# Patient Record
Sex: Female | Born: 1965 | Race: White | Hispanic: No | Smoking: Former smoker
Health system: Western US, Academic
[De-identification: ages and names within clinical notes are randomized; demographics above are authoritative.]

## PROBLEM LIST (undated history)

## (undated) DIAGNOSIS — G4700 Insomnia, unspecified: Secondary | ICD-10-CM

## (undated) DIAGNOSIS — B2790 Infectious mononucleosis, unspecified without complication: Principal | ICD-10-CM

## (undated) DIAGNOSIS — E039 Hypothyroidism, unspecified: Secondary | ICD-10-CM

## (undated) DIAGNOSIS — N920 Excessive and frequent menstruation with regular cycle: Secondary | ICD-10-CM

## (undated) HISTORY — DX: Insomnia, unspecified: G47.00

## (undated) HISTORY — DX: Hypothyroidism, unspecified: E03.9

## (undated) HISTORY — DX: Excessive and frequent menstruation with regular cycle: N92.0

## (undated) HISTORY — PX: OTHER PROCEDURE: U1053

## (undated) HISTORY — DX: Infectious mononucleosis, unspecified without complication: B27.90

## (undated) HISTORY — PX: PB MYOMECTOMY 1-4 MYOMAS 250 GM/< VAGINAL APPR: 58145

## (undated) MED ORDER — CYCLOBENZAPRINE HCL 5 MG OR TABS
5.00 mg | ORAL_TABLET | Freq: Three times a day (TID) | ORAL | 0 refills | Status: AC | PRN
Start: 2022-08-02 — End: ?

## (undated) MED ORDER — LEVONORGESTREL-ETHINYL ESTRAD 0.1-20 MG-MCG OR TABS
1.00 | ORAL_TABLET | Freq: Every day | ORAL | 3 refills | Status: AC
Start: 2017-07-30 — End: ?

## (undated) MED ORDER — ROSUVASTATIN CALCIUM 10 MG OR TABS
10.0000 mg | ORAL_TABLET | Freq: Every day | ORAL | 3 refills | Status: AC
Start: 2021-07-19 — End: ?

## (undated) MED ORDER — SYNTHROID 100 MCG OR TABS
100.00 ug | ORAL_TABLET | ORAL | 2 refills | Status: AC
Start: 2021-09-16 — End: ?

## (undated) MED ORDER — LEVONORGESTREL-ETHINYL ESTRAD 0.1-20 MG-MCG OR TABS
1.00 | ORAL_TABLET | Freq: Every day | ORAL | Status: AC
Start: 2017-08-08 — End: ?

## (undated) MED ORDER — SYNTHROID 125 MCG OR TABS
ORAL_TABLET | ORAL | 0 refills | Status: AC
Start: 2017-01-22 — End: ?

## (undated) MED ORDER — CYCLOBENZAPRINE HCL 5 MG OR TABS
5.0000 mg | ORAL_TABLET | Freq: Three times a day (TID) | ORAL | 0 refills | Status: AC | PRN
Start: 2019-08-03 — End: ?

## (undated) MED ORDER — SYNTHROID 112 MCG OR TABS
ORAL_TABLET | ORAL | 0 refills | Status: AC
Start: 2017-01-22 — End: ?

## (undated) MED ORDER — LEVOTHYROXINE SODIUM 125 MCG OR TABS
ORAL_TABLET | ORAL | 2 refills | Status: AC
Start: 2019-02-03 — End: ?

## (undated) MED ORDER — DESVENLAFAXINE SUCCINATE 50 MG OR TB24
50.00 mg | ORAL_TABLET | Freq: Every day | ORAL | 3 refills | Status: AC
Start: 2020-01-01 — End: ?

## (undated) MED ORDER — DESVENLAFAXINE SUCCINATE ER 25 MG PO TB24
25.00 mg | ORAL_TABLET | Freq: Every day | ORAL | 0 refills | Status: AC
Start: 2021-11-16 — End: ?

## (undated) MED ORDER — MEBENDAZOLE 100 MG OR CHEW
CHEWABLE_TABLET | ORAL | 0 refills | Status: AC
Start: 2017-09-18 — End: ?

## (undated) MED ORDER — SYNTHROID 112 MCG OR TABS
112.00 ug | ORAL_TABLET | ORAL | 2 refills | Status: AC
Start: 2021-09-17 — End: ?

## (undated) MED ORDER — AZELASTINE HCL 137 MCG/SPRAY NA SOLN
NASAL | 0 refills | Status: AC
Start: 2022-03-05 — End: ?

## (undated) MED ORDER — SYNTHROID 112 MCG OR TABS
112.00 ug | ORAL_TABLET | ORAL | 0 refills | Status: AC
Start: 2021-12-24 — End: ?

## (undated) MED ORDER — SYNTHROID 100 MCG OR TABS
100.00 ug | ORAL_TABLET | Freq: Every day | ORAL | Status: AC
Start: 2020-04-20 — End: ?

## (undated) MED ORDER — IBUPROFEN 800 MG OR TABS
800.00 mg | ORAL_TABLET | Freq: Three times a day (TID) | ORAL | 0 refills | Status: AC | PRN
Start: 2017-10-14 — End: ?

## (undated) MED ORDER — LEVOTHYROXINE SODIUM 112 MCG OR TABS
ORAL_TABLET | ORAL | 2 refills | Status: AC
Start: 2019-02-03 — End: ?

## (undated) MED ORDER — DESVENLAFAXINE SUCCINATE ER 25 MG PO TB24
25.0000 mg | ORAL_TABLET | Freq: Every day | ORAL | 3 refills | Status: AC
Start: 2021-05-16 — End: ?

## (undated) MED ORDER — DESVENLAFAXINE SUCCINATE 50 MG OR TB24
50.0000 mg | ORAL_TABLET | Freq: Every day | ORAL | 3 refills | Status: AC
Start: 2021-05-16 — End: ?

## (undated) MED ORDER — SYNTHROID 100 MCG OR TABS
100.00 ug | ORAL_TABLET | ORAL | 0 refills | Status: AC
Start: 2021-12-24 — End: ?

## (undated) MED ORDER — LEVONORGESTREL-ETHINYL ESTRAD 0.1-20 MG-MCG OR TABS
1.00 | ORAL_TABLET | Freq: Every day | ORAL | 3 refills | Status: AC
Start: 2017-12-12 — End: ?

## (undated) MED ORDER — SYNTHROID 88 MCG OR TABS
88.00 ug | ORAL_TABLET | Freq: Every day | ORAL | 0 refills | Status: AC
Start: 2020-07-13 — End: ?

## (undated) MED ORDER — LEVOTHYROXINE SODIUM 88 MCG OR TABS
88.00 ug | ORAL_TABLET | ORAL | 3 refills | Status: AC
Start: 2023-01-02 — End: ?

## (undated) MED ORDER — DICLOFENAC SODIUM 1 % EX GEL
4.00 g | Freq: Four times a day (QID) | CUTANEOUS | 3 refills | Status: AC
Start: 2020-02-24 — End: ?

## (undated) MED ORDER — DESVENLAFAXINE SUCCINATE ER 25 MG PO TB24
ORAL_TABLET | ORAL | 0 refills | Status: AC
Start: 2021-08-21 — End: ?

---
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
## 1993-05-20 HISTORY — PX: CHOLECYSTECTOMY, LAP: 56340

## 1996-05-20 HISTORY — PX: OTHER PROCEDURE: U1053

## 2003-03-29 ENCOUNTER — Other Ambulatory Visit (INDEPENDENT_AMBULATORY_CARE_PROVIDER_SITE_OTHER): Payer: Self-pay | Admitting: Obstetrics & Gynecology

## 2003-06-10 ENCOUNTER — Other Ambulatory Visit (INDEPENDENT_AMBULATORY_CARE_PROVIDER_SITE_OTHER): Payer: Self-pay

## 2003-08-10 ENCOUNTER — Emergency Department (HOSPITAL_BASED_OUTPATIENT_CLINIC_OR_DEPARTMENT_OTHER): Admitting: Emergency Medicine

## 2003-09-19 ENCOUNTER — Other Ambulatory Visit (INDEPENDENT_AMBULATORY_CARE_PROVIDER_SITE_OTHER): Payer: Self-pay

## 2003-09-19 LAB — COMPREHENSIVE METABOLIC PANEL, BLOOD
ALT (SGPT): 26 [IU]/L (ref 10–45)
AST (SGOT): 20 [IU]/L (ref 10–45)
Albumin: 4 g/dL (ref 3.3–5.0)
Alkaline Phos: 36 [IU]/L (ref 30–130)
BUN: 13 mg/dL (ref 8–18)
Bicarbonate: 28 meq/L (ref 24–31)
Bilirubin, Tot: 1.7 mg/dL — ABNORMAL HIGH (ref <1.2)
Calcium: 9.2 mg/dL (ref 8.8–10.3)
Chloride: 104 meq/L (ref 97–107)
Creatinine: 0.9 mg/dL (ref 0.5–1.5)
Glucose: 102 mg/dL (ref 65–110)
Potassium: 3.7 meq/L (ref 3.5–5.0)
Sodium: 142 meq/L (ref 135–145)
Total Protein: 6.8 g/dL (ref 6.0–8.0)

## 2003-09-19 LAB — TSH SENSITIVE, BLOOD: TSH Sensitive: 0.63 u[IU]/mL (ref 0.35–5.50)

## 2003-09-19 LAB — CBC WITH DIFF, BLOOD
Basophils: 1 % (ref 0–2)
Eosinophils: 2 % (ref 1–3)
Hct: 37.1 % (ref 36.0–46.0)
Hgb: 13.1 g/dL (ref 12.0–16.0)
Lymphocytes: 46 % — ABNORMAL HIGH (ref 20–40)
MCH: 32.8 pg — ABNORMAL HIGH (ref 27–31)
MCHC: 35.2 % (ref 32–37)
MCV: 93.1 um3 (ref 82.0–98.0)
MPV: 10.9 fL — ABNORMAL HIGH (ref 7.4–10.4)
Monocytes: 9 % (ref 1–10)
Plt Count: 190 10*3/uL (ref 130–400)
RBC: 3.99 10*6/uL — ABNORMAL LOW (ref 4.00–5.00)
RDW: 12.3 % (ref 10–15)
Segs: 42 % — ABNORMAL LOW (ref 45–70)
WBC: 4.7 10*3/uL (ref 4.0–11.0)

## 2003-09-19 LAB — LIPID(CHOL FRACT) PANEL, BLOOD
Cholesterol: 230 mg/dL — ABNORMAL HIGH (ref 120–200)
HDL-Cholesterol: 69 mg/dL (ref ?–85)
LDL-Chol (Direct): 155 mg/dL (ref <160)
Triglycerides: 63 mg/dL (ref ?–150)

## 2004-03-15 ENCOUNTER — Ambulatory Visit (INDEPENDENT_AMBULATORY_CARE_PROVIDER_SITE_OTHER): Admitting: Obstetrics & Gynecology

## 2004-03-28 NOTE — Lab (Signed)
 SPECIMENS SUBMITTED:  Cervical (Pap) Smear     CLINICAL INFORMATION:  LMP: 03/15/2004  FINAL CYTOLOGIC INTERPRETATION:  Atypical endocervical glandular cells     SPECIMEN ADEQUACY:  Satisfactory for evaluation  The Pap smear is a screening test with an inherent low error rate. Although  a normal [negative] result is highly predictive of the absence of cervical  cancer and its precursor lesions, it does not exclude the presence of  significant disease. Results must be evaluated within the context of the  individual patient.  CONFIDENTIAL HEALTH INFORMATION: Health Care information is personal and  sensitive information. If it is being faxed to you it is done so under  appropriate authorization from the patient or under circumstances that do  not require patient authorization. You, the recipient, are obligated to  maintain it in a safe, secure and confidential manner. Re-disclosure  without additional patient consent or as permitted by law is prohibited.  Unauthorized re-disclosure or failure to maintain confidentiality could  subject you to penalties described in federal and state law. If you have  received this report or facsimile in error, please notify the [facility] immediately and destroy the received document(s).   Material reviewed and Interpreted and  Report Electronically Signed by:  Coralyn Derry. Barnett Libel M.D. [phone])  Attending Surgical Pathologist  03/28/04 08:48  Electronic Signature derived from a single  controlled access password

## 2004-05-10 ENCOUNTER — Ambulatory Visit (INDEPENDENT_AMBULATORY_CARE_PROVIDER_SITE_OTHER): Admitting: Obstetrics & Gynecology

## 2004-05-17 NOTE — Lab (Signed)
 FINAL PATHOLOGIC DIAGNOSIS:  A: Uterus, endocervix, curettage   -Mucus and benign ectocervical tissue with squamous metaplasia, see   comment.  B: Uterus, endometrium, biopsy   -Benign secretory endometrium, see comment.  COMMENT: This case was reviewed and correlated with concurrent Pap smear  ([id number], negative reactive) and prior Pap smear ([id number],  atypical endocervical glandular cells).     SPECIMEN(S) SUBMITTED:  A: Endocervical curettage  B: endometrial biopsy  CLINICAL HISTORY: 38 year old female with atypical endocervical cells on  Pap. Negative colposcopy. Last menstrual period 04/16/04. Patient is not  pregnant. Patient has had no hormone therapy. Concurrent Pap is taken.     GROSS DESCRIPTION:  A: The specimen (received in formalin, labeled with the patient's name,  medical record number and "ECC") consists of a Telfa pad and cytobrush with  attached red-tan fragments of soft tissue. The fragments are removed,  filtered into a mesh bag and measure in aggregate 1.2 x 1.0 x 0.2 cm. The  specimen is entirely submitted in cassette A1.  B: The specimen (received in formalin, labeled with the patient's name,  medical record number and "EMB") consists of multiple red-tan fragments of  soft tissue filtered into a mesh bag and measure in aggregate 2.5 x 2.0 x  0.2 cm. The specimen is entirely submitted in cassette B1.  SD/JK/jb  CONFIDENTIAL HEALTH INFORMATION: Health Care information is personal and  sensitive information. If it is being faxed to you it is done so under  appropriate authorization from the patient or under circumstances that do  not require patient authorization. You, the recipient, are obligated to  maintain it in a safe, secure and confidential manner. Re-disclosure  without additional patient consent or as permitted by law is prohibited.  Unauthorized re-disclosure or failure to maintain confidentiality could  subject you to penalties described in federal and state law. If you  have  received this report or facsimile in error, please notify the [facility] immediately and destroy the received document(s).  Vic Ripper GYN M/C [phone]   Material reviewed and Interpreted and  Report Electronically Signed by:  Ruffin Frederick. Gwyneth Sprout M.D. [phone])  Attending Surgical Pathologist  05/17/04 11:02  Electronic Signature derived from a single  controlled access password

## 2004-05-17 NOTE — Lab (Signed)
 SPECIMENS SUBMITTED:  Cervical (Pap) Smear     CLINICAL INFORMATION:  LMP: 04/16/2004 Other Clinical Findings: Repeat pap  FINAL CYTOLOGIC INTERPRETATION:  No Atypical or Malignant Cells  Reactive cellular changes     COMMENT:  Previous cytology reviewed  Previous and/or concurrent surgical material reviewed     SPECIMEN ADEQUACY:  Satisfactory for evaluation; drying artifact present.  The Pap smear is a screening test with an inherent low error rate. Although  a normal [negative] result is highly predictive of the absence of cervical  cancer and its precursor lesions, it does not exclude the presence of  significant disease. Results must be evaluated within the context of the  individual patient.  CONFIDENTIAL HEALTH INFORMATION: Health Care information is personal and  sensitive information. If it is being faxed to you it is done so under  appropriate authorization from the patient or under circumstances that do  not require patient authorization. You, the recipient, are obligated to  maintain it in a safe, secure and confidential manner. Re-disclosure  without additional patient consent or as permitted by law is prohibited.  Unauthorized re-disclosure or failure to maintain confidentiality could  subject you to penalties described in federal and state law. If you have  received this report or facsimile in error, please notify the [facility]  [facility] Department immediately and destroy the received document(s).   Material reviewed and Interpreted and  Report Electronically Signed by:  Ruffin Frederick. Gwyneth Sprout M.D. [phone])  Attending Surgical Pathologist  05/17/04 11:03  Electronic Signature derived from a single  controlled access password

## 2005-06-27 ENCOUNTER — Encounter (INDEPENDENT_AMBULATORY_CARE_PROVIDER_SITE_OTHER): Payer: Self-pay | Admitting: Obstetrics & Gynecology

## 2005-06-27 ENCOUNTER — Ambulatory Visit (INDEPENDENT_AMBULATORY_CARE_PROVIDER_SITE_OTHER): Admitting: Obstetrics & Gynecology

## 2005-08-15 ENCOUNTER — Ambulatory Visit (INDEPENDENT_AMBULATORY_CARE_PROVIDER_SITE_OTHER): Admitting: Otolaryngology

## 2005-09-13 ENCOUNTER — Ambulatory Visit (INDEPENDENT_AMBULATORY_CARE_PROVIDER_SITE_OTHER): Admitting: Audiology

## 2005-09-19 ENCOUNTER — Ambulatory Visit (INDEPENDENT_AMBULATORY_CARE_PROVIDER_SITE_OTHER): Admitting: Otolaryngology

## 2005-09-25 ENCOUNTER — Ambulatory Visit (HOSPITAL_BASED_OUTPATIENT_CLINIC_OR_DEPARTMENT_OTHER)

## 2005-10-04 ENCOUNTER — Ambulatory Visit (INDEPENDENT_AMBULATORY_CARE_PROVIDER_SITE_OTHER): Admitting: Dermatology

## 2005-10-17 ENCOUNTER — Other Ambulatory Visit (HOSPITAL_BASED_OUTPATIENT_CLINIC_OR_DEPARTMENT_OTHER): Payer: Self-pay

## 2005-11-21 ENCOUNTER — Encounter (INDEPENDENT_AMBULATORY_CARE_PROVIDER_SITE_OTHER): Admitting: Otolaryngology

## 2005-12-04 NOTE — Progress Notes (Signed)
CLINIC: [facility]    REPORT TYPE: LETTER    Dictating Practitioner: Francee Piccolo, MD    DATE OF SERVICE: 10/04/2005    REASON FOR VISIT: NEVI, PAPULE      Oct 04, 2005    Darrick Penna, M.D.  [facility]  [id number]  [place], [place] [address]    Re: Brittany [name]    Dear Dr. Cathie Hoops:    I saw your patient, Brittany [name], in Dermatology. She has a history of  numerous nevi. Many of these on the back appear to be slightly atypical.  Only three nevi met criteria for biopsy today. These were on the left  buttock, the right mid back, and mid pubic area.    She also had a pearly papule on the right nasal ala, concerning for adnexal  tumor versus basal cell carcinoma versus intradermal nevus. This was also  biopsied today.    Because of the number of nevi she has, I recommended self skin examinations  and monitoring for changes. She should return to see me in six months for  full body skin exam.    Thank you for this consultation. The full note is in the paper chart.  Feel free to contact me with any further questions.    Sincerely,        Sharolyn Douglas, M.D.                        Signature Derived From Controlled Access Password  Francee Piccolo, MD 12/04/2005 18:01        DD: 10/04/2005 DT: 10/06/2005 9:33 P DocNo.: [id number]  [id number]/[id number]    Referring Physician:  Reuel Derby MD  [facility] [facility]      Primary Care Physician:  Reuel Derby MD  [facility] [facility]      cc: Ezekiel Slocumb, M.D.   Fax Recipient   [address] [place] [address]

## 2006-03-25 ENCOUNTER — Encounter (INDEPENDENT_AMBULATORY_CARE_PROVIDER_SITE_OTHER): Admitting: Dermatology

## 2006-04-16 ENCOUNTER — Telehealth (INDEPENDENT_AMBULATORY_CARE_PROVIDER_SITE_OTHER): Payer: Self-pay

## 2006-04-16 NOTE — Telephone Encounter (Addendum)
Former Dr. Juel Burrow patient stating Dr. Cathie Hoops is her primary now.    Verbally confirmed name of Primary Care Provider: yes    What is reason for call: Upset stomach, funny feeling starting today and has not vomited.  Has been having a cold for 2 months, chills, occasional headaches.    Advise patient due to late hour patient might not receive a call back until tomorrow morning.    Confirmed Contact Number:yes    This message will be transmitted to our triage nurse, you can expect a call by the end of the working day.

## 2006-04-16 NOTE — Telephone Encounter (Signed)
RN spoke to pt. Complains of cold sx and congestion x 2 months. Today, she started with chills, mid epigastric pain, ST that has resolved. Denies fever. Complains of slight diarrhea. Denies right shoulder pain, but endorses fatigue and malaise.     States she was assigned Dr. Cathie Hoops when Dr Juel Burrow left, she just never est care.    Scheduled to see Dr. Suzan Garibaldi tomorrow at 1520 and instructed her to schedule an est care appt with Dr. Pattricia Boss on her way out. Pt in agreement and verbalized understanding.

## 2006-04-17 ENCOUNTER — Other Ambulatory Visit (INDEPENDENT_AMBULATORY_CARE_PROVIDER_SITE_OTHER): Payer: Self-pay

## 2006-04-17 ENCOUNTER — Ambulatory Visit (INDEPENDENT_AMBULATORY_CARE_PROVIDER_SITE_OTHER)

## 2006-04-17 MED ORDER — SEPTRA DS 800-160 MG OR TABS
ORAL_TABLET | ORAL | Status: AC
Start: 2006-04-17 — End: 2006-04-24

## 2006-04-17 MED ORDER — ROBITUSSIN A-C 10-100 MG/5ML OR SYRP
ORAL_SOLUTION | ORAL | Status: DC
Start: 2006-04-17 — End: 2006-05-08

## 2006-04-17 MED ORDER — AFRIN SINUS 0.05 % NA SOLN
NASAL | Status: DC
Start: 2006-04-17 — End: 2006-05-08

## 2006-04-17 NOTE — Patient Instructions (Addendum)
Pls give blood today, you will be notified if any abnormality     Please return or call immediately for worsening or persistent fever, cough, shortness of breath, facial pain, ear pain, sore throat, or chest pain.  Also return or call for any new symptoms.

## 2006-04-17 NOTE — Progress Notes (Signed)
Brittany [name] is a 40 year old female generally healthy denies chronic med problems here w/ 2 mo of intermittent uri sx and congestion, yesterday had chils, diarrhea, and epigastiric pain.  These abdominal sx have resolved as of today, she had a normal bm today and ate her usual diet.  Her uri sx have mainly been nasal congestion, postnasal drip, cough, some ST, ear pain.  She has had occasional chills over the 2 mo period but nothing consistent  No SOB or pleuritic CP.  No h/o RAD or COPD, no tob use.  She works in close proximity to several people who have had uri and gi sx.  She has not had chronic f/c/ns/wt loss.  She has had about 10# wt gain over the last yr.  No other acute c/o.  ROS - as per HPI     There is no problem list on file for this patient.   No past medical history on file.   No current outpatient prescriptions on file.     BP 118/76  Pulse 72  Temp (Src) 97.8 F (36.6 C) (Oral)  Wt 172 lb (78.02 kg)   pleasant NAD  No frontal or maxillary area TTP  TMs and EAC wnl bilaterally  Nares clear  OP w/ erythema posteriorly; no exudates or swelling; uvula midline  No cervical LAD  Lungs CTAB w/o WRR  CV RRR  Extr w/o CCE  Abd:   NABS, no HSM by percussion, no suprapubic dullness to percussion;  soft, NTND, no rebound or guarding;  Murphy's negative, no tenderness over McBurney's point, no masses appreciated; no CVAT     Encounter Diagnoses   Code Name Primary? Qualifier   . 473.9 Sinusitis      Plan: SEPTRA DS 800-160 MG OR TABS, ROBITUSSIN A-C 10-100 MG/5ML OR SYRP, AFRIN SINUS 0.05 % NA SOLN   . 558.9 Gastroenteritis      Comment:  resolved     given the chronicity of sx will rx course of abx; also will check screening labs, and f/u to advise on further care as indicated.  Return precautions given.

## 2006-04-24 ENCOUNTER — Telehealth (INDEPENDENT_AMBULATORY_CARE_PROVIDER_SITE_OTHER): Payer: Self-pay | Admitting: Internal Medicine

## 2006-05-05 ENCOUNTER — Telehealth (INDEPENDENT_AMBULATORY_CARE_PROVIDER_SITE_OTHER): Payer: Self-pay

## 2006-05-08 ENCOUNTER — Ambulatory Visit (INDEPENDENT_AMBULATORY_CARE_PROVIDER_SITE_OTHER)

## 2006-05-08 ENCOUNTER — Other Ambulatory Visit (INDEPENDENT_AMBULATORY_CARE_PROVIDER_SITE_OTHER): Payer: Self-pay

## 2006-05-08 MED ORDER — CEFUROXIME AXETIL 250 MG OR TABS
ORAL_TABLET | ORAL | Status: DC
Start: 2006-05-08 — End: 2006-08-22

## 2006-05-08 MED ORDER — CLARITIN 10 MG OR TABS
ORAL_TABLET | ORAL | Status: DC
Start: 2006-05-08 — End: 2006-08-22

## 2006-05-14 ENCOUNTER — Telehealth (INDEPENDENT_AMBULATORY_CARE_PROVIDER_SITE_OTHER): Payer: Self-pay

## 2006-05-26 ENCOUNTER — Encounter (INDEPENDENT_AMBULATORY_CARE_PROVIDER_SITE_OTHER): Admitting: Dermatology

## 2006-06-02 ENCOUNTER — Telehealth (INDEPENDENT_AMBULATORY_CARE_PROVIDER_SITE_OTHER): Payer: Self-pay | Admitting: Internal Medicine

## 2006-06-02 NOTE — Telephone Encounter (Signed)
Pt states she would like recent labs (12.20.07) mailed to her home.  Pt saw Dr Suzan Garibaldi at that time.

## 2006-06-03 NOTE — Telephone Encounter (Signed)
Labs printed and mailed to patient per request.  Matter resolved.

## 2006-06-27 ENCOUNTER — Ambulatory Visit (INDEPENDENT_AMBULATORY_CARE_PROVIDER_SITE_OTHER): Admitting: Dermatology

## 2006-07-08 ENCOUNTER — Other Ambulatory Visit (INDEPENDENT_AMBULATORY_CARE_PROVIDER_SITE_OTHER): Payer: Self-pay | Admitting: Obstetrics & Gynecology

## 2006-08-20 ENCOUNTER — Telehealth (INDEPENDENT_AMBULATORY_CARE_PROVIDER_SITE_OTHER): Payer: Self-pay

## 2006-08-20 NOTE — Telephone Encounter (Signed)
Verbally confirmed name of Primary Care Provider: yes    What is reason for call: pt seen in Dec 07 for sinusitis, pt having similar symptoms-pt may have been exposed to mono    Confirmed Contact Number:yes    This message will be transmitted to our triage nurse, you can expect a call by the end of the working day.

## 2006-08-20 NOTE — Telephone Encounter (Signed)
RN spoke to pt. States her boss was dx with Mono recently. States since December, she has been having recurrent bouts of acute sinusitis and fatigue. States she is tired all of the time. She states the fatigue has been going on since October. States she has the start of a sore throat now, but denies any other sx. Denies epigastric/abd pain, but she does endorse diarrhea. Denies fever, but has not checked. Scheduled to see Carollee Herter CNP on Friday 08/22/06 at 1300. Pt in agreement and verbalized understanding.

## 2006-08-22 ENCOUNTER — Ambulatory Visit (INDEPENDENT_AMBULATORY_CARE_PROVIDER_SITE_OTHER): Admitting: Nurse Practitioner

## 2006-08-22 ENCOUNTER — Other Ambulatory Visit (INDEPENDENT_AMBULATORY_CARE_PROVIDER_SITE_OTHER): Payer: Self-pay

## 2006-08-22 ENCOUNTER — Telehealth (INDEPENDENT_AMBULATORY_CARE_PROVIDER_SITE_OTHER): Payer: Self-pay | Admitting: Nurse Practitioner

## 2006-08-22 ENCOUNTER — Other Ambulatory Visit (INDEPENDENT_AMBULATORY_CARE_PROVIDER_SITE_OTHER): Payer: Self-pay | Admitting: Internal Medicine

## 2006-08-22 VITALS — BP 110/60 | HR 92 | Temp 97.5°F | Resp 20 | Wt 173.0 lb

## 2006-08-22 NOTE — Patient Instructions (Signed)
Stop at the lab for blood work.   Follow up if symptoms worsen or persist.

## 2006-08-22 NOTE — Telephone Encounter (Signed)
Left MSG on VM, results for mono test are positive. Fluids and rest, Tylenol for sore throat and general body aches. Symptoms to resolve in the next 4-8 weeks. Call with any questions or concerns to [phone].

## 2006-08-22 NOTE — Progress Notes (Signed)
Brittany [name] is a 40y/o seen today for c/o fatigue and malaise. States she has been sick on and off since November with recurrent bouts of acute sinusitis and general fatigue. States her boss was dx with Mono recently. Current symptoms include fatigue, malaise, sore throat, chills, pt states she has not taken her temp. States she has been sleeping through her alarm recently. Denies epigastric/abd pain, does have occasional diarrhea. States she has been experiencing a lot of stress at work lately.    No past medical history on file.  No current outpatient prescriptions on file prior to encounter.       Physical Exam:   General Appearance: healthy, alert, no distress, pleasant affect, cooperative.  Eyes:  conjunctivae and corneas clear. PERRL, EOM's intact. Fundi benign.  Ears:  normal TMs and canal.  Nose:  normal.  Mouth: normal.  Neck:  Neck supple. No adenopathy, thyroid symmetric, normal size.  Heart:  normal rate and regular rhythm, no murmurs, clicks, or gallops.  Lungs: clear to auscultation and percussion, no chest deformities noted.  Abdomen: BS normal.  Abdomen soft, non-tender.  No masses or organomegaly.    A/P  Fatigue and malaise.   -mono spot and CBC  -If mono is negative we discuss further examination of current stress and anxiety. Pt states she has an appointment to establish with Dr. Pattricia Boss in a few weeks.

## 2006-08-27 ENCOUNTER — Telehealth (INDEPENDENT_AMBULATORY_CARE_PROVIDER_SITE_OTHER): Payer: Self-pay

## 2006-08-27 NOTE — Telephone Encounter (Signed)
Pt requesting copy of lab with mono diagnosis and note from NP [place] for her workplace which states her diagnosis. Please mail to home address.

## 2006-09-11 ENCOUNTER — Encounter (INDEPENDENT_AMBULATORY_CARE_PROVIDER_SITE_OTHER): Payer: Self-pay | Admitting: Internal Medicine

## 2006-09-11 ENCOUNTER — Ambulatory Visit (INDEPENDENT_AMBULATORY_CARE_PROVIDER_SITE_OTHER): Admitting: Internal Medicine

## 2006-09-19 ENCOUNTER — Other Ambulatory Visit (INDEPENDENT_AMBULATORY_CARE_PROVIDER_SITE_OTHER): Payer: Self-pay | Admitting: Internal Medicine

## 2006-09-24 ENCOUNTER — Telehealth (INDEPENDENT_AMBULATORY_CARE_PROVIDER_SITE_OTHER): Payer: Self-pay | Admitting: Internal Medicine

## 2006-09-24 NOTE — Telephone Encounter (Signed)
Phoned patient, informed that lab results was sent in the mail. Address verified correct. Patient was informed that per Dr Danae Orleans letter. All blood test results are normal.  Patient states that she is probably undergoing relapsed of her mono. Patient with good understanding to call back is there is any other Sx. Matter resolved.  FYI to Dr Pattricia Boss.

## 2006-09-24 NOTE — Telephone Encounter (Signed)
Patient had lab work done on Friday and would like Dr. Pattricia Boss to call her back to discuss results. She says she is not feeling very well.

## 2006-12-15 ENCOUNTER — Encounter (INDEPENDENT_AMBULATORY_CARE_PROVIDER_SITE_OTHER): Payer: Self-pay | Admitting: Internal Medicine

## 2006-12-15 ENCOUNTER — Other Ambulatory Visit (INDEPENDENT_AMBULATORY_CARE_PROVIDER_SITE_OTHER): Payer: Self-pay | Admitting: Internal Medicine

## 2007-01-26 NOTE — Telephone Encounter (Signed)
Elease Hashimoto and Rosey Bath, please route this to the appropriate person.  No routing on this message.

## 2007-01-26 NOTE — Telephone Encounter (Signed)
Addressed at visit with Dr. Pattricia Boss 09/11/06.  Matter resolved.

## 2007-02-04 ENCOUNTER — Encounter (INDEPENDENT_AMBULATORY_CARE_PROVIDER_SITE_OTHER): Payer: Self-pay | Admitting: Family Medicine

## 2007-02-04 ENCOUNTER — Ambulatory Visit (INDEPENDENT_AMBULATORY_CARE_PROVIDER_SITE_OTHER): Admitting: Family Medicine

## 2007-02-04 VITALS — BP 114/62 | Ht 70.0 in | Wt 171.0 lb

## 2007-02-04 DIAGNOSIS — Z0000 Encounter for general adult medical examination without abnormal findings: Secondary | ICD-10-CM

## 2007-02-04 NOTE — Progress Notes (Signed)
 Patient Name: Brittany [name]  MRN: [id number]  DOB: Jul 18, 1965  AGE: 41 year old    Patient's last menstrual period was 01/17/2007.    Patient Active Problem List   Diagnoses Date Noted   . Health Maintenance Examination [V70.9L] 02/04/2007     Last pap 2006, has had squamous cells, repeats normal  Mammogram: 2/08, all normal  CHOL 225  HDL65  LDLCALC      143    TRIG 87  (12/15/06)  Cycles: regular q28 days, last two cycles "heavier flow" "more cramping" and 1day longer (5 instead of 4)      . Infectious Mononucleosis [075] 09/11/2006       SURGERIES:   Past Surgical History   Procedure Date   . No past surgical history        MEDICATIONS: No current outpatient prescriptions  on file.       ALLERGIES: Review of patient's allergies indicates no known allergies.    OB/GYN HISTORY: Obstetric History     G0   P0   T0   P0   A0     E0   M0   L0          FAMILY HISTORY:   Family History   Problem Relation   . Ovarian Cancer Neg Hx     but sister w/ovarian cysts age 11   . Breast Cancer Mother       SOCIAL HISTORY: History   Social History   . Marital Status: Married     Spouse Name: N/A     Number of Children: N/A   . Years of Education: N/A   Social History Main Topics   . Tobacco Use: Never   . Alcohol Use: Yes      1 bottle wine /week   . Drug Use: No   . Sexually Active: Not Currently   Other Topics Concern   . Not on file   Social History Narrative   . No narrative on file     ROS: NO fevers/ chills/ nausea/ vomiting/ abdominal pain/ diarrhea/constipation/ URI sxs/ chest pain/ SOB/ DOE/ swelling/ rashes/ HA/ irregular bleeding/ vaginal discharge/ urinary sxs/ melena/hematochezia/change in bowel mvmts    EXAMINATION:   BP 114/62  Ht 5\' 10"  (1.778 m)  Wt 171 lb (77.565 kg)  LMP 01/17/2007  HEENT/THYROID : thyroid  normal, no adenopathy, supple  BREASTS: no axiallary LAD, no lymphadenopathy, no skin changes, diffuse fibrocystic changes, no dominant masses, symmetrical; no nipple discharge, normal  CHEST/LUNGS:  normal, clear to auscultation  HEART: normal  ABDOMEN:  BS normal, non-tender, no masses, no HSM   BACK: normal, no pain  PELVIC:  VULVA/VAGINA:  normal, discharge normal and physiologic  CERVIX: normal in appearance, no lesions or masses, S-C junction readily seen, friable  UTERUS: normal sized and mid  ADNEXA: no adnexal masses or tenderness  RECTAL: not performed  EXTREMITIES: Normal and No cyanosis, clubbing    ASSESSMENT:   well woman  no contraindication to continue hormonal therapy    Plan:   Encounter Diagnoses   Code Name Primary? Qualifier   . V72.31 Routine Gynecological Examination Yes     Plan: CYTOPATH GYN     Overview of Plan: Pap:  Done    PATIENT INSTRUCTED: mammography, self breast exam, follow-up if menses remain heavy/more painful.   Heavier menses: - advised EMB/Pelvic US , pt has had in past (12/05), declines today, advised to watch cycles, if persistently heavier, or more painful, to call  for the above.  She agrees -states "it's because I'm not exercising enough, I've been through this before"  PATIENT BARRIERS TO LEARNING: none  PATIENT/FAMILY UNDERSTANDING: verbalizes    FOLLOW-UP: Return in 1 year or sooner if problems occur.  Authored by: Jacquetta Mattocks, MD

## 2007-02-10 ENCOUNTER — Encounter (INDEPENDENT_AMBULATORY_CARE_PROVIDER_SITE_OTHER): Payer: Self-pay | Admitting: Gastroenterology

## 2007-02-10 ENCOUNTER — Ambulatory Visit (INDEPENDENT_AMBULATORY_CARE_PROVIDER_SITE_OTHER): Admitting: Gastroenterology

## 2007-02-10 VITALS — BP 128/78 | HR 72 | Temp 98.9°F | Resp 12 | Wt 170.0 lb

## 2007-02-10 NOTE — Progress Notes (Signed)
This is a 41 year old female, who presents for evaluation of fatigue, mild sore throat, body aches x 2 days. Was Dx'ed via monospot w/Mono in April. Her Sx at that time improved but now she feels like she is starting with the same sort of prodrome as before. Says that she feels tired all the time and her throat has been a little sore for the past two days. Feels like she is "out of it". Appetite has been fine, no weight loss. Denies cough, CP, SOB, abd pain, N/V/D. No swollen glands that she has noticed. Denies fever but has felt some chills.      Past Medical History   Diagnosis Date    Infectious Mononucleosis          No Known Allergies.      No current outpatient prescriptions on file prior to encounter.             ROS--    Denies a history of HA, ear pain, tinnitus or aural discharge.    No nasal discharge, coryza or bleeding. No sinus pain.    Patient refers mild sore throat, no dental pain, hoarseness, dysphagia, oral or tongue lesions.    The patient denies cough, chest pain, SOB, wheezing or hemoptysis.    Patient denies any exertional chest pain, DOE, palpitations, syncope, orthopnea, edema or paroxysmal nocturnal dyspnea.    The patient denies abdominal or flank pain, anorexia, nausea or vomiting, dysphagia, or weight loss.    The patient denies swelling, numbness, tingling or weakness in the extremities.    Denies symptoms of joint swelling or back pain.    Denies swollen glands in neck, axillary or inguinal areas, no night sweats or pruritus.    The patient denies dysuria, frequency, hesitancy or hematuria.    The patient denies any symptoms of neurological impairment or TIAs; no amaurosis, diplopia, dysphasia, or unilateral disturbance of motor or sensory function. No loss of balance or vertigo.      PE--    Vital signs per nursing.    GEN: appears well, in no apparent distress.  Alert and oriented times three, pleasant and cooperative.    HEENT: PERLA, EOMI, no JVD, no LAN, neck supple, thyroid  nonpalpable    LUNGS: Chest is clear, no wheezing or rales. Normal symmetric air entry throughout both lung fields. No chest wall deformities or tenderness.    CV:  S1 and S2 normal, no murmurs, clicks, gallops or rubs. Regular rate and rhythm.     ABD: The abdomen is soft without tenderness, guarding, mass, rebound or organomegaly. Bowel sounds are normal. No CVA tenderness or inguinal adenopathy noted.    Extremities: no pedal edema, no cyanosis or clubbing.    IMP / PLAN:  Encounter Diagnoses   Code Name Primary? Qualifier    780.79 Fatigue - ? Etiology. May have a new viral URI developing vs. protracted mono. Will check cbcd, metabolic today. Will also check EBV titers. Advised symptomatic Tx with tylenol, rest, good hydration. If no improvement in the next few weeks she will f/u with PCP. Of note, TSH was wnl in May 2008. Will not repeat at this time. Yes     Plan: CBC WITH ADIFF, BLOOD, COMPREHENSIVE METABOLIC PANEL, BLOOD, EPSTEIN-BARR IGG ANTIBODY, BLOOD, EPSTEIN-BARR IGM ANTIBODY,

## 2007-02-10 NOTE — Progress Notes (Addendum)
AVS instruction given to PT with good understanding.

## 2007-02-13 ENCOUNTER — Telehealth (INDEPENDENT_AMBULATORY_CARE_PROVIDER_SITE_OTHER): Payer: Self-pay | Admitting: Internal Medicine

## 2007-02-13 NOTE — Telephone Encounter (Signed)
 Dr Myles Arvin, please advise of test results.  Thanks.

## 2007-02-13 NOTE — Telephone Encounter (Signed)
 Patient requesting to discuss her lab results when they become available, please advise.

## 2007-02-13 NOTE — Telephone Encounter (Signed)
 Blood count and chemistries are normal.  Epstein-Barr antibody titers are not back yet.

## 2007-02-16 NOTE — Telephone Encounter (Signed)
 Dr Myles Arvin, please advise of other final results for Epstein-Barr antibody titers. Thanks.

## 2007-02-16 NOTE — Telephone Encounter (Signed)
 Attempted to call the pt. by phone with the following result;  left message on machine to call back.

## 2007-02-16 NOTE — Telephone Encounter (Signed)
 Epstein-Barr IGG antibody is high with the IGM antibody being negative.  This means that there was infection with EBV in the past, though not necessarily recently.

## 2007-02-18 NOTE — Telephone Encounter (Signed)
Letter written and forwarded.

## 2007-02-18 NOTE — Telephone Encounter (Signed)
 Letter mailed. Will closed this encounter.

## 2007-02-18 NOTE — Telephone Encounter (Signed)
 Attempted to call the pt. by phone with the following result;  left message on machine to call back.  Dr Myles Arvin, can we mail patient a letter? Thanks.

## 2007-03-20 ENCOUNTER — Encounter (INDEPENDENT_AMBULATORY_CARE_PROVIDER_SITE_OTHER): Admitting: Internal Medicine

## 2007-03-23 ENCOUNTER — Ambulatory Visit (INDEPENDENT_AMBULATORY_CARE_PROVIDER_SITE_OTHER): Admitting: Internal Medicine

## 2007-03-23 MED ORDER — SINGULAIR 10 MG OR TABS
ORAL_TABLET | ORAL | Status: DC
Start: 2007-03-23 — End: 2007-04-28

## 2007-03-23 NOTE — Progress Notes (Signed)
 Patient states that one week ago she developed a URI with rhinorrhea and nasal congestion.  However several days after that she developed some nausea with no vomiting along with fatigue and occasional myalgias.  She denies any cough or fever.  She notes no other abdominal complaints and states that these symptoms occur every fall.  She notes having been placed on nasal steroids previously with no benefit.    Past Medical History   Diagnosis Date   . Infectious Mononucleosis          No meds.    No Known Allergies.    ROS--    She has noted no swollen glands in neck, no fevers, weight loss, night sweats or pruritus.    She has no nasal coryza or bleeding. No sinus pain.    She denies a history of hearing loss, ear pain, tinnitus or aural discharge.    Patient denies sore throat, dental pain, hoarseness, dysphagia, oral or tongue lesions.    The patient denies cough, chest pain, dyspnea, wheezing or hemoptysis.    The patient denies abdominal or flank pain, anorexia or vomiting, dysphagia, change in bowel habits or black or bloody stools or weight loss.    PE--    She appears well, in no apparent distress.  Alert and oriented times three, pleasant and cooperative. Vital signs are as noted by the nurse.    Nasal exam; septum midline, no deformities, nares patent, mucosa with swelling, no polyps, no bleeding.    Ear exam - both sides normal, TM intact without perforation or effusion, external canal normal. No significant ceruminosis noted.    Throat exam normal. Oral cavity, tongue, pharynx and palate have no inflammation or suspicious lesions. Tonsils - tonsils are present and normal. Teeth normal without tenderness. +cobblestoningin the a    No lymphadenopathy in the anterior or posterior neck, supraclavicular, axillary or inguinal areas. No hepato-splenomegaly noted.    Chest is clear, no wheezing or rales. Normal symmetric air entry throughout both lung fields. No chest wall deformities or tenderness.    S1 and S2  normal, no murmurs, clicks, gallops or rubs. Regular rate and rhythm. Chest is clear; no wheezes or rales. No edema or JVD.    The abdomen is soft without tenderness, guarding, mass, rebound or organomegaly. Bowel sounds are normal. No CVA tenderness or inguinal adenopathy noted.    A/P--    Encounter Diagnoses   Code Name Primary? Qualifier   . 465.9 URI (Upper Respiratory Infection)--advised symptomatic treatment.     . 477.9 Allergic Rhinitis--given the fact that her symptoms occur at this time of year each year, I believe that she may have at least a component of allergic rhinitis.  Will start Singulair  since her symptoms were not improved with Flonase  and referred to allergy clinic.      Plan: CONSULT/REFERRAL TO ALLERGY CLINIC, SINGULAIR  10 MG OR TABS

## 2007-03-26 ENCOUNTER — Telehealth (INDEPENDENT_AMBULATORY_CARE_PROVIDER_SITE_OTHER): Payer: Self-pay | Admitting: Internal Medicine

## 2007-03-26 NOTE — Telephone Encounter (Signed)
 Received fax from [employer] with note that pt's member's current coverage has expired.  Need P.A. For Singulair  and call [employer] [facility] P.A. Dept. ID # [id number]. Spoke with pharmacist P.A. Dept, states that patient does not have any active medical coverage since December.

## 2007-03-26 NOTE — Telephone Encounter (Signed)
 Phoned patient,   Verified that  Her insurance is [employer].  [facility] must have sent it to [employer].  Phoned [facility], spoke with [facility], states that patient called her this morning and informed them that they have not use [employer] Ins yet. Rx was sent to [facility] and told me to disregard this P.A. Request. Matter resolved.   FYI to Dr Myles Arvin.

## 2007-03-30 ENCOUNTER — Telehealth (INDEPENDENT_AMBULATORY_CARE_PROVIDER_SITE_OTHER): Payer: Self-pay | Admitting: Internal Medicine

## 2007-03-30 NOTE — Telephone Encounter (Signed)
 Verbally confirmed name of Primary Care Provider: yes    What is reason for call: Pt states she's still having cold symptoms and would like to know if Dr Myles Arvin can prescribe something for her.Pt states Insurance on getting allergy medication approved.    Confirmed Contact Number:yes    This message will be transmitted to our triage nurse, you can expect a call by the end of the working day.

## 2007-03-30 NOTE — Telephone Encounter (Signed)
 RN spoke to pt.  Complains of headache, nausea, runny nose, sneezing, fatigue. She has not yet started on the Singulair  due to insurance issues. States that on Friday, she developed a cough, which has since resolved. She wonders what is available OTC she can take while she waits for the Singulair  to be approved. She has tried Claritin  in the past, but nothing else. Advised to try other OTC antihistamines including Allegra and Zyrtec  to see which one works better. Advised to call back for any worsening sx including purulent nasal discharge, cough, fever, chills. Pt in agreement and verbalized understanding.

## 2007-04-27 ENCOUNTER — Telehealth (INDEPENDENT_AMBULATORY_CARE_PROVIDER_SITE_OTHER): Payer: Self-pay | Admitting: Internal Medicine

## 2007-04-27 NOTE — Telephone Encounter (Signed)
 I have attempted to contact this patient by phone with the following results: answering machine, left message to return my call.

## 2007-04-27 NOTE — Telephone Encounter (Signed)
 Verbally confirmed name of Primary Care Provider: yes    What is reason for call: Pt with URI, feeling lightheaded, sob.  Please advise.    Confirmed Contact Number:yes    This message will be transmitted to our triage nurse, you can expect a call by the end of the working day.

## 2007-04-27 NOTE — Telephone Encounter (Signed)
 RN spoke to pt.  Since 04/21/07, she has had URI sx including congestion, chest congestion and tightness in her chest. She has been sick off an on since April and doesn't feel like she completely recovers. She is taking Singulair  and OTC remedies with little results. She wonders if she should go to ENT? She gets episodes of feeling like she needs to take a deep breath, then when she does, she gets dizziness. She is not able to exercise. She has had episodes of palpitations. Denies pedal edema, doe, chest pain. Offered appt with Dr. Myles Arvin, next avail, but declined, requesting appt tomorrow, possibly with Dr Sterling Eisenmenger. Scheduled to see Dr. Sterling Eisenmenger tomorrow at 1140. Pt in agreement and verbalized understanding to arrive at least 20 minutes early for check in.

## 2007-04-28 ENCOUNTER — Encounter (INDEPENDENT_AMBULATORY_CARE_PROVIDER_SITE_OTHER): Payer: Self-pay | Admitting: Gastroenterology

## 2007-04-28 ENCOUNTER — Ambulatory Visit (INDEPENDENT_AMBULATORY_CARE_PROVIDER_SITE_OTHER): Admitting: Gastroenterology

## 2007-04-28 MED ORDER — FLONASE 50 MCG/ACT NA SUSP
NASAL | Status: DC
Start: 2007-04-28 — End: 2008-03-10

## 2007-04-28 MED ORDER — ZYRTEC 10 MG OR TABS
ORAL_TABLET | ORAL | Status: DC
Start: 2007-04-28 — End: 2008-01-29

## 2007-04-28 MED ORDER — SINGULAIR 10 MG OR TABS
ORAL_TABLET | ORAL | Status: DC
Start: 2007-04-28 — End: 2007-09-29

## 2007-04-28 MED ORDER — DIPHTHERIA-TETANUS TOXOIDS 2-5 LFU IM INJ
INJECTION | INTRAMUSCULAR | Status: AC
Start: 2007-04-28 — End: 2007-04-29

## 2007-04-28 MED ORDER — FLONASE 50 MCG/ACT NA SUSP
NASAL | Status: DC
Start: 2007-04-28 — End: 2007-04-28

## 2007-04-28 MED ORDER — ZYRTEC 10 MG OR TABS
ORAL_TABLET | ORAL | Status: DC
Start: 2007-04-28 — End: 2007-04-28

## 2007-04-28 NOTE — Progress Notes (Signed)
AVS instruction given and explained to PT with great understanding.   TD administered IM left deltoid. Tolerated well with no adverse reaction.

## 2007-04-28 NOTE — Progress Notes (Signed)
 This is a 41 -year-old female , who presents in follow up for recurrent URI Sx and fatigue. Pt reports that she has been ill on a monthly basis with nasal congestion, runny nose, and fatigue. Dx'ed with mono last spring and has not felt well since. She denies any specific c/o including fever, chills, CP, cough, SOB, DOE, wheezing, abd pain, N/V/D. She refers that she starts to feel better then she does her routine exercise or exerts herself and she then becomes ill again. The only constant c/o that she has is a persistent runny nose. She was started on singulair  last month and has not noticed any improvement with this. She refers that she simply does not feel herself and is fatigued all the time. States that she feels "spacey"    Past Medical History   Diagnosis Date   . Infectious Mononucleosis          No Known Allergies.      No current outpatient prescriptions on file prior to encounter.         ROS--    Denies fever, chills, + fatigue, weight loss    Denies HA, visual changes, tinnitus,+ rhinorrhea, no  epistaxis    No CP, palpitation, orthopnea    Denies cough, SOB, DOE, wheezing, hemoptysis    Denies abd pain, N/V/D/C, melena, hematochezia    Denies dysuria, hematuria, frequency, hesitancy    No myalgias, arthralgias    Denies any rash    No motor or sensory weakness, numbness or tingling or the extremities, tremor or paralysis    No swollen glands, night sweats, pruritus    PE--    Vital signs per nursing.    GEN: appears well, in no apparent distress.  Alert and oriented times three, pleasant and cooperative.    HEENT: PERLA, EOMI, no JVD, no LAN, oropharynx clear, nasal mucosa erythematous, boggy    LUNGS: Chest is clear, no wheezing or rales. Normal symmetric air entry throughout both lung fields.     CV:  S1 and S2 normal, no murmurs, clicks, gallops or rubs. Regular rate and rhythm.     ABD: The abdomen is soft without tenderness, guarding, mass, rebound or organomegaly. Bowel sounds are normal. No CVA  tenderness noted.    Extremities: no pedal edema, no cyanosis or clubbing.    IMP / PLAN:  Encounter Diagnoses   Code Name Primary? Qualifier   . V06.5 Need for Prophylactic Vaccination with Tetanus-Diphtheria (TD)      Plan: DIPHTHERIA-TETANUS TOXOIDS 2-5 LFU IM INJ, TD >7 YRS; IM/JET   . V07.2 Need for Prophylactic Immunotherapy      Plan: DIPHTHERIA-TETANUS TOXOIDS 2-5 LFU IM INJ, TD >7 YRS; IM/JET   .       . 780.79 Fatigue  - Etiology is unclear. Mono should have resolved by now. Further her EBV IgM was negative. In light of recurrent URIs she has arranged to see allergy and immunology. Its possible that this is allergic in nature. She is only on singulair  at this point. Will add zyrtec  and flonase  to her regimen and see if her Sx improve. Will also check routine cbcd, metabolic and HIV test. She made need immunological w/u with allergy.       Plan: CBC WITH ADIFF, BLOOD, COMPREHENSIVE METABOLIC PANEL, BLOOD, HIV 1/2 ANTIBODY, BLOOD, CBC WITH ADIFF, BLOOD, COMPREHENSIVE METABOLIC PANEL, BLOOD, HIV 1/2 ANTIBODY, BLOOD   . 477.9 Allergic Rhinitis      Plan: ZYRTEC  10 MG OR TABS, FLONASE   50 MCG/ACT NA SUSP, SINGULAIR  10 MG OR TABS, ZYRTEC  10 MG OR TABS, FLONASE  50 MCG/ACT NA SUSP

## 2007-04-30 ENCOUNTER — Other Ambulatory Visit (INDEPENDENT_AMBULATORY_CARE_PROVIDER_SITE_OTHER): Payer: Self-pay | Admitting: Gastroenterology

## 2007-04-30 ENCOUNTER — Other Ambulatory Visit (INDEPENDENT_AMBULATORY_CARE_PROVIDER_SITE_OTHER)

## 2007-04-30 ENCOUNTER — Telehealth (INDEPENDENT_AMBULATORY_CARE_PROVIDER_SITE_OTHER): Payer: Self-pay | Admitting: Gastroenterology

## 2007-04-30 NOTE — Telephone Encounter (Signed)
 Called pt to notify of negative HIV results. Has elevated bilirubin of 2.9 Have asked her to come in for further lab work. LFTs are wnl. May consider heme consult.

## 2007-05-05 ENCOUNTER — Telehealth (INDEPENDENT_AMBULATORY_CARE_PROVIDER_SITE_OTHER): Payer: Self-pay | Admitting: Internal Medicine

## 2007-05-05 NOTE — Telephone Encounter (Signed)
 Please call patient with results of recent bloodwork.

## 2007-05-05 NOTE — Telephone Encounter (Signed)
 Dr Pattricia Boss, please advise of results. Thanks.

## 2007-05-05 NOTE — Telephone Encounter (Signed)
 All blood work is normal except for an elevated total bilirubin.  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  I can review this with her at our next appointment.

## 2007-05-06 NOTE — Telephone Encounter (Signed)
 Attempted to call the pt. by phone with the following result;  left message on machine to call back.  (Need to schedule an appt with Dr Myles Arvin to discuss result).

## 2007-05-07 NOTE — Telephone Encounter (Signed)
 Patient called back,  Message from Dr Myles Arvin relayed to patient with good understanding.  Per patient,  She has an appt with  Allergy clinic on 05/22/07 and  Will be okay with appt with them.  FYI.

## 2007-05-22 ENCOUNTER — Ambulatory Visit (INDEPENDENT_AMBULATORY_CARE_PROVIDER_SITE_OTHER): Admitting: Allergy

## 2007-06-26 ENCOUNTER — Ambulatory Visit (INDEPENDENT_AMBULATORY_CARE_PROVIDER_SITE_OTHER): Admitting: Allergy

## 2007-07-16 ENCOUNTER — Other Ambulatory Visit (INDEPENDENT_AMBULATORY_CARE_PROVIDER_SITE_OTHER): Payer: Self-pay | Admitting: Internal Medicine

## 2007-08-27 ENCOUNTER — Ambulatory Visit (INDEPENDENT_AMBULATORY_CARE_PROVIDER_SITE_OTHER): Admitting: Internal Medicine

## 2007-08-27 NOTE — Progress Notes (Signed)
 The patient returns for a skin check.  She states that she usually has this done by dermatology who no longer doing it.  She has a history of multiple nevi.  She has noticed nosignificant changes in any of her nevi  .She notes that she has a strong family history of skin cancer and wishes to be checked.  She also complains of still having some feelings of being cold without any other symptoms of hypothyroidism.  She states her allergies are under good control with Flonase .  She has no other complaints.    Past Medical History   Diagnosis Date   . Infectious Mononucleosis             Current outpatient prescriptions   Medication Sig   . FLONASE  50 MCG/ACT NA SUSP 2 sprays q nostril daily   . SINGULAIR  10 MG OR TABS 1 TABLET EVERY EVENING   . ZYRTEC  10 MG OR TABS 1 TABLET DAILY         No Known Allergies  '  ROS--    She has no nasal stuffiness, discharge, coryza or bleeding. No sinus pain.    No symptoms of hypo or hyperthyroidism: no decreased or increased weight, no feeling excessively warm, no diarrhea or constipation, no undue sweatiness, anxiety or palpitations.    The patient denies cough, chest pain, dyspnea, wheezing or hemoptysis.    Patient denies any exertional chest pain, dyspnea, palpitations, syncope, orthopnea, edema or paroxysmal nocturnal dyspnea.    PE--    She appears well, in no apparent distress.  Alert and oriented times three, pleasant and cooperative. Vital signs are as noted by the nurse.    Thyroid  not palpable, not enlarged, no nodules detected.    Chest is clear, no wheezing or rales. Normal symmetric air entry throughout both lung fields. No chest wall deformities or tenderness.    S1 and S2 normal, no murmurs, clicks, gallops or rubs. Regular rate and rhythm. Chest is clear; no wheezes or rales. No edema or JVD.    I note only benign skin findings. No unusual rashes or suspicious skin lesions noted. Nails appear normal.    A/P--    Encounter Diagnoses   Code Name Primary? Qualifier   .  216.9 Benign Neoplasm of Skin, Site Unspecified--will follow.     . 780.99 Cold Sensitivity--realtor thyroid  function tests along with vitamin D  level.      Plan: TSH SENSITIVE, BLOOD, FREE THYROXINE, BLOOD, VITAMIN D , 25-HYDROXY, BLOOD   . 477.9 Allergic Rhinitis--will continue Flonase .      Plan: [facility] ACCESS CODE PROCEDURE

## 2007-08-28 ENCOUNTER — Other Ambulatory Visit (INDEPENDENT_AMBULATORY_CARE_PROVIDER_SITE_OTHER)

## 2007-08-28 ENCOUNTER — Other Ambulatory Visit (INDEPENDENT_AMBULATORY_CARE_PROVIDER_SITE_OTHER): Payer: Self-pay | Admitting: Internal Medicine

## 2007-09-15 ENCOUNTER — Ambulatory Visit (INDEPENDENT_AMBULATORY_CARE_PROVIDER_SITE_OTHER): Admitting: Gastroenterology

## 2007-09-15 ENCOUNTER — Encounter (INDEPENDENT_AMBULATORY_CARE_PROVIDER_SITE_OTHER): Payer: Self-pay | Admitting: Gastroenterology

## 2007-09-15 ENCOUNTER — Telehealth (INDEPENDENT_AMBULATORY_CARE_PROVIDER_SITE_OTHER): Payer: Self-pay | Admitting: Internal Medicine

## 2007-09-15 VITALS — BP 94/60 | HR 80 | Temp 98.4°F | Resp 16 | Wt 174.0 lb

## 2007-09-15 NOTE — Telephone Encounter (Signed)
 RN spoke to pt.  Complains of chills, fatigue, sore throat and dizziness since Sunday. She doesn't think she has fever, but does have chills. She does not have thermometer. She endorses sick contacts. She does have sinus congestion, sneezing and runny nose, but no cough. She denies recent travel or exposure to persons recently traveling to [place]. She would like to be seen just in case she was exposed to swine flu. She requested early PM appt, so scheduled at 1320 with Dr. Sterling Eisenmenger. Pt in agreement and verbalized understanding to arrive at least 20 minutes early for check in. Universal precautions reviewed.

## 2007-09-15 NOTE — Telephone Encounter (Signed)
 Verbally confirmed name of Primary Care Provider: yes    What is reason for call: Pt states she has chills, fatigue, and slight sore throat X 3d. Pt states she wants to know if she should come in for anti flu precaution.    Confirmed Contact Number:yes    This message will be transmitted to our triage nurse, you can expect a call by the end of the working day.

## 2007-09-15 NOTE — Progress Notes (Signed)
 This is a 42 year old female, who presents for a cc of fatigue and runny nose. Sx began on Sunday when she felt a chill. She then felt run down, tired with body aches and a mild sore throat. No fever or overt chills now. Had a stomach cramp develop yesterday but admits that she just started her menstrual cycle. No N/V/D. Denies any cough, CP, SOB. States that she was watching the news and became worried that this was swine flu. She has not been in [place] but states that she has been visiting her mother in the hospital and everyone was ill. She is using flonase  which helps. She has had similar Sx in the past and took the flonase  with zyrtec  and she did improve. She is not using the zyrtec  as yet.     Past Medical History   Diagnosis Date   . Infectious Mononucleosis          No Known Allergies           Current outpatient prescriptions prior to encounter   Medication Sig Dispense Refill   . FLONASE  50 MCG/ACT NA SUSP 2 sprays q nostril daily  1  3   . SINGULAIR  10 MG OR TABS 1 TABLET EVERY EVENING  30  5   . ZYRTEC  10 MG OR TABS 1 TABLET DAILY  30  3             ROS--    Denies fever, chills, weight loss    Mild frontal HA, no visual changes, tinnitus,epistaxis    No CP, palpitation, orthopnea    Denies cough, SOB, DOE, wheezing, hemoptysis    + abdominal cramps but no N/V/D/C, melena, hematochezia    Denies dysuria, hematuria, frequency, hesitancy    No myalgias, arthralgias    Denies any rash    No motor or sensory weakness, numbness or tingling or the extremities, tremor or paralysis    No swollen glands, night sweats, pruritus    PE--    Vital signs per nursing.    GEN: appears well, in no apparent distress.  Alert and oriented times three, pleasant and cooperative.    HEENT: PERLA, EOMI, no JVD, no LAN, oropharynx clear    LUNGS: Chest is clear, no wheezing or rales. Normal symmetric air entry throughout both lung fields.     CV:  S1 and S2 normal, no murmurs, clicks, gallops or rubs. Regular rate and rhythm.      ABD: The abdomen is soft without tenderness, guarding, mass, rebound or organomegaly. Bowel sounds are normal. No CVA tenderness noted.    Extremities: no pedal edema, no cyanosis or clubbing.    IMP / PLAN:  Encounter Diagnoses   Code Name Primary? Qualifier   . 465.9 URI (Upper Respiratory Infection) - Likely a simple URI. Reassurance provided that this is likely not swine flu. However we have sent rhino cultures per protocol. Advised resp and droplet precautions. She was instructed to c/w the flonase  and resume the zyrtec . Advised good hydration, tylenol  as needed.  Yes     Plan: GENERAL VIRAL CULTURE, INFLUENZA A/B RAPID DETECTION PANEL, CBC WITH ADIFF, BLOOD, CBC WITH ADIFF, BLOOD

## 2007-09-16 ENCOUNTER — Telehealth (INDEPENDENT_AMBULATORY_CARE_PROVIDER_SITE_OTHER): Payer: Self-pay | Admitting: Gastroenterology

## 2007-09-16 NOTE — Telephone Encounter (Signed)
 Spoke to pt regarding labs as below.     Results for orders placed in visit on 09/15/2007   INFLUENZA A/B RAPID DETECTION PANEL   Component Value Range   . INFLUENZA A Negative  -    . INFLUENZA B Negative  -    . SAMPLE TYPE MICRO RESPIRATORY  -    CBC WITH ADIFF, BLOOD   Component Value Range   . WBC 5.9  4.0-11.0 (1000/mm3)   . RBC 4.04  4.00-5.00 (mill/mm3)   . HGB 13.1  12.0-16.0 (gm/dL)   . HCT 37.6  36.0-46.0 (%)   . MCV 93.0  82.0-98.0 (um3)   . MCH 32.5 (*) 27-31 (pgm)   . MCHC 34.9  32-37 (%)   . RDW 12.3  10-15 (%)   . PLT COUNT 194  130-400 (1000/mm3)   . MPV 12.7 (*) 7.4-10.4 (fl)   . NEUTROPHILS 54  45-70 (%)   . LYMPHOCYTES 38  20-40 (%)   . MONOCYTES 6  1-10 (%)   . EOSINOPHILS 1  1-3 (%)   . BASOPHILS 1  0-2 (%)   . DIFF METHOD AUTO  -    INFLUENZA CULTURE/LIMITED   Component Value Range   . INFLUENZA CULTURE/LIMITED PENDING  -

## 2007-09-23 NOTE — Progress Notes (Signed)
 Addended by: Primus Brookes on: 09/23/2007 10:39 AM     Modules accepted: Letters

## 2007-09-29 ENCOUNTER — Encounter (INDEPENDENT_AMBULATORY_CARE_PROVIDER_SITE_OTHER): Payer: Self-pay | Admitting: "Endocrinology

## 2007-09-29 ENCOUNTER — Ambulatory Visit (INDEPENDENT_AMBULATORY_CARE_PROVIDER_SITE_OTHER): Admitting: "Endocrinology

## 2007-09-29 VITALS — BP 118/72 | HR 76 | Temp 98.2°F | Resp 15 | Ht 70.0 in | Wt 167.0 lb

## 2007-09-29 NOTE — Patient Instructions (Addendum)
 Vitamin D3 1000 IU once daily    OK to also have 400 IU in mineral supplement    Check vitamin D  level in 3 months

## 2007-09-29 NOTE — Progress Notes (Signed)
 Patient is a 41 yo female referred by Dr. Myles Arvin for vitamin D  deficiency and fatigue.      Patient reports that on a monthly basis she has fatigues, body aches, congestion, occ sore throat.  This occurs monthly.  Patient notices lately that she has been feeling better, but over the last two years has been feeling these symptoms.   Of note, she was diagnosed with mononucleosis 1 year ago and she feels she is just beginning to recover.      Patient was told she had a vitamin D  level of 24 by her PCP.  Patient has been taking 400 IU daily for several years. She is not in the sun much in the last 1 year. Does not regularly eat salmon  No family hx of osteoporosis. No hx of malabsorption.  No history of fractures.   Menstrual cycles are regular every 30 days, but heavy.  Last 4-5 days.    Occ stress/depression ,but never on anti-depressant.       Chief Complaint   Patient presents with   . Establish Care     LOW VITAMIN D    . Fatigue         Past Medical History   Diagnosis Date   . Infectious Mononucleosis 08/2006           Past Surgical History   Procedure Date   . No past surgical history            Family History   Problem Relation   . Ovarian Cancer Neg Hx     but sister w/ovarian cysts age 28   . Breast Cancer Mother         History   Social History   . Marital Status: Married     Spouse Name: N/A     Number of Children: N/A   . Years of Education: N/A   Occupational History   . Not on file.   Social History Main Topics   . Tobacco Use: Never   . Alcohol Use: Yes      1 bottle wine /week   . Drug Use: No   . Sexually Active: Not Currently   Other Topics Concern   . Not on file   Social History Narrative   . No narrative on file            Current outpatient prescriptions   Medication Sig Dispense Refill   . ZYRTEC  10 MG OR TABS 1 TABLET DAILY  30  3   . FLONASE  50 MCG/ACT NA SUSP 2 sprays q nostril daily  1  3   . DISCONTD: SINGULAIR  10 MG OR TABS 1 TABLET EVERY EVENING  30  5         Review of patient's  allergies indicates no known allergies.      Review of Systems -   Constitutional: + fatigue, weight loss, fever,occ night sweats.  HEENT:occ congestion   CV: no chest pain, heart palpitations  Resp: negative for cough, shortness of breath, wheezing  GI: negative for vomiting, abdominal pain, or diarrhea  GU: No dysuria, urgency, frequency, or nocturia  Muculoskeletal:none  Neuro: No dizziness, syncope,  derm: no unusual rashes, moles, pigmentation changes  Psych: stress, occ depression    Blood pressure 118/72, pulse 76, temperature 98.2 F (36.8 C), temperature source Oral, resp. rate 15, height 5\' 10"  (1.778 m), weight 167 lb (75.751 kg), last menstrual period 09/14/2007.  Gen: AAOx3 NAD  HEENT: EOMI PERRLA  Neck: negative adenopathy. thyroid  nonpalpable,   Heart: RRR no murmurs, rubs, or gallops  Lungs: CTA B/L no wheezes, rales, or ronchi  Ext: no edema. peripheral pulses intact        Results for orders placed in visit on 09/15/2007   INFLUENZA A/B RAPID DETECTION PANEL   Component Value Range   . INFLUENZA A Negative  -    . INFLUENZA B Negative  -    . SAMPLE TYPE MICRO RESPIRATORY  -    CBC WITH ADIFF, BLOOD   Component Value Range   . WBC 5.9  4.0-11.0 (1000/mm3)   . RBC 4.04  4.00-5.00 (mill/mm3)   . HGB 13.1  12.0-16.0 (gm/dL)   . HCT 37.6  36.0-46.0 (%)   . MCV 93.0  82.0-98.0 (um3)   . MCH 32.5 (*) 27-31 (pgm)   . MCHC 34.9  32-37 (%)   . RDW 12.3  10-15 (%)   . PLT COUNT 194  130-400 (1000/mm3)   . MPV 12.7 (*) 7.4-10.4 (fl)   . NEUTROPHILS 54  45-70 (%)   . LYMPHOCYTES 38  20-40 (%)   . MONOCYTES 6  1-10 (%)   . EOSINOPHILS 1  1-3 (%)   . BASOPHILS 1  0-2 (%)   . DIFF METHOD AUTO  -    INFLUENZA CULTURE/LIMITED   Component Value Range   . INFLUENZA CULTURE/LIMITED PENDING  -            A/P 42 year old female with vitamin D  deficiency (24) and fatigue    1. Vitamin D  deficiency--goal level >32  -patient will take an extra 1000 IU of vitamin D3 daily in conjunction with her 400 IU daily  supplement  -recheck level in 3 months to see if adequate  -Indepth discussion and handout given on causes, repletion methods, and possible associations of vitamin D  deficiency.    2. Fatigue  -could be a symptom of resolving mononucleosis.    -will also check ESR, iron studies as patient notes have heavy menses to check for other causes of fatigue  -Vitamin D  repletion could help with fatigue.    Appreciate the consult. Please reconsult if any additional questions or vitamin D  levels not repleted to levels>32 in 3 months    All of the patients questions were answered and patient verbalized understanding and agreement of the above plan  The past medical history, surgical history, family history, and social history were reviewed and updated  I have reviewed recent medical encounters, lab data, imaging data, and procedures

## 2007-09-30 NOTE — Progress Notes (Signed)
Addended by: Joycelyn Schmid ROSE on: 09/30/2007  9:55 AM     Modules accepted: Letters

## 2007-11-05 ENCOUNTER — Ambulatory Visit (INDEPENDENT_AMBULATORY_CARE_PROVIDER_SITE_OTHER): Admitting: Otolaryngology

## 2007-11-05 VITALS — BP 122/76 | Resp 16 | Ht 70.0 in | Wt 170.0 lb

## 2007-11-05 NOTE — Progress Notes (Signed)
 Seen for sudden hearing loss 2 years   C/o difficulty with conversation in groups  Persistent tinnitus    O/E TMs, ECs Normal, AD less than AS

## 2007-12-09 ENCOUNTER — Ambulatory Visit (INDEPENDENT_AMBULATORY_CARE_PROVIDER_SITE_OTHER): Admitting: Audiology

## 2007-12-10 ENCOUNTER — Telehealth (INDEPENDENT_AMBULATORY_CARE_PROVIDER_SITE_OTHER): Payer: Self-pay | Admitting: Otolaryngology

## 2007-12-10 NOTE — Telephone Encounter (Signed)
 Had audiogram, showed no change in hearing thresholds, but decreased speech discrimination AD. Recommend MRI of auditory nerves.    Called patient and discussed.    MRI ordered.

## 2008-01-06 ENCOUNTER — Other Ambulatory Visit (INDEPENDENT_AMBULATORY_CARE_PROVIDER_SITE_OTHER): Payer: Self-pay | Admitting: Internal Medicine

## 2008-01-06 ENCOUNTER — Telehealth (INDEPENDENT_AMBULATORY_CARE_PROVIDER_SITE_OTHER): Payer: Self-pay | Admitting: Internal Medicine

## 2008-01-06 ENCOUNTER — Ambulatory Visit (INDEPENDENT_AMBULATORY_CARE_PROVIDER_SITE_OTHER)

## 2008-01-06 VITALS — BP 106/60 | HR 92 | Temp 98.2°F | Resp 18 | Wt 163.0 lb

## 2008-01-06 MED ORDER — ASPIRIN 81 MG OR CHEW
81.00 mg | CHEWABLE_TABLET | ORAL | Status: DC | PRN
Start: 2008-01-06 — End: 2008-03-10

## 2008-01-06 MED ORDER — OMEPRAZOLE 20 MG OR CPDR
20.0000 mg | DELAYED_RELEASE_CAPSULE | Freq: Every day | ORAL | Status: DC
Start: 2008-01-06 — End: 2008-03-10

## 2008-01-06 MED ORDER — BISMUTH SUBSALICYLATE 262 MG OR CHEW
524.00 mg | CHEWABLE_TABLET | ORAL | Status: DC | PRN
Start: 2008-01-06 — End: 2008-03-10

## 2008-01-06 NOTE — Telephone Encounter (Signed)
 Spoke to patient, patient c/o abdominal pain x 1 week associated with fatigue. Rates pain about 4-5 out of ten on the pain scale, pain is located high on her abdomen and is described as a dull pain. Denies fever, nausea or vomiting. States that has intermittent chills, diarrhea and constipation x 1 week. Patient verbalized complete understanding that an appt has been scheduled for her to see Dr.Lai today at 3:00pm.

## 2008-01-06 NOTE — Telephone Encounter (Signed)
 Verbally confirmed name of Primary Care Provider: yes    What is reason for call: Pt states she has Abdomen pain and heated skin X 1w.    Confirmed Contact Number:yes    This message will be transmitted to our triage nurse, you can expect a call by the end of the working day.

## 2008-01-06 NOTE — Progress Notes (Addendum)
 Chief Complaint   Patient presents with   . Abdominal Pain     c/o 1 week intermittent epigastric pain   . Fatigue     c/o 1 week feeling fatigue, has headache, and off & on feeling hot and cold       Subjective:   Brittany [name] is a(n) 42 year old female with past medical history of allergic rhinitis, cholecystectomy, and vit D deficiency who presents with 1 week of abdominal pain, fatigue, and hot flashes. The abd pain is epigastric, nonradiating, dull, 4/10 severity, intermittent, seems to come on about 30 min after eating and lasts for a while. Never had this pain before. No known history of GERD or PUD. Also notes an acid taste in the back of her throat. Wasn't taking any NSAIDs or asprin before this started but has been taking an 81mg  asprin for pain over the past few days. Notes that stools have been looser and darker but not black. No nausea/vomiting. Drinks alcohol only occasionally and hasn't for a few weeks. She also notes around the same time she developed fatigue and has had intermittent episodes of headache with an off and on feeling of hot and cold all over. She wonders if these feelings are hot flashes. Still getting menstrual period regularly. No known history of thyroid problems. No h/o hypertension. No fevers/chills, chest pain, SOB, or leg swelling.     History:  Patient Active Problem List   Diagnoses Code   . Infectious Mononucleosis 075   . Health Maintenance Examination V70.9L   . Allergic Rhinitis 477.9AD   . Benign Neoplasm of Skin, Site Unspecified 216.9   . Cold Sensitivity 780.99AQ       Past Medical History   Diagnosis Date   . Infectious Mononucleosis 08/2006       Past Surgical History   Procedure Date   . No past surgical history        History   Social History   . Marital Status: Married     Spouse Name: N/A     Number of Children: N/A   . Years of Education: N/A   Occupational History   . Not on file.   Social History Main Topics   . Tobacco Use: Never   . Alcohol Use: Yes       1 bottle wine /week   . Drug Use: No   . Sexually Active: Not Currently   Other Topics Concern   . Not on file   Social History Narrative   . No narrative on file          Current outpatient prescriptions   Medication Sig Dispense Refill   . aspirin 81 MG chewable tablet Take 1 Tab by mouth every 4 hours as needed for Mild Pain.  100  0   . bismuth (PEPTO-BISMOL) 262 MG chewable tablet Take 2 Tabs by mouth as needed for Heartburn.  45  0   . ZYRTEC 10 MG OR TABS 1 TABLET DAILY  30  3   . FLONASE 50 MCG/ACT NA SUSP 2 sprays q nostril daily  1  3       No Known Allergies  Family History   Problem Relation   . Ovarian Cancer Neg Hx     but sister w/ovarian cysts age 59   . Breast Cancer Mother       Objective:    BP 106/60  Pulse 92  Temp(Src) 98.2 F (36.8 C) (Oral)  Resp 18  Wt 163 lb (73.936 kg)  LMP 12/27/2007  Gen: pleasant, no acute distress, alert and oriented, well appearing  Neck: no thyromegaly or thyroid nodules palpated  Heart: regular rate and rhythm, normal s1s2, no murmurs appreciated  Lungs: clear to auscultation bilaterally, no wheezes crackles or rhonchi  Abdomen: normal bowel sounds, soft, nondistended, +mild tenderness to palpation in epigastric region without rebound or guarding, no hepatomegaly, no masses, well healed laparoscopic cholecystectomy scars.   Rectal: normal sphincter tone, no masses, brown stool that's guiac negative  Ext: no edema  Neuro: grossly nonfocal    Assessment/Plan:  42 yo woman with past medical history of allergic rhinitis, cholecystectomy, and vit D deficiency who presents with 1 week of abdominal pain, fatigue, and hot flashes.     1) Epigastric pain: history and exam seem most consistent with GERD. Other possibilities include peptic ulcer disease, gastritis, gastroenteritis, or less likely pancreatitis. Already had gallbladder removed so not biliary colic.   - start on a PPI- omeprazole 20mg  po qday. Try this for a month and see how symptoms improve. If no  improvement then she may need an upper endoscopy to further evaluate  - check HPylori antibody, lipase  - patient advised to stop taking asprin for pain as this can worsen gastric irritation     2) Fatigue: if her abdominal pain was from gastroenteritis she could have fatigue from that. Could also be from her Vit D deficiency, thyroid dysfunction, or anemia. She had complained of fatigue to Dr. Sigurd Sos in 09/2007 and had a normal ESR and iron studies at the time.   - check CBC, TSH  - due for vitamin D level repeat since starting increased supplementation 3 months ago, ordered today    3) Hot flashes: doesn't seem to be perimenopausal in etiology since she is still getting regular menstrual periods. Concurrent headaches raise the possibility of something like pheochromocytoma but her BP has always been on the low side in clinic so that's probably not the cause  - check TSH  - monitor BP    RTC in 1 month with PCP to follow up GERD treatment    Patient seen and discussed with Dr. Audria Nine who agrees with plan    Cc: Patric Dykes, MD

## 2008-01-08 ENCOUNTER — Telehealth (INDEPENDENT_AMBULATORY_CARE_PROVIDER_SITE_OTHER): Payer: Self-pay

## 2008-01-08 NOTE — Telephone Encounter (Addendum)
 Results of TFTs noted. Called patient and informed her of results. Ordered a thyroid scan and gave patient information for scheduling test. Instructed her to follow up with me or PCP afterwards. Patient agrees with plan and all questions answered,

## 2008-01-08 NOTE — Telephone Encounter (Signed)
 Lab Results   Component Value Date/Time   . TSH 0.05 01/06/08  4:15 PM   . TSH <0.03 01/06/08  4:15 PM   . FREET4 3.78 01/06/08  4:15 PM   . T3 2.6 01/06/08  4:15 PM     Message re-routed to Dr. Fran Lowes, order for Thyroid uptake and scan is pending.

## 2008-01-10 NOTE — Progress Notes (Signed)
 Attending Note:    Subjective:  I reviewed the history.  Patient interviewed and examined.  History of present illness (HPI):  Abdominal Pain, Fatigue and Collect Specimen   Pt c/o 1 week of abdominal pain, fatigue, and hot flashes. She has intermittent dull epigastric pain after eating. She also notes acid taste. She has been on ASA for a few days.  She also c/o hot flashes along w/ HA and fatigue. Period is regular.  Review of Systems (ROS): As per  the resident's note.  Past Medical, Family, Social History:  As per  the resident's  note.    Objective:   I have examined the patient and I concur with the resident's exam and of note is thyroid not significantly enlarged. Lung is clear and abdomen is soft and mild epigastric ttp. No masses. .    Assessment and plan reviewed with the resident physician.  I agree with the resident's plan as documented.  For epigastric pain, likely she has GERD. Will check HP and lipase and start pt on Prilosec daily.  For hot flashes, will check TSH and Chem, CBC w. Diff. No evidence of infection at this time. unlikelt pheo w/ low BP.    See the resident's note for further details.

## 2008-01-15 ENCOUNTER — Ambulatory Visit (INDEPENDENT_AMBULATORY_CARE_PROVIDER_SITE_OTHER): Admitting: Internal Medicine

## 2008-01-15 ENCOUNTER — Telehealth (INDEPENDENT_AMBULATORY_CARE_PROVIDER_SITE_OTHER): Payer: Self-pay | Admitting: "Endocrinology

## 2008-01-15 NOTE — Progress Notes (Signed)
 The patient returns for follow-up of Graves' disease and GERD.  She recently was diagnosed with Graves' disease when her TFTs were elevated and the thyroid scan showed diffuse increased uptake compatible with Graves' disease.  She has been offered various treatments and is considering radioactive ablation.  She says that she can again used to be fatigued but without palpitations, chest pain, or other complaints.  She has lost some weight.  She states that her reflux is improved and in fact she is no longer using the omeprazole as she does not need it.  She is currently on replacement therapy for her vitamin D deficiency.    Past Medical History   Diagnosis Date   . Infectious Mononucleosis 08/2006            Current outpatient prescriptions   Medication Sig   . ZYRTEC 10 MG OR TABS 1 TABLET DAILY   . aspirin 81 MG chewable tablet Take 1 Tab by mouth every 4 hours as needed for Mild Pain.   . bismuth (PEPTO-BISMOL) 262 MG chewable tablet Take 2 Tabs by mouth as needed for Heartburn.   Marland Kitchen omeprazole (PRILOSEC) 20 MG capsule Take 1 Cap by mouth daily.   Marland Kitchen FLONASE 50 MCG/ACT NA SUSP 2 sprays q nostril daily         No Known Allergies    ROS--    No other symptoms hyperthyroidism:  no feeling cold/chilly or excessively warm, no diarrhea or constipation, no undue sweatiness, anxiety or palpitations.    The patient denies cough, chest pain, dyspnea, wheezing or hemoptysis.    Patient denies any exertional chest pain, dyspnea, palpitations, syncope, orthopnea, edema or paroxysmal nocturnal dyspnea.    The patient denies abdominal or flank pain, anorexia, nausea or vomiting, dysphagia, change in bowel habits or black or bloody stools or weight loss.    The patient denies swelling, numbness, tingling or weakness in the extremities.    PE--    She appears well, in no apparent distress.  Alert and oriented times three, pleasant and cooperative. Vital signs are as noted by the nurse.    Thyroid enlarged, no nodules  detected.    Chest is clear, no wheezing or rales. Normal symmetric air entry throughout both lung fields. No chest wall deformities or tenderness.    S1 and S2 normal, no murmurs, clicks, gallops or rubs. Regular rate and rhythm. Chest is clear; no wheezes or rales. No edema or JVD.    The abdomen is soft without tenderness, guarding, mass, rebound or organomegaly. Bowel sounds are normal. No CVA tenderness or inguinal adenopathy noted.    Extremities: extremities, peripheral pulses and reflexes normal, no edema, redness or tenderness in the calves or thighs, feet normal, good pulses, normal color, temperature and sensation.  Results for orders placed in visit on 01/06/2008   TSH SENSITIVE, BLOOD   Component Value Range   . TSH SENSITIVE <0.03 (*) 0.35-5.50 (uIU/mL)   VITAMIN D, 25-HYDROXY, BLOOD   Component Value Range   . VITAMIN D, 25-HYDROXY 39  30-80 (ng/mL)   TOTAL T3, BLOOD   Component Value Range   . T3 TOTAL 2.6 (*) 0.8-2.0 (ng/mL)   FREE THYROXINE, BLOOD   Component Value Range   . FREE T4 3.78 (*) 0.93-1.70 (ng/dL)         A/P--    Encounter Diagnoses   Code Name Primary? Qualifier   . 242.00 Grave's Disease--the patient will make another appointment with Dr. Sigurd Sos to further discuss  her treatment options and to arrange for radioactive ablation if she should choose this method of treating her Graves' disease.     . 530.81 GERD (Gastroesophageal Reflux Disease)--patient is without symptoms.  Will follow.     . 268.9 Vitamin D Deficiency--continue current dose of vitamin D.

## 2008-01-15 NOTE — Telephone Encounter (Signed)
 Patient has been referred to see Dr. Pernell Brain for Graves Disease and was advised to see MD ASAP.  Patient is requesting an appointment next week to be started on therapy.  Please advise.  Thanks.

## 2008-01-16 NOTE — Telephone Encounter (Signed)
Yes, pls use two 20's to schedule this week ASAP

## 2008-01-18 NOTE — Telephone Encounter (Signed)
Matter resolved, please close.

## 2008-01-18 NOTE — Telephone Encounter (Signed)
 Called patient, left message to return my call to confirm 01/21/08 11:40am appointment (ok per Dr. Pernell Brain).

## 2008-01-18 NOTE — Telephone Encounter (Signed)
 Pt returning Cari's call - I confirmed appt.  She is aware.  Please call if additional information is needed to be given.

## 2008-01-21 ENCOUNTER — Ambulatory Visit (INDEPENDENT_AMBULATORY_CARE_PROVIDER_SITE_OTHER): Admitting: "Endocrinology

## 2008-01-21 MED ORDER — NORTHYX 20 MG OR TABS
20.0000 mg | ORAL_TABLET | Freq: Every day | ORAL | Status: DC
Start: 2008-01-21 — End: 2008-03-10

## 2008-01-21 MED ORDER — ACETAMINOPHEN 325 MG PO TABS
500.00 mg | ORAL_TABLET | ORAL | Status: DC | PRN
Start: ? — End: 2008-03-10

## 2008-01-21 NOTE — Progress Notes (Signed)
 Patient is a 42 yo female referred by Patric Dykes, MD for diagnosis and treatment of Grave's hyperthyroidism    Patient is a 42 yo female who noticed over the last few months that her heart was racing, she had difficulty sleeping, occ headaches.  Slight weight loss of 8 lbs.  No hair loss. +heat intolerance. +increased tremors in her hands.  Was found to have TFT's in hyperthyroid picture.  TSH<0.03 and Free T4 was elevated.  T3 was elevated as well. Pateint was sent for thyroid uptake and scan which was c/w Grave's hyperthyroidism.  No family history of thyriod. Patient currently not on treatment.    No vision changes, no diplopia.  Not taking any more supplements with iodine.  Taking 1000 mg calcium, 1000 IU vitamin D.   Patient has not been placed on any medication, nor has she been on a B-blocker.  Patient is here today to discuss treatment options.  Of note, patient's TSH was normal in February.      Chief Complaint   Patient presents with   . Follow-up     low vitamin D level- GRAVES disease.           Past Medical History   Diagnosis Date   . Infectious Mononucleosis 08/2006         Past Surgical History   Procedure Date   . No past surgical history          Family History   Problem Relation   . Ovarian Cancer Neg Hx     but sister w/ovarian cysts age 61   . Breast Cancer Mother       History   Social History   . Marital Status: Married     Spouse Name: N/A     Number of Children: N/A   . Years of Education: N/A   Occupational History   . Not on file.   Social History Main Topics   . Tobacco Use: Never   . Alcohol Use: Yes      1 bottle wine /week   . Drug Use: No   . Sexually Active: Not Currently   Other Topics Concern   . Not on file   Social History Narrative   . No narrative on file          Current outpatient prescriptions   Medication Sig Dispense Refill   . acetaminophen (TYLENOL) 325 MG tablet Take 500 mg by mouth every 4 hours as needed for Mild Pain.       . methimazole (TAPAZOLE) 20 MG tablet  Take 1 Tab by mouth daily.  30  3   . bismuth (PEPTO-BISMOL) 262 MG chewable tablet Take 2 Tabs by mouth as needed for Heartburn.  45  0   . ZYRTEC 10 MG OR TABS 1 TABLET DAILY  30  3   . FLONASE 50 MCG/ACT NA SUSP 2 sprays q nostril daily  1  3   . aspirin 81 MG chewable tablet Take 1 Tab by mouth every 4 hours as needed for Mild Pain.  100  0   . omeprazole (PRILOSEC) 20 MG capsule Take 1 Cap by mouth daily.  30  0       Review of patient's allergies indicates no known allergies.      Review of Systems -   Constitutional: + fatigue,no  weight loss, fever, night sweats.  HEENT: none   CV: no chest pain,occ heart palpitations  Resp: negative for cough,  shortness of breath, wheezing  GI: negative for vomiting, abdominal pain, or diarrhea  GU: No dysuria, urgency, frequency, or nocturia  Muculoskeletal:none  Neuro: No dizziness, syncope,      Blood pressure 110/72, pulse 72, resp. rate 15, height 5\' 10"  (1.778 m), weight 166 lb (75.297 kg).  Gen: AAOx3 NAD  HEENT: EOMI PERRLA. No lid lag.  +slight protrusion of L eye>R  Neck: negative adenopathy. thyroid approx 25 grams in size, nontender to palpation, no bruit. No nodules palpated in thyroid  Heart: RRR no murmurs, rubs, or gallops  Lungs: CTA B/L no wheezes, rales, or ronchi  Abd: +BS soft. NT/ND. no hepatosplenomegaly  Ext: no edema. peripheral pulses intact. slight tremor B/L hands  Neuro: CNII-XII intact.   MS: 5/5 B/L UE and LE  sensation: 5/5 in upper and lower extremities  DTR's: 2/4 B/L KJ, triceps, Achilles      Results for orders placed in visit on 01/06/08   TSH SENSITIVE, BLOOD   Component Value Range   . TSH SENSITIVE <0.03 (*) 0.35-5.50 (uIU/mL)   VITAMIN D, 25-HYDROXY, BLOOD   Component Value Range   . VITAMIN D, 25-HYDROXY 39  30-80 (ng/mL)   TOTAL T3, BLOOD   Component Value Range   . T3 TOTAL 2.6 (*) 0.8-2.0 (ng/mL)   FREE THYROXINE, BLOOD   Component Value Range   . FREE T4 3.78 (*) 0.93-1.70 (ng/dL)         A/P 42 year old female with newly  diagnosed hyperthyroidism secondary to Grave's disease.    Plan:    1.Discussed treatment options of hyperthyroidism with patient: including oral anti-thyroid agents, I 131 therapy, and surgery (though not typically an option)  Patient and I discussed that given the normal size of her thyroid, and age she has a probable better chance of remission with anti-thyroid agents as the literature has shown. However, she is aware that I 131 treatment is definitive therapy and anti-thyroid agents on average offer a 50% chance of remission with a 25% chance of recurrence after remission.  Patient elects to start methimazole 20 mg once daily. We will recheck TFT's in 6 weeks - 2 months to ensure that this is the correct dose. Patient will continue on methimazole for 18 months and we will monitor her TFT's regularly.   Discussed side effects of methimazole in great detail: patient understands that if she experiences any yellow discoloration or white stools, or abdominal pain to stop medication immediately and call us . Also if she develops sore throat, fever to go to ER or call office immediately because of side effect of neutropenia.  Other side effects could include arthritis or skin reactions. Patient made aware of all side effects of methimazole    Patient will return in 6 weeks for followup    All of the patients questions were answered and patient verbalized understanding and agreement of the above plan  The past medical history, surgical history, family history, and social history were reviewed and updated  I have reviewed recent medical encounters, lab data, imaging data, and procedures

## 2008-01-24 ENCOUNTER — Encounter (INDEPENDENT_AMBULATORY_CARE_PROVIDER_SITE_OTHER): Payer: Self-pay | Admitting: "Endocrinology

## 2008-01-26 ENCOUNTER — Telehealth (INDEPENDENT_AMBULATORY_CARE_PROVIDER_SITE_OTHER): Payer: Self-pay | Admitting: "Endocrinology

## 2008-01-26 ENCOUNTER — Other Ambulatory Visit (INDEPENDENT_AMBULATORY_CARE_PROVIDER_SITE_OTHER)

## 2008-01-26 ENCOUNTER — Other Ambulatory Visit (INDEPENDENT_AMBULATORY_CARE_PROVIDER_SITE_OTHER): Payer: Self-pay | Admitting: "Endocrinology

## 2008-01-26 NOTE — Telephone Encounter (Signed)
 Order place per physician last visit note.  Please sign standing order for patient.

## 2008-01-26 NOTE — Telephone Encounter (Signed)
 Patient stating on AVS advised if she has continued sx of fever, sore throat, to have a CBC done.  Patient requesting standing order.  Please advise.  Thanks.

## 2008-01-27 NOTE — Telephone Encounter (Signed)
 Spoke to pt and she states that she has been having off and on sx of sweating after taking her Methimazole 20 MG on Thursday Sep 3. Pt states she's had theses sx even before she has taken medication. I advised the pt to continue to monitor her sx and if it continues through out the weekend. To call us  to make an appt to see Dr. Pernell Brain to discuss. Pt verbalizes and understands.     Pt is now requesting lab results done on 01-26-2008.

## 2008-01-27 NOTE — Telephone Encounter (Signed)
 Tried to call pt back to get more info, but there was no answer.Left message stating to call me back in the clinic.

## 2008-01-27 NOTE — Telephone Encounter (Signed)
 From: [name]   To: [name]   Sent: Sun Jan 24, 2008 7:30 PM   Subject: 1-Non Urgent Medical Advice    Hello Dr. Pernell Brain,    I've been taking Methimazole (Tapazole) for Graves' disease each night since I saw you on Thursday. I take it at ~7:00pm, and after about 2 hours I start to sweat a lot. Is that one of the common side effects? It's not listed on the info sheet I got with the medication. Thank you for the [place] [facility] [facility] article - that was very helpful.    Best regards,  Ennifer Fotopoulos

## 2008-01-29 ENCOUNTER — Other Ambulatory Visit (INDEPENDENT_AMBULATORY_CARE_PROVIDER_SITE_OTHER): Payer: Self-pay | Admitting: Gastroenterology

## 2008-01-29 MED ORDER — CETIRIZINE HCL 10 MG OR TABS
10.0000 mg | ORAL_TABLET | Freq: Every day | ORAL | Status: DC
Start: 2008-01-29 — End: 2008-03-10

## 2008-01-29 NOTE — Telephone Encounter (Signed)
 Refill request routed to PCP.  Last office visit: 01/16/2008

## 2008-01-29 NOTE — Telephone Encounter (Signed)
 From: [name]   To: Sherley Distad   Sent: Fri Jan 29, 2008 12:30 PM   Subject: Medication Renewal Request    Original authorizing provider: Tenna Fees Arville Bis would like a refill of the following medications:  ZYRTEC 10 MG OR TABS Arnetta Lank Dunning [place]]    Preferred pharmacy: [facility]    Comment:      Medication renewals requested in this message routed to other providers:

## 2008-02-02 ENCOUNTER — Encounter (INDEPENDENT_AMBULATORY_CARE_PROVIDER_SITE_OTHER): Payer: Self-pay | Admitting: "Endocrinology

## 2008-02-02 NOTE — Telephone Encounter (Signed)
 From: [name]   To: [name]   Sent: Tue Feb 02, 2008 12:54 PM   Subject: 1-Non Urgent Medical Advice    Hello Dr. Pernell Brain,    I have a few more questions about my current diagnosis of Graves' disease. I started on 20mg  of Methimazole on September 3.     1) I've had a persistent sore throat, fatigue, weakness, sneezing/runny nose since last week. I took my prescription allergy medicine (Zyrtec) on Friday, but it made me dizzy and gave me a severe headache - before I started taking Methimazole that didn't happen. Are there any over-the-counter medicines you would suggest I take to relieve these symptoms?    2) I came in last Tuesday (9/8) for a blood count since I've had a minor sore throat since starting Methimazole. Your nurse replied that the results were normal (see below), but I compared it to the last blood count I had on April 28, and noticed the WBC was lower (5.0 compared to 5.9) - is that a significant drop?    3) The lab technician requested a standing order for the blood count test.    4) I gave your receptionist FMLA forms yesterday that my employer has asked to have filled out. Please fax the completed form to the person and number indicated on the front of the form by the end of this week if possible.    Are all of the above concerns a normal adjustment to the medication? I have an appointment with you on September 29 if needed to discuss this.    Thank you.  Terance Felt      -------------------------  To:  [name]  From:  Tobi Fortes Hofflich  Received:  01/27/2008 12:28 PM  Hi you blood count results are normal.     It may be related to the change in metabolism due to starting the methimazole. Is it bothersome? How long does it last? How long total have you been on the metimzole. This will hopefully be something that will not last and it is just due the changing metabolic state as your thyroid begins to normalize. Please make an appt to see me if this is persisting  and continuing.   tks  Heather    ----- Message -----   From: [name]   Sent: 01/24/08 07:30 PM   To: Tobi Fortes Hofflich  Subject: 1-Non Urgent Medical Advice    Hello Dr. Pernell Brain,    I've been taking Methimazole (Tapazole) for Graves' disease each night since I saw you on Thursday. I take it at ~7:00pm, and after about 2 hours I start to sweat a lot. Is that one of the common side effects? It's not listed on the info sheet I got with the medication. Thank you for the [place] [facility] article - that was very helpful.    Best regards,  Nelida Helmkamp

## 2008-02-02 NOTE — Telephone Encounter (Signed)
 Please see if patient can see me at 3pm on September 21st to discuss below issues. tks    Or 9:40AM

## 2008-02-03 ENCOUNTER — Telehealth (INDEPENDENT_AMBULATORY_CARE_PROVIDER_SITE_OTHER): Payer: Self-pay | Admitting: Internal Medicine

## 2008-02-03 ENCOUNTER — Encounter (INDEPENDENT_AMBULATORY_CARE_PROVIDER_SITE_OTHER): Payer: Self-pay | Admitting: Gastroenterology

## 2008-02-03 ENCOUNTER — Ambulatory Visit (INDEPENDENT_AMBULATORY_CARE_PROVIDER_SITE_OTHER): Admitting: Gastroenterology

## 2008-02-03 LAB — HEMOGRAM, BLOOD
Hct: 37.4 % (ref 36.0–46.0)
Hgb: 12.9 gm/dL (ref 12.0–16.0)
MCH: 30.8 pg (ref 27–31)
MCHC: 34.5 % (ref 32–37)
MCV: 89.3 um3 (ref 82.0–98.0)
MPV: 11.3 fl — ABNORMAL HIGH (ref 7.4–10.4)
Plt Count: 225 10*3/uL (ref 130–400)
RBC: 4.19 10*6/uL (ref 4.00–5.00)
RDW: 12.5 % (ref 10–15)
WBC: 5.9 10*3/uL (ref 4.0–11.0)

## 2008-02-03 MED ORDER — OSELTAMIVIR PHOSPHATE 75 MG OR CAPS
75.0000 mg | ORAL_CAPSULE | Freq: Two times a day (BID) | ORAL | Status: DC
Start: 2008-02-03 — End: 2008-03-10

## 2008-02-03 NOTE — Telephone Encounter (Signed)
 Phoned patient,  States that she has sore throat that started yesterday.   Per patient,  She states  that  she started feeling bad last Monday. Denies fevers, n/v.  Has chills,  little cough with irritated throat,  She rates the pain as 5/10.  Has  LBM  4 times yesterday,  One time since this morning which is light brown loose stool. States that " yesterday seems worst".  States that she have not eaten anything yet this morning.  Per patient, she states that she is taking new medication Methimazole 20 mg daily which was prescribed by Dr Pernell Brain for her Graves Disease.   Was informed that this medication might cause some sorethroat.  Would like to be tested for  H1N1? Appt made to see Dr Sterling Eisenmenger this morning at 11:20.  Patient  confirmed  appt with good understanding to call back if Sx worsens.

## 2008-02-03 NOTE — Progress Notes (Signed)
 This is a 42 yo female seen for a cc of fatigue and sore throat x 3 days. States that she began on Monday with fatigue, sore throat and runny nose. This has persisted and yesterday she developed some HA and some watery diarrhea. No blood or mucous. No abdominal pain. Mildly nauseated but has not vomited. Denies melena or hematochezia.  Has chills and feels flushed with what she reports as low grade temps of 99. Has a mild dry cough. No night sweats or hemoptysis. Multiple sick contacts at work. No recent travel. Has diffuse body aches and feels weak.     Past Medical History   Diagnosis Date   . Infectious Mononucleosis 08/2006         No Known Allergies       Current outpatient prescriptions prior to encounter   Medication Sig Dispense Refill   . cetirizine (ZYRTEC) 10 MG tablet Take 1 Tab by mouth daily.  30  3   . acetaminophen (TYLENOL) 325 MG tablet Take 500 mg by mouth every 4 hours as needed for Mild Pain.       . methimazole (TAPAZOLE) 20 MG tablet Take 1 Tab by mouth daily.  30  3   . aspirin 81 MG chewable tablet Take 1 Tab by mouth every 4 hours as needed for Mild Pain.  100  0   . bismuth (PEPTO-BISMOL) 262 MG chewable tablet Take 2 Tabs by mouth as needed for Heartburn.  45  0   . omeprazole (PRILOSEC) 20 MG capsule Take 1 Cap by mouth daily.  30  0   . FLONASE 50 MCG/ACT NA SUSP 2 sprays q nostril daily  1  3       ROS--    Denies weight loss    + frontal HA, no visual changes, tinnitus,  epistaxis    No CP, palpitation, orthopnea    Denies cough, SOB, DOE, wheezing, hemoptysis    Denies dysuria, hematuria, frequency, hesitancy    Denies any rash    No motor weakness or  sensory deficit, no numbness or tingling of the extremities, no tremor or paralysis    No swollen glands, night sweats, pruritus    PE--    Vital signs per nursing.    GEN: appears sick, in no apparent distress.  Alert and oriented times three, pleasant and cooperative.    HEENT: PERLA, EOMI, no JVD, no LAN, oropharynx clear, + clear  nasal secretions    LUNGS: Chest is clear, no wheezing or rales. Normal symmetric air entry throughout both lung fields.     CV:  S1 and S2 normal, no murmurs, clicks, gallops or rubs. Regular rate and rhythm.     ABD: The abdomen is soft without tenderness, guarding, mass, rebound or organomegaly. Bowel sounds are normal. No CVA tenderness noted.    Extremities: no pedal edema, no cyanosis or clubbing.    Skin: flush, feels warm    IMP / PLAN:    Encounter Diagnoses   Code Name Primary? Qualifier   . 465.9 URI (Upper Respiratory Infection) - Rapid flu sent with viral Cx. She meets criteria for Tx with tamiflu which we will start today. No work x 7 days. Advised fluids, rest, good hand washing and cough protection. ER should she worsen which she understands.       Plan: INFLUENZA A/B ANTIGEN, GENERAL VIRAL CULTURE, OSELTAMIVIR PHOSPHATE 75 MG OR CAPS   . 787.91 Diarrhea - as above.  Plan: INFLUENZA A/B ANTIGEN

## 2008-02-03 NOTE — Telephone Encounter (Signed)
 Noted, thanks!

## 2008-02-03 NOTE — Telephone Encounter (Signed)
 Verbally confirmed name of Primary Care Provider: yes    What is reason for call: Pt states she has chills,HA, diarrhea, cough, etc. Pt states she would like to have H1N1 test.    Confirmed Contact Number:yes    This message will be transmitted to our triage nurse, you can expect a call by the end of the working day.

## 2008-02-03 NOTE — Telephone Encounter (Signed)
 There was an opening for tomorrow Sep 17 at 11:20 AM. So I put pt in that slot is that ok?

## 2008-02-03 NOTE — Telephone Encounter (Signed)
 Patient requesting 6 months medical leave for hyperthyoridism.   I explained to patient that hyperthyoridism is very common and her sx will probably resolve in the next few weeks or subside.  I will give her off for the first 6 weeks of diagnosis, and explained that her thyrodi should be regulated over the next few weeks    Will give FMLA from  01/06/08-03/03/08    ROSE---PLEASE CANCEL PT TOMORROW, she has flu.  Will see me 10/22 for followup

## 2008-02-03 NOTE — Progress Notes (Signed)
 Addended by: Elberta Grebe on: 02/03/2008 12:03 PM     Modules accepted: Letters

## 2008-02-04 ENCOUNTER — Telehealth (INDEPENDENT_AMBULATORY_CARE_PROVIDER_SITE_OTHER): Payer: Self-pay | Admitting: Internal Medicine

## 2008-02-04 ENCOUNTER — Encounter (INDEPENDENT_AMBULATORY_CARE_PROVIDER_SITE_OTHER): Admitting: "Endocrinology

## 2008-02-04 NOTE — Telephone Encounter (Signed)
 Yes, I will give them to you tks

## 2008-02-04 NOTE — Telephone Encounter (Signed)
 Appt cx for today. Did you need me to fax pt FMLA papers?

## 2008-02-04 NOTE — Telephone Encounter (Signed)
 Left message informing pt that FMLA papers signed and faxed to Ltanya Rummer at [phone] per pt request.

## 2008-02-05 ENCOUNTER — Ambulatory Visit (INDEPENDENT_AMBULATORY_CARE_PROVIDER_SITE_OTHER): Admitting: Allergy

## 2008-02-12 ENCOUNTER — Other Ambulatory Visit (INDEPENDENT_AMBULATORY_CARE_PROVIDER_SITE_OTHER): Payer: Self-pay | Admitting: Emergency Medicine

## 2008-02-12 ENCOUNTER — Emergency Department: Payer: Self-pay

## 2008-02-12 ENCOUNTER — Other Ambulatory Visit: Payer: Self-pay

## 2008-02-12 ENCOUNTER — Telehealth (INDEPENDENT_AMBULATORY_CARE_PROVIDER_SITE_OTHER): Payer: Self-pay | Admitting: "Endocrinology

## 2008-02-12 ENCOUNTER — Encounter (INDEPENDENT_AMBULATORY_CARE_PROVIDER_SITE_OTHER): Payer: Self-pay | Admitting: "Endocrinology

## 2008-02-12 NOTE — Telephone Encounter (Signed)
 RN spoke to Pt. Patient states that she is having left arm tingling this morning which extends to 3 fingers on left hand, also feels tingly on left side of the face and tongue. She also states that she feels weak and "out of it" today. Pt started methimazole (TAPAZOLE) 20 MG daily on 01/21/08. She is wondering if this is side effect of Tapazole. Denies f/c/n/v, shortness of breath, chest pain, abdominal pain, diarrhea, rash. She had a cold and runny nose 1 week ago and was given Tamiflu. She endorses that she still has intermittent dry cough and headache (4/10 pain scale). Denies hx of CAD. Patient states that she is having tachycardia every night since she was diagnosed with Hyperthyroidism last Aug 19. PT ADVISED TO GO TO THE ER FOR EVAL AND TX.  PT VERBALIZED FULL UNDERSTANDING WHY SHE NEEDS IMMEDIATE TX.  WILL GO NOW TO THE [place] ER.

## 2008-02-12 NOTE — Telephone Encounter (Addendum)
 Noted, patient was sent to ER, pls see note. Thanks.

## 2008-02-12 NOTE — Telephone Encounter (Signed)
 Patient needs to get evaluated today.  Tried alling ptaient to see how she is doing. Left message advising ER or to be seen today. tks

## 2008-02-12 NOTE — Telephone Encounter (Signed)
 Verbally confirmed name of Primary Care Provider: yes    What is reason for call: Pt is having lt arm tingling, 3 fingers on lt hand tingly since this AM, just feels weak and "out of it" today. Pt started methimazole (TAPAZOLE) 20 MG 1qd on 01/21/08. Wondering if this is side effect?      Confirmed Contact Number:yes    This message will be transmitted to our triage nurse, you can expect a call by the end of the working day.

## 2008-02-13 LAB — ECG, COMPLETE (HC/~~LOC~~/ENCINITAS)
ECG INTERPRETATION: NORMAL
PR INTERVAL: 136 ms
QRS INTERVAL/DURATION: 78 ms
QT: 390 ms
QTc (Bazett): 427 ms
R AXIS: 25 degrees
VENTRICULAR RATE: 72 {beats}/min

## 2008-02-15 ENCOUNTER — Encounter (INDEPENDENT_AMBULATORY_CARE_PROVIDER_SITE_OTHER): Admitting: Obstetrics & Gynecology

## 2008-02-15 ENCOUNTER — Encounter (INDEPENDENT_AMBULATORY_CARE_PROVIDER_SITE_OTHER): Payer: Self-pay | Admitting: "Endocrinology

## 2008-02-15 ENCOUNTER — Encounter (HOSPITAL_BASED_OUTPATIENT_CLINIC_OR_DEPARTMENT_OTHER): Payer: Self-pay | Admitting: Gastroenterology

## 2008-02-15 NOTE — Telephone Encounter (Signed)
 FYI

## 2008-02-15 NOTE — Telephone Encounter (Signed)
 From: [name]   To: [name]   Sent: Fri Feb 12, 2008 6:37 PM   Subject: 2-Procedural Question    Hello Dr. Pernell Brain,    I just heard your message and it is too late to call back. The nurse I spoke to this morning, I think it was Dr. Claudeen Crutch nurse although I had asked for your office when I called, said I should go to the emergency room for tests after I described my symptoms and she consulted with a doctor: tingling in 3 left fingers and left arm, tingling in left side of face, headache, very tired, had the tingling since I woke up this morning. Also, I was at work and my boss said all the color had gone out of my face. So I went to [facility] and was there for a long time (they were very busy) - had an EKG and a CAT scan - normal. Bloodwork normal except for TSH and T4. The supervising doctor said it is probably an effect of my body adjusting to the Tapazole, and that I should continue to take it. Should I make an appt to see you next week just to follow up? My BP was 128/~80 when I checked in, but 108/63 when I checked out. I feel a bit lightheaded right now, but the tingling went away, and probably after I have a good meal and a good night's rest I'll feel better.    If this happens again, should I insist on speaking to you/your nurse?    Thank you for following up.    Best regards,  Brittany [name]

## 2008-02-16 ENCOUNTER — Encounter (INDEPENDENT_AMBULATORY_CARE_PROVIDER_SITE_OTHER): Payer: Self-pay | Admitting: "Endocrinology

## 2008-02-16 NOTE — ED Notes (Addendum)
 ============================== ADMIT SUMMARY ==============================    RECEIVING NURSE -   ED NURSE -     +------------------------------- ALLERGIES -------------------------------+   No known drug allergies;     +-------------------------- ADMITTING DIAGNOSIS --------------------------+       +--------------------------- ADMITTING SERVICE ---------------------------+  ADMISSION SERVICE -   LEVEL OF CARE -   ATTENDING -   RESIDENT -     +------------------------ MOST RECENT VITAL SIGNS ------------------------+  BP - 108/63 PULSE - 71   RESPIRATIONS - 18 O2 SAT - 100   TEMPERATURE - MODE -   GCS TOTAL -   PAIN - 0 PAIN QUALITY - N/A   PAIN LOCATION - n   DATE/TIME - 02/12/2008 1627    +-------------------------------- FLUIDS ---------------------------------+  DATE TIME IV FLUID L/R LOCATION SIZE HUNG ABSORBED  ---------------------------------------------------------------------------     TOTAL IV: 0 ml    TOTAL OUTPUT: 0 ml TOTAL PO: 0 ml    +------------------------------ MEDICATIONS ------------------------------+  DATE TIME MEDICATION VERIFYING RN RN INIT  ---------------------------------------------------------------------------      +------------------------------- LABS DONE -------------------------------+  ACT- MD MD RN AP +INITIALS+  IVE DATE TIME TIME TIME TREATMENT ORDERS MD RN AP   ---------------------------------------------------------------------------     09/25 1149 1158 1150 CBC WITH DIFF**, COMP METABOLIC AJM [facility] KSW   PANEL**, PT**, PTT**   Specimen Type: Blood   09/25 1149 1204 1150 TSH,SENSITIVE AJM [facility] KSW   Specimen Type: Blood  NO 09/25 1155 VBG with LYTES MRV    Specimen Type: Blood   09/25 1156 1159 1200 MAGNESIUM, T4 FREE MRV [facility] KSW   Specimen Type: Blood   09/25 1203 1204 1214 IONIZED CALCIUM MRV [facility]    Collect New Specimen   Specimen Type: Blood    EKG DONE - YES    +---------------------------- PROCEDURE NOTES ----------------------------+      +------------------------  CURRENT MEDICATION --------------------------+     TAPAZOLE 20 Milligrams QD for Continuous  +------------------------ OTHER NURSING PROCEDURES -----------------------+     +--------------------------- PSYCHOSOCIAL NEEDS --------------------------+  +------------------------- BARRIERS TO LEARNING --------------------------+    ASSESSMENT- Assessment Done with Findings of:      BARRIERS-    No Barriers  SUPPORT PERSON-      SPECIAL CONSIDERATIONS-      ============================== TRIAGE RECORD ==============================    CHIEF COMPLAINT- Stroke    TIME OF ONSET- 07:00 : +-STANDING-+ +--SEATED--+  TRIAGE CATEGORY- 2 : BP PULSE BP PULSE   ROOM- T4 : N/A/N/A N/A 128/77 86   MODE OF ARRIVAL- Car :   IN CUSTODY- No : TEMP MODE O2SAT RESP LMP   PRIVATE MD- N/A : 98.4 Oral 100 20 N/A       :   WORK RELATED INJURY- No : +--GCS--+ +--PUPILS--+  RETURN IN 72 HOURS- None : E V M TOT L R RESPONSE  TRIAGE NURSE- Rosemary Sutter : X X X N/A 4 4 PERRL     IS THIS VISIT RELATED TO ASSAULT OR DOMESTIC VIOLENCE- no   PAIN TYPE- V NOW- 0 TOLERABLE AT- 0 QUALITY- N/A      PAIN LOCATION- denies RADIATES TO- none   LATEX ALLERGY FORM- No LATEX ALLERGY- No TETANUS- N/A   IMMUNIZATION- UTD PED HEIGHT- N/A WEIGHT- N/A KG  ADDITIONAL FORMS- No   +------------------------- CARE PRIOR TO ARRIVAL -------------------------+   none  +------------------------------- ALLERGIES -------------------------------+    MEDICATION ALLERGY REACTION  ---------------------------------------------------------------------------    N/A No known drug allergies Unknown Rea    +------------------------------ MEDICATIONS ------------------------------+  MEDICATION NAME DOSAGE FREQUENCY  ---------------------------------------------------------------------------    TAPAZOLE 20 Milligrams Once a Day     +-------------------------- PAST MEDICAL HISTORY -------------------------+   hyperthyroid; recovering from course of tamiflu; choly; tonsillectomy;    irregular heart beat intermittently x 6 mos w/o w/u.  +---------------------------- CURRENT HISTORY ----------------------------+   tingling of left arm/side of face extending into and including 3rd,4th,5th   fingers of left hand.. No trauma/no muscle strains, etc. No dysarthria;   no pronator drift; clear speech;     =========================== REASSESSMENT VITALS ===========================   R T M ET    E E O CO2 PU-    S M D O2 ET TY- PIL +---GCS----+   DATE TIME BP HR P P E SAT CO2 PE L R E V M TOT POSITION      ---------------------------------------------------------------------------    09/25 1140 128/77 86 20 98.4 O 100 4 4 Seated   09/25 1140 / Standing  09/25 1210 127/88 76 16 98 Seated   09/25 1327 114/61 79 16 100 Seated   09/25 1627 108/63 71 18 100 Lying      +-----------------PAIN-----------------+   T I    Y N T N    P O O I   DATE TIME E W L LOCATION QUALITY RADIATES COMMENT T   ---------------------------------------------------------------------------    09/25 1140 V 0 0 denies N/A none Triage RBS  09/25 1140 Triage RBS  09/25 1210 V 0 4 na N/A na [facility]  09/25 1327 V 0 4 na N/A na [facility]  09/25 1627 V 0 0 n N/A no RCS    ============================= NURSE DISCHARGE =============================    DISCHARGE NURSE- Randy Shannahan   DISPOSITION- Discharged from ED WITH- By Self   ACCOMPANIED BY- N/A   Meredeth Stallion Leron Rankins-    N/A   TIME OF DISPOSITION- 02/14/2008 1627 LEFT ED VIA- Ambulate   TRANSFERRED TO- N/A REASON- N/A   ADMITTED TO- N/A ROOM- N/A   NURSE REPORT TO- N/A REPORT TIME- X N/A   BELONGINGS- N/A ENVELOPE NUMBER- N/A   CONDITION ON DISCHARGE- Stable   AFTERCARE PROVIDED WITH- Written and Verbal   WHAT AFTERCARE INSTRUCTIONS WERE GIVEN AND REVIEWED WITH PATIENT  AND/OR FAMILY?- (see EPIC instructions)   weakness   IN WHAT LANGUAGE WERE THESE GIVEN?- English OTHER: N/A   TRANSLATED BY- N/A OTHER: N/A   GCS- E: N/A V: N/A M: N/A TOTAL: N/A  PAIN LEVEL UPON DISPOSITION- 0 OUT OF 10  WHAT MEDS  WERE PROVIDED FROM DISCHARGE PYXIS?-    N/A   RX TO BE FILLED FOR- N/A   DID THE PATIENT OR RESPONSIBLE CARE PROVIDER UNDERSTAND THE FOLLOW UP  RECOMMENDATION?- Yes DISPOSITION BY- RN   NURSING LEVEL- 3. ED Stay with Multiple Interactions     +------------------------------- RESTRAINTS ------------------------------+  ALTERNATIVE ATTEMPTS:    -------------------------- RESTRAINT ASSESSMENTS --------------------------  ============================== POINT OF CARE ==============================    OCCULT BLOOD STOOL RESULTS   Norm results neg.  DATE TIME RESULTS DONE BY CONTROL POSTIVE CONTROL NEGATIVE     URINE PREGNANCY TEST   Norm results for non pregnant females neg.  DATE TIME RESULTS DONE BY CONTROL POSTIVE     URINE DIP   Norm results - All neg. with pH 5.0 to 8.0 and urobili 0.2 to 1.0   LEUKO NI- PRO- GLU- URO-   DATE TIME CYTE TRITE PH TEIN COSE KETONES BILI BILI BLOOD BY     FINGER STICK GLUCOSE   Norm results 65  to 110 mg/dl  DATE TIME RESULTS DONE BY   09/25 1208 98 MKH     FINGER STICK HEMOGLOBIN   Norm results adult female 38 to 17 gm/dl  Norm results adult female 12 to 16 gm/dl  DATE TIME RESULTS DONE BY     ============================== MD NOTES H&P ===============================    TIME OF NOTE WRITTEN- N/A   CHIEF COMPLAINT- N/A     HISTORY OF PRESENT ILLNESS  09/28 0001 Alvira Philips, MD     pt seen immediately   42 y/o female with recent dx of hyperthyroid just started   methamizole 3 wk prior p/w tingling ulnar distribution left   hand and "funny feeling" to left cheek since awakening 6   hours prior. Has generalized low energy/weakness today and   yest. No focal weakness, no ataxia/facial   droop/dysarthria/dysphagia/HA. No neck pain, no   trauma/chiropractor. MRI 3 months ago for right hearing loss   neg for mass/lesions/stroke. Pt currently asymptomatic. ROS   o/w neg re: CC      PAST MEDICAL/SURGICAL HISTORY  09/28 0001 Alvira Philips, MD     per triage + hpi      FAMILY HISTORY- no MS    SOCIAL HISTORY-   SMOKING ALCOHOL ILLICIT DRUGS HOMELESS MARRIED EMPLOYED   NO NO NO NO N/A N/A  OTHER- no MS

## 2008-02-16 NOTE — Telephone Encounter (Signed)
 The final culture result remains pending. This takes a long time to come back. I'm glad you feel better! I'll let you know when the result is back.

## 2008-02-16 NOTE — ED Notes (Addendum)
=================================   ORDERS ==================================    ACT- MD MD RN AP   IVE DATE TIME TIME TIME TREATMENT ORDERS MD RN   ---------------------------------------------------------------------------     09/25 1149 1158 1150 CBC WITH DIFF**, COMP METABOLIC AJM [facility]    PANEL**, PT**, PTT**   Specimen Type: Blood   09/25 1149 1159 1150 CHEST PA, X-RAY * AJM [facility]    Rad Indication: upr extr numbness   09/25 1149 1159 1150 EKG AJ[facility]    09/25 1149 1203 1150 POC FSG AJM [facility]    09/25 1149 1203 1150 POC Urine Dip, POC UPT AJM [facility]    09/25 1149 1159 1150 IV Saline Lock, Large Caliber AJM [facility]    09/25 1149 1159 1150 Monitor AJM [facility]    09/25 1149 1159 1150 Oxygen 2 Liters/minute via Nasal AJM [facility]    [place]   09/25 1149 1204 1150 TSH,SENSITIVE AJ[facility]    Specimen Type: Blood  NO 09/25 1155 VBG with LYTES MRV    Specimen Type: Blood   09/25 1156 1159 1200 MAGNESIUM, T4 FREE MRV [facility]    Specimen Type: Blood  NO 09/25 1200 1204 1201 CANCELLED -- VBG with LYTES MRV [facility]    Specimen Type: Blood   09/25 1203 1204 1214 IONIZED CALCIUM MRV [facility]    Collect New Specimen   Specimen Type: Blood   09/25 1247 1248 1248 HEAD CT W/O CONTRAST, CAT MRV [facility]    Rad Indication: numbness left   face, left hand 7am   09/25 1611 1613 1615 Discontinue IV Line As Indicated MRV RCS    09/25 1611 1613 1615 Discharge MRV RCS

## 2008-02-16 NOTE — ED Notes (Addendum)
 REVIEW OF SYSTEMS- All other systems reviewed and are negative.     PHYSICAL EXAM  09/28 0001 Alvira Philips, MD     vs noted wnl   gen aox3,    heent no exopthalmos, eomi, no ptosis/nystagmus, perrl, neck   supple, non ttp   cv rrr   ctab   abd soft nt/nd   ext no edema   neuro ao3, cn 2-12 intact, 5/5 b/l u and l ext prox and   distal ext strength, sens to LT intact, pronator drift neg,   ftn wnl, heel-shin wnl, no ataxia. Spurling test neg. Tinel   test neg. DTR 2+ patella and BR and equal b/l.      IMPRESSION  09/28 0001 Alvira Philips, MD     42 y/o f with hyperthyroid with normal vitals, positive   symptoms of tingling in left hand, and no findings on CN or   neuro exam, normal CT head now and prior normal MRI brain.    Etiology unclear, symptoms resolved, pt has neurologist   already and good follow up. She felt ready to go.      MEDICAL DECISION MAKING  09/25 1612 Alvira Philips, MD     Please continue your medications, your TSH was very low and   your T4   is still pending. Please have your primary doc follow up on   this.   Return immediately as we discussed for worsening or   concerning    symptoms.    CASE PRESENTED TO- Budd Palmer     ============================= PHYSICIAN NOTES =============================    09/25 1206 Alvira Philips, MD     ekg: no prior for comp   sinus 72, no st elev/depression, no twi, normal RWP, normal   EKG.      ============================= PROCEDURE NOTES =============================      ================================ LAB NOTES ================================    09/25 1247 Alvira Philips, MD     chem noted,   ALT slight elev, tbili slight elev   cal and mag wnl   cbc wnl   TSH/T4 PENDING    09/25 1248 Budd Palmer, MD Attending     inr nl    09/25 1248 Alvira Philips, MD     fsg 98      ================================= IMAGES ==================================      ============================== MD DISCHARGE ===============================    DISCHARGE PHYSICIAN-  Alvira Philips   CHIEF COMPLAINT- N/A CASE PRESENTED TO- Budd Palmer   CONDITION OF DISCHARGE- Stable   WAS THIS VISIT FOR A WORK RELATED ILLNESS OR INJURY- No   PRIMARY CARE PHYSICIAN- FARBER NEIL MD   HAS PCP BEEN CONTACTED- No H&P NOTE WAS DICTATED- No     +-------------------------- DISCHARGE DIAGNOSIS --------------------------+   244.9 HYPOTHYROIDISM UNSPEC    794.5 ABNORMAL THYROID FUNCT STUDY    794.8 ABNORMAL LIVER FUNCTION STUDY    436. Stroke     +------------------------- DISCHARGE INSTRUCTIONS ------------------------+    09/25 1611 Alvira Philips, MD     PHYSICIAN- Budd Palmer, MD Attending FOLLOW-UP(DAYS)- 1 wk    APPOINTMENT- No RETURN TO- N/A LANGUAGE- English    INSTRUCTIONS-   MEDICATIONS-   REFERRAL CLINICS-   REFERRAL PHYSICIANS-   ADDITIONAL INSTRUCTIONS-   Please continue your medications, your TSH was very low and   your T4   is still pending. Please have your primary doc follow up on   this.   Return immediately as we discussed for worsening or   concerning  symptoms.      +----------------------- MEDICATION RECONCILIATION -----------------------+    ---------------------------- CURRENT MEDICATION ---------------------------  MEDICATION NAME DOSAGE FREQUENCY  ---------------------------------------------------------------------------    TAPAZOLE 20 Milligrams Once a Day     ---------------------------- STOPPED MEDICATION ---------------------------  MEDICATION NAME DOSAGE FREQUENCY  ---------------------------------------------------------------------------      ---------------------------- UPDATED MEDICATION ---------------------------  MEDICATION NAME DOSAGE FREQUENCY  ---------------------------------------------------------------------------      ----------------------------- ADDED MEDICATION ----------------------------  MEDICATION NAME DOSAGE FREQUENCY  ---------------------------------------------------------------------------        ============================= FOLLOW UP NOTES  =============================

## 2008-02-16 NOTE — Telephone Encounter (Signed)
 From: [name]   To: Sherley Distad   Sent: Mon Feb 15, 2008 5:39 PM   Subject: 20-Other    Hello Dr. Sterling Eisenmenger,    I saw you on Sept. 16 and am following up on the second swab you took for influenza. The rapid test came back negative, but I haven't seen a post of the result of the other test. I took the Tamiflu for 5 days and started to feel better on the 6th day.    Thank you.  Kourtlynn Twardowski

## 2008-02-16 NOTE — ED Notes (Addendum)
==============================   ATTENDING NOTE =============================    09/29 1535 Brittany Rhymes, MD Attending     Delayed entry of note, pt seen on time.       Patient seen and examined, H&P reviewed. Case discussed   with Dr. Tedra Fears      Key elements of history and physical exam include: 12 F h/o   hyperthyroidism c/o numbness in R hand/fingers as well as L   face x several hrs PTA. No wknss no vision chngs no HA no   other sx. No wknss. No h/o similar. No facial or uppr extr   trauma. No cp or sob. No f/c/s no neck pain. Sx seem to have   resolved at this time.       Afebrile, vital signs noted and are normal, O2 sat normal on   RA      PE: WDWN, NAD, comfortable   awake, alert   NCAT   OP moist mucous membranes, no lesions   neck supple, FROM   chest CTAB, nl resp rate   cardiac RRR, no murms   abd soft NTND, no r/g   extremities warm well perfused, no edema   skin warm dry no rashes/lesions   neuro alert follows commds, nl MS, PERRLA, EOMS full, CN   2-12 intact, str 5/5 throughout, no pronator drift, sens to   light touch intact throughout, nl FNF, nl gait      Impression: transient paresthesias in L face and LUE in a   young healthy F w/ nl neuro exam. ddx   metabolic/e-lyte/thyroid dz, vs stroke/cns, vs periph n dz.         Agree w/ resident MD A/P for labs, TFTs, CT head, serial   exams. If all neg w/u today, likely dc w/ ret precs and   close PMD f/u.

## 2008-02-19 ENCOUNTER — Encounter (INDEPENDENT_AMBULATORY_CARE_PROVIDER_SITE_OTHER): Payer: Self-pay | Admitting: "Endocrinology

## 2008-02-19 ENCOUNTER — Telehealth (INDEPENDENT_AMBULATORY_CARE_PROVIDER_SITE_OTHER): Payer: Self-pay | Admitting: "Endocrinology

## 2008-02-19 NOTE — Telephone Encounter (Signed)
**Note De-identified  [place] Obfuscation** Please advise 

## 2008-02-19 NOTE — Telephone Encounter (Signed)
 Dr. Pernell Brain did you want to see pt next week? There are no opening. The next available is 02-29-2008 Monday.

## 2008-02-19 NOTE — Telephone Encounter (Signed)
 Pt calling to schedule f/u for next week on medication issue per Dr Pernell Brain note in MyChart-no appt available, please call.

## 2008-02-19 NOTE — Telephone Encounter (Signed)
 Please add her Monday at 4:40PM

## 2008-02-19 NOTE — Telephone Encounter (Signed)
 From: [name]   To: [name]   Sent: Fri Feb 19, 2008 12:46 PM   Subject: 20-Other    Dear Dr. Pernell Brain,    I'm hesitant to increase the dosage, as I'm still having a hard time adjusting to the medication. I had nausea and stomach pains/cramps Wednesday night, [name] too ill to work yesterday, and am working from home today and feel: often dizzy when I'm not lying down, slightly nauseous, very tired. I thought my period had ended Wed, but Thursday late morning I started bleeding again, but it has stopped today, so the medication seems to also affect that.    Sincerely,  Brittany [name]    ------------  To: [name]  From: Tobi Fortes Hofflich  Received: 02/17/2008 10:13 PM  Ok sounds good, we could go up on the methimazole to 30 mg daily and see if you feel better.  Your free t4 hormone has greatly improved so you should be feeling better soon. We can add the extra 10 mg pill to your 20 mg, perhaps that will help more.  Let me know your thoughts. As I expalined before, anywhere between 20 mg-30 mg of methimazole is the standard daily dose.     ----- Message -----   From: [name]   Sent: 02/15/08 05:35 PM   To: Heather Leigh Hofflich  Subject: 2-Procedural Question    Overall I feel very tired, but better than I did on Friday. My menses started ~4 days early and I had trouble sleeping last night b/c of cramps, so I think the fatigue is partially due to that. So far I haven't noticed too much benefit from taking the medication. Hope it kicks in soon and I will see you on October 22.    Best,  Brittany [name]

## 2008-02-21 NOTE — Telephone Encounter (Deleted)
 Actually I am open tomorrow at 10 am. I dont think Ms. Collver is coming. I just saw her this week.  So, you can just add Ms. Kemler in at 10 am .  See if she can do that? tks

## 2008-02-22 ENCOUNTER — Ambulatory Visit (INDEPENDENT_AMBULATORY_CARE_PROVIDER_SITE_OTHER): Admitting: "Endocrinology

## 2008-02-22 ENCOUNTER — Encounter (INDEPENDENT_AMBULATORY_CARE_PROVIDER_SITE_OTHER): Payer: Self-pay | Admitting: "Endocrinology

## 2008-02-22 ENCOUNTER — Telehealth (INDEPENDENT_AMBULATORY_CARE_PROVIDER_SITE_OTHER): Payer: Self-pay | Admitting: "Endocrinology

## 2008-02-22 NOTE — Telephone Encounter (Signed)
 From: [name]   To: [name]   Sent: Mon Feb 22, 2008 8:58 AM   Subject: 20-Other    Hello Dr. Pernell Brain,    I spoke with your nurse on Friday, and she said the earliest available appointment is Monday October 12. She was going to request that you open your schedule this week to see if I could get an appointment sooner. I would really appreciate that, as I still feel nauseous, tired, unable to do much without resting.    Sincerely,  Terance Felt    ----------------------  To: [name]  From: Tobi Fortes Hofflich  Received: 02/19/2008 3:03 PM    Please call and schedule an appointment to see me this week. I dont like that you are having dizziness continual and you need to be evaluated.
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
## 2008-02-22 NOTE — Telephone Encounter (Signed)
 Opened in error

## 2008-02-22 NOTE — Telephone Encounter (Signed)
 done

## 2008-02-22 NOTE — Telephone Encounter (Signed)
 Pt will come in today at 4:40 PM. I sent Lewie Rector a message to open you a slot.

## 2008-02-22 NOTE — Telephone Encounter (Signed)
 Dr. Pernell Brain pt requesting an appt to be seen this week, but there is no availability.Please advise.

## 2008-02-22 NOTE — Telephone Encounter (Signed)
 Brittany [name], this was the one I was talking about that you said was scheduled at 11:20.  Please add her on today at 4:40PM  tks

## 2008-02-22 NOTE — Patient Instructions (Signed)
 Increase methimazole to 30 mg once daily    Recheck On October 15th.

## 2008-02-24 ENCOUNTER — Encounter (INDEPENDENT_AMBULATORY_CARE_PROVIDER_SITE_OTHER): Payer: Self-pay | Admitting: "Endocrinology

## 2008-02-24 NOTE — Telephone Encounter (Signed)
Dr.Hofflich please advise.

## 2008-02-24 NOTE — Telephone Encounter (Signed)
 From: [name]   To: [name]   Sent: Wed Feb 24, 2008 9:35 AM   Subject: 20-Other    Hi Dr. Pernell Brain,    I've taken 30mg  of Methimazole the last 2 days (since I saw you on Monday), and it has made me very nauseous, even though I take it with food in the evening - I feel the effects more the next morning. Maybe I should go back to 20mg  until the 10/22 re-evaluation, or should I just tough it out?    Thanks,  Brittany [name]

## 2008-02-29 ENCOUNTER — Encounter (INDEPENDENT_AMBULATORY_CARE_PROVIDER_SITE_OTHER): Payer: Self-pay | Admitting: "Endocrinology

## 2008-02-29 NOTE — Telephone Encounter (Signed)
 Dr. Pernell Brain please advise. Pt would like an extension on her FMLA. If you agree please make letter with the dates listed above.

## 2008-02-29 NOTE — Telephone Encounter (Signed)
 From: [name]   To: [name]   Sent: Mon Feb 29, 2008 10:53 AM   Subject: 20-Other    RE: FMLA extension    Dear Dr. Pernell Brain,    I'm writing to request an extension on my FMLA paperwork. Part A:Medical Facts currently says 03/03/08 for Probably duration of condition. Please extend to at least 03/10/08 (the date of my re-evaluation with you) or 03/18/08, and fax a copy of revised page 1 to Ltanya Rummer at [phone].    Please let me know the extended date and when the paperwork has been sent.     Thank you!  Fancy Faries

## 2008-02-29 NOTE — Telephone Encounter (Signed)
 From: [name]   To: [name]   Sent: Mon Feb 29, 2008 3:40 PM   Subject: Willey Harrier!    A letter is fine. It just needs to state that my FMLA has been extended from March 03, 2008 to _date chosen by Dr. Elliot Gutting (10/22 or later). Please address to Ltanya Rummer, [place], [place] and fax to [phone].    Thank you!  Gareld June    From: Loa Riling  Received: 02/29/2008 11:13 AM  Hello there!  If Dr. Pernell Brain approves to extend your FMLA do you want her to just write a letter? We don't keep FMLA forms here in our clinic. Usually our patients provide the form. Please let me know how you would like to do this.  ROSE LVN

## 2008-03-01 ENCOUNTER — Encounter (INDEPENDENT_AMBULATORY_CARE_PROVIDER_SITE_OTHER): Admitting: Advanced Practice Midwife

## 2008-03-03 ENCOUNTER — Other Ambulatory Visit (INDEPENDENT_AMBULATORY_CARE_PROVIDER_SITE_OTHER): Payer: Self-pay | Admitting: "Endocrinology

## 2008-03-03 ENCOUNTER — Other Ambulatory Visit (INDEPENDENT_AMBULATORY_CARE_PROVIDER_SITE_OTHER)

## 2008-03-04 NOTE — Telephone Encounter (Signed)
Letter written. Please FAX to correct person listen in [place] message

## 2008-03-04 NOTE — Telephone Encounter (Signed)
 Called patient     Patient is not feeling any better, free T4 is now in normal limits. Will continue to 20 mg of methimazole

## 2008-03-07 NOTE — Telephone Encounter (Addendum)
 Letter faxed.

## 2008-03-10 ENCOUNTER — Ambulatory Visit (INDEPENDENT_AMBULATORY_CARE_PROVIDER_SITE_OTHER): Admitting: "Endocrinology

## 2008-03-10 MED ORDER — NORTHYX 20 MG OR TABS
20.0000 mg | ORAL_TABLET | Freq: Every day | ORAL | Status: DC
Start: 2008-03-10 — End: 2008-03-31

## 2008-03-10 NOTE — Progress Notes (Signed)
 Chief Complaint   Patient presents with   . Follow-up     GRAVES         Pt is a 42 year old female here for followup for    Patient is a 42 yo female with Grave's hyperthyroidism.  Has been on methimazole since August 24th, still with continued symptoms of hyperthyoridism.  Free t4 back in normal range  Patient states she felt dizzy yesterday and blacked out and became dizzy.  Patient felt that her heart was racing, feels lightheaded  Ptaeint on methimazole 20 mg once daily.  TFT's have greatly improved.   Patient feels dizzy, feels like blood racing out of head.      Past Medical History   Diagnosis Date   . Infectious Mononucleosis 08/2006         Past Surgical History   Procedure Date   . No past surgical history          Family History   Problem Relation   . Ovarian Cancer Neg Hx     but sister w/ovarian cysts age 64   . Breast Cancer Mother       History   Social History   . Marital Status: Divorced     Spouse Name: N/A     Number of Children: N/A   . Years of Education: N/A   Occupational History   . Not on file.   Social History Main Topics   . Tobacco Use: Never   . Alcohol Use: Yes      1 bottle wine /week   . Drug Use: No   . Sexually Active: Not Currently   Other Topics Concern   . Not on file   Social History Narrative   . No narrative on file          Current outpatient prescriptions   Medication Sig Dispense Refill   . methimazole (TAPAZOLE) 20 MG tablet Take 1 Tab by mouth daily.  30  3   . oseltamivir (TAMIFLU) 75 MG capsule Take 1 Cap by mouth 2 times daily.  10  0   . cetirizine (ZYRTEC) 10 MG tablet Take 1 Tab by mouth daily.  30  3   . acetaminophen (TYLENOL) 325 MG tablet Take 500 mg by mouth every 4 hours as needed for Mild Pain.       Aaron Aas aspirin 81 MG chewable tablet Take 1 Tab by mouth every 4 hours as needed for Mild Pain.  100  0   . bismuth (PEPTO-BISMOL) 262 MG chewable tablet Take 2 Tabs by mouth as needed for Heartburn.  45  0   . omeprazole (PRILOSEC) 20 MG capsule Take 1 Cap by mouth  daily.  30  0   . FLONASE 50 MCG/ACT NA SUSP 2 sprays q nostril daily  1  3       Review of patient's allergies indicates no known allergies.      Review of Systems -   Constitutional: + fatigue, no weight loss, fever, night sweats.  HEENT: none   CV: no chest pain, occ heart palpitations  Resp: negative for cough, shortness of breath, wheezing  GI: negative for vomiting, abdominal pain, or diarrhea  GU: No dysuria, urgency, frequency, or nocturia  Muculoskeletal:occ aches  Neuro: occ dizziness, no syncope,    Blood pressure 108/64, pulse 72, temperature 98.4 F (36.9 C), temperature source Oral, resp. rate 15, height 5\' 10"  (1.778 m), weight 164 lb (74.39 kg), last menstrual  period 02/14/2008.  Gen: AAOx3 NAD  HEENT: EOMI PERRLA. No proptosis  Neck: negative adenopathy. thyroid nonpalpable. nontender  Heart: RRR no murmurs, rubs, or gallops  Lungs: CTA B/L no wheezes, rales, or ronchi  Ext: no edema. peripheral pulses intact slight tremor B/L fingers  DTR's: 2/4 B/L KJ, triceps, Achilles      Results for orders placed in visit on 03/03/08   COMPREHENSIVE METABOLIC PANEL, BLOOD   Component Value Range   . GLUCOSE 87  70 - 115- (mg/dL)   . BUN 13  6-20 (mg/dL)   . CREATININE 0.76  0.51-0.95 (mg/dL)   . GFR (NON-AFRICAN AMER.) >60  - (mL/min)   . GFR (AFRICAN AMER.) >60  - (mL/min)   . SODIUM 140  136-145 (mmol/L)   . POTASSIUM 4.0  3.5-5.1 (mmol/L)   . CHLORIDE 104  98-107 (mmol/L)   . BICARBONATE 24  22-29 (mmol/L)   . CALCIUM 9.4  8.6-10.5 (mg/dL)   . BILIRUBIN, TOT 1.3 (*) <1.2- (mg/dL)   . TOTAL PROTEIN 6.5  6.0 - 8.0- (gm/dL)   . ALBUMIN 4.7  3.5-5.2 (gm/dL)   . AST (SGOT) 23  F: 0 - 32- (U/L)   . ALT (SGPT) 30  F: 0 - 33- (U/L)   . ALKALINE PHOS 52  F: 35-140- (U/L)   TSH, BLOOD   Component Value Range   . TSH 0.04 (*) 0.27-4.20 (ulU/mL)   FREE THYROXINE, BLOOD   Component Value Range   . FREE T4 1.05  0.93-1.70 (ng/dL)   TSH SENSITIVE, BLOOD   Component Value Range   . TSH SENSITIVE <0.03 (*) 0.35-5.50  (uIU/mL)         A/P 42 year old female with  Encounter Diagnoses   Code Name Primary? Qualifier   . 242.00 Grave's Disease---patient still with some clinical hyperthyroid signs and symptoms, however blood tests now much improved.  Continue methimazole 20 mg once daily  -will see patient iback in 2 weeks with TFT;s  -should expexct to see patient back in normal range at that time  -will monitor  -leave until Nov 11th as per pts request Yes     Plan: TSH, BLOOD, FREE THYROXINE, BLOOD, NORTHYX 20 MG OR TABS          All of the patients questions were answered and patient verbalized understanding and agreement of the above plan  The past medical history, surgical history, family history, and social history were reviewed and updated  I have reviewed recent medical encounters, lab data, imaging data, and procedures

## 2008-03-18 ENCOUNTER — Ambulatory Visit (INDEPENDENT_AMBULATORY_CARE_PROVIDER_SITE_OTHER): Payer: Self-pay | Admitting: Allergy

## 2008-03-23 ENCOUNTER — Ambulatory Visit (INDEPENDENT_AMBULATORY_CARE_PROVIDER_SITE_OTHER): Admitting: Advanced Practice Midwife

## 2008-03-23 ENCOUNTER — Encounter (INDEPENDENT_AMBULATORY_CARE_PROVIDER_SITE_OTHER): Payer: Self-pay | Admitting: Advanced Practice Midwife

## 2008-03-23 MED ORDER — CETIRIZINE HCL 10 MG OR TABS
10.00 mg | ORAL_TABLET | Freq: Every day | ORAL | Status: DC
Start: ? — End: 2022-02-02

## 2008-03-23 NOTE — Patient Instructions (Signed)
 Please make an appointment on your way out today with Dr. Alvester Johnson for a consult for heavy menstrual periods

## 2008-03-23 NOTE — Progress Notes (Signed)
 42 yo established patient of Dr. Josem Kaufmann for her annual GYN visit. Recent normal mammogram. Pt is currently being treated for a new diagnosis of Grave's disease. She has two complaints today: firstly, her periods are getting very heavy. She uses pads and typcially will soak through in an hour and a half. She is not anemic, having recently had a CBC. Of note, her sister just had a hysterectomy for uterine fibroids. In the past, she has tried OCPs but had so many side effects, she stopped taking them. The second complaint is that she feels like she gets a cold with every period. She does have allergies. I told her that since a cold is infectious, it seems highly unlikely she would be catching a cold regularly once a month. More likely it is an allergy flare. I told the patient maybe she should visit an allergist, and she said she actually has a referral for one, but with the recent problem of the Grave's disease, she has not gotten around to making the appointment.   EXAM: WNWD female in NAD  Thyroid: WNL  Lungs: clear  Heart: RRR without murmur  Breasts: no masses  Abdomen: no organomegaly  Pelvic: vulva without lesions; vagina well supported; cervix without lesions; uterus/ovaries WNL. Patient had some bleeding after the Pap  IMPRESSION: Normal GYN exam; heavy menses  PLAN: Pap done. Pelvic ultrasound ordered. Pt was advised to make an appointment with Dr. Josem Kaufmann. We also discussed options of Mirena IUD and endometrial ablation

## 2008-03-28 ENCOUNTER — Other Ambulatory Visit (INDEPENDENT_AMBULATORY_CARE_PROVIDER_SITE_OTHER)

## 2008-03-28 ENCOUNTER — Other Ambulatory Visit (INDEPENDENT_AMBULATORY_CARE_PROVIDER_SITE_OTHER): Payer: Self-pay | Admitting: "Endocrinology

## 2008-03-29 ENCOUNTER — Encounter (INDEPENDENT_AMBULATORY_CARE_PROVIDER_SITE_OTHER): Payer: Self-pay | Admitting: "Endocrinology

## 2008-03-29 NOTE — Telephone Encounter (Signed)
 From: [name]   To: [name]   Sent: Tue Mar 29, 2008 9:29 AM   Subject: 1-Non Urgent Medical Advice    Hello Dr. Pernell Brain,    My sister has Celiac disease, and suggested that the fatigue I continue to feel despite my thyroid now functioning in the normal range might be related to gluten intolerance. Apparently, a side effect of untreated gluten intolerance is a thyroid problem, either hyper or hypo.    Do you know of links between gluten intolerance and Graves disease? I think I've had the IgA blood test (sometime in the past 2 years) and it didn't indicate gluten intolerance, but would like to have the tTG tissue test and HLA gene test to see if I have the gene for celiac disease. What do you think?    I will see you this Thursday at our 9:20am appointment.    Best,  Brittany [name]

## 2008-03-31 ENCOUNTER — Ambulatory Visit (INDEPENDENT_AMBULATORY_CARE_PROVIDER_SITE_OTHER): Admitting: "Endocrinology

## 2008-03-31 MED ORDER — METHIMAZOLE 10 MG OR TABS
10.0000 mg | ORAL_TABLET | Freq: Every day | ORAL | Status: DC
Start: 2008-03-31 — End: 2008-04-07

## 2008-03-31 MED ORDER — METHIMAZOLE 10 MG OR TABS
10.0000 mg | ORAL_TABLET | Freq: Every day | ORAL | Status: DC
Start: 2008-03-31 — End: 2008-03-31

## 2008-03-31 NOTE — Patient Instructions (Signed)
 [email or web address]---email when you have thyroid testing--after January 1st.

## 2008-03-31 NOTE — Progress Notes (Signed)
 Chief Complaint   Patient presents with   . Follow-up     TSH         Pt is a 42 year old female here for followup for      1.  Grave's---patient feels good. Patient denies weight gain, fatigue, dry skin, cold intolerance,  constipation, hoarseness of the voice, depression, hair loss, any decreased concentration, or swelling around the eyes.  Pt recently reduced her methimazole dose because of concern for side effect of SOB.  Patient states a few weeks ago she felt like she was becomign SOB a lot and she reduced her dose to 10 mg daily. No episodes of the SOB in the last 2 weeks.     Past Medical History   Diagnosis Date   . Infectious Mononucleosis 08/2006         Past Surgical History   Procedure Date   . No past surgical history          Family History   Problem Relation   . Ovarian Cancer Neg Hx     but sister w/ovarian cysts age 77   . Breast Cancer Mother   . Other Sister     Uterine fibroids/hysterectomy       History   Social History   . Marital Status: Divorced     Spouse Name: N/A     Number of Children: N/A   . Years of Education: N/A   Occupational History   . Not on file.   Social History Main Topics   . Tobacco Use: Never   . Alcohol Use: Yes      1 bottle wine /week   . Drug Use: No   . Sexually Active: Not Currently   Other Topics Concern   . Not on file   Social History Narrative   . No narrative on file          Current outpatient prescriptions   Medication Sig Dispense Refill   . methimazole (TAPAZOLE) 10 MG tablet Take 1 Tab by mouth daily.  0  0   . cetirizine (ZYRTEC) 10 MG tablet Take 10 mg by mouth daily.       Aaron Aas DISCONTD: methimazole (TAPAZOLE) 20 MG tablet Take 1 Tab by mouth daily.  30  3       Review of patient's allergies indicates no known allergies.      Review of Systems -   Constitutional: negative for fatigue, weight loss, fever, night sweats.  HEENT: none   CV: no chest pain, heart palpitations  Resp: negative for cough, shortness of breath, wheezing  GI: negative for vomiting,  abdominal pain, or diarrhea  GU: No dysuria, urgency, frequency, or nocturia      Blood pressure 100/62, pulse 78, temperature 98 F (36.7 C), temperature source Oral, resp. rate 15, height 5\' 10"  (1.778 m), weight 164 lb (74.39 kg), last menstrual period 03/11/2008.  Gen: AAOx3 NAD  HEENT: EOMI PERRLA. No proptosis  Neck: negative adenopathy. thyroid nonpalpable.  Heart: RRR no murmurs, rubs, or gallops  Lungs: CTA B/L no wheezes, rales, or ronchi  Ext: no edema. peripheral pulses intact  DTR's: 2/4 B/L KJ, triceps, Achilles      Results for orders placed in visit on 03/28/08   TSH, BLOOD   Component Value Range   . TSH 1.15  0.27-4.20 (ulU/mL)   FREE THYROXINE, BLOOD   Component Value Range   . FREE T4 0.82 (*) 0.93-1.70 (ng/dL)  A/P 42 year old female with Grave's hyperthyroidism  -TSH now with in normal limits, and is most reliable test  -patient has reduced her dose to 10 mg of methimazole daily as she was concenred she was having SOB due to tapazole  -will follow pts symptoms closely and recheck TSH in 6 weeks (pt will check sooner if feels hyperthyroid sx again)  -adjust dose accordingly to TSH levels.  -will see pt back in Feb for followup and she will see another provider if needed sooner  All of the patients questions were answered and patient verbalized understanding and agreement of the above plan  The past medical history, surgical history, family history, and social history were reviewed and updated  I have reviewed recent medical encounters, lab data, imaging data, and procedures

## 2008-03-31 NOTE — Progress Notes (Signed)
 Addended by: FRAM, CHRISTINE on: 03/31/2008  2:32 PM     Modules accepted: Letters

## 2008-04-03 ENCOUNTER — Telehealth (HOSPITAL_BASED_OUTPATIENT_CLINIC_OR_DEPARTMENT_OTHER): Payer: Self-pay

## 2008-04-03 NOTE — Telephone Encounter (Signed)
 This is a referral request from Dr Pernell Brain for a celiac consultation. History of familial celiac disease and presently with " malabsorptive issues"  I forward to provider for his review.

## 2008-04-06 ENCOUNTER — Telehealth (INDEPENDENT_AMBULATORY_CARE_PROVIDER_SITE_OTHER): Payer: Self-pay | Admitting: Gastroenterology

## 2008-04-06 NOTE — Telephone Encounter (Signed)
 Patient called and state she will hold off on scheduling a clinic apt with Dr. Orson Slick for now,  pt will call back and schedule on a future date.

## 2008-04-06 NOTE — Telephone Encounter (Signed)
 Patient was unable to accept an appointment and said she not even sure she wants to see Dr Orson Slick .  She will decide and call back for an appointment if desired.

## 2008-04-06 NOTE — Telephone Encounter (Signed)
 Marquita Palms in scheduling called to offer patient an appointment with Dr Orson Slick at 3pm. I also called and left a message at her work number. Will hold the appointment one half hour to see if patient phones back, otherwise she can be booked into a frozen slot. Will need Celiac packet.

## 2008-04-06 NOTE — Telephone Encounter (Signed)
 Forwarded to Lupita Leash to assist with scheduling.  Okay to use any frozen slot.

## 2008-04-06 NOTE — Telephone Encounter (Signed)
OK

## 2008-04-06 NOTE — Telephone Encounter (Signed)
Will ask Brittany [name] to get patient in.

## 2008-04-07 ENCOUNTER — Telehealth (INDEPENDENT_AMBULATORY_CARE_PROVIDER_SITE_OTHER): Payer: Self-pay | Admitting: "Endocrinology

## 2008-04-07 MED ORDER — METHIMAZOLE 10 MG OR TABS
10.0000 mg | ORAL_TABLET | Freq: Two times a day (BID) | ORAL | Status: DC
Start: 2008-04-07 — End: 2008-05-25

## 2008-04-07 NOTE — Telephone Encounter (Signed)
 Dr. Sigurd Sos did you want me to call pt r/g new rx?

## 2008-04-07 NOTE — Telephone Encounter (Signed)
 No sorry. encounterw ill be closed

## 2008-04-18 ENCOUNTER — Ambulatory Visit (INDEPENDENT_AMBULATORY_CARE_PROVIDER_SITE_OTHER): Admitting: Obstetrics & Gynecology

## 2008-04-18 VITALS — BP 104/68 | Ht 70.0 in | Wt 171.0 lb

## 2008-04-21 ENCOUNTER — Telehealth (INDEPENDENT_AMBULATORY_CARE_PROVIDER_SITE_OTHER): Payer: Self-pay | Admitting: "Endocrinology

## 2008-04-21 NOTE — Telephone Encounter (Signed)
 RN spoke to Brittany [name]. Brittany [name] states she has Graves disease that has been getting worse over the past week - Brittany [name] states she has been feeling tired, has intermittent palpitations, dizziness, more frequent bowel movements for a few days. Denies f/c/n/v, shortness of breath and chest pain. No palpitation at this time. Brittany [name] would like appt to see Dr Lucretia Roers only (since Dr. Sigurd Sos is on leave). OV to see Dr. Lucretia Roers on 04/29/08 at 1100 given.  Brittany [name] confirmed appt with good understanding of valet parking fee, and will call back or go to ED if sx's persists or worsens.

## 2008-04-21 NOTE — Telephone Encounter (Signed)
 Verbally confirmed name of Primary Care Provider: yes    What is reason for call: Pt is having Graves disease symptoms that have been getting worse over the past week - pt feeling tired, palpitations, dizziness, more frequent bowel movements.  No chest pain, no fever.  Mild nausea.  Pt would like appt to see Dr Lucretia Roers.  Advised that pt may not get a call back today, due to the late hour.    Confirmed Contact Number:yes    This message will be transmitted to our triage nurse, you can expect a call by the end of the working day.

## 2008-04-22 ENCOUNTER — Other Ambulatory Visit (INDEPENDENT_AMBULATORY_CARE_PROVIDER_SITE_OTHER): Payer: Self-pay | Admitting: Internal Medicine

## 2008-04-22 ENCOUNTER — Other Ambulatory Visit (INDEPENDENT_AMBULATORY_CARE_PROVIDER_SITE_OTHER)

## 2008-04-22 ENCOUNTER — Other Ambulatory Visit (INDEPENDENT_AMBULATORY_CARE_PROVIDER_SITE_OTHER): Payer: Self-pay | Admitting: "Endocrinology

## 2008-04-22 NOTE — Telephone Encounter (Signed)
 Tried to call pt, but there was no answer. Left message on personal vm stating labs are already in the system and to have them done prior to office visit on 12.11.2009 with Dr. Lucretia Roers.

## 2008-04-22 NOTE — Telephone Encounter (Signed)
 Pt should have repeat thyroid labs done prior to appt, orders already in system.

## 2008-04-25 ENCOUNTER — Telehealth (INDEPENDENT_AMBULATORY_CARE_PROVIDER_SITE_OTHER): Payer: Self-pay | Admitting: "Endocrinology

## 2008-04-25 NOTE — Telephone Encounter (Signed)
 Spoke with aptient re: lab results.  Informed her that TSH was adequate on methimzole 20 mg.  I would maintain this dose.  Patient still with heart palpitations, and fatigue.  She feels the same symptoms as when seh was hyperthyroid.    She will see Dr. Lucretia Roers re: these symptoms. She feels she is still hyperthyoird.    Patient is euthyroid as per labs.  TSH is most sensitive indicator.  Patients free T4 is not as reliable. Will check total T4.

## 2008-04-29 ENCOUNTER — Ambulatory Visit (INDEPENDENT_AMBULATORY_CARE_PROVIDER_SITE_OTHER): Admitting: Internal Medicine

## 2008-04-29 NOTE — Progress Notes (Signed)
 Brittany [name] is a 42 year old female    C/o 2 wks of fatigue, nausea, headache. Feeling good today. Denies pregnancy, no sexual activity x 4 yrs. Has missed 3 days of work recently. Requesting letter for work stating that she can telecommute from home if not feeling well.    hx hyperthyroid dx'ed 8/09, started methimazole 9/09.    Past Medical History   Diagnosis Date   . Infectious Mononucleosis 08/2006       History   Social History   . Marital Status: Divorced     Spouse Name: N/A     Number of Children: N/A   . Years of Education: N/A   Occupational History   . Not on file.   Social History Main Topics   . Tobacco Use: Never   . Alcohol Use: Yes      1 bottle wine /week   . Drug Use: No   . Sexually Active: Not Currently   Other Topics Concern   . Not on file   Social History Narrative   . No narrative on file          Current outpatient prescriptions   Medication Sig Dispense Refill   . methimazole (TAPAZOLE) 10 MG tablet Take 1 Tab by mouth 2 times daily.  60  6   . cetirizine (ZYRTEC) 10 MG tablet Take 10 mg by mouth daily.           Allergies:Review of patient's allergies indicates no known allergies.    ROS: per HPI    Vitals-Blood pressure 106/72, pulse 76, temperature 97.9 F (36.6 C), temperature source Oral, weight 168 lb 9.6 oz (76.476 kg).  Gen-no apparent distress, alert, oriented x 3  Skin-warm to touch  Neck- no carotid bruits, no masses  Cor-RRR, no murmur  Pulm-clear to auscultation bilat, no rales/rhonchi/wheezes  Abd-soft, bowel sounds present, non-tender,no masses  Ext-no edema, no clubbing/cyanosis    Results for orders placed in visit on 04/22/08   TSH, BLOOD   Component Value Range   . TSH 1.62  0.27-4.20 (ulU/mL)   FREE THYROXINE, BLOOD   Component Value Range   . FREE T4 0.88 (*) 0.93-1.70 (ng/dL)   HEMOGRAM, BLOOD   Component Value Range   . WBC 5.7  4.0-11.0 (1000/mm3)   . RBC 4.26  4.00-5.00 (mill/mm3)   . HGB 13.5  12.0-16.0 (gm/dL)   . HCT 39.7  36.0-46.0 (%)   . MCV 93.2   82.0-98.0 (um3)   . MCH 31.7 (*) 27-31 (pgm)   . MCHC 34.0  32-37 (%)   . RDW 14.4  10-15 (%)   . PLT COUNT 221  130-400 (1000/mm3)   . MPV 10.8 (*) 7.4-10.4 (fl)     Encounter Diagnoses   Code Name Primary? Qualifier   . 242.00 Grave's Disease: thyroid labs stable, pt to continue methimazole 20mg  daily but may try splitting dose to bid to see if symptoms improve, letter written for pt to be able to telecommute for work if ill, reassurance Yes      I spent greater than 50% of visit time in counseling patient.  Total visit time:  25 minutes.

## 2008-05-24 ENCOUNTER — Ambulatory Visit (INDEPENDENT_AMBULATORY_CARE_PROVIDER_SITE_OTHER): Admitting: Internal Medicine

## 2008-05-24 NOTE — Progress Notes (Signed)
 The patient returns for followup of her Graves' disease, GERD, and vitamin D deficiency.  She states she is having a great deal of difficulty with the methimazole with intermittent palpitations, sleeplessness, and loose stools. She states she is having difficulties after exercising such as hiking. She feels that the methimazole as not adequately controlling her hypothyroidism even though the tests are normal and is considering radioactive ablation. She denies any reflux symptoms and continues to take her vitamin D as prescribed.    Past Medical History   Diagnosis Date   . Infectious Mononucleosis 08/2006            Current outpatient prescriptions   Medication Sig   . methimazole (TAPAZOLE) 10 MG tablet Take 1 Tab by mouth 2 times daily.   . cetirizine (ZYRTEC) 10 MG tablet Take 10 mg by mouth daily.         No Known Allergies    ROS--    No symptoms of hypo or hyperthyroidism: no decreased or increased weight, no feeling cold/chilly or excessively warm, no diarrhea or constipation, no undue sweatiness, anxiety or palpitations.    The patient denies cough, chest pain, dyspnea, wheezing or hemoptysis.    Patient denies any exertional chest pain, dyspnea, palpitations, syncope, orthopnea, edema or paroxysmal nocturnal dyspnea.    The patient denies abdominal or flank pain, anorexia, nausea or vomiting, dysphagia, change in bowel habits or black or bloody stools or weight loss.    The patient denies swelling, numbness, tingling or weakness in the extremities.    PE--    She appears well, in no apparent distress.  Alert and oriented times three, pleasant and cooperative. Vital signs are as noted by the nurse.    Filed Vitals:    05/24/2008 12:39 PM   BP: 110/60   Pulse: 66   Temp: 98.4 F (36.9 C)   TempSrc: Oral   Resp: 16   Weight: 168 lb (76.204 kg)         Thyroid palpable, somewhat enlarged, no nodules detected.    Chest is clear, no wheezing or rales. Normal symmetric air entry throughout both lung fields. No  chest wall deformities or tenderness.    S1 and S2 normal, no murmurs, clicks, gallops or rubs. Regular rate and rhythm. Chest is clear; no wheezes or rales. No edema or JVD.    The abdomen is soft without tenderness, guarding, mass, rebound or organomegaly. Bowel sounds are normal. No CVA tenderness or inguinal adenopathy noted.    Extremities: extremities, peripheral pulses and reflexes normal, no edema, redness or tenderness in the calves or thighs, feet normal, good pulses, normal color, temperature and sensation.    Results for orders placed in visit on 04/22/08   TSH, BLOOD   Component Value Range   . TSH 1.62  0.27-4.20 (ulU/mL)   FREE THYROXINE, BLOOD   Component Value Range   . FREE T4 0.88 (*) 0.93-1.70 (ng/dL)   HEMOGRAM, BLOOD   Component Value Range   . WBC 5.7  4.0-11.0 (1000/mm3)   . RBC 4.26  4.00-5.00 (mill/mm3)   . HGB 13.5  12.0-16.0 (gm/dL)   . HCT 39.7  36.0-46.0 (%)   . MCV 93.2  82.0-98.0 (um3)   . MCH 31.7 (*) 27-31 (pgm)   . MCHC 34.0  32-37 (%)   . RDW 14.4  10-15 (%)   . PLT COUNT 221  130-400 (1000/mm3)   . MPV 10.8 (*) 7.4-10.4 (fl)  A/P--    Encounter Diagnoses   Code Name Primary? Qualifier   . 242.00 Grave's Disease--although the patient has chemical evidence of control of her hyperthyroidism, she still has symptoms compatible with an overactive gland. She apparently is not doing well on the methimazole. She will be seeing Dr. Sigurd Sos next month who will evaluate her and consider possible radioactive ablation.      . 530.81 GERD (Gastroesophageal Reflux Disease)--patient is without symptoms. Will continue to follow.      . 268.9 Vitamin D Deficiency--continue vitamin D supplementation.

## 2008-05-24 NOTE — Progress Notes (Signed)
 AVS information given and explained. Patient verbalized good understand.

## 2008-05-25 ENCOUNTER — Telehealth (INDEPENDENT_AMBULATORY_CARE_PROVIDER_SITE_OTHER): Payer: Self-pay | Admitting: Internal Medicine

## 2008-05-25 ENCOUNTER — Telehealth (INDEPENDENT_AMBULATORY_CARE_PROVIDER_SITE_OTHER): Payer: Self-pay | Admitting: "Endocrinology

## 2008-05-25 MED ORDER — METHIMAZOLE 10 MG OR TABS
10.0000 mg | ORAL_TABLET | Freq: Every day | ORAL | Status: DC
Start: 2008-05-25 — End: 2008-08-25

## 2008-05-25 NOTE — Telephone Encounter (Signed)
 Spoke to pt and notified her r/g Dr. Lucretia Roers recommendations. Pt verbalizes and understands and will take 10 mg daily and will check thyroid labs before office visit with Dr. Sigurd Sos.

## 2008-05-25 NOTE — Progress Notes (Signed)
 Addended by: Josem Kaufmann on: 05/25/2008  2:40 PM     Modules accepted: Letters

## 2008-05-25 NOTE — Telephone Encounter (Signed)
 Spoke with patient re: RAI . Explained risks, benefits, synthorid treatment if becomes hypothyroid. Pt understands and wishes to proceed.  Pt will continue methimazole 10 mg once daily until instructed by nuclear medicine to stop.    Rose, please call pt and give her number to shcedule thryoid ablation. tks

## 2008-05-25 NOTE — Telephone Encounter (Signed)
 Please call pt: can decrease methimazole to 10mg  once a day. Pt to repeat thyroid labs prior to appt w/ Dr. Sigurd Sos in Feb.

## 2008-05-25 NOTE — Telephone Encounter (Signed)
 Called and left message to return myc all

## 2008-05-25 NOTE — Progress Notes (Signed)
 Addended by: [name] on: 05/25/2008 10:17 AM     Modules accepted: Letters

## 2008-05-25 NOTE — Telephone Encounter (Signed)
 Spoke to pt and notified her with number and scheduling info for RAI.

## 2008-05-26 ENCOUNTER — Other Ambulatory Visit (INDEPENDENT_AMBULATORY_CARE_PROVIDER_SITE_OTHER): Payer: Self-pay | Admitting: "Endocrinology

## 2008-05-26 NOTE — Progress Notes (Signed)
 43 yo female here for follow up s/o ultrasound revealing small 1.7cm uterine fibroid. Pt denies any pelvic pain.  Does have some heavier menses, but most are normal flow.  No other complaints.  Had normal pap and gyn exam 11/09.    sono 11/09  The uterus has a 3 mm endometrial stripe. A single solid mass measuring 1.7 x  1.3 x 1.0 cm is seen along the posterior wall of the uterus (seen anteriorly   on the transvaginal scan since the uterus flipped to a retroverted position   during TV scanning). It does not abut the endometrium and is positioned within  the myometrium.     The right ovary is normal and measures 3.4 x 1.6 x 2.8 cm. The left ovary is   2.6 x 1.8 x 1.4 cm and is normal.     No free fluid is seen within pelvis.    IMPRESSION:  Uterine fibroid along posterior wall measuring 1.7 cm in maximum diameter.    A/p  Small intramural fibroid.    Discussed treatment options if needed including hormonal management, endometrial ablation, [place], hyst.  Pt without significant symptoms currently and declines any intervention.      Will f/u prn heavier menses or any other concerns.

## 2008-06-03 MED ORDER — ATENOLOL 25 MG OR TABS
25.0000 mg | ORAL_TABLET | Freq: Every day | ORAL | Status: DC
Start: 2008-06-03 — End: 2008-08-25

## 2008-06-03 NOTE — Telephone Encounter (Signed)
 Spoke with patient and she will go on atenolol 12.5 mg if needed to control thyroid symptoms while off m;ethimazole and post RAI.  SE explained in great detail to patient including but not limited to hypotension.  Pt will increase/dec dosage if needed. Will follow closely with me and Dr. Pattricia Boss    All of the patients questions were answered and patient verbalized understanding and agreement of the above plan

## 2008-06-10 ENCOUNTER — Telehealth (INDEPENDENT_AMBULATORY_CARE_PROVIDER_SITE_OTHER): Payer: Self-pay | Admitting: "Endocrinology

## 2008-06-16 ENCOUNTER — Other Ambulatory Visit: Payer: Self-pay

## 2008-06-21 NOTE — Telephone Encounter (Signed)
 Entered in error

## 2008-06-24 ENCOUNTER — Telehealth (INDEPENDENT_AMBULATORY_CARE_PROVIDER_SITE_OTHER): Payer: Self-pay | Admitting: Internal Medicine

## 2008-06-24 ENCOUNTER — Emergency Department: Payer: Self-pay

## 2008-06-24 NOTE — Telephone Encounter (Signed)
 Verbally confirmed name of Primary Care Provider: yes    What is reason for call: Pt states she has a bruised and painful neck X 2d.    Confirmed Contact Number:yes    This message will be transmitted to our triage nurse, you can expect a call by the end of the working day.

## 2008-06-24 NOTE — ED Notes (Addendum)
==============================   ATTENDING NOTE =============================    02/05 1042 Larinda Buttery, MD Attending     pt seen by me. case reviewed with dr. Wallene Dales. 43 yo wman c   2 d lower neck pain p putting on sports bra. states pain   over b lower posterior neck, worse c rom. no radation. no   ext n/t/w, no other complaints. pt taking tylenol with   minimal relief. pmd farber.   exam   vs as noted, wnwd, heent ncat, neck no st, +lower   paraspinous ttp, +rom c pain, back nt, neuro nf, motor/sens   intact throughout, gait normal, exam o/w as above.   imp   neck pain, musculoskeletal on exam, likely strain, will give   meds, pt to fu pmd.

## 2008-06-24 NOTE — ED Notes (Addendum)
 ============================== ADMIT SUMMARY ==============================    RECEIVING NURSE -   ED NURSE -     +------------------------------- ALLERGIES -------------------------------+   No known drug allergies (02/12/2008);     +-------------------------- ADMITTING DIAGNOSIS --------------------------+       +--------------------------- ADMITTING SERVICE ---------------------------+  ADMISSION SERVICE -   LEVEL OF CARE -   ATTENDING -   RESIDENT -     +------------------------ MOST RECENT VITAL SIGNS ------------------------+  BP - 114/72 PULSE - 82   RESPIRATIONS - 16 O2 SAT - 100   TEMPERATURE - 97.4 MODE - Oral   GCS TOTAL - 15   PAIN - 6 PAIN QUALITY - Constant   PAIN LOCATION - neck   DATE/TIME - 06/24/2008 0959    +-------------------------------- FLUIDS ---------------------------------+  DATE TIME IV FLUID L/R LOCATION SIZE HUNG ABSORBED  ---------------------------------------------------------------------------     TOTAL IV: 0 ml    TOTAL OUTPUT: 0 ml TOTAL PO: 0 ml    +------------------------------ MEDICATIONS ------------------------------+  DATE TIME MEDICATION VERIFYING RN RN INIT  ---------------------------------------------------------------------------    02/05 1044 Ibuprofen 600 MG PO-(TAB) MAL    +------------------------------- LABS DONE -------------------------------+  ACT- MD MD RN AP +INITIALS+  IVE DATE TIME TIME TIME TREATMENT ORDERS MD RN AP   ---------------------------------------------------------------------------      EKG DONE - NO    +---------------------------- PROCEDURE NOTES ----------------------------+      +------------------------ CURRENT MEDICATION --------------------------+     Atenolol for   +------------------------ OTHER NURSING PROCEDURES -----------------------+     +--------------------------- PSYCHOSOCIAL NEEDS --------------------------+  +------------------------- BARRIERS TO LEARNING --------------------------+    ASSESSMENT- Assessment Done with  Findings of:      BARRIERS-    No Barriers  SUPPORT PERSON-      SPECIAL CONSIDERATIONS-      ============================== TRIAGE RECORD ==============================    CHIEF COMPLAINT- Neck Pain    TIME OF ONSET- 06/22/08 : +-STANDING-+ +--SEATED--+  TRIAGE CATEGORY- 2 : BP PULSE BP PULSE   ROOM- T6 : N/A/N/A N/A 114/72 82   MODE OF ARRIVAL- Car :   IN CUSTODY- No : TEMP MODE O2SAT RESP LMP   PRIVATE MD- N/A : 97.4 Oral 100 16 N/A       :   WORK RELATED INJURY- No : +--GCS--+ +--PUPILS--+  RETURN IN 72 HOURS- None : E V M TOT L R RESPONSE  TRIAGE NURSE- [place] : 4 5 6 15  X X N/A     IS THIS VISIT RELATED TO ASSAULT OR DOMESTIC VIOLENCE- no   PAIN TYPE- V NOW- 6 TOLERABLE AT- 6 QUALITY- Constant      PAIN LOCATION- neck RADIATES TO- n/a   LATEX ALLERGY FORM- N/A LATEX ALLERGY- N/A TETANUS- N/A   IMMUNIZATION- N/A PED HEIGHT- N/A WEIGHT- N/A KG  ADDITIONAL FORMS- N/A  +------------------------- CARE PRIOR TO ARRIVAL -------------------------+   tylenol and ice application  +------------------------------- ALLERGIES -------------------------------+    MEDICATION ALLERGY REACTION  ---------------------------------------------------------------------------    N/A No known drug allergies Unknown Rea    +------------------------------ MEDICATIONS ------------------------------+    MEDICATION NAME DOSAGE FREQUENCY  ---------------------------------------------------------------------------    Atenolol N/A N/A     +-------------------------- PAST MEDICAL HISTORY -------------------------+     +---------------------------- CURRENT HISTORY ----------------------------+   Pt c/o neck pain with swelling since Wednesday s/p putting on a sports bra   - pulling bra over head when pt heard a "snap" and noticed swelling. Pt   denies numbness/tingling of extremities, MAE with purpose     =========================== REASSESSMENT VITALS ===========================  R T M ET    E E O CO2 PU-    S M D O2 ET TY- PIL  +---GCS----+   DATE TIME BP HR P P E SAT CO2 PE L R E V M TOT POSITION      ---------------------------------------------------------------------------    02/05 0959 114/72 82 16 97.4 O 100 4 5 6 15  Seated   02/05 0959 / Standing     +-----------------PAIN-----------------+   T I    Y N T N    P O O I   DATE TIME E W L LOCATION QUALITY RADIATES COMMENT T   ---------------------------------------------------------------------------    02/05 0959 V 6 6 neck Constant n/a Triage AKS  02/05 0959 Triage AKS    ============================= NURSE DISCHARGE =============================    DISCHARGE NURSE- [facility]   DISPOSITION- Discharged from ED WITH- By Self   ACCOMPANIED BY- N/A   Jeryl [place] Brantley Fling-    N/A   TIME OF DISPOSITION- 06/24/2008 1125 LEFT ED VIA- [facility]ulate   TRANSFERRED TO- N/A REASON- N/A   ADMITTED TO- N/A ROOM- N/A   NURSE REPORT TO- N/A REPORT TIME- X N/A   BELONGINGS- With Patient ENVELOPE NUMBER- N/A   CONDITION ON DISCHARGE- Stable   AFTERCARE PROVIDED WITH- Written and Verbal   WHAT AFTERCARE INSTRUCTIONS WERE GIVEN AND REVIEWED WITH PATIENT  AND/OR FAMILY?- (see EPIC instructions)   Cervical Strain - Without Xrays (neck strain, whiplash);   IN WHAT LANGUAGE WERE THESE GIVEN?- English OTHER: N/A   TRANSLATED BY- N/A OTHER: N/A   GCS- E: N/A V: N/A M: N/A TOTAL: N/A  PAIN LEVEL UPON DISPOSITION- 6 OUT OF 10  WHAT MEDS WERE PROVIDED FROM DISCHARGE PYXIS?-    None   RX TO BE FILLED FOR- Vicodin Please review with MD.; Motrin Please review   with MD.; Valium Please review with MD.;   DID THE PATIENT OR RESPONSIBLE CARE PROVIDER UNDERSTAND THE FOLLOW UP  RECOMMENDATION?- Yes DISPOSITION BY- RN   NURSING LEVEL- 1. Simple ED Visit: Minimal Interaction     +------------------------------- RESTRAINTS ------------------------------+  ALTERNATIVE ATTEMPTS:    -------------------------- RESTRAINT ASSESSMENTS --------------------------  ============================== POINT OF CARE  ==============================    OCCULT BLOOD STOOL RESULTS   Norm results neg.  DATE TIME RESULTS DONE BY CONTROL POSTIVE CONTROL NEGATIVE     URINE PREGNANCY TEST   Norm results for non pregnant females neg.  DATE TIME RESULTS DONE BY CONTROL POSTIVE     URINE DIP   Norm results - All neg. with pH 5.0 to 8.0 and urobili 0.2 to 1.0   LEUKO NI- PRO- GLU- URO-   DATE TIME CYTE TRITE PH TEIN COSE KETONES BILI BILI BLOOD BY     FINGER STICK GLUCOSE   Norm results 65 to 110 mg/dl  DATE TIME RESULTS DONE BY     FINGER STICK HEMOGLOBIN   Norm results adult female 22 to 17 gm/dl  Norm results adult female 51 to 16 gm/dl  DATE TIME RESULTS DONE BY     ============================== MD NOTES H&P ===============================    TIME OF NOTE WRITTEN- N/A   CHIEF COMPLAINT- Neck Pain     HISTORY OF PRESENT ILLNESS  02/05 1034 Alvira Philips, MD     43 y/o f p/w 2 days of tender midline posterior neck pain   and stiffness that came on acutely as she was putting her   arms through a sports bra. Pt without any hx of neck   trauma/surgery, no  weakness/numbness/vision   changes/dizziness. No other complaints.      PAST MEDICAL/SURGICAL HISTORY   N/A     FAMILY HISTORY- none   SOCIAL HISTORY-   SMOKING ALCOHOL ILLICIT DRUGS HOMELESS MARRIED EMPLOYED   NONE NONE NONE NO M [place]   - [facility]  OTHER- none   REVIEW OF SYSTEMS- All other systems reviewed and are negative.     PHYSICAL EXAM  02/05 1041 Alvira Philips, MD     vs noted wnl, afebrile   gen alert, well appearing female, nad, able to turn head   with minimal discomfort.   heent mild midline ttp at T1, no ecchymosis, decreased ROM   with forward flexion 2/2 tenderness, negative Sperlings   test,    neuro: normal sensation throughout b/l upper ext. prox and   distal flex/ext/add/abd 5/5 strength.   ext: no swelling, 2+ BR pulses b/l.   cor rrr   ctab   abd s/nt/nd      IMPRESSION  02/05 1034 Alvira Philips, MD     43 y/o f with neck pain and normal motor/neuro exam and   negative  Sperling's test for radiculopathy.      MEDICAL DECISION MAKING

## 2008-06-24 NOTE — ED Notes (Addendum)
 02/05 1034 Alvira Philips, MD     1. neck strain   2. flexeril, motrin, ice and vicodin at night for sleep.   3. PMD follow up and physical therapy referral.    CASE PRESENTED TO- N/A     ============================= PHYSICIAN NOTES =============================      ============================= PROCEDURE NOTES =============================      ================================ LAB NOTES ================================      ================================= IMAGES ==================================      ============================== MD DISCHARGE ===============================    DISCHARGE PHYSICIAN- Alvira Philips   CHIEF COMPLAINT- Neck Pain CASE PRESENTED TO- Larinda Buttery   CONDITION OF DISCHARGE- Stable   WAS THIS VISIT FOR A WORK RELATED ILLNESS OR INJURY- No   PRIMARY CARE PHYSICIAN- FARBER NEIL MD   HAS PCP BEEN CONTACTED- N/A H&P NOTE WAS DICTATED- No     +-------------------------- DISCHARGE DIAGNOSIS --------------------------+   723.1 CERVICALGIA     +------------------------- DISCHARGE INSTRUCTIONS ------------------------+    02/05 1044 Alvira Philips, MD     PHYSICIAN- Larinda Buttery, MD Attending FOLLOW-UP(DAYS)- N/A    APPOINTMENT- N/A RETURN TO- N/A LANGUAGE- English    INSTRUCTIONS-   MEDICATIONS-   REFERRAL CLINICS-   REFERRAL PHYSICIANS-   ADDITIONAL INSTRUCTIONS-   N/A     02/05 1111 Alvira Philips, MD     PHYSICIAN- Larinda Buttery, MD Attending FOLLOW-UP(DAYS)- N/A    APPOINTMENT- N/A RETURN TO- N/A LANGUAGE- English    INSTRUCTIONS-   MEDICATIONS-   REFERRAL CLINICS-   REFERRAL PHYSICIANS-   ADDITIONAL INSTRUCTIONS-   N/A       +----------------------- MEDICATION RECONCILIATION -----------------------+    ---------------------------- CURRENT MEDICATION ---------------------------  MEDICATION NAME DOSAGE FREQUENCY  ---------------------------------------------------------------------------    Atenolol N/A N/A     ---------------------------- STOPPED MEDICATION ---------------------------  MEDICATION NAME  DOSAGE FREQUENCY  ---------------------------------------------------------------------------      ---------------------------- UPDATED MEDICATION ---------------------------  MEDICATION NAME DOSAGE FREQUENCY  ---------------------------------------------------------------------------      ----------------------------- ADDED MEDICATION ----------------------------  MEDICATION NAME DOSAGE FREQUENCY  ---------------------------------------------------------------------------    Vicodin Please review with MD. 1 MG Every 6 Hours   Motrin Please review with MD. 1 MG Every 8 Hours   Valium Please review with MD. 1 MG Every 8 Hours       ============================= FOLLOW UP NOTES =============================

## 2008-06-24 NOTE — ED Notes (Addendum)
=================================   ORDERS ==================================    ACT- MD MD RN AP   IVE DATE TIME TIME TIME TREATMENT ORDERS MD RN   ---------------------------------------------------------------------------     02/05 1030 1037 1031 Ibuprofen 600 MG PO-(TAB) Please MRV RCS    review with MD.  NO 02/05 1030 1031 Cyclobenzaprine HCl 5 MG PO-(TAB) MRV   NO 02/05 1037 1037 1038 CANCELLED -- Cyclobenzaprine HCl 5 MRV RCS    MG PO-(TAB)   02/05 1101 1125 1102 Discontinue IV Line As Indicated MRV MAL    02/05 1101 1125 1102 Discharge MRV MAL

## 2008-06-24 NOTE — Telephone Encounter (Signed)
 Noted, thanks!

## 2008-06-24 NOTE — Telephone Encounter (Signed)
 RN spoke to Pt. Patient states that she "hurt her lt side of neck on Wed, "something snapped while she was getting dressed", neck is swollen and bruised with pain, PS 5/10 (dull, constant). Denies f/c/n/v, shortness of breath. Pt states when it first happened, taking a deep breath hurts but she is feeling ok now. Pt states she has no full mobility of neck. Denies numbness or tingling on neck and arms. PT ADVISED TO GO TO THE ER FOR EVAL AND TX.  PT VERBALIZED FULL UNDERSTANDING WHY SHE NEEDS IMMEDIATE TX.  WILL GO NOW TO THE [facility].    TO: Send to ER, Dr. Renea Ee, RN  Telephone order read back and verified by MD.  Dr. Pattricia Boss, order for your review and approval.

## 2008-06-27 ENCOUNTER — Other Ambulatory Visit (INDEPENDENT_AMBULATORY_CARE_PROVIDER_SITE_OTHER): Payer: Self-pay | Admitting: Internal Medicine

## 2008-06-27 ENCOUNTER — Ambulatory Visit (INDEPENDENT_AMBULATORY_CARE_PROVIDER_SITE_OTHER): Admitting: Interventional Cardiology

## 2008-06-27 LAB — BASIC METABOLIC PANEL, BLOOD
BUN: 13 mg/dL (ref 6–20)
Bicarbonate: 28 mmol/L (ref 22–29)
Calcium: 10.6 mg/dL — ABNORMAL HIGH (ref 8.6–10.5)
Chloride: 100 mmol/L (ref 98–107)
GFR: >60 mL/min
Glucose: 85 mg/dL (ref ?–115)
Potassium: 4 mmol/L (ref 3.5–5.1)
Sodium: 139 mmol/L (ref 136–145)

## 2008-06-27 MED ORDER — IBUPROFEN 600 MG OR TABS
600.00 mg | ORAL_TABLET | Freq: Four times a day (QID) | ORAL | Status: DC | PRN
Start: ? — End: 2008-08-18

## 2008-06-27 NOTE — Patient Instructions (Signed)
 1. Thyroid and basic labs today  2. Follow with Dr. Sigurd Sos on 07/07/08 to address starting Synthroid  3. Physical therapy referral with Sharlene Dory

## 2008-06-27 NOTE — Progress Notes (Addendum)
Brittany [name] is a 43 year old female with a past medical history significant for Grave's disease who is here for evaluation of neck pain. Pt encountered a stiff neck with limited neck ROM starting 5 days ago. [facility] with diagnosis of neck sprain treated with ibuprofen, flexeril, warm packs. She has taken the ibuprofen and warm packs with most relief with ibuprofen. Pain mostly over the left trapezius area has decreased since onset, not associated with radiating arm pain or weakness, and neck ROM has improved. She also notes recent treatment of Grave's dz with radioactive iodine tx and has felt heat intolerance and low energy for the past week, requesting labs for thyroid f/u. Loose stools and palpitations have decreased over the past month. Pt prefers to have labs checked and consider starting synthroid when seen by Dr. Sigurd Sos on 07/07/08.     Past Medical History   Diagnosis Date    Infectious Mononucleosis 08/2006       Past Surgical History   Procedure Date    No past surgical history        No Known Allergies  Current outpatient prescriptions:ibuprofen (IBU) 600 MG tablet, Take 600 mg by mouth every 6 hours as needed., Disp: , Rfl: ;  atenolol (TENORMIN) 25 MG tablet, Take 1 Tab by mouth daily., Disp: 30, Rfl: 3;  methimazole (TAPAZOLE) 10 MG tablet, Take 1 Tab by mouth daily., Disp: 0, Rfl: 0;  cetirizine (ZYRTEC) 10 MG tablet, Take 10 mg by mouth daily., Disp: , Rfl:   History   Social History    Marital Status: Married     Spouse Name: N/A     Number of Children: N/A    Years of Education: N/A   Social History Main Topics    Tobacco Use: Never    Alcohol Use: Yes      1 bottle wine /week    Drug Use: No    Sexually Active: Not Currently   Other Topics Concern    Not on file   Social History Narrative    No narrative on file       Family History   Problem Relation    Ovarian Cancer Neg Hx     but sister w/ovarian cysts age 43    Breast Cancer Mother    Other  Sister     Uterine fibroids/hysterectomy       Review of Systems - See HPI for pertinent details. Otherwise negative ROS.    Physical Exam:  BP 102/70   Pulse 74   Temp(Src) 98.2 F (36.8 C) (Oral)   Resp 18   Wt 171 lb (77.565 kg)  Physical Exam:  Gen: AOx3 NAD  HEENT: Atraumatic normocephalic, PERRL EOMI; CN2-12 intact; no thyroid mass or tenderness; no LAD, mucus membranes moist and pink; no nasal/oral lesions, discharge, or discoloration. TM clear and grey. No significant thyroid enlargement. Neck ROM: 45 degrees forward flexion, full posterior extension, 80 degrees rotation bilaterally, 20 degrees abduction bilaterally.  CV: RRR, nl s1s2, no m/r/g   Resp: CTA B, no splinting, Excursions are symmetric Bilaterally  Back: no deformity, no pt tenderness, no cva tenderness, no limitation of movement, no pain with flexion, extension, or rotation.  Abd: soft, non-tender, non-distended, no masses B.S. normal x4 quadrants  Genital: deferred  Rectal: deferred  Neuro: no FND  Ext: strength 5/5 x4 ext. No peripheral edema. No deformity    Labs: reviewed. Notable for low TSH  from early January    Assessment / Plan: 52F with h/o Graves disease who presents with improved neck strain that may benefit from physical therapy referral, as well as subclinical hypothyroidism on methimazole prior to undergoing radioactive iodine therapy last month.     1. Neck strain - Improved symptoms but not fully resolved. No radicular symptoms or complications. Neck ROM still limited. Will place referral to [facility] for physical therapy. Pt can continue taking ibuprofen and applying warm packs as needed.    2. Graves disease - Will likely be fully hypothyroid in the near future. Pt has f/u appt with Dr. Sigurd Sos in 10 days. Will draw thyroid labs and BMP prior to visit with Dr. Sigurd Sos. Pt can be evaluated for start of Synthroid at that visit.    Case discussed with attending Dr. Pattricia Boss  Note Author: Arne Cleveland, MD

## 2008-06-27 NOTE — Progress Notes (Signed)
 This patient presents today with neck pain and for f/u of Grave's disease recently treated with radioactive ablation.  I have personally seen and examined the patient, reviewed the chart, and discussed with the resident.  I agree with the resident's findings, diagnoses and plans.

## 2008-06-29 ENCOUNTER — Other Ambulatory Visit (INDEPENDENT_AMBULATORY_CARE_PROVIDER_SITE_OTHER): Payer: Self-pay | Admitting: "Endocrinology

## 2008-06-29 MED ORDER — LEVOTHYROXINE SODIUM 112 MCG OR TABS
112.0000 ug | ORAL_TABLET | Freq: Every day | ORAL | Status: DC
Start: 2008-06-29 — End: 2008-08-25

## 2008-06-29 NOTE — Telephone Encounter (Signed)
 Reviewed eresults of TSH, pt with sx of hypothoyridism.  exlained all sideeffects. Will start on 112 mcg  Please make sure that you are on the same generic brand at all times as there is a 20% variability between different brands of thyroid medication    Take the medicine 30 min -1 hour before eating and not within two hours of any calcium or iron supplements

## 2008-07-07 ENCOUNTER — Ambulatory Visit (INDEPENDENT_AMBULATORY_CARE_PROVIDER_SITE_OTHER): Admitting: "Endocrinology

## 2008-07-07 NOTE — Progress Notes (Signed)
 Chief Complaint   Patient presents with   . Thyroid Problem     f/u         Pt is a 43 year old female here for followup for    1. Grave's disease s/p I131 1/29 with 16.7 micuries.  Patient then had TSH level at 5.75 on 06/27/08 and was started on synthroid 112 mcg once daily.  Patient feels tired, but feels more energy than a few weeks ago.  Patient denies weight gain,  dry skin, cold intolerance,  constipation, hoarseness of the voice,hair loss, any decreased concentration, or swelling around the eyes. no menstrual irregularity  Does have ongoing depression, but states it is improving.       Past Medical History   Diagnosis Date   . Infectious Mononucleosis 08/2006         Past Surgical History   Procedure Date   . No past surgical history          Family History   Problem Relation   . Ovarian Cancer Neg Hx     but sister w/ovarian cysts age 88   . Breast Cancer Mother   . Other Sister     Uterine fibroids/hysterectomy       History   Social History   . Marital Status: Married     Spouse Name: N/A     Number of Children: N/A   . Years of Education: N/A   Occupational History   . Not on file.   Social History Main Topics   . Tobacco Use: Never   . Alcohol Use: Yes      1 bottle wine /week   . Drug Use: No   . Sexually Active: Not Currently   Other Topics Concern   . Not on file   Social History Narrative   . No narrative on file       Current outpatient prescriptions   Medication Sig Dispense Refill   . levothyroxine (SYNTHROID) 112 MCG tablet Take 1 Tab by mouth daily.  30 Tab  0   . ibuprofen (IBU) 600 MG tablet Take 600 mg by mouth every 6 hours as needed.       Marland Kitchen atenolol (TENORMIN) 25 MG tablet Take 1 Tab by mouth daily.  30  3   . methimazole (TAPAZOLE) 10 MG tablet Take 1 Tab by mouth daily.  0  0   . cetirizine (ZYRTEC) 10 MG tablet Take 10 mg by mouth daily.           Review of patient's allergies indicates no known allergies.      Review of Systems -   Constitutional: + fatigue, no weight loss, fever, night  sweats.  HEENT: none   CV: no chest pain, heart palpitations  Resp: negative for cough, shortness of breath, wheezing  GI: negative for vomiting, occ abdominal pain,no diarrhea  GU: No dysuria, urgency, frequency, or nocturia  Musculoskeletal:none  Neuro: No dizziness, syncope,  derm: no unusual rashes, moles, pigmentation changes  Psych: depression    Blood pressure 112/68, pulse 78, temperature 98 F (36.7 C), temperature source Oral, resp. rate 15, height 5\' 10"  (1.778 m), weight 170 lb (77.111 kg).  Gen: AAOx3 NAD  HEENT: EOMI PERRLA. No exopthalmos  Neck: negative adenopathy. thyroid nonpalpable,  Heart: RRR no murmurs, rubs, or gallops  Lungs: CTA B/L no wheezes, rales, or ronchi  Ext: no edema. peripheral pulses intact. No tremors noted B/l  Neuro: CNII-XII intact.   Skin:  no hyperpigmenation noted  DTR's: 2/4 B/L KJ, triceps, Achilles      Results for orders placed in visit on 06/27/08   FREE THYROXINE, BLOOD   Component Value Range   . FREE T4 0.90 (*) 0.93 - 1.70 (ng/dL)   TOTAL T3, BLOOD   Component Value Range   . T3 TOTAL 0.8  0.8 - 2.0 (ng/mL)   TSH, BLOOD   Component Value Range   . TSH 5.75 (*) 0.27 - 4.20 (ulU/mL)   BASIC METABOLIC PANEL, BLOOD   Component Value Range   . GLUCOSE 85   > 70 - 115 (mg/dL)   . BUN 13  6 - 20 (mg/dL)   . CREATININE 0.79  0.51 - 0.95 (mg/dL)   . GFR (NON-AFRICAN AMER.) >60  (mL/min)   . GFR (AFRICAN AMER.) >60  (mL/min)   . SODIUM 139  136 - 145 (mmol/L)   . POTASSIUM 4.0  3.5 - 5.1 (mmol/L)   . CHLORIDE 100  98 - 107 (mmol/L)   . BICARBONATE 28  22 - 29 (mmol/L)   . CALCIUM 10.6 (*) 8.6 - 10.5 (mg/dL)         A/P 43 year old female with  1. Grave's Disease (242.00)   -now hypothyorid s/p I131, continue synthorid 112 mcg. Clinically euthyroid, will check lab test in 6 weeks to confirm. Pt will followup at that time TSH, BLOOD   2. Fatigue (780.79) --believe fatigue is multifactorial: depression, thyroid abnormalities, but will check 8 AM cortisol to check for any  adrenal issues as per pts request CORTISOL, BLOOD      25 minutes of face to face time was spent with patient  >50% of time was spent counseling patient on above issues   All of the patients questions were answered and patient verbalized understanding and agreement of the above plan  The past medical history, surgical history, family history, and social history were reviewed and updated  I have reviewed recent medical encounters, lab data, imaging data, and procedures

## 2008-07-10 ENCOUNTER — Encounter (INDEPENDENT_AMBULATORY_CARE_PROVIDER_SITE_OTHER): Payer: Self-pay | Admitting: "Endocrinology

## 2008-07-11 NOTE — Telephone Encounter (Signed)
Dr. Hofflich please advise.

## 2008-07-11 NOTE — Telephone Encounter (Signed)
 From: [name]   To: [name]   Sent: Sun Jul 10, 2008 6:22 PM   Subject: 1-Non Urgent Medical Advice    Hi Dr. Sigurd Sos,    I've been incredibly tired the past 2 days and tonight (Sunday) started to feel dizzy after mild activity (a short walk and some household chores). Is this an indication that I'm taking too much levothyroxine, or just the normal adjustment to the medication (started taking it 2/12).     Also, I take 1000mg  of calcium and 500mg  of magnesium at night (take the levothyroxine in the morning). Would it help to cut the calcium and magnesium amount in half?    Thank you for your help.    [name]

## 2008-07-27 ENCOUNTER — Other Ambulatory Visit (INDEPENDENT_AMBULATORY_CARE_PROVIDER_SITE_OTHER)

## 2008-07-27 ENCOUNTER — Telehealth (INDEPENDENT_AMBULATORY_CARE_PROVIDER_SITE_OTHER): Payer: Self-pay | Admitting: "Endocrinology

## 2008-07-27 ENCOUNTER — Other Ambulatory Visit (INDEPENDENT_AMBULATORY_CARE_PROVIDER_SITE_OTHER): Payer: Self-pay | Admitting: "Endocrinology

## 2008-07-27 NOTE — Telephone Encounter (Signed)
 Patient having heart palpitation, trouble sleeping, more frequent bowel movement, but also very fatigued, hands are always cold    Has been taking synthroid for 24 days. Advised to come in for TSH check

## 2008-07-27 NOTE — Telephone Encounter (Signed)
 Pt would like to have her synthroid dosage lower, becasue she is having the hypothyroid symptoms again. Pls call and advise

## 2008-07-27 NOTE — Telephone Encounter (Signed)
 Will send to Dr.Hofflich to advise.

## 2008-07-28 ENCOUNTER — Telehealth (INDEPENDENT_AMBULATORY_CARE_PROVIDER_SITE_OTHER): Payer: Self-pay | Admitting: "Endocrinology

## 2008-07-28 MED ORDER — LEVOTHYROXINE SODIUM 100 MCG OR TABS
100.0000 ug | ORAL_TABLET | Freq: Every day | ORAL | Status: DC
Start: 2008-07-28 — End: 2008-12-20

## 2008-07-28 NOTE — Telephone Encounter (Signed)
 TSh 0.09, dec synthroid to 100 mcg once daily  Will see aptient in 2 weeks TSH at that time

## 2008-08-01 ENCOUNTER — Other Ambulatory Visit (INDEPENDENT_AMBULATORY_CARE_PROVIDER_SITE_OTHER): Payer: Self-pay | Admitting: Internal Medicine

## 2008-08-03 ENCOUNTER — Encounter (INDEPENDENT_AMBULATORY_CARE_PROVIDER_SITE_OTHER): Payer: Self-pay | Admitting: "Endocrinology

## 2008-08-03 NOTE — Telephone Encounter (Signed)
 From: [name]   To: [name]   Sent: Wed Aug 03, 2008 2:41 PM   Subject: 2-Procedural Question    Hi Dr. Sigurd Sos,    Since I tested as hyperthyroid last week, I'm not sure I understand why I should continue taking the synthroid. Wouldn't that make me more hyperthyroid? Wondering if I should stop taking the synthroid for a period of time. I've been taking the lower dose since Friday (3/12).    Thank you,  Brittany [name]

## 2008-08-03 NOTE — Telephone Encounter (Signed)
 Dr. [facility] please advise. Tks

## 2008-08-09 ENCOUNTER — Other Ambulatory Visit (INDEPENDENT_AMBULATORY_CARE_PROVIDER_SITE_OTHER)

## 2008-08-09 ENCOUNTER — Other Ambulatory Visit (INDEPENDENT_AMBULATORY_CARE_PROVIDER_SITE_OTHER): Payer: Self-pay | Admitting: "Endocrinology

## 2008-08-15 ENCOUNTER — Encounter (INDEPENDENT_AMBULATORY_CARE_PROVIDER_SITE_OTHER): Payer: Self-pay | Admitting: "Endocrinology

## 2008-08-15 NOTE — Telephone Encounter (Signed)
Dr.Hofflich please advise if ok

## 2008-08-15 NOTE — Telephone Encounter (Signed)
 From: [name]   To: [name]   Sent: Mon Aug 15, 2008 9:57 AM   Subject: 2-Procedural Question    Dear Dr. Sigurd Sos,    I am writing to request an extension of my medical leave/telecommuting status, which ends March 31. I have an appointment with you on April 8, but am still experiencing heart palpitations, light-headedness, and fatigue. I started the lower dose of synthroid on March 12. If possible, please send a letter (example below) extending my FMLA to April 30. [facility]'s fax number is [phone].    Thank you for considering this. Here is a sample letter:    Brittany [name]  [address]  [place] [address]    MRN: [id number]    TO: Freddy Finner, [facility] - [facility]    [name] is under my care for Graves' disease and is adjusting to a new medication. 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

## 2008-08-17 ENCOUNTER — Telehealth (INDEPENDENT_AMBULATORY_CARE_PROVIDER_SITE_OTHER): Payer: Self-pay | Admitting: "Endocrinology

## 2008-08-17 NOTE — Telephone Encounter (Signed)
 Done, please fAX as above

## 2008-08-18 ENCOUNTER — Ambulatory Visit (INDEPENDENT_AMBULATORY_CARE_PROVIDER_SITE_OTHER): Admitting: Internal Medicine

## 2008-08-18 ENCOUNTER — Encounter (INDEPENDENT_AMBULATORY_CARE_PROVIDER_SITE_OTHER): Payer: Self-pay | Admitting: "Endocrinology

## 2008-08-18 VITALS — BP 120/72 | HR 69 | Temp 97.8°F | Resp 16 | Ht 70.0 in | Wt 171.0 lb

## 2008-08-18 MED ORDER — IBUPROFEN 800 MG OR TABS
800.0000 mg | ORAL_TABLET | Freq: Three times a day (TID) | ORAL | Status: DC | PRN
Start: 2008-08-18 — End: 2010-05-10

## 2008-08-18 NOTE — Progress Notes (Signed)
 The patient states that she stubbed her right fifth toe approximately 4 days ago with pain and swelling proximal to the toe since that time. She has noticed some bruising as well on the lateral aspect of the toe. In addition the patient complains of having menstrual cramps and is requesting treatment for this. She denies any symptoms of a urinary tract infection and has no GI complaints.    Past Medical History   Diagnosis Date   . Infectious Mononucleosis 08/2006         Current outpatient prescriptions   Medication Sig        . levothyroxine (SYNTHROID) 100 MCG tablet Take 1 Tab by mouth daily.   Marland Kitchen levothyroxine (SYNTHROID) 112 MCG tablet Take 1 Tab by mouth daily.   Marland Kitchen DISCONTD: ibuprofen (IBU) 600 MG tablet Take 600 mg by mouth every 6 hours as needed.   Marland Kitchen atenolol (TENORMIN) 25 MG tablet Take 1 Tab by mouth daily.   . methimazole (TAPAZOLE) 10 MG tablet Take 1 Tab by mouth daily.   . cetirizine (ZYRTEC) 10 MG tablet Take 10 mg by mouth daily.         No Known Allergies    ROS--    No symptoms of hypo or hyperthyroidism: no decreased or increased weight, no feeling cold/chilly or excessively warm, no diarrhea or constipation, no undue sweatiness, anxiety or palpitations.    The patient denies cough, chest pain, dyspnea, wheezing or hemoptysis.    Patient denies any exertional chest pain, dyspnea, palpitations, syncope, orthopnea, edema or paroxysmal nocturnal dyspnea.    The patient denies abdominal or flank pain, anorexia, nausea or vomiting, dysphagia, change in bowel habits or black or bloody stools or weight loss.    The patient denies swelling, numbness, tingling or weakness in the extremities.    The patient denies dysuria, frequency or hematuria.      PE--    She appears well, in no apparent distress.  Alert and oriented times three, pleasant and cooperative. Vital signs are as noted by the nurse.    Filed Vitals:    08/18/2008  1:26 PM   BP: 120/72   Pulse: 69   Temp: 97.8 F (36.6 C)   TempSrc: Oral    Resp: 16   Height: 5\' 10"  (1.778 m)   Weight: 171 lb (77.565 kg)         Chest is clear, no wheezing or rales. Normal symmetric air entry throughout both lung fields. No chest wall deformities or tenderness.    S1 and S2 normal, no murmurs, clicks, gallops or rubs. Regular rate and rhythm. Chest is clear; no wheezes or rales. No edema or JVD.    The abdomen is soft without tenderness, guarding, mass, rebound or organomegaly. Bowel sounds are normal. No CVA tenderness or inguinal adenopathy noted.    Extremities: extremities, peripheral pulses and reflexes normal, no edema, redness or tenderness in the calves or thighs, feet normal, good pulses, normal color, temperature and sensation.  Right foot with ecchymosis of the fifth toe and tenderness proximal to the MTP joint.    A/P--    1. Right Foot Pain (729.5)--need to rule out fracture. Will check x-rays and if there is a fracture present in the metatarsal, will place an air cast.  X-RAY FOOT RIGHT SIDE   2. Dysmenorrhea (625.3)--I have prescribed ibuprofen for the patient.  ibuprofen (MOTRIN) 800 MG tablet

## 2008-08-18 NOTE — Telephone Encounter (Signed)
 Done- Form faxed. Pt notified through Mychart.

## 2008-08-18 NOTE — Telephone Encounter (Signed)
 From: [name]   To: [name]   Sent: Thu Aug 18, 2008 11:18 AM   Subject: 2-Procedural Question    Re: Letter    Hi Rose,    Thank you very much! Please also mail the original to my home address.    Joanne Chars    -----  Hello there!  I just want to let you know that Dr. Sigurd Sos has made the letter for you. I will fax it over to [facility] at [phone]. Please let me know if you have any questions.  Okey Dupre

## 2008-08-18 NOTE — Telephone Encounter (Signed)
 Also, please call pt and tell her letter wsas FAXED. thanks

## 2008-08-23 ENCOUNTER — Other Ambulatory Visit (INDEPENDENT_AMBULATORY_CARE_PROVIDER_SITE_OTHER): Payer: Self-pay | Admitting: Internal Medicine

## 2008-08-23 ENCOUNTER — Other Ambulatory Visit (INDEPENDENT_AMBULATORY_CARE_PROVIDER_SITE_OTHER)

## 2008-08-23 ENCOUNTER — Other Ambulatory Visit (INDEPENDENT_AMBULATORY_CARE_PROVIDER_SITE_OTHER): Payer: Self-pay | Admitting: "Endocrinology

## 2008-08-25 ENCOUNTER — Ambulatory Visit (INDEPENDENT_AMBULATORY_CARE_PROVIDER_SITE_OTHER): Admitting: "Endocrinology

## 2008-08-25 VITALS — BP 122/74 | HR 68 | Temp 98.6°F | Resp 15 | Ht 70.0 in | Wt 171.0 lb

## 2008-08-25 NOTE — Progress Notes (Signed)
 Chief Complaint   Patient presents with   . Thyroid Problem     f/u         Pt is a 43 year old female here for followup for    43 yo female with Grave's s/p I131, now has been on synthorid 100 mcg for 1 month and thyroid tests are normal. Sleeps 7-8 hours.  Patient feels better overall, but still with fatigue, has hot/cold symptoms.  occ heart palpitations  No constipatin, no cold intolerance.    Patient reports she is anxious about her job and work. Feels like she has to go out and exercise more.  No blurry vision, no double vision.   Takes synthorid first thing in am, 1 hr before meals    Past Medical History   Diagnosis Date   . Infectious Mononucleosis 08/2006         Past Surgical History   Procedure Date   . No past surgical history          Family History   Problem Relation   . Ovarian Cancer Neg Hx     but sister w/ovarian cysts age 77   . Breast Cancer Mother   . Other Sister     Uterine fibroids/hysterectomy       History   Social History   . Marital Status: Married     Spouse Name: N/A     Number of Children: N/A   . Years of Education: N/A   Occupational History   . Not on file.   Social History Main Topics   . Tobacco Use: Never   . Alcohol Use: Yes      1 bottle wine /week   . Drug Use: No   . Sexually Active: Not Currently   Other Topics Concern   . Not on file   Social History Narrative   . No narrative on file       Current outpatient prescriptions   Medication Sig Dispense Refill   . ibuprofen (MOTRIN) 800 MG tablet Take 1 Tab by mouth every 8 hours as needed for Mild Pain.  60 Tab  1   . levothyroxine (SYNTHROID) 100 MCG tablet Take 1 Tab by mouth daily.  30 Tab  6   . levothyroxine (SYNTHROID) 112 MCG tablet Take 1 Tab by mouth daily.  30 Tab  0   . atenolol (TENORMIN) 25 MG tablet Take 1 Tab by mouth daily.  30  3   . methimazole (TAPAZOLE) 10 MG tablet Take 1 Tab by mouth daily.  0  0   . cetirizine (ZYRTEC) 10 MG tablet Take 10 mg by mouth daily.           Review of patient's allergies  indicates no known allergies.      Review of Systems -   Constitutional: negative for fatigue, weight loss, fever, night sweats.  HEENT: none   CV: no chest pain, heart palpitations  Resp: negative for cough, shortness of breath, wheezinge,  derm: no unusual rashes, moles, pigmentation changes  Psych: +stress/depression    Blood pressure 122/74, pulse 68, temperature 98.6 F (37 C), temperature source Oral, resp. rate 15, height 5\' 10"  (1.778 m), weight 171 lb (77.565 kg).  Gen: AAOx3 NAD  HEENT: EOMI PERRLA. No lid lag, no exopathalmos  Neck: negative adenopathy. thyroid nonpalpable,  Heart: RRR no murmurs, rubs, or gallops  Lungs: CTA B/L no wheezes, rales, or ronch  Ext: no edema. peripheral pulses intact  DTR's:  2/4 B/L KJ, triceps, Achilles. No tremors B.L      Results for orders placed in visit on 08/23/08   TSH, BLOOD   Component Value Range   . TSH 2.00  0.27 - 4.20 (ulU/mL)   FREE THYROXINE, BLOOD   Component Value Range   . FREE T4 1.49  0.93 - 1.70 (ng/dL)         A/P 43 year old female with  1. HYPOTHYROID (244.9)     S//p M0102 for Grave;s disease  Patient now clinically and biochemically euthyroid on 100 mcg of synthorid.  Continue current medications    -I explained to patient in great length about the symptoms of both hypo and hyper thyroidism and symptoms to look out for to ensure she is on the correct dose    -We also discussed making sure that patient is on the same generic brand at all times as there is a 20% variability between different brands of thyroid medication    -We discussed taking the medicine 30 min -1 hour before eating and not within two hours of any calcium or iron supplements  TSH, BLOOD, FREE THYROXINE, BLOOD        25 minutes of face to face time was spent with patient  >50% of time was spent counseling patient on above issues   All of the patients questions were answered and patient verbalized understanding and agreement of the above plan  The past medical history, surgical  history, family history, and social history were reviewed and updated  I have reviewed recent medical encounters, lab data, imaging data, and procedures

## 2008-09-02 ENCOUNTER — Other Ambulatory Visit (INDEPENDENT_AMBULATORY_CARE_PROVIDER_SITE_OTHER): Payer: Self-pay | Admitting: "Endocrinology

## 2008-09-02 ENCOUNTER — Other Ambulatory Visit (INDEPENDENT_AMBULATORY_CARE_PROVIDER_SITE_OTHER)

## 2008-09-05 ENCOUNTER — Encounter (INDEPENDENT_AMBULATORY_CARE_PROVIDER_SITE_OTHER): Payer: Self-pay | Admitting: "Endocrinology

## 2008-09-06 ENCOUNTER — Encounter (INDEPENDENT_AMBULATORY_CARE_PROVIDER_SITE_OTHER): Payer: Self-pay | Admitting: Internal Medicine

## 2008-09-06 NOTE — Telephone Encounter (Signed)
 From: [name]   To: Patric Dykes   Sent: Tue Sep 06, 2008 2:43 PM   Subject: 3-Referral Request Status    Dear Dr. Pattricia Boss,    Last year I had a hearing test at the [facility]; the audiologist, Bishop Dublin, recommended that I get a hearing test annually. Since I have an HMO, the Audiology department requires a referral from my primary care physician.     Please issue a referral to the [facility] for a hearing test.    Thank you!    Brittany [name]

## 2008-09-06 NOTE — Telephone Encounter (Signed)
Dr Farber, please sign referral if okay. Thanks.

## 2008-09-16 ENCOUNTER — Telehealth (INDEPENDENT_AMBULATORY_CARE_PROVIDER_SITE_OTHER): Payer: Self-pay | Admitting: "Endocrinology

## 2008-09-16 ENCOUNTER — Other Ambulatory Visit (INDEPENDENT_AMBULATORY_CARE_PROVIDER_SITE_OTHER): Payer: Self-pay | Admitting: "Endocrinology

## 2008-09-16 ENCOUNTER — Other Ambulatory Visit (INDEPENDENT_AMBULATORY_CARE_PROVIDER_SITE_OTHER)

## 2008-09-16 NOTE — Telephone Encounter (Signed)
Patient is feeling heart palpitations, night sweats.  Menses are heavy and regular.  TSH is 4.02, will keep Thyroid the same.  Will see pt in 1 week to discuss what else could be causing this.

## 2008-09-21 ENCOUNTER — Other Ambulatory Visit (INDEPENDENT_AMBULATORY_CARE_PROVIDER_SITE_OTHER)

## 2008-09-21 ENCOUNTER — Other Ambulatory Visit (INDEPENDENT_AMBULATORY_CARE_PROVIDER_SITE_OTHER): Payer: Self-pay | Admitting: "Endocrinology

## 2008-09-22 ENCOUNTER — Ambulatory Visit (INDEPENDENT_AMBULATORY_CARE_PROVIDER_SITE_OTHER): Admitting: "Endocrinology

## 2008-09-22 VITALS — BP 122/70 | HR 68 | Temp 98.6°F | Resp 15 | Ht 70.0 in | Wt 171.0 lb

## 2008-09-22 MED ORDER — LEVOTHYROXINE SODIUM 112 MCG OR TABS
112.0000 ug | ORAL_TABLET | Freq: Every day | ORAL | Status: AC
Start: 2008-09-22 — End: 2008-11-18

## 2008-09-22 NOTE — Patient Instructions (Signed)
 Patient will take synthroid 112 mcg on Saturday, and 100 mcg on every day

## 2008-09-22 NOTE — Progress Notes (Signed)
 Chief Complaint   Patient presents with   . Thyroid Problem         Pt is a 43 year old female  here today for     Patient is here today for followup for her hypothyroidism status post I-131 therapy for Graves' disease. Patient has been on Synthroid therapy on a dose of 112 MCG's which was recently lowered to 100 MCG is after the thyroid symptoms and numbers returning to the hyperthyroid side patient today is noted to have any elevated TSH of 6 she had an upper respiratory tract infection last week and she feels much better today no fatigue she feels better than she has in the past    Patient denies weight gain, fatigue, dry skin, cold intolerance,  constipation, hoarseness of the voice, depression, hair loss, any decreased concentration, or swelling around the eyes.   Past Medical History   Diagnosis Date   . Infectious Mononucleosis 08/2006         Past Surgical History   Procedure Date   . No past surgical history          Family History   Problem Relation   . Ovarian Cancer Neg Hx     but sister w/ovarian cysts age 71   . Breast Cancer Mother   . Other Sister     Uterine fibroids/hysterectomy       History   Social History   . Marital Status: Married     Spouse Name: N/A     Number of Children: N/A   . Years of Education: N/A   Occupational History   . Not on file.   Social History Main Topics   . Tobacco Use: Never   . Alcohol Use: Yes      1 bottle wine /week   . Drug Use: No   . Sexually Active: Not Currently   Other Topics Concern   . Not on file   Social History Narrative   . No narrative on file       Current outpatient prescriptions   Medication Sig Dispense Refill   . levothyroxine (SYNTHROID) 112 MCG tablet Take 1 Tab by mouth daily.  30 Tab  6   . ibuprofen (MOTRIN) 800 MG tablet Take 1 Tab by mouth every 8 hours as needed for Mild Pain.  60 Tab  1   . levothyroxine (SYNTHROID) 100 MCG tablet Take 1 Tab by mouth daily.  30 Tab  6   . cetirizine (ZYRTEC) 10 MG tablet Take 10 mg by mouth daily.                  Review of patient's allergies indicates no known allergies.    herbal meds:    Review of Systems -   Constitutional: negative for fatigue, weight loss, fever, night sweats.  HEENT: no visual changes, headaches,blurred vision, tinnitus, eye pain, ear pain  Neck: no stiffness, pain, injury  CV: negative for palpitations,  chest pain, orthopnea, lower extremity edema.  Muculoskeletal: No myalgia, joint stiffness, edema, muscular weakness  Psych: Denies depression, agitation, anxiety, hallucinations    Blood pressure 122/70, pulse 68, temperature 98.6 F (37 C), temperature source Oral, resp. rate 15, height 5\' 10"  (1.778 m), weight 171 lb (77.565 kg).  Gen: AAOx3 NAD  HEENT: EOMI PERRLA  Neck: negative adenopathy. thyroid nonpalpable,    Heart: RRR no murmurs, rubs, or gallops  Lungs: CTA B/L no wheezes, rales, or ronchi  Ext: no edema. peripheral  pulses+ DP/PT no tremors bilaterally  DTR's 2/4 B?l      Results for orders placed in visit on 09/21/08   TSH, BLOOD   Component Value Range   . TSH 6.45 (*) 0.27 - 4.20 (ulU/mL)   HEMOGRAM, BLOOD   Component Value Range   . WBC 5.4  4.0 - 11.0 (1000/mm3)   . RBC 3.95 (*) 4.00 - 5.00 (mill/mm3)   . HGB 12.3  12.0 - 16.0 (gm/dL)   . HCT 37.3  36.0 - 46.0 (%)   . MCV 94.6  82.0 - 98.0 (um3)   . MCH 31.1 (*) 27 - 31 (pgm)   . MCHC 32.9  32 - 37 (%)   . RDW 12.1  10 - 15 (%)   . PLT COUNT 225  130 - 400 (1000/mm3)   . MPV 11.7 (*) 7.4 - 10.4 (fl)   CALCIUM, BLOOD   Component Value Range   . CALCIUM 9.5  8.6 - 10.5 (mg/dL)         A/P 43 year old female with  1. ACQUIRED HYPOTHYROIDISM (244.9)   The plan today is that we will continue Synthroid 100 MCG 6 days a week and on Saturday the patient will take 112 MCG of Synthroid we will recheck the TSH in one month to 6 weeks or depending on patient's symptomatology I will see her again following the TSH was  TSH, BLOOD, FREE THYROXINE, BLOOD   2. HYPOTHYROID (244.9)  levothyroxine (SYNTHROID) 112 MCG tablet      25 minutes of  face to face time was spent with patient  >50% of time was spent counseling patient on above issues   All of the patients questions were answered and patient verbalized understanding and agreement of the above plan  I have reviewed the most recent medical encounters, lab data, imaging data, and procedures.  The past medical history, surgical history, family history, and social history were reviewed with the patient and updated.

## 2008-09-22 NOTE — Progress Notes (Signed)
 Chief Complaint:   Brittany [name] is a 43 year old female who presents for optimization of thyroid medication.    History of Present Illness:      Pt is a 43 y/o F with h/o Graves disease s/p ablation, with subsequent hypothyroid, who has recently been on synthroid, with fluctuating TST's.  During her last visit, labs showed evidence of slight hyperthyroid, and thus her synthroid dose was decreased from to .  Since then she has generally been feeling better, with less palpitations, although she does report a continued baseline of fatigue.  Last week she had a viral illness, during which she felt worse, but is now improved.  She reports taking left-over synthroid tabs for 3 days during the last week when she felt increased fatigue, but this quickly made her feel irritable/anxious again.  Pt reports ~3 BMs of normal consistency each day. Also having heavier menses now.    Past Medical History   Diagnosis Date   . Infectious Mononucleosis 08/2006   -Graves disease      Current outpatient prescriptions   Medication Sig Dispense Refill   . ibuprofen (MOTRIN) 800 MG tablet Take 1 Tab by mouth every 8 hours as needed for Mild Pain.  60 Tab  1   . levothyroxine (SYNTHROID) 100 MCG tablet Take 1 Tab by mouth daily.  30 Tab  6   . cetirizine (ZYRTEC) 10 MG tablet Take 10 mg by mouth daily.           No Known Allergies    Review Of Systems: as above, otherwise negative    PHYSICAL EXAMINATION:  BP 122/70  Pulse 68  Temp(Src) 98.6 F (37 C) (Oral)  Resp 15  Ht 5\' 10"  (1.778 m)  Wt 171 lb (77.565 kg)  Gen: A&O, NAD  HEENT: NCAT, OP clear, MMM, no thyromegaly  CV: RRR, no M/G/R  Pulm: CTA B, no W/C  UJW:JXBJ, NT/ND, BS+  Ext: no C/C/E    Results for orders placed in visit on 09/21/08   TSH, BLOOD   Component Value Range   . TSH 6.45 (*) 0.27 - 4.20 (ulU/mL)   HEMOGRAM, BLOOD   Component Value Range   . WBC 5.4  4.0 - 11.0 (1000/mm3)   . RBC 3.95 (*) 4.00 - 5.00 (mill/mm3)   . HGB 12.3  12.0 - 16.0  (gm/dL)   . HCT 37.3  36.0 - 46.0 (%)   . MCV 94.6  82.0 - 98.0 (um3)   . MCH 31.1 (*) 27 - 31 (pgm)   . MCHC 32.9  32 - 37 (%)   . RDW 12.1  10 - 15 (%)   . PLT COUNT 225  130 - 400 (1000/mm3)   . MPV 11.7 (*) 7.4 - 10.4 (fl)   CALCIUM, BLOOD   Component Value Range   . CALCIUM 9.5  8.6 - 10.5 (mg/dL)       ASSESSMENT/PLAN:  43 y/o F with h/o Grave's disease s/p ablation now with hypothyroid.  1. Pt's required dose of synthroid appears to be somewhere in between 100 and 112 mcg.  Pt advised to take once a week on Saturdays, and 100 the rest of the week.  We will repeat TST's in a month and adjust her regimen as needed.  2. Menses - discussed that heavier menses are likely secondary to current hypothyroid state, and should improve/regulate with better thyroid balance.  3. F/u in 4 weeks.

## 2008-09-28 ENCOUNTER — Ambulatory Visit (INDEPENDENT_AMBULATORY_CARE_PROVIDER_SITE_OTHER): Admitting: Audiology

## 2008-10-01 ENCOUNTER — Encounter (INDEPENDENT_AMBULATORY_CARE_PROVIDER_SITE_OTHER): Payer: Self-pay | Admitting: "Endocrinology

## 2008-10-04 ENCOUNTER — Telehealth (INDEPENDENT_AMBULATORY_CARE_PROVIDER_SITE_OTHER): Payer: Self-pay | Admitting: Internal Medicine

## 2008-10-04 ENCOUNTER — Telehealth (INDEPENDENT_AMBULATORY_CARE_PROVIDER_SITE_OTHER): Payer: Self-pay

## 2008-10-04 ENCOUNTER — Encounter (INDEPENDENT_AMBULATORY_CARE_PROVIDER_SITE_OTHER): Payer: Self-pay | Admitting: "Endocrinology

## 2008-10-04 NOTE — Telephone Encounter (Signed)
Dr Farber, please sign Referral  if okay. Thanks.

## 2008-10-04 NOTE — Telephone Encounter (Signed)
 Sent pt a Mychart message stating that, I will be faxing over the letter made by Dr.Hofflich to Retia Passe at [phone].

## 2008-10-04 NOTE — Telephone Encounter (Signed)
 Patient recently has audiogram done and has been recommended for hearing aids.   Please placed DME order.

## 2008-10-04 NOTE — Telephone Encounter (Signed)
 Order has been signed.

## 2008-10-05 NOTE — Telephone Encounter (Signed)
 Approved. Auth and order have been faxed to [facility].     [facility]  [address]   [place], [place] [address]  [phone]    Patient can call to arrange appt and fitting.

## 2008-10-05 NOTE — Telephone Encounter (Signed)
Called and spoke with patient, informed of approval of hearing aid and advised to call [facility] to arrange appointment for fitting. Patient verbalized good understanding.  Matter resolved.

## 2008-10-14 ENCOUNTER — Other Ambulatory Visit (INDEPENDENT_AMBULATORY_CARE_PROVIDER_SITE_OTHER): Payer: Self-pay | Admitting: "Endocrinology

## 2008-10-14 ENCOUNTER — Other Ambulatory Visit (INDEPENDENT_AMBULATORY_CARE_PROVIDER_SITE_OTHER)

## 2008-11-01 ENCOUNTER — Other Ambulatory Visit (INDEPENDENT_AMBULATORY_CARE_PROVIDER_SITE_OTHER)

## 2008-11-01 ENCOUNTER — Encounter (HOSPITAL_BASED_OUTPATIENT_CLINIC_OR_DEPARTMENT_OTHER): Admitting: Audiologist

## 2008-11-01 ENCOUNTER — Other Ambulatory Visit (INDEPENDENT_AMBULATORY_CARE_PROVIDER_SITE_OTHER): Payer: Self-pay | Admitting: Otolaryngology

## 2008-11-01 ENCOUNTER — Telehealth (INDEPENDENT_AMBULATORY_CARE_PROVIDER_SITE_OTHER): Payer: Self-pay

## 2008-11-01 ENCOUNTER — Other Ambulatory Visit (INDEPENDENT_AMBULATORY_CARE_PROVIDER_SITE_OTHER): Payer: Self-pay | Admitting: "Endocrinology

## 2008-11-01 NOTE — Telephone Encounter (Signed)
 done

## 2008-11-01 NOTE — Telephone Encounter (Signed)
 Sonny from [facility] lab requesting lab orders for pt to have TSh and Free T4..    Orders signed will send to Dr. Sigurd Sos to co-sign.

## 2008-11-02 ENCOUNTER — Telehealth (INDEPENDENT_AMBULATORY_CARE_PROVIDER_SITE_OTHER): Payer: Self-pay | Admitting: "Endocrinology

## 2008-11-02 NOTE — Telephone Encounter (Signed)
 Will send to Dr. Sigurd Sos for results letter and interpretation on labs done on June 15,2010.

## 2008-11-02 NOTE — Telephone Encounter (Signed)
 Spoke to pt and informed her that her TSH results are normal. Pt verbalizes and understands and she saw the results through Mychart.    Pt has 2 questions.    1. Does she have to continue taking 112 mcg on Saturdays.    2. Does she have to repeat her TSH again prior to next office visit on July 8,2010.    Dr. [facility] please advise. Tks

## 2008-11-02 NOTE — Telephone Encounter (Signed)
 They were sent through mychart and I released them, but OK to send letter.   They were normal

## 2008-11-02 NOTE — Telephone Encounter (Signed)
 Pt requesting Lab results that Dr Sigurd Sos ordered.

## 2008-11-02 NOTE — Telephone Encounter (Signed)
 Yes, continue current dose. No labs before next visit

## 2008-11-03 NOTE — Telephone Encounter (Signed)
 Spoke with patient regarding physicians response.  Patient verbalized a good understanding of the conversation.

## 2008-11-16 ENCOUNTER — Ambulatory Visit (INDEPENDENT_AMBULATORY_CARE_PROVIDER_SITE_OTHER): Admitting: Internal Medicine

## 2008-11-16 MED ORDER — ZOLPIDEM TARTRATE 10 MG OR TABS
10.0000 mg | ORAL_TABLET | Freq: Every evening | ORAL | Status: DC | PRN
Start: 2008-11-16 — End: 2011-09-18

## 2008-11-16 NOTE — Progress Notes (Addendum)
 AVS instructions given to patient  post clinic visit with good understanding.

## 2008-11-16 NOTE — Progress Notes (Signed)
 The patient returns for followup of hypothyroidism status post I-131 ablation for Graves' disease and GERD.  She states that her thyroid is now well controlled on her current dose of Synthroid, however she has had recent difficulties with anxiety and insomnia due to having been laid off from her job.  She has been using half of a Valium that she had left over in order to be able to sleep on occasion. She denies any reflux symptoms.    Past Medical History   Diagnosis Date   . Infectious Mononucleosis 08/2006         Current outpatient prescriptions   Medication Sig   . zolpidem (AMBIEN) 10 MG tablet Take 1 Tab by mouth nightly as needed for Insomnia.   Marland Kitchen levothyroxine (SYNTHROID) 112 MCG tablet Take 1 Tab by mouth daily.   Marland Kitchen ibuprofen (MOTRIN) 800 MG tablet Take 1 Tab by mouth every 8 hours as needed for Mild Pain.   Marland Kitchen levothyroxine (SYNTHROID) 100 MCG tablet Take 1 Tab by mouth daily.   . cetirizine (ZYRTEC) 10 MG tablet Take 10 mg by mouth daily.         No Known Allergies    ROS--    No symptoms of hypo or hyperthyroidism: no decreased or increased weight, no feeling cold/chilly or excessively warm, no diarrhea or constipation, no undue sweatiness, or palpitations.    The patient denies cough, chest pain, dyspnea, wheezing or hemoptysis.    Patient denies any exertional chest pain, dyspnea, palpitations, syncope, orthopnea, edema or paroxysmal nocturnal dyspnea.    The patient denies abdominal or flank pain, anorexia, nausea or vomiting, dysphagia, change in bowel habits or black or bloody stools or weight loss.    The patient denies swelling, numbness, tingling or weakness in the extremities.    PE--    She appears well, in no apparent distress.  Alert and oriented times three, pleasant and cooperative. Vital signs are as noted by the nurse.    Filed Vitals:    11/16/2008  4:19 PM   BP: 108/66   Pulse: 96   Temp: 98.4 F (36.9 C)   TempSrc: Oral   Resp: 14   Weight: 172 lb (78.019 kg)         Thyroid not  palpable, not enlarged, no nodules detected.    Chest is clear, no wheezing or rales. Normal symmetric air entry throughout both lung fields. No chest wall deformities or tenderness.    S1 and S2 normal, no murmurs, clicks, gallops or rubs. Regular rate and rhythm. Chest is clear; no wheezes or rales. No edema or JVD.    The abdomen is soft without tenderness, guarding, mass, rebound or organomegaly. Bowel sounds are normal. No CVA tenderness or inguinal adenopathy noted.    Extremities: extremities, peripheral pulses and reflexes normal, no edema, redness or tenderness in the calves or thighs, feet normal, good pulses, normal color, temperature and sensation.    Results for orders placed in visit on 11/01/08   TSH, BLOOD   Component Value Range   . TSH 4.15  0.27 - 4.20 (ulU/mL)   FREE THYROXINE, BLOOD   Component Value Range   . FREE T4 1.39  0.93 - 1.70 (ng/dL)         A/P--    1. ACQUIRED HYPOTHYROIDISM (244.9)--the patient is currently euthyroid. She will followup with Dr. Sigurd Sos.     2. GERD (Gastroesophageal Reflux Disease) (530.81)--she has no reflux symptoms at the current time.  3. Insomnia (780.52)--situational in nature. Will provide Ambien for the time being.  zolpidem (AMBIEN) 10 MG tablet

## 2008-11-24 ENCOUNTER — Encounter (INDEPENDENT_AMBULATORY_CARE_PROVIDER_SITE_OTHER): Admitting: "Endocrinology

## 2008-12-08 ENCOUNTER — Other Ambulatory Visit (INDEPENDENT_AMBULATORY_CARE_PROVIDER_SITE_OTHER): Payer: Self-pay | Admitting: "Endocrinology

## 2008-12-08 ENCOUNTER — Other Ambulatory Visit (INDEPENDENT_AMBULATORY_CARE_PROVIDER_SITE_OTHER)

## 2008-12-08 LAB — FREE THYROXINE, BLOOD: Free T4: 1.41 ng/dL (ref 0.93–1.70)

## 2008-12-13 ENCOUNTER — Encounter (HOSPITAL_BASED_OUTPATIENT_CLINIC_OR_DEPARTMENT_OTHER): Admitting: Audiologist

## 2008-12-20 ENCOUNTER — Ambulatory Visit (INDEPENDENT_AMBULATORY_CARE_PROVIDER_SITE_OTHER): Admitting: "Endocrinology

## 2008-12-20 VITALS — BP 112/74 | HR 76 | Temp 98.2°F | Resp 15 | Wt 169.0 lb

## 2008-12-20 MED ORDER — LEVOTHYROXINE SODIUM 100 MCG OR TABS
100.0000 ug | ORAL_TABLET | Freq: Every day | ORAL | Status: DC
Start: 2008-12-20 — End: 2010-02-01

## 2008-12-20 MED ORDER — LEVOTHYROXINE SODIUM 112 MCG OR TABS
112.0000 ug | ORAL_TABLET | Freq: Every day | ORAL | Status: DC
Start: 2008-12-20 — End: 2009-05-17

## 2008-12-20 NOTE — Progress Notes (Signed)
 Chief Complaint   Patient presents with   . Thyroid Problem     f/u         Pt is a 43 year old female here for followup     Patient's TFT;'s are normal.  Patient is taking synthroid daily at 100 mcg, and then 112 mcg on Saturday.  Patient denies weight gain, fatigue, dry skin, cold intolerance,  constipation, hoarseness of the voice, depression, hair loss, any decreased concentration, or swelling around the eyes.     Past Medical History   Diagnosis Date   . Infectious Mononucleosis 08/2006         Past Surgical History   Procedure Date   . No past surgical history          Family History   Problem Relation Age of Onset   . Ovarian Cancer Neg Hx      but sister w/ovarian cysts age 30   . Breast Cancer Mother    . Other Sister      Uterine fibroids/hysterectomy       History   Social History   . Marital Status: Married     Spouse Name: N/A     Number of Children: N/A   . Years of Education: N/A   Occupational History   . Not on file.   Social History Main Topics   . Tobacco Use: Never   . Alcohol Use: Yes      1 bottle wine /week   . Drug Use: No   . Sexually Active: Not Currently   Other Topics Concern   . Not on file   Social History Narrative   . No narrative on file       Current outpatient prescriptions   Medication Sig Dispense Refill   . zolpidem (AMBIEN) 10 MG tablet Take 1 Tab by mouth nightly as needed for Insomnia.  30 Tab  2   . ibuprofen (MOTRIN) 800 MG tablet Take 1 Tab by mouth every 8 hours as needed for Mild Pain.  60 Tab  1   . levothyroxine (SYNTHROID) 100 MCG tablet Take 1 Tab by mouth daily.  30 Tab  6   . cetirizine (ZYRTEC) 10 MG tablet Take 10 mg by mouth daily.           Review of patient's allergies indicates no known allergies.      Review of Systems -   Constitutional: negative for fatigue, weight loss, fever, night sweats.  HEENT: none   CV: no chest pain, heart palpitations  Resp: negative for cough, shortness of breath, wheezing  GI: negative for vomiting, abdominal pain, or  diarrhea  GU: No dysuria, urgency, frequency, or nocturia  Musculoskeletal:none  Neuro: No dizziness, syncope,  derm: no unusual rashes, moles, pigmentation changes  Psych: none. No suicidal thoughts or ideations    Blood pressure 112/74, pulse 76, temperature 98.2 F (36.8 C), temperature source Oral, resp. rate 15, weight 169 lb (76.658 kg).  Gen: AAOx3 NAD  HEENT: EOMI PERRLA. No exophthalmos no lid lag  Neck: negative adenopathy. thyroid slightly enlarged approximately 30 g. No thyroid tenderness. No thyroid nodules palpated  Heart: RRR no murmurs, rubs, or gallops  Ext: no edema. peripheral pulses intacts no tremors bilaterally  DTR's: 2/4 B/L KJ, triceps, Achilles      Results for orders placed in visit on 12/08/08   TSH, BLOOD   Component Value Range   . TSH 2.79  0.27 - 4.20 (ulU/mL)  FREE THYROXINE, BLOOD   Component Value Range   . FREE T4 1.41  0.93 - 1.70 (ng/dL)         A/P 43 year old female with  1. HYPOTHYROID (244.9) patient is doing well and is biochemically and clinically euthyroid on her current dosing of levothyroxine. She is taking 100 MCG this for 6 days a week and 112 MCG is on Saturday. I advised patient to continue current dose and we will recheck a thyroid labs in 4-6 months or if symptomatic.  levothyroxine (SYNTHROID) 100 MCG tablet, levothyroxine (SYNTHROID) 112 MCG tablet, TSH, BLOOD, FREE THYROXINE, BLOOD        All of the patients questions were answered and patient verbalized understanding and agreement of the above plan  The past medical history, surgical history, family history, and social history were reviewed and updated  I have reviewed recent medical encounters, lab data, imaging data, and procedures

## 2009-02-09 ENCOUNTER — Telehealth (INDEPENDENT_AMBULATORY_CARE_PROVIDER_SITE_OTHER): Payer: Self-pay | Admitting: Internal Medicine

## 2009-02-10 ENCOUNTER — Encounter (INDEPENDENT_AMBULATORY_CARE_PROVIDER_SITE_OTHER): Payer: Self-pay | Admitting: Internal Medicine

## 2009-02-10 ENCOUNTER — Ambulatory Visit (INDEPENDENT_AMBULATORY_CARE_PROVIDER_SITE_OTHER): Admitting: Internal Medicine

## 2009-02-10 VITALS — BP 106/72 | HR 80 | Temp 97.8°F | Wt 169.0 lb

## 2009-03-20 ENCOUNTER — Encounter (INDEPENDENT_AMBULATORY_CARE_PROVIDER_SITE_OTHER): Payer: Self-pay | Admitting: "Endocrinology

## 2009-03-21 ENCOUNTER — Encounter (INDEPENDENT_AMBULATORY_CARE_PROVIDER_SITE_OTHER): Payer: Self-pay | Admitting: Internal Medicine

## 2009-03-22 ENCOUNTER — Encounter (INDEPENDENT_AMBULATORY_CARE_PROVIDER_SITE_OTHER): Payer: Self-pay | Admitting: Internal Medicine

## 2009-03-22 ENCOUNTER — Encounter (INDEPENDENT_AMBULATORY_CARE_PROVIDER_SITE_OTHER): Payer: Self-pay | Admitting: "Endocrinology

## 2009-03-22 ENCOUNTER — Telehealth (INDEPENDENT_AMBULATORY_CARE_PROVIDER_SITE_OTHER): Payer: Self-pay | Admitting: "Endocrinology

## 2009-03-22 ENCOUNTER — Other Ambulatory Visit (INDEPENDENT_AMBULATORY_CARE_PROVIDER_SITE_OTHER)

## 2009-03-22 ENCOUNTER — Other Ambulatory Visit (INDEPENDENT_AMBULATORY_CARE_PROVIDER_SITE_OTHER): Payer: Self-pay | Admitting: "Endocrinology

## 2009-03-22 LAB — FREE THYROXINE, BLOOD: Free T4: 1.29 ng/dL (ref 0.93–1.70)

## 2009-03-22 LAB — TSH, BLOOD: TSH: 8.87 u[IU]/mL — ABNORMAL HIGH (ref 0.27–4.20)

## 2009-03-23 ENCOUNTER — Encounter (INDEPENDENT_AMBULATORY_CARE_PROVIDER_SITE_OTHER): Payer: Self-pay | Admitting: Internal Medicine

## 2009-03-23 ENCOUNTER — Ambulatory Visit (INDEPENDENT_AMBULATORY_CARE_PROVIDER_SITE_OTHER): Admitting: Internal Medicine

## 2009-03-28 ENCOUNTER — Ambulatory Visit (INDEPENDENT_AMBULATORY_CARE_PROVIDER_SITE_OTHER): Admitting: Obstetrics & Gynecology

## 2009-03-28 VITALS — BP 98/71 | HR 78 | Temp 97.2°F | Ht 70.0 in | Wt 170.0 lb

## 2009-03-28 MED ORDER — NORETHIN ACE-ETH ESTRAD-FE 1-20 MG-MCG OR TABS
1.0000 | ORAL_TABLET | Freq: Every day | ORAL | Status: DC
Start: 2009-03-28 — End: 2010-05-03

## 2009-03-30 ENCOUNTER — Encounter (INDEPENDENT_AMBULATORY_CARE_PROVIDER_SITE_OTHER): Payer: Self-pay | Admitting: "Endocrinology

## 2009-03-31 NOTE — Telephone Encounter (Signed)
Dr. [facility] please advise.Tks

## 2009-04-07 ENCOUNTER — Encounter (INDEPENDENT_AMBULATORY_CARE_PROVIDER_SITE_OTHER): Payer: Self-pay | Admitting: Obstetrics & Gynecology

## 2009-04-11 ENCOUNTER — Telehealth (INDEPENDENT_AMBULATORY_CARE_PROVIDER_SITE_OTHER): Payer: Self-pay | Admitting: Internal Medicine

## 2009-04-18 ENCOUNTER — Ambulatory Visit (INDEPENDENT_AMBULATORY_CARE_PROVIDER_SITE_OTHER)

## 2009-04-27 ENCOUNTER — Institutional Professional Consult (permissible substitution) (INDEPENDENT_AMBULATORY_CARE_PROVIDER_SITE_OTHER)

## 2009-05-01 ENCOUNTER — Ambulatory Visit (INDEPENDENT_AMBULATORY_CARE_PROVIDER_SITE_OTHER): Admitting: Obstetrics & Gynecology

## 2009-05-01 VITALS — BP 114/75 | HR 85 | Temp 97.7°F | Ht 70.0 in | Wt 166.0 lb

## 2009-05-02 ENCOUNTER — Other Ambulatory Visit (INDEPENDENT_AMBULATORY_CARE_PROVIDER_SITE_OTHER): Payer: Self-pay | Admitting: "Endocrinology

## 2009-05-02 ENCOUNTER — Other Ambulatory Visit (INDEPENDENT_AMBULATORY_CARE_PROVIDER_SITE_OTHER)

## 2009-05-02 LAB — FREE THYROXINE, BLOOD: Free T4: 1.3 ng/dL (ref 0.93–1.70)

## 2009-05-02 LAB — TSH, BLOOD: TSH: 15.43 u[IU]/mL — ABNORMAL HIGH (ref 0.27–4.20)

## 2009-05-03 ENCOUNTER — Encounter (INDEPENDENT_AMBULATORY_CARE_PROVIDER_SITE_OTHER): Payer: Self-pay | Admitting: "Endocrinology

## 2009-05-03 ENCOUNTER — Ambulatory Visit (INDEPENDENT_AMBULATORY_CARE_PROVIDER_SITE_OTHER): Admitting: Optometrist

## 2009-05-07 LAB — DERM PATH TISSUE EXAM

## 2009-05-10 ENCOUNTER — Encounter (INDEPENDENT_AMBULATORY_CARE_PROVIDER_SITE_OTHER)

## 2009-05-17 ENCOUNTER — Ambulatory Visit (INDEPENDENT_AMBULATORY_CARE_PROVIDER_SITE_OTHER): Admitting: "Endocrinology

## 2009-05-17 ENCOUNTER — Ambulatory Visit (INDEPENDENT_AMBULATORY_CARE_PROVIDER_SITE_OTHER)

## 2009-05-17 VITALS — BP 112/72 | HR 70 | Temp 98.0°F | Resp 15 | Wt 169.0 lb

## 2009-05-17 MED ORDER — LEVOTHYROXINE SODIUM 112 MCG OR TABS
112.0000 ug | ORAL_TABLET | Freq: Every day | ORAL | Status: DC
Start: 2009-05-17 — End: 2010-02-01

## 2009-05-30 ENCOUNTER — Encounter (INDEPENDENT_AMBULATORY_CARE_PROVIDER_SITE_OTHER): Payer: Self-pay

## 2009-06-14 ENCOUNTER — Other Ambulatory Visit (INDEPENDENT_AMBULATORY_CARE_PROVIDER_SITE_OTHER)

## 2009-06-14 LAB — FREE THYROXINE, BLOOD: Free T4: 1.25 ng/dL (ref 0.93–1.70)

## 2009-06-14 LAB — TSH, BLOOD: TSH: 13.01 u[IU]/mL — ABNORMAL HIGH (ref 0.27–4.20)

## 2009-07-25 ENCOUNTER — Ambulatory Visit (INDEPENDENT_AMBULATORY_CARE_PROVIDER_SITE_OTHER): Admitting: Internal Medicine

## 2009-07-25 LAB — TSH, BLOOD: TSH: 8.86 u[IU]/mL — ABNORMAL HIGH (ref 0.27–4.20)

## 2009-07-25 LAB — FREE THYROXINE, BLOOD: Free T4: 1.4 ng/dL (ref 0.93–1.70)

## 2009-09-14 ENCOUNTER — Encounter (INDEPENDENT_AMBULATORY_CARE_PROVIDER_SITE_OTHER): Payer: Self-pay | Admitting: Internal Medicine

## 2009-09-19 ENCOUNTER — Telehealth (INDEPENDENT_AMBULATORY_CARE_PROVIDER_SITE_OTHER): Payer: Self-pay | Admitting: "Endocrinology

## 2009-09-20 LAB — FREE THYROXINE, BLOOD: Free T4: 1.33 ng/dL (ref 0.93–1.70)

## 2009-09-20 LAB — TSH, BLOOD: TSH: 8.13 u[IU]/mL — ABNORMAL HIGH (ref 0.27–4.20)

## 2009-11-21 ENCOUNTER — Ambulatory Visit (INDEPENDENT_AMBULATORY_CARE_PROVIDER_SITE_OTHER)

## 2009-11-21 ENCOUNTER — Ambulatory Visit (INDEPENDENT_AMBULATORY_CARE_PROVIDER_SITE_OTHER): Admitting: Otolaryngology

## 2009-11-21 NOTE — Procedures (Signed)
 SUBJECTIVE:   Brittany [name] is a 44 year old female. She is here today for audiogram and found to have cerumen requested ear cleaning. Patient denies  pain and drainage. In the past the patient has required ear cleaning. Patient's surgical history on their ears is negative. Family history for ear disorders: negative.  History of water exposure      OBJECTIVE:  Physical exam revealed bilateral cerumen right worse than left which was cleaned under otomicroscope. Left ear intact; right ear intact to visualization.  Exostosis present occluding about 50%.    ASSESSMENT:  Encounter Diagnoses   Code Name Primary?   . 380.4 Impacted cerumen Yes         PLAN:   Follow up: PRN/1 year for cleaning  Return to [facility] for completion of audiogram    [employer]

## 2009-11-21 NOTE — Progress Notes (Signed)
 See procedure note.

## 2009-11-24 ENCOUNTER — Telehealth (INDEPENDENT_AMBULATORY_CARE_PROVIDER_SITE_OTHER): Payer: Self-pay | Admitting: Internal Medicine

## 2009-11-24 ENCOUNTER — Encounter (INDEPENDENT_AMBULATORY_CARE_PROVIDER_SITE_OTHER): Payer: Self-pay | Admitting: Audiology

## 2009-11-24 NOTE — Telephone Encounter (Signed)
 Patient recently had audiogram done. Patient recommended for hearing aids.   Please place DME order.

## 2009-11-24 NOTE — Telephone Encounter (Signed)
DME order has been signed

## 2009-11-24 NOTE — Telephone Encounter (Signed)
 Dr Pattricia Boss, please sign  DME referral  if okay. Thanks.

## 2009-11-27 NOTE — Telephone Encounter (Signed)
 Routed to [place] for Auth. Thanks.

## 2009-11-28 NOTE — Telephone Encounter (Signed)
 Approved and faxed to [facility].     [facility]  [id number]   [place], [place] [address]  [phone]      Please close this encounter.

## 2009-12-22 ENCOUNTER — Ambulatory Visit (INDEPENDENT_AMBULATORY_CARE_PROVIDER_SITE_OTHER): Admitting: Internal Medicine

## 2009-12-22 LAB — TOTAL T3, BLOOD: T3 Total: 0.8 ng/mL (ref 0.8–2.0)

## 2009-12-22 LAB — FREE THYROXINE, BLOOD: Free T4: 1.31 ng/dL (ref 0.93–1.70)

## 2009-12-22 LAB — THYROXINE(T4), BLOOD: Thyroxine (T4): 8.9 ug/dL (ref 4.5–10.9)

## 2009-12-22 LAB — TSH, BLOOD: TSH: 17.97 u[IU]/mL — ABNORMAL HIGH (ref 0.27–4.20)

## 2009-12-22 MED ORDER — LEVOTHYROXINE SODIUM 100 MCG OR TABS
100.00 ug | ORAL_TABLET | ORAL | Status: DC
Start: ? — End: 2010-02-01

## 2009-12-22 NOTE — Progress Notes (Signed)
 The patient returns for a complete evaluation of her hypothyroidism, insomnia, GERD, and depression.  She states that she has had some periods of decreased energy, but also notes some both anxiety and depression based on her social situation in which he may be losing her job at [employer]. She therefore has a great deal of stress recently and has had some difficulty with insomnia. She's not interested in treating this medically at the present time, but will consider counseling. She denies any reflux symptoms.    Past medical history:  Past Medical History   Diagnosis Date   . Infectious mononucleosis 08/2006         Current outpatient prescriptions   Medication Sig   . levothyroxine (SYNTHROID) 100 MCG tablet Take 100 mcg by mouth every 48 hours. Alternating with 112 mcg every other day   . levothyroxine (SYNTHROID) 112 MCG tablet Take 1 Tab by mouth daily. Take 112 mcg every other day   . norethindrone-ethinyl estradiol (MICROGESTIN FE 1/20) 1-20 MG-MCG per tablet Take 1 Tab by mouth daily.   Marland Kitchen zolpidem (AMBIEN) 10 MG tablet Take 1 Tab by mouth nightly as needed for Insomnia.   Marland Kitchen ibuprofen (MOTRIN) 800 MG tablet Take 1 Tab by mouth every 8 hours as needed for Mild Pain.   . cetirizine (ZYRTEC) 10 MG tablet Take 10 mg by mouth daily.   Marland Kitchen levothyroxine (SYNTHROID) 100 MCG tablet Take 1 Tab by mouth daily.         No Known Allergies    Family history:  Family History   Problem Relation Age of Onset   . Ovarian Cancer Neg Hx      but sister w/ovarian cysts age 19   . Breast Cancer Mother    . Other Sister      Uterine fibroids/hysterectomy         Social history:  History   Substance Use Topics   . Smoking status: Never Smoker    . Smokeless tobacco: Not on file   . Alcohol Use: Yes      1 bottle wine /week         Review of systems:     The patient denies changes in visual acuity, diplopia or amaurosis, no discharge, matting, redness, tearing or eye pain.    She denies a history of hearing loss, ear pain, tinnitus  or aural discharge.    Patient denies sore throat, dental pain, hoarseness, dysphagia, oral or tongue lesions.    No pain or swelling in the neck. No swollen glands. No pain with motion of the neck.    No symptoms of hypo or hyperthyroidism: no decreased or increased weight, no feeling cold/chilly or excessively warm, no diarrhea or constipation, no undue sweatiness, anxiety or palpitations.    The patient denies cough, chest pain, dyspnea, wheezing or hemoptysis.    Patient denies any exertional chest pain, dyspnea, palpitations, syncope, orthopnea, edema or paroxysmal nocturnal dyspnea.    The patient denies abdominal or flank pain, anorexia, nausea or vomiting, dysphagia, change in bowel habits or black or bloody stools or weight loss.    The patient denies swelling, numbness, tingling or weakness in the extremities.    She denies symptoms of joint pain, swelling, myalgias or back pain.    She has noted no swollen glands in neck, axillary or inguinal areas, no fevers, weight loss, night sweats or pruritus. There is no family history of lymphoreticular neoplasia.    The patient denies dysuria, frequency  or hematuria.    The patient denies any symptoms of neurological impairment or TIAs; no amaurosis, diplopia, dysphasia, or unilateral disturbance of motor or sensory function. No loss of balance or vertigo.      PE--    She appears well, in no apparent distress.  Alert and oriented times three, pleasant and cooperative. Vital signs are as noted by the nurse.    Filed Vitals:    12/22/09 1508   BP: 100/70   Pulse: 72   Temp: 98.4 F (36.9 C)   TempSrc: Oral   Resp: 16   Height: 5' 9.25" (1.759 m)   Weight: 78.019 kg (172 lb)         Eye exam is normal - PERLA, EOMI, fundi normal, corneas normal, no foreign bodies.    Ear exam - both sides normal, TM intact without perforation or effusion, external canal normal. No significant ceruminosis noted.    Throat exam normal. Oral cavity, tongue, pharynx and palate have no  inflammation or suspicious lesions. Tonsils - tonsils are present and normal. Teeth normal without tenderness.    The neck is supple and free of adenopathy or masses, the thyroid is normal without enlargement or nodules.    Thyroid not palpable, not enlarged, no nodules detected.    Chest is clear, no wheezing or rales. Normal symmetric air entry throughout both lung fields. No chest wall deformities or tenderness.    S1 and S2 normal, no murmurs, clicks, gallops or rubs. Regular rate and rhythm. Chest is clear; no wheezes or rales. No edema or JVD.    The abdomen is soft without tenderness, guarding, mass, rebound or organomegaly. Bowel sounds are normal. No CVA tenderness or inguinal adenopathy noted.    Extremities: extremities, peripheral pulses and reflexes normal, no edema, redness or tenderness in the calves or thighs, feet normal, good pulses, normal color, temperature and sensation.    A general joint exam is normal with full range of motion of spine, shoulders, elbows, wrists, fingers, hips, knees and ankles; no active swelling, tenderness or synovitis at any joint. No soft tissue nodules.    Cranial nerves are normal. Fundi are normal with sharp disc margins, no papilledema, hemorrhages or exudates noted. PERLA. EOMs intact. Neck supple. No cranial or carotid bruits.  Good carotid upstroke.  DTRs, motor power and sensation normal and symmetric. Babinski sign absent.  Mental status normal.  Gait and station normal.  Cerebellar function is normal.    Results for orders placed in visit on 09/19/09   TSH, BLOOD   Component Value Range   . TSH 8.13 (*) 0.27 - 4.20 (uIU/mL)   FREE THYROXINE, BLOOD   Component Value Range   . Free T4 1.33  0.93 - 1.70 (ng/dL)           A/P--    1. Acquired hypothyroidism (244.9)--patient is apparently likely hypothyroid, she is very sensitive to adjustments in her levothyroxine dose. Will therefore have her call Dr. Sigurd Sos for an appointment in order to adjust her medication.  I have ordered repeat thyroid function testing today.  TSH, BLOOD, THYROXINE(T4), BLOOD, TOTAL T3, BLOOD, FREE THYROXINE, BLOOD   2. Insomnia (780.52)--likely secondary to her depression. The patient agrees to seek counseling by calling her insurance for her referral.    3. Depression (311)--she is not interested in medical therapy and therefore will seek counseling as above.     4. GERD (gastroesophageal reflux disease) (530.81)--continue current treatment.

## 2009-12-22 NOTE — Progress Notes (Signed)
 AVS instructions given to patient  post clinic visit with good understanding.

## 2009-12-25 ENCOUNTER — Telehealth (INDEPENDENT_AMBULATORY_CARE_PROVIDER_SITE_OTHER): Payer: Self-pay | Admitting: "Endocrinology

## 2009-12-25 NOTE — Telephone Encounter (Signed)
 routing to Dr. Sigurd Sos for schedule availability issue  Please when will it be okay to [facility], thanks!

## 2009-12-25 NOTE — Telephone Encounter (Signed)
 Pt calling to advise that she will be losing her insurance at the end of the month, asking if it is possible to be worked in this month for endo/thyroid f/u?

## 2009-12-28 NOTE — Telephone Encounter (Signed)
 Please confirm current dosing regimin of patient. I will change her dose.  I have some questions for her    1. When does she take her medicine?    2. When does she eat in relation to her medicine    3. Does she take calcium or iron pills within four hours of her medicine    4. What is her current dosing regimen.    Tks. Based on above answers I will change her dose. tks

## 2009-12-28 NOTE — Telephone Encounter (Signed)
 Pt called checking on status of message below. Pt states if she is note able to get into see Dr/ Hofflich this month then she would like to know if she can get her medication increased. Please call and advise.

## 2009-12-28 NOTE — Telephone Encounter (Signed)
 Will re-route to Dr. Sigurd Sos for results review  Please advise, also patient would like to know if 1) can be seen sooner than 02/01/2010 and 2) meds can be adjusted.    Results for Brittany [name], Brittany [name] (MRN [id number]) as of 12/28/2009 09:42   Ref. Range 12/22/2009 15:46   T3 Total Latest Range: 0.8-2.0 ng/mL 0.8   Thyroxine (T4) Latest Range: 4.5-10.9 mcg/dL 8.9   Free T4 Latest Range: 0.93-1.70 ng/dL 5.40   TSH Latest Range: 0.27-4.20 uIU/mL 17.97 (H)

## 2010-01-02 ENCOUNTER — Encounter (INDEPENDENT_AMBULATORY_CARE_PROVIDER_SITE_OTHER): Payer: Self-pay | Admitting: "Endocrinology

## 2010-01-02 NOTE — Telephone Encounter (Signed)
 From: [name]   To: [name], DO   Sent: Tue Jan 02, 2010 9:10 AM   Subject: 2-Procedural Question    Dear Dr. Sigurd Sos,    I have an appointment with you on September 15, but am writing to request a change in my levothyroxine dose before then, if possible. I saw Dr. Pattricia Boss for a physical on Aug. 5 and had my thyroid levels tested. My TSH is up to 17.97 and I feel tired all the time. I've been taking 100 mcg and 112 mcg on alternating days since the last time I saw you. My weight is 172 pounds.     Dr. Pattricia Boss recommended taking a higher dose, but deferred to you.     Thank you.    Brittany [name]

## 2010-01-02 NOTE — Telephone Encounter (Signed)
 Routing to Dr. Sigurd Sos  Please advise, thanks!

## 2010-01-18 ENCOUNTER — Ambulatory Visit (INDEPENDENT_AMBULATORY_CARE_PROVIDER_SITE_OTHER): Payer: Self-pay | Admitting: Internal Medicine

## 2010-01-27 ENCOUNTER — Encounter (INDEPENDENT_AMBULATORY_CARE_PROVIDER_SITE_OTHER): Payer: Self-pay | Admitting: "Endocrinology

## 2010-01-28 NOTE — Telephone Encounter (Signed)
 From: [name]   To: [name], DO   Sent: Sat Jan 27, 2010 10:42 AM   Subject: 2-Procedural Question    Dear Dr. Sigurd Sos,    I started taking of levothyroxine as you suggested. I had to renew my prescription, though, and the manufacturer of the generic changed from [employer] to [employer]. I got my period again after only 2 weeks and have now been bleeding for 12 days. I started my regular, heavier cycle yesterday (9/9). I've also had stomach problems and have had diarrhea since yesterday afternoon. I'm wondering if other patients have had problems with the [place] medication. I have an appt with you this Thursday, 9/15.    Thanks,  Brittany [name]    ----- Message -----  To: [name]  From: [name], DO  Received: 01/02/2010 3:10 PM    Pls take 112 mcg daily and I will see you in September. tks    ----- Message -----   From: [name]   Sent: 01/02/10 09:10 AM   To: [name], DO  Subject: 2-Procedural Question    Dear Dr. Sigurd Sos,    I have an appointment with you on September 15, but am writing to request a change in my levothyroxine dose before then, if possible. I saw Dr. Pattricia Boss for a physical on Aug. 5 and had my thyroid levels tested. My TSH is up to 17.97 and I feel tired all the time. I've been taking 100 mcg and 112 mcg on alternating days since the last time I saw you. My weight is 172 pounds.     Dr. Pattricia Boss recommended taking a higher dose, but deferred to you.     Thank you.    Brittany [name]

## 2010-02-01 ENCOUNTER — Ambulatory Visit (INDEPENDENT_AMBULATORY_CARE_PROVIDER_SITE_OTHER): Admitting: "Endocrinology

## 2010-02-01 ENCOUNTER — Encounter (INDEPENDENT_AMBULATORY_CARE_PROVIDER_SITE_OTHER): Payer: Self-pay | Admitting: "Endocrinology

## 2010-02-01 VITALS — BP 120/82 | HR 80 | Temp 98.2°F | Resp 16 | Ht 69.25 in | Wt 170.0 lb

## 2010-02-01 MED ORDER — LEVOTHYROXINE SODIUM 112 MCG OR TABS
112.0000 ug | ORAL_TABLET | Freq: Every day | ORAL | Status: DC
Start: 2010-02-01 — End: 2010-12-05

## 2010-02-01 NOTE — Progress Notes (Signed)
No chief complaint on file.      Pt is a 44 year old female here for followup     Before labs on 8/11, we were alternating synthorid 100/112. Pt then had TSH of 17 and was tired and not feeling bad. NO hair loss, no confusion.  Pt was switched from generic mylan to a different brand.   Patient takes her thyroid medication upon waking, 30 minutes before food intake. Not with any calcium or iron supplements.  Patient takes same brand of thyroid medication. No recent changes.   Patient on synthroid 112 mcg daily.  Now feeling better. No tremors, or hot flashes.   Paitent staes stomach issues. Menstrual cycle for two weeks and they have gotten shorter.       Past Medical History   Diagnosis Date   . Infectious mononucleosis 08/2006         Past Surgical History   Procedure Date   . No past surgical history          Family History   Problem Relation Age of Onset   . Ovarian Cancer Neg Hx      but sister w/ovarian cysts age 3   . Breast Cancer Mother    . Other Sister      Uterine fibroids/hysterectomy       History   Social History   . Marital Status: Married     Spouse Name: N/A     Number of Children: N/A   . Years of Education: N/A   Occupational History   . Not on file.   Social History Main Topics   . Smoking status: Never Smoker    . Smokeless tobacco: Not on file   . Alcohol Use: Yes      1 bottle wine /week   . Drug Use: No   . Sexually Active: Not Currently   Other Topics Concern   . Not on file   Social History Narrative   . No narrative on file       Current outpatient prescriptions   Medication Sig Dispense Refill   . levothyroxine (SYNTHROID) 112 MCG tablet Take 1 Tab by mouth daily. Take 112 mcg every other day  30 Tab  6   . norethindrone-ethinyl estradiol (MICROGESTIN FE 1/20) 1-20 MG-MCG per tablet Take 1 Tab by mouth daily.  84 Tab  4   . zolpidem (AMBIEN) 10 MG tablet Take 1 Tab by mouth nightly as needed for Insomnia.  30 Tab  2   . ibuprofen (MOTRIN) 800 MG tablet Take 1 Tab by mouth every 8 hours as  needed for Mild Pain.  60 Tab  1   . cetirizine (ZYRTEC) 10 MG tablet Take 10 mg by mouth daily.       Marland Kitchen levothyroxine (SYNTHROID) 100 MCG tablet Take 100 mcg by mouth every 48 hours. Alternating with 112 mcg every other day       . levothyroxine (SYNTHROID) 100 MCG tablet Take 1 Tab by mouth daily.  90 Tab  3       Review of patient's allergies indicates no known allergies.      Review of Systems -   Constitutional: + fatigue, no weight loss, fever, night sweats.  HEENT: none   CV: no chest pain, heart palpitations  Resp: negative for cough, shortness of breath, wheezing  GI: negative for vomiting, occ abdominal pain, no diarrhea  GU: No dysuria, urgency, frequency, or nocturia  Musculoskeletal:none  Neuro: No dizziness,  syncope,  derm: no unusual rashes, moles, pigmentation changes  Psych: none. No suicidal thoughts or ideations    Blood pressure 120/82, pulse 80, temperature 98.2 F (36.8 C), temperature source Oral, resp. rate 16, height 5' 9.25" (1.759 m), weight 77.111 kg (170 lb).  Gen: AAOx3 NAD  Neck: negative adenopathy. thyroid nonpalpable  Heart: RRR no murmurs, rubs, or gallops  Lungs: CTA B/L no wheezes, rales, or ronchi  Ext: no edema. peripheral pulses intact  DTR's: 2/4 B/L KJ, triceps, Achilles no tremors B/L      Results for orders placed in visit on 12/22/09   TSH, BLOOD   Component Value Range   . TSH 17.97 (*) 0.27 - 4.20 (uIU/mL)   THYROXINE(T4), BLOOD   Component Value Range   . Thyroxine (T4) 8.9  4.5 - 10.9 (mcg/dL)   TOTAL T3, BLOOD   Component Value Range   . T3 Total 0.8  0.8 - 2.0 (ng/mL)   FREE THYROXINE, BLOOD   Component Value Range   . Free T4 1.31  0.93 - 1.70 (ng/dL)         A/P 44 year old female with fluctuating levels of thyroid hormone. Recent brand changes of generic    Plan:    Patient has been on 112 of synthorid since 8/25 and is feeling better.  We will now switch to 112 mcg of synthroid once daily BRAND NAME ONLY from now on. This way, we can figure out why patient has  such fluctuating levels, perhaps related to the 20 % bioavailability difference in thyroid hormone.  We will check labs in 2 weeks, that way pt has been on the new dose for a total of 1 month-6 weeks.  We will make adjustments as needed.     All of the patients questions were answered and patient verbalized understanding and agreement of the above plan  The past medical history, surgical history, family history, and social history were reviewed and updated  I have reviewed recent medical encounters, lab data, imaging data, and procedures

## 2010-02-07 ENCOUNTER — Ambulatory Visit (INDEPENDENT_AMBULATORY_CARE_PROVIDER_SITE_OTHER)

## 2010-02-07 NOTE — Progress Notes (Signed)
Brittany [name] is a 44 year old female here for flu clinic.    Following standard protocol, the patient has been screened and determined to be appropriate for vaccine based on review of the "[facility] Influenza Vaccination Questionnaire."    Patient was handed the CDC VIS for Inactivated Influenza Vaccine (version 12/12/09).

## 2010-02-12 ENCOUNTER — Other Ambulatory Visit (INDEPENDENT_AMBULATORY_CARE_PROVIDER_SITE_OTHER)

## 2010-02-12 LAB — TSH, BLOOD: TSH: 4.02 u[IU]/mL (ref 0.27–4.20)

## 2010-02-12 LAB — FREE THYROXINE, BLOOD: Free T4: 1.75 ng/dL — ABNORMAL HIGH (ref 0.93–1.70)

## 2010-02-25 NOTE — Lab (Signed)
 SPECIMENS SUBMITTED:  Cervical (Pap) Smear     CLINICAL INFORMATION:  LMP: 01/17/2007  FINAL CYTOLOGIC INTERPRETATION:  Atypical squamous cells - of undetermined significance     COMMENT:  HPV Subtyping will be performed on excess Sure-Path collection fluid from  the specimen vial. Result will be reported in [facility] separately.     SPECIMEN ADEQUACY:  Satisfactory for evaluation  The Pap smear is a screening test with an inherent low error rate. Although  a normal [negative] result is highly predictive of the absence of cervical  cancer and its precursor lesions, it does not exclude the presence of  significant disease. Results must be evaluated within the context of the  individual patient.  CONFIDENTIAL HEALTH INFORMATION: Health Care information is personal and  sensitive information. If it is being faxed to you it is done so under  appropriate authorization from the patient or under circumstances that do  not require patient authorization. You, the recipient, are obligated to  maintain it in a safe, secure and confidential manner. Re-disclosure  without additional patient consent or as permitted by law is prohibited.  Unauthorized re-disclosure or failure to maintain confidentiality could  subject you to penalties described in federal and state law. If you have  received this report or facsimile in error, please notify the [place]  [facility] immediately and destroy the received document(s).   Material reviewed and Interpreted and  Report Electronically Signed by:  Netta Bar M.D. [phone])  Attending Surgical Pathologist  02/12/07 08:38  Electronic Signature derived from a single  controlled access password

## 2010-02-25 NOTE — Lab (Signed)
Dermatopathologist: Clenton Pare. Maren Reamer, M.D.      Report Date: 10/16/2005        Dermatopathology Laboratory  Division of Dermatology  CLIA# [id number]  Director of Clinical Labs: Blair Heys, MD, PhD    BIOPSY DATE: 10/04/2005    ACC[facility]BER: [id number]    REQUESTING PHYSICIAN: Eliezer Lofts, M.D.    DATE RECEIVED: 10/07/2005    01275A/SITE/CLINICAL DIAGNOSIS: Nasal ala, shave/adnexal tumor versus  intradermal nevus versus basal cell carcinoma.    CLINICAL HISTORY: A 2 mm, pearly pink papule.    GROSS DESCRIPTION: The specimen consists of a shave biopsy of skin  measuring 0.2 x 0.2 x 0.1 cm. The specimen was entirely submitted in one  cassette.    MICROSCOPIC DESCRIPTION: The specimen contains a pigmented cellular tumor  composed of nevomelanocytes dispersed throughout the dermis. No evidence  of cytologic atypia is identified in the sections studied.    PATHOLOGIC DIAGNOSIS: Skin, nasal ala, shave: Intradermal melanocytic  nevus.    01275B/SITE/CLINICAL DIAGNOSIS: Mid pubic, shave/recurrent nevus versus  atypical nevus.    CLINICAL HISTORY: Midpubic, 7 mm irregular brown macule, was biopsied 10  years ago and recurred recently.    GROSS DESCRIPTION: The specimen consists of a shave biopsy of skin  measuring 0.8 x 0.7 x 0.1 cm. The skin surface contained a brown macule  measuring 0.6 x 0.5 cm. The specimen was entirely submitted in one  cassette.    MICROSCOPIC DESCRIPTION: The specimen contains a pigmented cellular tumor  composed of both single and nested nevomelanocytes distributed at the  dermoepidermal junction. There is proliferation of single cells in  confluent collections along the base of the epidermis above an area of  fibrotic change with bands of collagen arranged horizontally, parallel to  the overlying epithelium. Also noted is a patchy lymphocytic infiltrate.  Beyond the scar tissue are collections of nests of cytologically  benign-appearing  melanocytic nevus cells at the dermoepidermal junction.  No dermal melanocytic proliferation and no upward pagetoid spread of single  or nested melanocytes was noted either on the original sections or on  deeper levels. The junctional cells displayed mild cytologic atypia.    PATHOLOGIC DIAGNOSIS: Skin, midpubic, shave: Junctional melanocytic nevus  with associated architectural disorder and fibrosis consistent with  recurrent atypical nevus (see comments).    COMMENTS: The specimen contains histologic features seen in nevi  previously named "dysplastic." These findings correlate with clinically  atypical nevi. The diagnosis of familial atypical mole-malignant melanoma  syndrome (FAMM) is made in the clinical setting of numerous such nevi and a  history of melanoma in first-degree relatives. Clinical correlation is  suggested.    01275C/SITE/CLINICAL DIAGNOSIS: Right midback, shave/atypical nevus.    CLINICAL HISTORY: A 2 mm, dark black-pink papule on right midback.    GROSS DESCRIPTION: The specimen consists of a shave biopsy of skin  measuring 0.4 x 0.3 x 0.1 cm. The skin surface contained a dark brown  macule measuring 0.3 x 0.1 cm. The specimen was entirely submitted in one  cassette.    MICROSCOPIC DESCRIPTION: The central portion of the specimen contained a  pigmented cellular tumor composed of cytologically benign-appearing  melanocytic nevus cells distributed in nests and singly at the  dermoepidermal junction as well as in the underlying dermis. There is  proliferation of single cells along the sides of extended rete ridges and  focally, fusion of nests between adjacent rete. No upward pagetoid spread  of single or nested melanocytes was noted in the sections studied. The  dermis contained a sparse lymphocytic infiltrate admixed with occasional  melanophages. The pigmented lesion did not appear to extend to the margins  in the plane of the sections examined.    PATHOLOGIC DIAGNOSIS: Skin, right midback,  shave: Compound melanocytic  nevus with associated architectural disorder (see comments).    COMMENTS: As for specimen B, the lesion contains histologic features of  nevi previously termed "dysplastic."    01275D/SITE/CLINICAL DIAGNOSIS: Left buttock, shave/atypical nevus.    CLINICAL HISTORY: A 6 x 5 mm, dark, irregular brown macule.    GROSS DESCRIPTION: The specimen consists of a shave biopsy of skin  measuring 0.8 x 0.7 x 0.1 cm. The skin surface contained a brown macule  measuring 0.7 x 0.6 cm. The specimen was entirely submitted in one  cassette.    MICROSCOPIC DESCRIPTION: The specimen contains a pigmented cellular tumor  composed of cytologically benign-appearing melanocytic nevus cells  distributed in nests at the dermoepidermal junction as well as focally in  the underlying superficial dermis. There is proliferation of junctional  nests both laterally and longitudinally to those in the dermis. There is  also proliferation of single cells along the sides of extended rete ridges.  No upward pagetoid spread of single or nested melanocytes was noted in the  sections examined.    PATHOLOGIC DIAGNOSIS: Skin, left buttock, shave: Compound melanocytic  nevus with associated architectural disorder (see comments).    COMMENTS: As for specimens B and C, the lesion contains histologic  features of nevi previously termed "dysplastic."            Job Number [id number] dh            Signature Derived From Controlled Access Password  Seaver Machia C. Maren Reamer, M.D. 10/17/2005 14:29          DD: 10/16/2005 DT: 10/17/2005 9:57 A DocNo.: [id number]  [id number]    cc: Francee Piccolo, MD   [employer]   [facility]   [place] [address]

## 2010-02-25 NOTE — Lab (Signed)
SPECIMENS SUBMITTED:  Cervical (Pap) Smear     CLINICAL INFORMATION:  LMP: 03/11/2008 Other Clinical Findings: 44 yo return annual Gyn  FINAL CYTOLOGIC INTERPRETATION:  No Atypical or Malignant Cells     COMMENT:  Previous cytology reviewed     SPECIMEN ADEQUACY:  Satisfactory for evaluation  The Pap smear is a screening test with an inherent low error rate. Although  a normal [negative] result is highly predictive of the absence of cervical  cancer and its precursor lesions, it does not exclude the presence of  significant disease. Results must be evaluated within the context of the  individual patient.  CONFIDENTIAL HEALTH INFORMATION: Health Care information is personal and  sensitive information. If it is being faxed to you it is done so under  appropriate authorization from the patient or under circumstances that do  not require patient authorization. You, the recipient, are obligated to  maintain it in a safe, secure and confidential manner. Re-disclosure  without additional patient consent or as permitted by law is prohibited.  Unauthorized re-disclosure or failure to maintain confidentiality could  subject you to penalties described in federal and state law. If you have  received this report or facsimile in error, please notify the [facility]  Pathology Department immediately and destroy the received document(s).   Material reviewed and Interpreted and  Report Electronically Signed by:  Margorie John CT(ASCP)  Sr. Cytotechnologist  03/28/08 15:36  Electronic Signature derived from a single  controlled access password

## 2010-03-26 ENCOUNTER — Ambulatory Visit (INDEPENDENT_AMBULATORY_CARE_PROVIDER_SITE_OTHER): Admitting: Optometrist

## 2010-03-29 ENCOUNTER — Encounter (INDEPENDENT_AMBULATORY_CARE_PROVIDER_SITE_OTHER): Payer: Self-pay | Admitting: "Endocrinology

## 2010-03-29 DIAGNOSIS — E0500 Thyrotoxicosis with diffuse goiter without thyrotoxic crisis or storm: Secondary | ICD-10-CM

## 2010-03-29 NOTE — Telephone Encounter (Signed)
From: [name]   To: [name], DO   Sent: Thu Mar 29, 2010 1:58 PM   Subject: 2-Procedural Question    Dear Dr. Sigurd Sos,    I'd like to check my TSH and free T4 levels, if you would order the labs. My heart's been racing a lot and I get out of breath easily. Thank you.    Brittany [name]

## 2010-03-29 NOTE — Telephone Encounter (Signed)
Routing to Dr. Sigurd Sos for lab request approval/denial   Please advise, thanks!

## 2010-04-03 ENCOUNTER — Other Ambulatory Visit (INDEPENDENT_AMBULATORY_CARE_PROVIDER_SITE_OTHER)

## 2010-04-16 ENCOUNTER — Encounter (INDEPENDENT_AMBULATORY_CARE_PROVIDER_SITE_OTHER): Payer: Self-pay | Admitting: Internal Medicine

## 2010-04-16 NOTE — Telephone Encounter (Signed)
Routed to Dr Pattricia Boss.

## 2010-04-16 NOTE — Telephone Encounter (Signed)
From: [name]   To: Patric Dykes, MD   Sent: Mon Apr 16, 2010 12:39 PM   Subject: 2-Procedural Question    Dear Dr. Pattricia Boss,    I would like to request a referral to an allergist. I continually have cold-like symptoms (sinus headache, runny nose, body/joint aches) but cold medicine doesn't seem to help. Please let me know if I need to make an appointment to see you, or if you could provide a referral through the [facility] system. Thank you!    Brittany [name]

## 2010-04-17 ENCOUNTER — Encounter (INDEPENDENT_AMBULATORY_CARE_PROVIDER_SITE_OTHER): Payer: Self-pay | Admitting: Internal Medicine

## 2010-04-17 NOTE — Telephone Encounter (Signed)
From: [name]   To: Patric Dykes, MD   Sent: Tue Apr 17, 2010 12:09 PM   Subject: 2-Procedural Question    Thank you. I have an appointment with you on Friday December 9 at 11:00 AM. A little more background: a couple of years ago we tried Singulair, but there were side effects (don't remember exactly, but I think it made me more foggy than the allergies). I currently take OTC Zyrtec, but not very often. Can only take it at night as it makes me very drowsy. See you soon.    Marjean Donna    ----- Message -----   From: Patric Dykes, MD   Sent: 04/17/2010 7:17 AM   To: [name]  Subject: RE: 2-Procedural Question     I would like to see you first, since there are some things I can try before we have you see an allergist. Please make an appointment to see me.    ----- Message -----   From: [name]   Sent: 04/16/2010 12:39 PM PST   To: Patric Dykes, MD  Subject: 2-Procedural Question    Dear Dr. Pattricia Boss,    I would like to request a referral to an allergist. I continually have cold-like symptoms (sinus headache, runny nose, body/joint aches) but cold medicine doesn't seem to help. Please let me know if I need to make an appointment to see you, or if you could provide a referral through the [facility] system. Thank you!    [name]

## 2010-04-27 ENCOUNTER — Ambulatory Visit (INDEPENDENT_AMBULATORY_CARE_PROVIDER_SITE_OTHER): Admitting: Internal Medicine

## 2010-04-27 MED ORDER — FLUTICASONE PROPIONATE 50 MCG/ACT NA SUSP
1.0000 | Freq: Two times a day (BID) | NASAL | Status: DC
Start: 2010-04-27 — End: 2010-06-15

## 2010-04-27 NOTE — Progress Notes (Signed)
The patient returns for followup of her hypothyroidism, insomnia, GERD, and depression.  She states she has been doing well on her current dose of Synthroid with no symptoms of hyper or hypothyroidism. She also is sleeping better, but still has fatigue associated with persistent rhinorrhea. She has no other symptoms of URI. She notes that her reflux is under good control and she has no symptoms of depression at the present time.    Past Medical History   Diagnosis Date   . Infectious mononucleosis 08/2006       Current Outpatient Prescriptions   Medication Sig   . fluticasone propionate (FLONASE) 50 MCG/ACT nasal spray Spray 1 spray into each nostril 2 times daily.   Marland Kitchen levothyroxine (SYNTHROID) 112 MCG tablet Take 1 tablet by mouth daily. Brand name only   . norethindrone-ethinyl estradiol (MICROGESTIN FE 1/20) 1-20 MG-MCG per tablet Take 1 Tab by mouth daily.   Marland Kitchen ibuprofen (MOTRIN) 800 MG tablet Take 1 Tab by mouth every 8 hours as needed for Mild Pain.   . cetirizine (ZYRTEC) 10 MG tablet Take 10 mg by mouth daily.   Marland Kitchen zolpidem (AMBIEN) 10 MG tablet Take 1 Tab by mouth nightly as needed for Insomnia.       No Known Allergies    ROS--    No symptoms of hypo or hyperthyroidism: no decreased or increased weight, no feeling cold/chilly or excessively warm, no diarrhea or constipation, no undue sweatiness, anxiety or palpitations.    The patient denies cough, chest pain, dyspnea, wheezing or hemoptysis.    Patient denies any exertional chest pain, dyspnea, palpitations, syncope, orthopnea, edema or paroxysmal nocturnal dyspnea.    The patient denies abdominal or flank pain, anorexia, nausea or vomiting, dysphagia, change in bowel habits or black or bloody stools or weight loss.    The patient denies swelling, numbness, tingling or weakness in the extremities.    PE--    She appears well, in no apparent distress.  Alert and oriented times three, pleasant and cooperative. Vital signs are as noted by the nurse.    Filed  Vitals:    04/27/10 1113   BP: 122/72   Pulse: 68   Temp: 98.6 F (37 C)   TempSrc: Oral   Resp: 14   Height: 5\' 9"  (1.753 m)   Weight: 78.019 kg (172 lb)       Thyroid not palpable, not enlarged, no nodules detected.    Chest is clear, no wheezing or rales. Normal symmetric air entry throughout both lung fields. No chest wall deformities or tenderness.    S1 and S2 normal, no murmurs, clicks, gallops or rubs. Regular rate and rhythm. Chest is clear; no wheezes or rales. No edema or JVD.    The abdomen is soft without tenderness, guarding, mass, rebound or organomegaly. Bowel sounds are normal. No CVA tenderness or inguinal adenopathy noted.    Extremities: extremities, peripheral pulses and reflexes normal, no edema, redness or tenderness in the calves or thighs, feet normal, good pulses, normal color, temperature and sensation.    A/P--    1. Acquired hypothyroidism (244.9)--she will continue her current dose of levothyroxine and followup with Dr.Hofflich.     2. Allergic rhinitis (477.9)--will check RAST panel and start Flonase.  RAST PANEL INHALANT, BLOOD, fluticasone propionate (FLONASE) 50 MCG/ACT nasal spray   3. GERD (gastroesophageal reflux disease) (530.81)--continue current treatment.     4. Insomnia (780.52)--symptoms are improved.     5. Depression (311)--will follow.

## 2010-05-03 ENCOUNTER — Other Ambulatory Visit: Payer: Self-pay

## 2010-05-03 ENCOUNTER — Ambulatory Visit (INDEPENDENT_AMBULATORY_CARE_PROVIDER_SITE_OTHER): Admitting: Obstetrics & Gynecology

## 2010-05-03 VITALS — BP 111/66 | HR 77 | Temp 97.6°F | Ht 69.0 in | Wt 169.0 lb

## 2010-05-03 MED ORDER — NORETHIN ACE-ETH ESTRAD-FE 1-20 MG-MCG OR TABS
1.0000 | ORAL_TABLET | Freq: Every day | ORAL | Status: DC
Start: 2010-05-03 — End: 2011-06-20

## 2010-05-03 NOTE — Progress Notes (Signed)
Interval History  Brittany [name] is a 44 year old female who is here for   Chief Complaint   Patient presents with   . Gyn Exam   .    VITALS: BP 111/66  Pulse 77  Temp(Src) 97.6 F (36.4 C) (Oral)  Ht 5\' 9"  (1.753 m)  Wt 76.658 kg (169 lb)  BMI 24.96 kg/m2  LMP 04/18/2010    No Known Allergies    1.  OB/GYN HISTORY:   No data filed   Patient's last menstrual period was 04/18/2010.  Pt with history of menorrhagia with negative emb last year.  Has been on ocp for cycle control over past year with good results.  Normal flow with withdrawal bleeds.  No intermenstrual bleeding or abnormal discharge.  No pelvic or abdominal pain.  Last mmg 3.2010 and negative.  Last pap 11/10 and negative.  Remote history or abnormal pap, no history of cervical dysplasia.  Pt not sexually active currently.  Discussed reducing pap smear screening interval.  Will collect pap/hpv today.  If neg, no pap needed 3 years unless has new partner and any abnormal symptoms.     ROS:  GI:  negative  GU: negative.  I did review available medical, surgical, obstetrical and social history.    PHYSICAL EXAM:  Head: negative  Neck:  thyroid normal, no adenopathy  Breasts: no lymphadenopathy, no skin changes, no masses or discharge  Abdomen: abdomen soft, non-tender, BS normal, no masses or HSM  Pelvic: normal external female genitalia, BUS-normal appearing  Vulva/Vagina: normal  Cervix: normal in appearance, no lesions or masses  Uterus: normal sized  Adnexa: no adnexal masses or tenderness  Rectal Exam: not performed  Abnormal Findings: none    IMPRESSION/ PLAN: Normal gyn exam    HCM: PAP Smear and mammogram    Counseling: [place]++, Exercise and Monthly SBE         Patient barriers to learnng: none    Pt/Family understanding: verbalizes    Follow-Up: Return in 1 year for annual exam or  sooner if problems should occur.    Authored by: Rogelio Seen, MD

## 2010-05-08 NOTE — Procedures (Signed)
 SPECIMENS SUBMITTED:  Cervical (Pap) Smear;SurePath Vial Received     CLINICAL INFORMATION:  LMP: 04/18/2010 Other Clinical Findings: Screening  FINAL CYTOLOGIC INTERPRETATION:  No Atypical or Malignant Cells  Acute inflammation     COMMENT:  HPV Subtyping will be performed on excess Sure-Path collection fluid from  the specimen vial. Result will be reported in EPIC separately.     SPECIMEN ADEQUACY:  Satisfactory for evaluation  The Pap smear is a screening test with an inherent low error rate. Although  a normal [negative] result is highly predictive of the absence of cervical  cancer and its precursor lesions, it does not exclude the presence of  significant disease. Results must be evaluated within the context of the  individual patient.  CONFIDENTIAL HEALTH INFORMATION: Health Care information is personal and  sensitive information. If it is being faxed to you it is done so under  appropriate authorization from the patient or under circumstances that do  not require patient authorization. You, the recipient, are obligated to  maintain it in a safe, secure and confidential manner. Re-disclosure  without additional patient consent or as permitted by law is prohibited.  Unauthorized re-disclosure or failure to maintain confidentiality could  subject you to penalties described in federal and state law.  If you have  received this report or facsimile in error, please notify the [facility]  Pathology Department immediately and destroy the received document(s).   Material reviewed and Interpreted and  Report Electronically Signed by:  Ian Bushman CT(ASCP)  Sr. Cytotechnologist  05/08/10 15:42  Electronic Signature derived from a single  controlled access password

## 2010-05-10 ENCOUNTER — Ambulatory Visit (INDEPENDENT_AMBULATORY_CARE_PROVIDER_SITE_OTHER): Admitting: Internal Medicine

## 2010-05-10 ENCOUNTER — Telehealth (INDEPENDENT_AMBULATORY_CARE_PROVIDER_SITE_OTHER): Payer: Self-pay | Admitting: Internal Medicine

## 2010-05-10 ENCOUNTER — Encounter (INDEPENDENT_AMBULATORY_CARE_PROVIDER_SITE_OTHER): Payer: Self-pay | Admitting: Internal Medicine

## 2010-05-10 MED ORDER — CYCLOBENZAPRINE HCL 5 MG OR TABS
5.0000 mg | ORAL_TABLET | Freq: Three times a day (TID) | ORAL | Status: DC | PRN
Start: 2010-05-10 — End: 2013-09-21

## 2010-05-10 MED ORDER — IBUPROFEN 800 MG OR TABS
800.0000 mg | ORAL_TABLET | Freq: Three times a day (TID) | ORAL | Status: DC | PRN
Start: 2010-05-10 — End: 2010-05-10

## 2010-05-10 MED ORDER — IBUPROFEN 800 MG OR TABS
800.0000 mg | ORAL_TABLET | Freq: Three times a day (TID) | ORAL | Status: DC | PRN
Start: 2010-05-10 — End: 2012-04-23

## 2010-05-10 NOTE — Progress Notes (Addendum)
Chief Complaint   Patient presents with   . Back Pain       History of present illness(es):    Brittany [name] is a 44 year old female who comes to the office for back pain.  2005 had disc herniation but hasn't had pain like that in years.  Approx 1-2 days ago, was bending over to put lotion on lower legs and then developed excruciating sharp pain and has had to move slowly.  Using 400 mg ibuprofen; doing cold packs on back--with some improvement.  No urinary or bowel incontinence.  No numbness.  No radiating pain--mostly in SI joint area.    Review of systems:    No diarrhea  No constipation  No urinary complaints  No chest pain  No Known Allergies    Medical History:  Past Medical History   Diagnosis Date   . Infectious mononucleosis 08/2006     Patient Active Problem List   Diagnoses   . Infectious Mononucleosis   . Health maintenance examination   . Allergic Rhinitis   . Benign neoplasm of skin, site unspecified   . Cold sensitivity   . Vitamin D Deficiency   . Grave's disease   . GERD (Gastroesophageal Reflux Disease)   . Right foot pain   . Dysmenorrhea   . Acquired hypothyroidism   . Insomnia   . Sebaceous cyst   . Depression       Family HX:  none    Social Hx:   No smoking    PE:    Vitals:Blood pressure 108/78, pulse 76, temperature 98.2 F (36.8 C), temperature source Oral, resp. rate 16, weight 78.926 kg (174 lb), last menstrual period 04/18/2010.There is no height on file to calculate BMI.  Alert. Pleasant. Nad.  Nml straight leg raises  Motor strength 4/5 in lower extremeties  +2/4 DTR over patella  INtegument intact over low back with some tenderness mid spine/SI area.    Assessment/Plan:  Brittany [name] is a 44 year old female .    Brittany [name] was seen today for back pain.    Diagnoses and associated orders for this visit:    Back pain - no evidence of neurologic compromise; get xray; may require MRI if sxs do not improve  - X-Ray Lumbar Spine  - cyclobenzaprine (FLEXERIL) 5 MG tablet; Take 1  tablet by mouth 3 times daily as needed for Muscle Spasms.  - ibuprofen (MOTRIN) 800 MG tablet; Take 1 tablet by mouth every 8 hours as needed for Mild Pain (Pain Score 1-3) or Moderate Pain (Pain Score 4-6).  - Physical Therapy - Internal ([place])    Patient to return to the clinic for follow-up in prn.    Patient's medications at the end of this visit:   norethindrone-ethinyl estradiol (MICROGESTIN FE 1/20) 1-20 MG-MCG per tablet Take 1 tablet by mouth daily.   fluticasone propionate (FLONASE) 50 MCG/ACT nasal spray Spray 1 spray into each nostril 2 times daily.   levothyroxine (SYNTHROID) 112 MCG tablet Take 1 tablet by mouth daily. Brand name only   zolpidem (AMBIEN) 10 MG tablet Take 1 Tab by mouth nightly as needed for Insomnia.   ibuprofen (MOTRIN) 800 MG tablet Take 1 Tab by mouth every 8 hours as needed for Mild Pain.   cetirizine (ZYRTEC) 10 MG tablet Take 10 mg by mouth daily.

## 2010-05-10 NOTE — Telephone Encounter (Signed)
Noted.  Caron Presume RN

## 2010-05-10 NOTE — Patient Instructions (Signed)
See scheduling instructions below for physical therapy.    Please do x ray of back today.    Flexeril and motrin ordered.

## 2010-05-10 NOTE — Telephone Encounter (Signed)
Verbally confirmed name of Primary Care Provider: yes    What is reason for call: Pt hurt her back bending over to put lotion on her legs on Monday - pt states that it has not gotten better.  Pt wants referral to PT. Pt was given appt to see Dr Conley Rolls today 12/22 @ 10:40am.  Please note.      Confirmed Contact Number:yes    This message will be transmitted to our triage nurse, you can expect a call by the end of the working day.

## 2010-05-23 ENCOUNTER — Ambulatory Visit (INDEPENDENT_AMBULATORY_CARE_PROVIDER_SITE_OTHER)

## 2010-05-23 NOTE — Interdisciplinary (Signed)
CLINIC:Physical Therapy    REPORT TYPE:EVAL    Dictating Practitioner: Jana Hakim. Tereza Gilham, P.T.      DATE OF SERVICE: 05/23/2010    REASON FOR VISIT:low back pain      Therapy Start Time: 08:00 A  Therapy End Time: 09:00 A  Location: [facility]    Diagnosis: low back pain  ICD 9 Code:  Start of Care: 05/23/2010  Referring Physician: Eddie Candle    Referring Service: Internal Medicine    Reason for Referral: evaluation and treat    Preferred Language: English   Patient's Age: 45    Patient's Gender: F    Date of Onset (this episode) : 05/16/2010    OBJECTIVE:  Mechanism of Injury: other    Hx of Current Condition:  States approx. 2 weeks ago bent over to put  lotion on legs, and had extreme pain.  pt states prior to this was having  intermittent "twinges" over the past several years.  pt states symptoms  have been improving.  No numbess/tingling.    Medical history reviewed: Yes - no significant medical history      Precautions/Contraindications: none    Pain Assessment: 2/10      Pain at its Worst:  6/10    Frequency of Pain: constant    Pain Location:  low back (L4/5 area)    Pain Descriptor(s):  aching, throbbing    Pain is Worse With:   sitting >20', walking uphills,    Pain is Better With:  ibuprofen, ice, biofreeze, laying supine in 90/90    Diagnostic Tests:  X-RAY        Results of Diagnostic Tests:  Moderate L4/5  disk space narrowing and minimal bilateral osteoarthrosis of the   hips    Medications Affecting Therapy:  Pain    Social History:  pt works sitting at [employer].    Prior Level of Function:  no functional limitations    OBSERVATION:       Sitting Posture: fair       Standing Posture: flat      LOWER BODY:        L-S EVALUATION:  Movement Loss:                       Major       Moderate     Minimum      Nil         Pain  Flexion:                                         X  Extension:                                       X  Side Glide (R):                                             X  Side Glide (L):  X  Rotation (R):                                               X  Rotation (L):                                               X    Movement Testing:    Repetitive Movement Testsunremarkable  SIJ Screen: WNL  Manual Assessment: hypomobile throughout lumbar facet joints  Palpation: + tenderness to R lumbar paraspinals  Special Tests: -SLR  Neuro Screen: WNL  Other Findings: hip abd/ext 4/5  + bilat. HS/piriformis tightness                ASSESSMENT:  pt is a 45 year old female with intermittent low back pain who  reports a flare-up approx. 2 weeks ago. pt will benefit from skilled  physical therapy to address defecits in symptom management, lumbar ROM,  core stabilization and return to full actvitiies painfree.      Problems/Impairments:  impaired function, decreased ROM, decreased strength  and pain    TREATMENT PLAN:  Goals:          ST:  Independent with HEP 2 visits        LT:  Decrease pain from constant to intermittent 4 visits        pt able to sit> 60 minutes for work 6 visits            Interventions: HEP Instruction, Joint Mobilization, Manual Therapy,  Neuromuscular Re-education and Therapeutic Exercise      Treatment:  pt already was performing stretches, added and modified:  HS stretch  piriformis stretch  pelvic tilt 10x10"  bridge + SLR 3x15"  prone opp arm/leg 10x  standing ext    ice after x 10'      Rx Frequency:  2x/week   Rx Duration:  3 weeks    Patient/Family Teaching:  initiated    Patient's Goal:  sit longer    Rehab Potential:  good    Eval, rx plan, & goals discussed & agreed upon with:  patient    Response to Therapy:  good                    Electronically signed by:  Jana Hakim. [place], P.T. 05/23/2010 09:47 A      DD: 05/23/2010    DT:  05/23/2010 08:04 A  DocNo.:  [id number]  [facility]/                    Primary Care Physician:        [name] MD          cc:

## 2010-05-29 ENCOUNTER — Ambulatory Visit (INDEPENDENT_AMBULATORY_CARE_PROVIDER_SITE_OTHER): Payer: Self-pay

## 2010-05-29 ENCOUNTER — Ambulatory Visit (INDEPENDENT_AMBULATORY_CARE_PROVIDER_SITE_OTHER)

## 2010-05-29 NOTE — Interdisciplinary (Signed)
CLINIC:Phys Therapy    REPORT TYPE:TX NOTE    Dictating Practitioner: Jana Hakim. Margo Aye, P.T.      DATE OF SERVICE: 05/29/2010    REASON FOR VISIT:Follow-Up Visit    Time Treatment Started: 03:00 P  Time Treatment Ended: 03:45 P    Subjective:  pt states able to sit through movie overweeknd.    Pain: 4    Objective:    HS stretch 3x30"  piriformis stretch 3x30"  pelvic tilt with march 3x1'  S/L clam 3x15  SB bridge 3x10  SB crunch 2x10 with 10" hold  L4-5 PA joint mobs grade III    ice after x 10'                        Assessment:  pt progressing slowly, good awareness of posture.  Fatigue post treatment,  poor core stabilization.    Plan:  continue above and progress as able.                      Electronically signed by:  Jana Hakim. [place], P.T. 05/29/2010 03:53 P      DD: 05/29/2010    DT:  05/29/2010 03:02 P  DocNo.:  [id number]  [facility]/              Primary Care Physician:  [name] MD          cc:

## 2010-06-01 ENCOUNTER — Encounter (INDEPENDENT_AMBULATORY_CARE_PROVIDER_SITE_OTHER)

## 2010-06-05 ENCOUNTER — Ambulatory Visit (INDEPENDENT_AMBULATORY_CARE_PROVIDER_SITE_OTHER)

## 2010-06-05 ENCOUNTER — Ambulatory Visit (INDEPENDENT_AMBULATORY_CARE_PROVIDER_SITE_OTHER): Payer: Self-pay

## 2010-06-05 NOTE — Interdisciplinary (Signed)
CLINIC:Phys Therapy    REPORT TYPE:TX NOTE    Dictating Practitioner: Jana Hakim. Margo Aye, P.T.      DATE OF SERVICE: 06/05/2010    REASON FOR VISIT:Follow-Up Visit    Time Treatment Started: 03:00 P  Time Treatment Ended: 03:45 P    Subjective:  pt states increased pain from sitting at work today. Feels better when she  lays down.    Pain: 4    Objective:    warm up on bike x 5'  HS stretch 3x30"  piriformis stretch 3x30"  SB: bridge 2x10  SB crunch 2x10 with 10" hold  SB LTR 20x  SB knee flexion 20x  S/L clam 3x15  prone hip ext 3x15  prone opp. arm/leg 2x10              Assessment:  no new changes. able to perform therex without increased symptoms,  decreased symptoms to a 3/10 post treatment    Plan:  continue and progress                      Electronically signed by:  Jana Hakim. [place], P.T. 06/05/2010 03:55 P      DD: 06/05/2010    DT:  06/05/2010 03:09 P  DocNo.:  [id number]  [facility]/              Primary Care Physician:  [name] MD          cc:

## 2010-06-07 ENCOUNTER — Encounter (INDEPENDENT_AMBULATORY_CARE_PROVIDER_SITE_OTHER)

## 2010-06-11 ENCOUNTER — Encounter (INDEPENDENT_AMBULATORY_CARE_PROVIDER_SITE_OTHER)

## 2010-06-13 ENCOUNTER — Ambulatory Visit (INDEPENDENT_AMBULATORY_CARE_PROVIDER_SITE_OTHER)

## 2010-06-13 NOTE — Interdisciplinary (Signed)
CLINIC:Phys Therapy    REPORT TYPE:TX NOTE    Dictating Practitioner: Jana Hakim. Margo Aye, P.T.      DATE OF SERVICE: 06/13/2010    REASON FOR VISIT:Follow-Up Visit    Time Treatment Started: 01:00 P  Time Treatment Ended: 01:45 P    Subjective:  Brittany [name] states has been sitting all morning which has resulted in increased pain  in R low back/SI    Pain: 5    Objective:    bike x 5'  prone IR/ER stretch with hip  SI joint mobs  lumbar rotation stretch 3x30"  S/L clams 3x15  SB bridge 3x10  bridge with hip abd grn band 3x10    ice after x 10'            Assessment:  overall no new changes. Brittany [name] with tenderness along R SI today    Plan:  continue above and progress as able                      Electronically signed by:  Jana Hakim. [place], P.T. 06/13/2010 03:03 P      DD: 06/13/2010    DT:  06/13/2010 12:59 P  DocNo.:  [id number]  [facility]/              Primary Care Physician:  [name] MD          cc:

## 2010-06-15 ENCOUNTER — Ambulatory Visit (INDEPENDENT_AMBULATORY_CARE_PROVIDER_SITE_OTHER): Admitting: Internal Medicine

## 2010-06-15 ENCOUNTER — Encounter (INDEPENDENT_AMBULATORY_CARE_PROVIDER_SITE_OTHER): Payer: Self-pay | Admitting: Internal Medicine

## 2010-06-15 NOTE — Interdisciplinary (Signed)
 AVS instructions given to patient  post clinic visit with good understanding.

## 2010-06-15 NOTE — Patient Instructions (Signed)
Your lab tests should be done in the next month or so.

## 2010-06-15 NOTE — Progress Notes (Signed)
The patient returns for followup of her hypothyroidism, allergic rhinitis, GERD, and insomnia. In addition since her last visit she had an episode of severe back pain for which she saw Dr. Conley Rolls in December, who treated her with NSAIDs, Flexeril, and physical therapy.  She states she still has the bilateral low back pain but without radiation, numbness, tingling, or bladder or bowel incontinence. She is now taking Flonase since it is teary, but states her allergies are under control with Zyrtec as needed. She is having no reflux symptoms. She does note continued fatigue, but admits to snoring and states that her sister has sleep apnea. She has no other complaints today.    Past Medical History   Diagnosis Date   . Infectious mononucleosis 08/2006       Current Outpatient Prescriptions   Medication Sig   . cyclobenzaprine (FLEXERIL) 5 MG tablet Take 1 tablet by mouth 3 times daily as needed for Muscle Spasms.   Marland Kitchen ibuprofen (MOTRIN) 800 MG tablet Take 1 tablet by mouth every 8 hours as needed for Mild Pain (Pain Score 1-3) or Moderate Pain (Pain Score 4-6).   Marland Kitchen norethindrone-ethinyl estradiol (MICROGESTIN FE 1/20) 1-20 MG-MCG per tablet Take 1 tablet by mouth daily.   Marland Kitchen levothyroxine (SYNTHROID) 112 MCG tablet Take 1 tablet by mouth daily. Brand name only   . zolpidem (AMBIEN) 10 MG tablet Take 1 Tab by mouth nightly as needed for Insomnia.   Marland Kitchen cetirizine (ZYRTEC) 10 MG tablet Take 10 mg by mouth daily.   Marland Kitchen DISCONTD: fluticasone propionate (FLONASE) 50 MCG/ACT nasal spray Spray 1 spray into each nostril 2 times daily.       No Known Allergies    ROS--    No symptoms of hypo or hyperthyroidism: no decreased or increased weight, no feeling cold/chilly or excessively warm, no diarrhea or constipation, no undue sweatiness, anxiety or palpitations.    The patient denies cough, chest pain, dyspnea, wheezing or hemoptysis.    Patient denies any exertional chest pain, dyspnea, palpitations, syncope, orthopnea, edema or paroxysmal  nocturnal dyspnea.    The patient denies abdominal or flank pain, anorexia, nausea or vomiting, dysphagia, change in bowel habits or black or bloody stools or weight loss.    The patient denies swelling, numbness, tingling or weakness in the extremities.    PE--    She appears well, in no apparent distress.  Alert and oriented times three, pleasant and cooperative. Vital signs are as noted by the nurse.    Filed Vitals:    06/15/10 1553   BP: 96/64   Pulse: 76   Temp: 98.3 F (36.8 C)   TempSrc: Oral   Resp: 16   Weight: 79.379 kg (175 lb)       Thyroid not palpable, not enlarged, no nodules detected.    Chest is clear, no wheezing or rales. Normal symmetric air entry throughout both lung fields. No chest wall deformities or tenderness.    S1 and S2 normal, no murmurs, clicks, gallops or rubs. Regular rate and rhythm. Chest is clear; no wheezes or rales. No edema or JVD.    The abdomen is soft without tenderness, guarding, mass, rebound or organomegaly. Bowel sounds are normal. No CVA tenderness or inguinal adenopathy noted.    Extremities: extremities, peripheral pulses and reflexes normal, no edema, redness or tenderness in the calves or thighs, feet normal, good pulses, normal color, temperature and sensation.        A/P--    1.  Low back pain (724.2)--will check MRI and refer the patient to [facility]. MR LUMBAR SPINE, CONSULT/REFERRAL TO PHYSICAL THERAPY-OUTSIDE (NON-[place])   2. Acquired hypothyroidism (244.9)--she will have repeat TSH and free T4 drawn in approximately one month. If her T4 is still somewhat elevated, will slightly increase her levothyroxine dose.  TSH, BLOOD, FREE THYROXINE, BLOOD   3. Insomnia (780.52)--will evaluate as below.     4. Allergic rhinitis (477.9)--she can continue Zyrtec as needed.     5. GERD (gastroesophageal reflux disease) (530.81)--will follow.     6. Fatigue (780.79)--since it is possible she has sleep apnea given her symptoms, will refer to pulmonary sleep medicine for  evaluation and polysomnography.  CONSULT/REFERRAL TO PULM SLEEP MEDICINE CENTER

## 2010-06-18 ENCOUNTER — Ambulatory Visit (INDEPENDENT_AMBULATORY_CARE_PROVIDER_SITE_OTHER)

## 2010-06-18 ENCOUNTER — Ambulatory Visit (INDEPENDENT_AMBULATORY_CARE_PROVIDER_SITE_OTHER): Payer: Self-pay

## 2010-06-18 NOTE — Interdisciplinary (Signed)
CLINIC:Phys Therapy    REPORT TYPE:TX NOTE    Dictating Practitioner: Jana Hakim. Margo Aye, P.T.      DATE OF SERVICE: 06/18/2010    REASON FOR VISIT:Follow-Up Visit    Time Treatment Started: 07:30 A  Time Treatment Ended: 08:15 A    Subjective:  pt states continues with increased pain while sitting.    Pain: 3    Objective:    prone IR/ER stretch with hip  SI joint mobs  pelvic tilt with heel touch 3x1'  S/L clams 3x15 ea.  bridge + SLR ( pt with bilat. HS cramps-defer for today)  prone opp arm/leg 20x  quad opp arm/leg 20x  childs pose  HS stretch    bike x 10'    ice after x 10'                Assessment:  pt to have MRI scheduled this week. Continues with weakness in deep core  musculature.    Plan:  continue above and progress as able.                      Electronically signed by:  Jana Hakim. [place], P.T. 06/18/2010 08:30 A      DD: 06/18/2010    DT:  06/18/2010 07:52 A  DocNo.:  [id number]  [facility]/              Primary Care Physician:  [name] MD          cc:

## 2010-06-20 ENCOUNTER — Ambulatory Visit (INDEPENDENT_AMBULATORY_CARE_PROVIDER_SITE_OTHER): Payer: Self-pay

## 2010-06-20 ENCOUNTER — Encounter (INDEPENDENT_AMBULATORY_CARE_PROVIDER_SITE_OTHER)

## 2010-06-20 NOTE — Interdisciplinary (Signed)
CLINIC:Rehabilitation Services    REPORT TYPE:TX NOTE    Dictating Practitioner: Jana Hakim. Margo Aye, P.T.      DATE OF SERVICE: 06/20/2010    REASON FOR VISIT:No Show    The patient was a no show for the scheduled clinic appointment today.    pt called to cancel same day                  Electronically signed by:  Jana Hakim. [place], P.T. 06/20/2010 08:53 A        DD: 06/20/2010    DT:  06/20/2010 08:52 A  DocNo.:  [id number]  [facility]/        Referring Physician:  [name] MD            Primary Care Physician:  [name] MD          cc:

## 2010-06-25 ENCOUNTER — Ambulatory Visit (INDEPENDENT_AMBULATORY_CARE_PROVIDER_SITE_OTHER): Payer: Self-pay

## 2010-06-25 ENCOUNTER — Telehealth (INDEPENDENT_AMBULATORY_CARE_PROVIDER_SITE_OTHER): Payer: Self-pay | Admitting: Sleep Medicine

## 2010-06-25 ENCOUNTER — Ambulatory Visit (INDEPENDENT_AMBULATORY_CARE_PROVIDER_SITE_OTHER)

## 2010-06-25 NOTE — Interdisciplinary (Signed)
CLINIC:Phys Therapy    REPORT TYPE:TX NOTE    Dictating Practitioner: Jana Hakim. [place]ab[place] Heo, P.T.      DATE OF SERVICE: 06/25/2010    REASON FOR VISIT:Discharge    Time Treatment Started: 02:00 P  Time Treatment Ended: 02:45 P    Subjective:  Brittany [name] states having increased mid-back pain today for no apparent reason.  States getting MRI tomorrow.    Pain: 0    Objective:    bike x 10'    STM to R mid-back  grade III thoracic mobs  SI joint mobs  manual IR/ER stretch  HS stretch  piriformis stretch  SB bridge 3x10  pelvic tilt with heel tap 3x10  ball crunch 10x10" hold    ice after x 10'                  Assessment:  Brittany [name] continues with near-constant low back pain R>L.  Will have MRI tomorrow.  Brittany [name] going to continue therapy at another location.    Plan:  Discharge to [facility].                      Electronically signed by:  Jana Hakim. [place], P.T. 06/25/2010 03:43 P      DD: 06/25/2010    DT:  06/25/2010 02:06 P  DocNo.:  [id number]  [facility]/              Primary Care Physician:  [name] MD          cc:

## 2010-07-17 ENCOUNTER — Encounter (INDEPENDENT_AMBULATORY_CARE_PROVIDER_SITE_OTHER)

## 2010-07-17 ENCOUNTER — Encounter (INDEPENDENT_AMBULATORY_CARE_PROVIDER_SITE_OTHER): Payer: Self-pay | Admitting: Internal Medicine

## 2010-07-18 ENCOUNTER — Other Ambulatory Visit (INDEPENDENT_AMBULATORY_CARE_PROVIDER_SITE_OTHER)

## 2010-07-18 NOTE — Telephone Encounter (Signed)
Forwarded to Dr Pattricia Boss. Last seen on 06/15/2010. Next visit 09/13/2010.

## 2010-07-18 NOTE — Telephone Encounter (Signed)
From: [name]   To: Patric Dykes, MD   Sent: Tue Jul 17, 2010 11:14 PM   Subject: 1-Non Urgent Medical Advice    Dear Dr. Pattricia Boss,    I would like to be tested for Celiac disease - my sister and mother both have it. I continue to feel tired all the time, get headaches, and feel dizzy. I would also like to have my white blood cell count checked. Thank you!    Brittany [name]

## 2010-08-24 ENCOUNTER — Telehealth (INDEPENDENT_AMBULATORY_CARE_PROVIDER_SITE_OTHER): Payer: Self-pay | Admitting: Internal Medicine

## 2010-08-24 NOTE — Telephone Encounter (Signed)
Verbally confirmed name of Primary Care Provider: yes    What is reason for call: pt c/o a cold x's 1 wk. Please advise.    Confirmed Contact Number:yes

## 2010-08-24 NOTE — Telephone Encounter (Signed)
RN placed call to pt. Pt c/o cold s/s x 1 week. Fatigue, dizziness, nasal drip, low-grade fever x 4 days. Denies any SOB, diff breathing, Has tried baby aspirin, alka-seltzer night time. Advised pt to try mucinex, salin nasal spray and steam/humidifer. Pt agrees to plan. ED/UC precautions given. Advised pt of ER/UC precaution. Pt verbalized complete understanding and was appreciative.   Louanne Belton, RN

## 2010-09-13 ENCOUNTER — Ambulatory Visit (INDEPENDENT_AMBULATORY_CARE_PROVIDER_SITE_OTHER): Admitting: Internal Medicine

## 2010-09-13 ENCOUNTER — Encounter (INDEPENDENT_AMBULATORY_CARE_PROVIDER_SITE_OTHER): Payer: Self-pay | Admitting: Internal Medicine

## 2010-09-13 NOTE — Progress Notes (Signed)
The patient returns for followup of her hypothyroidism, low back pain, insomnia, allergic rhinitis, and GERD.  She states that her low back pain has significantly improved with physical therapy and she is not having any particular symptoms of hyper or hypothyroidism at the present time. She does still have difficulties with insomnia but has an appointment scheduled with pulmonary sleep medicine for next week. She denies any reflux symptoms or allergic symptoms at the present time.    Past Medical History   Diagnosis Date   . Infectious mononucleosis 08/2006       Current Outpatient Prescriptions   Medication Sig   . norethindrone-ethinyl estradiol (MICROGESTIN FE 1/20) 1-20 MG-MCG per tablet Take 1 tablet by mouth daily.   Marland Kitchen levothyroxine (SYNTHROID) 112 MCG tablet Take 1 tablet by mouth daily. Brand name only   . zolpidem (AMBIEN) 10 MG tablet Take 1 Tab by mouth nightly as needed for Insomnia.   Marland Kitchen cetirizine (ZYRTEC) 10 MG tablet Take 10 mg by mouth daily.   . cyclobenzaprine (FLEXERIL) 5 MG tablet Take 1 tablet by mouth 3 times daily as needed for Muscle Spasms.   Marland Kitchen ibuprofen (MOTRIN) 800 MG tablet Take 1 tablet by mouth every 8 hours as needed for Mild Pain (Pain Score 1-3) or Moderate Pain (Pain Score 4-6).       No Known Allergies    ROS--    No symptoms of hypo or hyperthyroidism: no decreased or increased weight, no feeling cold/chilly or excessively warm, no diarrhea or constipation, no undue sweatiness, anxiety or palpitations.    The patient denies cough, chest pain, dyspnea, wheezing or hemoptysis.    Patient denies any exertional chest pain, dyspnea, palpitations, syncope, orthopnea, edema or paroxysmal nocturnal dyspnea.    The patient denies abdominal or flank pain, anorexia, nausea or vomiting, dysphagia, change in bowel habits or black or bloody stools or weight loss.    The patient denies swelling, numbness, tingling or weakness in the extremities.    PE--    She appears well, in no apparent  distress.  Alert and oriented times three, pleasant and cooperative. Vital signs are as noted by the nurse.    Filed Vitals:    09/13/10 1629   BP: 107/68   Pulse: 77   Temp: 97.7 F (36.5 C)   TempSrc: Oral   Resp: 14   Weight: 78.926 kg (174 lb)       Thyroid not palpable, not enlarged, no nodules detected.    Chest is clear, no wheezing or rales. Normal symmetric air entry throughout both lung fields. No chest wall deformities or tenderness.    S1 and S2 normal, no murmurs, clicks, gallops or rubs. Regular rate and rhythm. Chest is clear; no wheezes or rales. No edema or JVD.    The abdomen is soft without tenderness, guarding, mass, rebound or organomegaly. Bowel sounds are normal. No CVA tenderness or inguinal adenopathy noted.    Extremities: extremities, peripheral pulses and reflexes normal, no edema, redness or tenderness in the calves or thighs, feet normal, good pulses, normal color, temperature and sensation.    A/P--    1. Acquired hypothyroidism (244.9)--the patient's TSH was slightly high at the last measurement in February, but she is somewhat reluctant to increase the Synthroid at this time. Will therefore recheck her labs in 3 months with adjustment at that time.  TSH, BLOOD, TOTAL T3, BLOOD, FREE THYROXINE, BLOOD   2. Insomnia (780.52)--follow up with pulmonary sleep medicine.  3. Low back pain (724.2)--improved. She will continue her exercises at home.     4. GERD (gastroesophageal reflux disease) (530.81)--continue current treatment.     5. Allergic rhinitis (477.9)--will follow.

## 2010-09-13 NOTE — Patient Instructions (Signed)
Your lab tests should be done 1 week before your next visit.

## 2010-09-20 ENCOUNTER — Institutional Professional Consult (permissible substitution) (INDEPENDENT_AMBULATORY_CARE_PROVIDER_SITE_OTHER): Admitting: Sleep Medicine

## 2010-09-20 NOTE — Progress Notes (Signed)
This office note has been dictated.

## 2010-09-20 NOTE — Progress Notes (Signed)
CLINIC: [facility] PULMONARY    REPORT TYPE: LETTER    Dictating Practitioner: Trisha Mangle, M.D.    DATE OF SERVICE:  09/20/2010    REASON FOR VISIT: CONSULT        Sep 20, 2010    [name], MD  [facility]    RE:  [name], [name]    Dear Dr. Pattricia Boss:    As you already know, [name] is a 45 year old female who for the  last year has complained of waking up in the middle of the night with  pounding of her chest palpitations of her heart. She also snores loudly. At  least she knows that she snores, although currently sleeps alone and  sometimes wakes up gasping for air and especially after a dream that she is  not able to breathe. About 2 years ago she was diagnosed with  hyperthyroidism and is status post radioactive IODINE therapy and currently  is hypothyroid and on Synthroid replacement therapy. She states that her  last TSH was actually on the high side. She has never been told that she  has apneic events. She knows she is very active while while in bed. She  kicks her legs. Her bed covers are in total disarray in the morning. She  also has bruxism and uses a night guard for this and about once a week she  wakes up sweating enough that she has to change her clothes. She denies  taking any antidepressants at this time. Her main concern is that she is  tired all the time. Occasionally, she complains of excessive sleepiness  having to sleep all day long.    In a typical 24-hour day, she goes to bed at 10:11 p.m., falls asleep  within 10 minutes and wakes up once or twice per night due to a need to  urinate or heart racing or sensation of shortness of breath, choking,  gasping, or thoughts and worries or anxiety sometimes. She gets up at 6 in  the morning to start her day without the help of an alarm clock. On the  weekend she may sleep in until 8:00 a.m. but feels exactly the same. She  gets about 7 to 8 hours of sleep per night. In the morning, she is usually  fatigued. She denies trouble staying  awake while driving. She takes an  occasional nap and it is sometimes refreshing.    Her current Epworth Sleepiness Scale score is 10 out of 24 which is  consistent with mild excessive daytime sleepiness.  Her sleep review of  systems as already noted, is consistent with snoring, gasping for air,  waking up with palpitations, occasionally headaches and feels very foggy in  the morning when she wakes up and vivid dreams where she is not able to  breathe. She also has the sweating at least once a week and bed covers  being in total disarray in the morning. She denies symptoms suggestive of  cataplexy, narcolepsy, restless leg syndrome, or other parasomnias.    FAMILY HISTORY: Strongly positive for her sister having obstructive sleep  apnea and also insomnia in her sister.    PAST MEDICAL HISTORY  1. Chronic sinus problems.  2. Clicking of the TMJ.  3. Wears a night guard for bruxism.  4. Hyperthyroidism 2 years ago, and hypothyroidism after radioactive iodine  therapy and on Synthroid replacement.  5. She thinks she may be the perimenopausal state.  6. She denies any other major medical  problems.    CURRENT MEDICATIONS  1. Synthroid.  2. Motrin as needed.  3. Microgestin FE.  4. Ambien 10 mg as needed for sleep.  5. Zyrtec.    ALLERGIES: NO KNOWN DRUG ALLERGIES.    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 She does not work nights or shifts. She does not smoke. She drinks  alcoholic beverages on occasion about once per week, 1 to 2 drinks at a  time, the last one around 8 p.m. She drinks 1 to 2 glasses of green tea  daily because it does help her stay alert. She does not exercise on a  regular basis and she lives alone.    PHYSICAL EXAMINATION  GENERAL: The patient is a well-developed, mildly overweight female in no  acute distress. Awake, alert, oriented x4, normal affect.  VITAL SIGNS: Normal, with a blood pressure 104/72, pulse 93, temperature of  97.4, weight 79.4 kg or  175 pounds, height 5 feet 9 inches, 175.3 cm, and  BMI 25.8, neck circumference was not measured.  HEENT: The extraocular movements were intact. Conjunctivae was pink and  anicteric. Nasal passages are patent, but the patient definitely had a  nasal septum deviation to the right with decreased air flow on the right  side but no obvious discharge. The jaw is normal size and position.  Dentition was in excellent repair. The tongue was large and scalloped.  Oropharyngeal opening shows a Mallampati class II with a normal uvula but  prominent tonsillar pillars, and diffuse erythema of the soft palate, well  demarcated from the hard palate.  NECK: Supple, without lymphadenopathy or masses. Carotid pulses are +2,  without bruits. Thyroid is nonpalpable.  LUNGS: Clear to auscultation.  HEART: Regular rate and rhythm, without murmurs, clicks, or gallops. At one  point, the patient felt that maybe her heart was fast, but her heart rate  was only 90 beats per minute.  EXTREMITIES: No edema, clubbing, cyanosis, deep tendon reflexes are +2  bilaterally.    IMPRESSION  1. This is a 45 year old female who complains of daytime fatigue, snoring  and waking up gasping for breath and occasional waking up with palpitations  and nighttime sweats. Physical examination shows diffuse erythema of the  soft palate and prominent tonsillar pillars.  The clinical presentation is  suggestive of sleep-disordered breathing such as obstructive sleep apnea;  however, it is difficult to determine the severity based on the current  findings.  2. Palpitations that wake the patient up. It is possible this could be due  to sleep-disordered breathing. Cannot rule out an arrhythmia during sleep  that has not been detected.  3. Restless sleep with bed covers in disarray in the morning. This  potentially could be due to untreated sleep apnea, but also could be due to  other disorders such as periodic leg movements during sleep.    RECOMMENDATIONS  1. At this  time, the patient agreed to a full-attended polysomnogram at  [facility] in order to determine the  presence and severity of sleep-disordered breathing.  2. Will also evaluate for the possibility of periodic leg movements during  sleep as well as bruxism.  3. The patient was briefly introduced to CPAP therapy. She agreed to a  trial of CPAP therapy with an auto CPAP unit if the study indicates this is  needed.  4. The patient will follow up in this clinic in 3 months  for further  evaluation. At that time, hopefully she will be on therapy if the study  indicates this is needed and we will be able to adjust and troubleshoot  this beneficial therapy.    Thank you very much for this interesting consultation. We will continue to  work with Brittany [name] until her sleep-related problems are under control.    Yours sincerely,        Trisha Mangle, MD            Job Number [id number]            Electronically signed by:  Trisha Mangle, M.D. 09/27/2010 01:45 P          DD: 09/20/2010    DT: 09/20/2010 12:54 P   DocNo.: [id number]  [id number]                 [id number]    Referring Physician:  [name] MD  [address] [place] [address]    Primary Care Physician:  [name] MD  [address] [place], [place] [address]    cc:    Skeet Simmer. Pattricia Boss, M.D.         Fax Recipient [phone]         [address]         [place] [address]

## 2010-10-02 ENCOUNTER — Encounter (INDEPENDENT_AMBULATORY_CARE_PROVIDER_SITE_OTHER): Payer: Self-pay | Admitting: Internal Medicine

## 2010-10-02 NOTE — Telephone Encounter (Signed)
Forwarded to Dr Pattricia Boss for advise/order. Thanks.

## 2010-10-02 NOTE — Telephone Encounter (Signed)
From: [name]   To: Patric Dykes, MD   Sent: Tue Oct 02, 2010 9:20 AM   Subject: 2-Procedural Question    Hello Dr. Pattricia Boss,    Would you order a cardiac stress test for me? I'm going to do the sleep study this Saturday May 19 and met with Dr. Ivor Reining, but continue to have an irregular heartbeat.    Thank you,  Brittany [name]

## 2010-10-25 ENCOUNTER — Telehealth (INDEPENDENT_AMBULATORY_CARE_PROVIDER_SITE_OTHER): Payer: Self-pay | Admitting: Internal Medicine

## 2010-10-25 NOTE — Telephone Encounter (Signed)
Verbally confirmed name of Primary Care Provider: yes    What is reason for call: Pt wants to talk to a nurse about some OTC flu remedies. Pt states that she is getting over the flu. Pt states if there is no answer it is ok to leave a detailed Vm. Please advise.    Confirmed Contact Number:yes

## 2010-10-25 NOTE — Telephone Encounter (Signed)
Ret'd.call to pt.: "I recently had a really bad cold/flu; I had a really bad stomachache, body aches, a slight fever, and a cold for 4 days." Some nasal congestion of clear mucous. Spoke w/Heather the last time for similar symp.and couldn't remember what she was told to take. Per conversation she had w/Heather recommended Mucinex, nasal irrigation, and steam/humidifier. Informed her of these recommendations and also recommend she take Pepto Bismol and/or otc meclizine for stomach upset/nausea. To call back as needed.  If further problems/questions to call back.  Verbalizes understanding and agrees with plan of care.  Caron Presume RN

## 2010-10-31 ENCOUNTER — Other Ambulatory Visit (INDEPENDENT_AMBULATORY_CARE_PROVIDER_SITE_OTHER): Payer: Self-pay | Admitting: Internal Medicine

## 2010-11-04 ENCOUNTER — Encounter (INDEPENDENT_AMBULATORY_CARE_PROVIDER_SITE_OTHER): Payer: Self-pay | Admitting: Sleep Medicine

## 2010-11-04 NOTE — Progress Notes (Signed)
[facility]  [address] [place], [address]  [place], [place] [address]  Phone [phone]    COMPREHENSIVE POLYSOMNOGRAPHY CLINICAL REPORT  Test ID: PSG Baseline  PATIENT INFORMATION  Test Date: 10/06/2010    Patient: Brittany [name], Brittany [name]  Pt# [id number]  DOB: 1965/07/10  Age: 45   Sex: female  ESS 10/24  BP 104/72 (in office)  Height: 175 cm  Weight: 79.4 kg BMI 25.8  NC   Referring Doctor: [name], MD    SUMMARY AND CONCLUSIONS:  Impression:   1. No significant obstructive sleep apnea (Total AHI 0.9/hr, REM AHI 4.5/hr, Non REM AHI 0.0/hr) consisting of rare REM related obstructive hypopneas and rare central apneas and mild intermittent snoring. This study is most consistent with mild primary snoring. This study does not fully rule out the upper airway resistance syndrome. No previous sleep study available for comparison.   2. No significant oxyhemoglobin desaturation (the patient spent 0.3% of the recorded time below a saturation of 89%. Average wake saturation was normal at 99%, average sleep saturation was normal at 95%, and nadir saturation was mildly reduced at 86%).  3. Moderate sleep fragmentation (arousal + awakening index 20.2/hr), associated with non-specific spontaneous events.   4. No periodic limb movements during sleep noted (PLMS index 0/hr).   5. Sleep quality was abnormal in that patient had higher levels of light sleep (Stage 1 and 2) and low deep sleep with difficulty consolidating sleep. Sleep onset and REM sleep latencies were normal. These abnormalities could be due to the first night effect of sleeping in the sleep laboratory which correlates with the patient complaint of sleeping worse than usual.   6. This study does not explain the patient's complaints of chronic fatigue. Doubt that the mild intermittent snoring is contributing much to the patient's current symptoms.     Recommendations:  1. Clinical correlation is advised.   2. Evaluation of hormonal and  other medical and psychiatric causes of fatigue is recommended.     CLINICAL INFORMATION:  Referral Reason: Clinical suspicion for obstructive sleep apnea.   History: 45 year old female with chronic fatigue, waking up with palpitations or gasping, loud snoring, night sweats, and bruxism (uses night guard). PMH is positive for hyperthyroidism resulting in hypothyroidism after treatment with radioactive iodine, chronic sinus problems, and clicking of the TMJ.  Medications:  Synthroid, Motrin prn, Microgestin FE, Ambien prn, and Zyrtec.  Post sleep study questionnaire: The patient felt that it took 30 minutes to fall asleep, that she slept for 5 hours, and that her overall sleep quality was worse than usual. She was awakened by noise in the morning.   Technician's comments: Patient had mild intermittent snoring and rare REM-related hypopneas.    Beck Depression Inventory: 13   (0-9  not depressed, 10-18 mild-moderate depression, 19-29 moderate-severe depression, 30-63 indicates severe depression)      POLYSOMNOGRAPHY DETAILS:  Study set-up and analysis: An overnight attended polysomnogram was performed using the [employer] polysomnography system. The following parameters were measured: Electroencephalogram (O1, O2, C3, C4, derivations), right and left Electro-oculogram, Electromyogram (Chin and tibialis anterior), oral-nasal air flow (thermistor), respiratory effort (thoracic and abdominal piezoelectric bands), snoring detection microphone, pulse oximetry, Electrocardiogram (Standard limb lead 2), and position sensor. The international 10-20 EEG electrode placement system was used. Standard impedance and biocalibration checks were performed. The Electroencephalogram was analyzed using the American Academy of Sleep Medicine criteria. Respiratory events were scores from the flow or effort channels  if the duration was 10 seconds or more. Hypopneas were scored as decrements in airflow of greater than 30% but less than  90% from the immediate baseline associated with a desaturation of 4% or more. Apneas were scores when the air flow decreased by 90% or more regardless of associated conditions. The apnea hypopnea index (AHI) was calculated using the total sleep time as the denominator.  Periodic limb movements were scored following standard criteria. The entire recording was reviewed and interpreted by a board certified sleep disorders specialist.    Quality of data: The patient tolerated the polysomnography procedure well. Excellent data quality was obtained. No complications noted.  Sleep parameters: Total sleep time was 330.5 minutes. Sleep efficiency was low at 80.4% (normal ? 85%). Sleep latency to stage 1 sleep was normal at 11.5 minutes (normal 12 to 20 minutes), and latency to persistent sleep was 51.5 minutes. This is consistent with normal pressure for sleep but trouble consolidating sleep. REM sleep latency was normal at 105 minutes (normal 70-110 minutes).   Sleep architecture: Sleep architecture was abnormal for the patient's age showing high levels of Stage 1 sleep at 13.9% (normal < 5%), high Stage 2 sleep at 61.3% (normal 45-55%), low slow wave sleep at 5% (normal 13-23%) and normal REM sleep at 19.8% (normal 20-25%).   Body position analysis: The patient spent 330.5 minutes of the total sleep time (330.5 minutes) in the supine position.   Arousal analysis: Total arousal index was elevated at 14.5/hr (normal ? 10/hr) consistent with mild sleep fragmentation. There were 31 awakenings longer than 15 seconds. The great majority of arousals and awakenings were associated with spontaneous events.  Leg movement analysis: 0 periodic limb movements were recorded with a PLMS index of 0/hr (normal < 5/hr).  Respiratory analysis: Total AHI was normal at 0.9/hr (normal < 5/hr) consisting of 5 events, including 0 Obstructive Apneas, 3 Obstructive Hypopneas, 0 Central Hypopneas, 2 Central Apneas, 0 Mixed hypopneas, and 0 Mixed  Apneas. REM related AHI was normal at 4.5/hr. Non REM AHI was normal at 0.0/hr. Mild intermittent snoring noted rarely associated with arousals.  Oxygenation analysis: No significant oxyhemoglobin desaturation. The patient spent 0.3% of the recorded time below a saturation of 89%. Average awake saturation was normal at 99% and average sleep saturation was normal at 95% (normal ? 95%). Nadir saturation was mildly reduced at 86%. Desaturation index was within normal limits at /hr (normal < 5/hr).  Heart rate analysis: No arrhythmias noted. Average heart rate during wakefulness was normal at 60 bpm, and average heart rate during sleep was normal at 62 bpm.   CPAP titration: N/A    Trisha Mangle, MD, MS, MPH, [facility]  Pulmonary/Critical Care/Sleep Medicine  Diplomate of the American Board of Sleep Medicine  Diplomate of the ABIM Sleep Medicine    Date of report preparation: 11/04/2010

## 2010-11-13 ENCOUNTER — Encounter (INDEPENDENT_AMBULATORY_CARE_PROVIDER_SITE_OTHER): Payer: Self-pay | Admitting: Internal Medicine

## 2010-11-13 NOTE — Telephone Encounter (Signed)
Order has been signed. However Dr. Winn Jock sees patients in neurology and therefore one of the other ENT physicians will take care of this.

## 2010-11-13 NOTE — Telephone Encounter (Signed)
Forwarded to Dr  Pattricia Boss.

## 2010-11-13 NOTE — Telephone Encounter (Signed)
From: [name]   To: Patric Dykes, MD   Sent: Tue Nov 13, 2010 9:49 AM   Subject: 2-Procedural Question    Dear Dr. Pattricia Boss,    Please refer me to Dr. Collie Siad office in [facility] so I can have my ears cleaned by his nurse. Thank you!    Brittany [name]

## 2010-11-22 ENCOUNTER — Encounter (INDEPENDENT_AMBULATORY_CARE_PROVIDER_SITE_OTHER): Payer: Self-pay | Admitting: "Endocrinology

## 2010-11-22 ENCOUNTER — Encounter (INDEPENDENT_AMBULATORY_CARE_PROVIDER_SITE_OTHER): Payer: Self-pay | Admitting: Internal Medicine

## 2010-11-22 ENCOUNTER — Other Ambulatory Visit (INDEPENDENT_AMBULATORY_CARE_PROVIDER_SITE_OTHER): Payer: Self-pay | Admitting: Internal Medicine

## 2010-11-22 ENCOUNTER — Ambulatory Visit (INDEPENDENT_AMBULATORY_CARE_PROVIDER_SITE_OTHER): Admitting: Obstetrics & Gynecology

## 2010-11-22 ENCOUNTER — Other Ambulatory Visit (INDEPENDENT_AMBULATORY_CARE_PROVIDER_SITE_OTHER): Payer: Self-pay | Admitting: Obstetrics & Gynecology

## 2010-11-22 VITALS — BP 120/78 | HR 94 | Temp 98.2°F | Ht 69.0 in | Wt 174.0 lb

## 2010-11-22 NOTE — Telephone Encounter (Signed)
From: [name]   To: [name], DO   Sent: Thu Nov 22, 2010 12:52 PM   Subject: 2-Procedural Question    Dear Dr. Sigurd Sos,    I'm going to start taking an iron supplement, as recommended by Dr. Josem Kaufmann, my OB/GYN. The supplement I bought has 75mg  of calcium in it. Should I take it at night instead of in the morning, so as not to interfere with the synthroid?    Best,  Brittany [name]

## 2010-11-22 NOTE — Progress Notes (Signed)
45 yo female G0 here for follow up of menorrhagia. Pt has been on low dose ocp for control of menses.  Pt also recently stabilized on thyroid medication.  Pt reports that despite ocp use, cycles have persisted in being heavy and long.  Last menstrual flow lasting 11 days.  Last emb over one year ago.  Last pap 6 months ago and normal.      Pt interested in further treatment options and particularly interested in option of endometrial ablation.      rec proceed with emb today.  Formal pelvic sono.  Check cbc.  Surgical request placed for hysteroscopy D&C, novasure endometrial ablation.    Endometrial Biopsy  November 22, 2010    Patient Name: Brittany [name]  MRN: [id number]  AGE: 45 year old   No data filed   Patient's last menstrual period was 10/26/2010.    Chief Complaint:   Chief Complaint   Patient presents with   . Amenorrhea     Blood Pressure   11/22/10 120/78   09/20/10 104/72   09/13/10 107/68     Current Outpatient Prescriptions   Medication Sig   . cyclobenzaprine (FLEXERIL) 5 MG tablet Take 1 tablet by mouth 3 times daily as needed for Muscle Spasms.   Marland Kitchen ibuprofen (MOTRIN) 800 MG tablet Take 1 tablet by mouth every 8 hours as needed for Mild Pain (Pain Score 1-3) or Moderate Pain (Pain Score 4-6).   Marland Kitchen norethindrone-ethinyl estradiol (MICROGESTIN FE 1/20) 1-20 MG-MCG per tablet Take 1 tablet by mouth daily.   Marland Kitchen levothyroxine (SYNTHROID) 112 MCG tablet Take 1 tablet by mouth daily. Brand name only   . zolpidem (AMBIEN) 10 MG tablet Take 1 Tab by mouth nightly as needed for Insomnia.   Marland Kitchen cetirizine (ZYRTEC) 10 MG tablet Take 10 mg by mouth daily.     Irregular spotting or bleeding: YES  Ultrasound: yes Date: //2009  History of Fibroids: YES    PAP Result: normal. Date: 12//2011 negative    Procedure:  Ashby Dawes and risks of procedure including pain, infection and uterine perforation reviewed with patient.  Written consent obtained.  Time out taken.  EGBUS - Nl female, no lesions.  Cervix: normal in appearance,  no lesions or masses  Uterus: normal sized, mobile and non-tender  Adnexa: no adnexal masses or tenderness  Cervix cleansed with Betadine  Pipelle sampling device inserted through cervix. Uterus sounded to 8 cm.  Tissue sent to pathology.  Tenaculum removed, sites hemostatic.   Pt stable after procedure.    Will notify pt of results.  OR request placed.    Rogelio Seen, MD  Signature Derived From Controlled Access Password, November 22, 2010, 10:47 AM

## 2010-11-23 NOTE — Telephone Encounter (Signed)
From: [name]   To: Patric Dykes, MD   Sent: Thu Nov 22, 2010 7:05 PM   Subject: 20-Other    Hello Dr. Pattricia Boss,    I had my thyroid levels tested today, but the lab mistakenly filed the results under Dr. Josem Kaufmann. Just wanted to let you know the results are on the computer. I was also tested for anemia, but don't understand what the results mean. I will see you on July 18.    Best,  Brittany [name]

## 2010-11-23 NOTE — Telephone Encounter (Signed)
Forwarded to Dr  Pattricia Boss.

## 2010-11-27 NOTE — Procedures (Signed)
 FINAL PATHOLOGIC DIAGNOSIS:  A: Endometrium, biopsy       -Benign endometrial tissue with evidence of exogenous hormone effect.  SPECIMEN(S) SUBMITTED:  A:  Endometrial biopsy  CLINICAL HISTORY:  ICD-9:   626.2 and V76.49.   Menorrhagia.  GROSS DESCRIPTION:  A:  The specimen (received in formalin, labeled with  the patient's name, medical record number, and "EMB") consists of multiple  fragments of tan-yellow to tan-brown soft tissue and mucus aggregating to  2.5 x 0.7 x 0.2 cm.  The specimen is filtered and entirely submitted in  cassette A1.  SM/IP/pd  CONFIDENTIAL HEALTH INFORMATION: Health Care information is personal and  sensitive information. If it is being faxed to you it is done so under  appropriate authorization from the patient or under circumstances that do  not require patient authorization. You, the recipient, are obligated to  maintain it in a safe, secure and confidential manner. Re-disclosure  without additional patient consent or as permitted by law is prohibited.  Unauthorized re-disclosure or failure to maintain confidentiality could  subject you to penalties described in federal and state law.  If you have  received this report or facsimile in error, please notify the [facility] immediately and destroy the received document(s).   Material reviewed and Interpreted and  Report Electronically Signed by:  Sofie Rower M.D., PhD [phone])  Attending Surgical Pathologist  11/27/10 06:49  Electronic Signature derived from a single  controlled access password

## 2010-11-29 ENCOUNTER — Encounter (INDEPENDENT_AMBULATORY_CARE_PROVIDER_SITE_OTHER): Payer: Self-pay | Admitting: Obstetrics & Gynecology

## 2010-12-05 ENCOUNTER — Encounter (INDEPENDENT_AMBULATORY_CARE_PROVIDER_SITE_OTHER): Payer: Self-pay | Admitting: Internal Medicine

## 2010-12-05 ENCOUNTER — Ambulatory Visit (INDEPENDENT_AMBULATORY_CARE_PROVIDER_SITE_OTHER): Admitting: Internal Medicine

## 2010-12-05 MED ORDER — LEVOTHYROXINE SODIUM 125 MCG OR TABS
125.0000 ug | ORAL_TABLET | Freq: Every day | ORAL | Status: DC
Start: 2010-12-05 — End: 2011-01-03

## 2010-12-05 NOTE — Patient Instructions (Signed)
Your lab tests should be done 1 in about 2 months.

## 2010-12-05 NOTE — Progress Notes (Signed)
The patient returns for followup of her hypothyroidism, low back pain, GERD, and insomnia.  She has been doing well except for continued insomnia due to her menorrhagia but will be having uterine ablation next week. She does admit to symptoms of hypothyroidism including fatigue, occasional cold intolerance, constipation, and dry skin. She states that her back pain and reflux are under good control. She does however complain of some new moles which she wishes to have evaluated.    Past Medical History   Diagnosis Date   . Infectious mononucleosis 08/2006       Current Outpatient Prescriptions   Medication Sig   . levothyroxine (SYNTHROID) 125 MCG tablet Take 1 tablet by mouth daily.   . cyclobenzaprine (FLEXERIL) 5 MG tablet Take 1 tablet by mouth 3 times daily as needed for Muscle Spasms.   Marland Kitchen ibuprofen (MOTRIN) 800 MG tablet Take 1 tablet by mouth every 8 hours as needed for Mild Pain (Pain Score 1-3) or Moderate Pain (Pain Score 4-6).   Marland Kitchen norethindrone-ethinyl estradiol (MICROGESTIN FE 1/20) 1-20 MG-MCG per tablet Take 1 tablet by mouth daily.   Marland Kitchen zolpidem (AMBIEN) 10 MG tablet Take 1 Tab by mouth nightly as needed for Insomnia.   Marland Kitchen cetirizine (ZYRTEC) 10 MG tablet Take 10 mg by mouth daily.   Marland Kitchen DISCONTD: levothyroxine (SYNTHROID) 112 MCG tablet Take 1 tablet by mouth daily. Brand name only       No Known Allergies    ROS--    No symptoms of hypo or hyperthyroidism: no decreased or increased weight, no feeling cold/chilly or excessively warm, no diarrhea or constipation, no undue sweatiness, anxiety or palpitations.    The patient denies cough, chest pain, dyspnea, wheezing or hemoptysis.    Patient denies any exertional chest pain, dyspnea, palpitations, syncope, orthopnea, edema or paroxysmal nocturnal dyspnea.    The patient denies abdominal or flank pain, anorexia, nausea or vomiting, dysphagia, change in bowel habits or black or bloody stools or weight loss.    The patient denies swelling, numbness, tingling or  weakness in the extremities.    PE--    She appears well, in no apparent distress.  Alert and oriented times three, pleasant and cooperative. Vital signs are as noted by the nurse.    Filed Vitals:    12/05/10 1340   BP: 103/70   Pulse: 78   Temp: 98.3 F (36.8 C)   TempSrc: Oral   Resp: 16   Weight: 79.833 kg (176 lb)       Thyroid not palpable, not enlarged, no nodules detected.    Chest is clear, no wheezing or rales. Normal symmetric air entry throughout both lung fields. No chest wall deformities or tenderness.    S1 and S2 normal, no murmurs, clicks, gallops or rubs. Regular rate and rhythm. Chest is clear; no wheezes or rales. No edema or JVD.    The abdomen is soft without tenderness, guarding, mass, rebound or organomegaly. Bowel sounds are normal. No CVA tenderness or inguinal adenopathy noted.    Extremities: extremities, peripheral pulses and reflexes normal, no edema, redness or tenderness in the calves or thighs, feet normal, good pulses, normal color, temperature and sensation.    Results for orders placed in visit on 11/22/10   HEMOGRAM, BLOOD       Component Value Range    WBC 5.8  4.0 - 10.0 (1000/mm3)    RBC 3.97  3.90 - 5.20 (mill/mm3)    Hgb 12.9  11.2 - 15.7 (  gm/dL)    Hct 16.1  09.6 - 04.5 (%)    MCV 94.5  79.0 - 95.0 (um3)    MCH 32.5 (*) 26.0 - 32.0 (pgm)    MCHC 34.4  32.0 - 36.0 (%)    RDW 12.3  12.0 - 14.0 (%)    MPV 12.3  9.4 - 12.4 (fL)    Plt Count 216  140 - 370 (1000/mm3)   FOLLICLE STIMULATING HORMONE, BLOOD       Component Value Range    FSH 5.4         A/P--    1. Acquired hypothyroidism (244.9)--her TSH continues to increase and she is somewhat more symptomatic than her last visit. Will therefore increase levothyroxine to 125 mcg and repeat labs in 2 months.  levothyroxine (SYNTHROID) 125 MCG tablet   2. Insomnia (780.52)--will follow.     3. Low back pain (724.2)--continue current exercises.     4. GERD (gastroesophageal reflux disease) (530.81)--continue current treatment.           5. Skin lesion (709.9)--will consult dermatology.  CONSULT/REFERRAL TO DERMATOLOGY

## 2010-12-05 NOTE — Interdisciplinary (Signed)
AVS instructions given to patient  post clinic visit with good understanding.

## 2010-12-06 ENCOUNTER — Encounter (HOSPITAL_BASED_OUTPATIENT_CLINIC_OR_DEPARTMENT_OTHER): Payer: Self-pay | Admitting: Obstetrics & Gynecology

## 2010-12-06 DIAGNOSIS — Z01818 Encounter for other preprocedural examination: Secondary | ICD-10-CM

## 2010-12-07 ENCOUNTER — Other Ambulatory Visit: Payer: Self-pay

## 2010-12-07 ENCOUNTER — Other Ambulatory Visit (INDEPENDENT_AMBULATORY_CARE_PROVIDER_SITE_OTHER): Payer: Self-pay | Admitting: Obstetrics & Gynecology

## 2010-12-07 ENCOUNTER — Encounter (INDEPENDENT_AMBULATORY_CARE_PROVIDER_SITE_OTHER): Admitting: Anesthesiology

## 2010-12-07 ENCOUNTER — Ambulatory Visit (INDEPENDENT_AMBULATORY_CARE_PROVIDER_SITE_OTHER): Admitting: Obstetrics & Gynecology

## 2010-12-07 ENCOUNTER — Encounter (INDEPENDENT_AMBULATORY_CARE_PROVIDER_SITE_OTHER): Payer: Self-pay | Admitting: Obstetrics & Gynecology

## 2010-12-07 VITALS — BP 122/77 | HR 89 | Temp 98.4°F | Ht 69.0 in | Wt 175.0 lb

## 2010-12-07 MED ORDER — IBUPROFEN 600 MG OR TABS
600.0000 mg | ORAL_TABLET | Freq: Four times a day (QID) | ORAL | Status: DC | PRN
Start: 2010-12-07 — End: 2011-03-13

## 2010-12-08 ENCOUNTER — Encounter (INDEPENDENT_AMBULATORY_CARE_PROVIDER_SITE_OTHER): Payer: Self-pay | Admitting: Obstetrics & Gynecology

## 2010-12-10 NOTE — Telephone Encounter (Signed)
From: [name]   To: Rogelio Seen., MD   Sent: Sat Dec 08, 2010 12:16 PM   Subject: 2-Procedural Question    Dear Dr. Josem Kaufmann,    I met with Dr. Alfonso Patten on 7/20; she said she would send you a message regarding my ultrasound results, but wanted to follow-up. Please review my ultrasound from 11/28/10. Dr. Alfonso Patten said my fibroid is within the wall of the endometrium, so another option might be a laparoscopic hysterectomy. I talked to your partner, and she agreed with going forward with the ablation as a first step, but I would appreciate your opinion as well. Thank you!    Sincerely,  Brittany [name]

## 2010-12-10 NOTE — Progress Notes (Signed)
GYNECOLOGY PRE-OPERATIVE H&P  Attending physician: Deak    Chief complaint: preop exam  Pain score:  0  Transfusions:  None prior, accepts blood products    History of Present Illness:  Brittany [name] is a 45 year old female G0P0000 with h/o menorrhagia who presents today for preop visit for hysteroscopy, D&C, possible resection of fibroid, and Novasure ablation. She has a h/o heavy menses, LMP 11/20/10 - lasted for 10 days.     Work-up:  11/22/10: EMB benign  11/22/10 pap: negative    11/28/10 Pelvic sono:  The uterus shows several intramural fibroids, largest measuring 3.7 x 3.2 cm   containing a subcentimeter cystic degeneration, abutting the endometrium. The  endometrial stripe is 1.8-mm. Both ovaries are normal measuring 2.6 x 1.3 x   1.7 cm on the right and 2.6 x 2.7 x 2.0 cm on the left. No free fluid is   seen    Review of Systems -   Constitutional: negative.  CV: negative.  Resp: negative.  GI: negative.  GU: negative.  Heme/Lymphatic: negative.  GYN: as above.      Obstetric History    G0   P0   T0   P0   A0   TAB0   SAB0   E0   M0   L0             Past Medical History   Diagnosis Date   . Infectious mononucleosis 08/2006   . Hypothyroidism      h/o hyperthyroidism s/p radioactive iodine   . Insomnia    . Menorrhagia        Past Surgical History   Procedure Date   . Cholecystectomy, lap 1995   . Left wrist tendon repair 1998   . Remove tonsils/adenoids,<12 y/o 51   . Wisdom teeth        Current Outpatient Prescriptions on File Prior to Visit   Medication Sig Dispense Refill   . levothyroxine (SYNTHROID) 125 MCG tablet Take 1 tablet by mouth daily.  30 tablet  5   . cyclobenzaprine (FLEXERIL) 5 MG tablet Take 1 tablet by mouth 3 times daily as needed for Muscle Spasms.  30 tablet  0   . ibuprofen (MOTRIN) 800 MG tablet Take 1 tablet by mouth every 8 hours as needed for Mild Pain (Pain Score 1-3) or Moderate Pain (Pain Score 4-6).  90 tablet  1   . norethindrone-ethinyl estradiol (MICROGESTIN FE 1/20) 1-20  MG-MCG per tablet Take 1 tablet by mouth daily.  84 Tab  4   . zolpidem (AMBIEN) 10 MG tablet Take 1 Tab by mouth nightly as needed for Insomnia.  30 Tab  2   . cetirizine (ZYRTEC) 10 MG tablet Take 10 mg by mouth daily.           Allergies   Allergen Reactions   . Vicodin (Hydrocodone-Acetaminophen) Hallucinations       History     Social History   . Marital Status: Married     Spouse Name: N/A     Number of Children: N/A   . Years of Education: N/A     Occupational History   . Not on file.     Social History Main Topics   . Smoking status: Never Smoker    . Smokeless tobacco: Never Used   . Alcohol Use: Yes      1 bottle wine /week   . Drug Use: No   . Sexually Active: Not  Currently     Other Topics Concern   . Not on file     Social History Narrative   . No narrative on file       Family History   Problem Relation Age of Onset   . Ovarian Cancer Neg Hx      but sister w/ovarian cysts age 48   . Breast Cancer Mother    . Other Sister      Uterine fibroids/hysterectomy       Exam  BP 122/77  Pulse 89  Temp(Src) 98.4 F (36.9 C) (Oral)  Ht 5\' 9"  (1.753 m)  Wt 79.379 kg (175 lb)  BMI 25.84 kg/m2  LMP 11/20/2010  Gen: INAD  CV: RRR  Pulm: CTAB, no wheezes or crackles  Abd: soft, nontender, no obvious masses  Pelvic: deferred to OR  Pelvic in prior clinic visit: Cervix: normal in appearance, no lesions or masses   Uterus: normal sized, mobile and non-tender   Adnexa: no adnexal masses or tenderness  Ext: No edema.     Pertinent labs:   Lab Results   Component Value Date    WBC 5.8 11/22/2010    RBC 3.97 11/22/2010    HGB 12.9 11/22/2010    HCT 37.5 11/22/2010    MCV 94.5 11/22/2010    MCHC 34.4 11/22/2010    RDW 12.3 11/22/2010    PLT 216 11/22/2010    PLT 225 09/21/2008    MPV 12.3 11/22/2010       Assessment:  45 year old G0P0000 with heavy menses scheduled for hysteroscopy, D&C, possible polyp/fibroid resection, Novasure ablation with Dr. Josem Kaufmann at [facility] 7:30 12/28/10.    Plan:  -reviewed risks of procedure and patient signed  consent today  -discussed that her Novasure ablation may not be as successful if this fibroid distorts the cavity somewhat. However, will try in the OR. May get fibroid resection pending what they find on hysteroscopy.  -no NSAIDS/ASA for week prior to procedure  -reviewed NPO after midnight before procedure  -reviewed post-op expectations  --pre-op labs ordered  -hypothyroidism being monitored by PCP. Last TSH 6.4. Pt on synthroid.     Pain management needs/options discussed? Yes -- motrin, percocet (prn) Rx provided  Does this patient have Advance Directives? No  Resuscitative status: Full Code, Full Care     The risks, benefits and alternatives of the planned procedure have been discussed with the patient and/or her legal representative, all questions have been answered and they agree to proceed.   The risks, benefits, and alternatives to surgery have been thoroughly reviewed with the patient. These risks include, but are not limited to the following.Marland KitchenMarland Kitchen   1) The risk of bleeding requiring a blood transfusion. The risk of infection with the HIV virus (1/2,000,000) and Hepatitis virus (1/70,000) has been reviewed. Autologous blood donation has been discussed.   2) The risks of infection requiring antibiotics, a prolonged hospital course, and possible reoperation have been reviewed. Wound issues  have also been discussed.   3) The risk of damage to adjacent organs (tubes, ovaries,bowel, bladder, ureters, nerves, and vessels) requiring further surgical intervention has been reviewed.   4)The risk of medical complications including, but not limited to DVT, PE, MI, CVA, and death has been reviewed.   5) other risks related to specific procedure: uterine perforation, cervical laceration, vaginal laceration, possible decreased effectiveness of Novasure given fibroid uterus.     Discussed with Dr. Laurence Ferrari,   Jackquline Denmark, MD

## 2010-12-10 NOTE — H&P (Signed)
GYNECOLOGY PRE-OPERATIVE H&P  Attending physician: Deak    Chief complaint: preop exam  Pain score:  0  Transfusions:  None prior, accepts blood products    History of Present Illness:  Brittany [name] is a 45 year old female G0P0000 with h/o menorrhagia who presents today for preop visit for hysteroscopy, D&C, possible resection of fibroid, and Novasure ablation. She has a h/o heavy menses, LMP 11/20/10 - lasted for 10 days.     Work-up:  11/22/10: EMB benign  11/22/10 pap: negative    11/28/10 Pelvic sono:  The uterus shows several intramural fibroids, largest measuring 3.7 x 3.2 cm   containing a subcentimeter cystic degeneration, abutting the endometrium. The  endometrial stripe is 1.8-mm. Both ovaries are normal measuring 2.6 x 1.3 x   1.7 cm on the right and 2.6 x 2.7 x 2.0 cm on the left. No free fluid is   seen    Review of Systems -   Constitutional: negative.  CV: negative.  Resp: negative.  GI: negative.  GU: negative.  Heme/Lymphatic: negative.  GYN: as above.      Obstetric History    G0   P0   T0   P0   A0   TAB0   SAB0   E0   M0   L0             Past Medical History   Diagnosis Date   . Infectious mononucleosis 08/2006   . Hypothyroidism      h/o hyperthyroidism s/p radioactive iodine   . Insomnia    . Menorrhagia        Past Surgical History   Procedure Date   . Cholecystectomy, lap 1995   . Left wrist tendon repair 1998   . Remove tonsils/adenoids,<12 y/o 74   . Wisdom teeth        Current Outpatient Prescriptions on File Prior to Visit   Medication Sig Dispense Refill   . levothyroxine (SYNTHROID) 125 MCG tablet Take 1 tablet by mouth daily.  30 tablet  5   . cyclobenzaprine (FLEXERIL) 5 MG tablet Take 1 tablet by mouth 3 times daily as needed for Muscle Spasms.  30 tablet  0   . ibuprofen (MOTRIN) 800 MG tablet Take 1 tablet by mouth every 8 hours as needed for Mild Pain (Pain Score 1-3) or Moderate Pain (Pain Score 4-6).  90 tablet  1   . norethindrone-ethinyl estradiol (MICROGESTIN FE 1/20) 1-20  MG-MCG per tablet Take 1 tablet by mouth daily.  84 Tab  4   . zolpidem (AMBIEN) 10 MG tablet Take 1 Tab by mouth nightly as needed for Insomnia.  30 Tab  2   . cetirizine (ZYRTEC) 10 MG tablet Take 10 mg by mouth daily.           Allergies   Allergen Reactions   . Vicodin (Hydrocodone-Acetaminophen) Hallucinations       History     Social History   . Marital Status: Married     Spouse Name: N/A     Number of Children: N/A   . Years of Education: N/A     Occupational History   . Not on file.     Social History Main Topics   . Smoking status: Never Smoker    . Smokeless tobacco: Never Used   . Alcohol Use: Yes      1 bottle wine /week   . Drug Use: No   . Sexually Active: Not  Currently     Other Topics Concern   . Not on file     Social History Narrative   . No narrative on file       Family History   Problem Relation Age of Onset   . Ovarian Cancer Neg Hx      but sister w/ovarian cysts age 47   . Breast Cancer Mother    . Other Sister      Uterine fibroids/hysterectomy       Exam  BP 122/77  Pulse 89  Temp(Src) 98.4 F (36.9 C) (Oral)  Ht 5\' 9"  (1.753 m)  Wt 79.379 kg (175 lb)  BMI 25.84 kg/m2  LMP 11/20/2010  Gen: INAD  CV: RRR  Pulm: CTAB, no wheezes or crackles  Abd: soft, nontender, no obvious masses  Pelvic: deferred to OR  Pelvic in prior clinic visit: Cervix: normal in appearance, no lesions or masses   Uterus: normal sized, mobile and non-tender   Adnexa: no adnexal masses or tenderness  Ext: No edema.     Pertinent labs:   Lab Results   Component Value Date    WBC 5.8 11/22/2010    RBC 3.97 11/22/2010    HGB 12.9 11/22/2010    HCT 37.5 11/22/2010    MCV 94.5 11/22/2010    MCHC 34.4 11/22/2010    RDW 12.3 11/22/2010    PLT 216 11/22/2010    PLT 225 09/21/2008    MPV 12.3 11/22/2010       Assessment:  45 year old G0P0000 with heavy menses scheduled for hysteroscopy, D&C, possible polyp/fibroid resection, Novasure ablation with Dr. Josem Kaufmann at [facility] 7:30 12/28/10.    Plan:  -reviewed risks of procedure and patient signed  consent today  -discussed that her Novasure ablation may not be as successful if this fibroid distorts the cavity somewhat. However, will try in the OR. May get fibroid resection pending what they find on hysteroscopy.  -no NSAIDS/ASA for week prior to procedure  -reviewed NPO after midnight before procedure  -reviewed post-op expectations  --pre-op labs ordered  -hypothyroidism being monitored by PCP. Last TSH 6.4. Pt on synthroid.     Pain management needs/options discussed? Yes -- motrin, percocet (prn) Rx provided  Does this patient have Advance Directives? No  Resuscitative status: Full Code, Full Care     The risks, benefits and alternatives of the planned procedure have been discussed with the patient and/or her legal representative, all questions have been answered and they agree to proceed.   The risks, benefits, and alternatives to surgery have been thoroughly reviewed with the patient. These risks include, but are not limited to the following.Marland KitchenMarland Kitchen   1) The risk of bleeding requiring a blood transfusion. The risk of infection with the HIV virus (1/2,000,000) and Hepatitis virus (1/70,000) has been reviewed. Autologous blood donation has been discussed.   2) The risks of infection requiring antibiotics, a prolonged hospital course, and possible reoperation have been reviewed. Wound issues  have also been discussed.   3) The risk of damage to adjacent organs (tubes, ovaries,bowel, bladder, ureters, nerves, and vessels) requiring further surgical intervention has been reviewed.   4)The risk of medical complications including, but not limited to DVT, PE, MI, CVA, and death has been reviewed.   5) other risks related to specific procedure: uterine perforation, cervical laceration, vaginal laceration, possible decreased effectiveness of Novasure given fibroid uterus.     Discussed with Dr. Assunta Curtis,   Jackquline Denmark, MD

## 2010-12-11 NOTE — Progress Notes (Signed)
Attending Note:   Subjective: 45 yo female here for preoperative counseling for hysteroscopy, D+C and endometrial ablation.  I reviewed the history.   Patient interviewed and examined.   Review of Systems (ROS): As per the resident's note.   Past Medical, Family, Social History: As per the resident's note.   I have examined the patient and I concur with the resident's exam.   Assessment and plan reviewed with the resident physician.   I agree with the resident's plan as documented.   See the resident's note for further details.  Risks reviewed, all questions answered. Adela Glimpse, MD

## 2010-12-13 ENCOUNTER — Encounter (INDEPENDENT_AMBULATORY_CARE_PROVIDER_SITE_OTHER): Payer: Self-pay | Admitting: Obstetrics & Gynecology

## 2010-12-13 NOTE — Telephone Encounter (Signed)
From: [name]   To: Macario Golds, RN   Sent: Tue Dec 11, 2010 9:58 AM   Subject: Blood test - need another?    Dear [name],    I got the blood test results from the required pre-operative bloodwork I did on July 20. The result says "need another sample" - do I need to have more bloodwork done prior to my August 10 surgery? I will be out of town July 30-Aug 3.    Thank you,  Brittany [name]    ----- Message -----   From: Macario Golds, RN   Sent: 12/10/2010 8:31 AM PDT   To: [name]  Subject: (No subject)     Dear [name],    I am forwarding your msg to Dr. Josem Kaufmann.    Sincerely-triage nurse

## 2010-12-14 ENCOUNTER — Ambulatory Visit (HOSPITAL_BASED_OUTPATIENT_CLINIC_OR_DEPARTMENT_OTHER): Payer: Self-pay | Admitting: Otolaryngology

## 2010-12-14 VITALS — BP 125/68 | HR 91

## 2010-12-14 NOTE — Progress Notes (Signed)
Brittany [name] is here today for ear cleaning. 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 MRI 2009 shows normal IAC.    Her right hearing loss started in 2005 and was progressive. Constant right-sided tinnitus. No vertigo. No history of ear surgery.    Past Medical History   Diagnosis Date   . Infectious mononucleosis 08/2006   . Hypothyroidism      h/o hyperthyroidism s/p radioactive iodine   . Insomnia    . Menorrhagia        Past Surgical History   Procedure Date   . Cholecystectomy, lap 1995   . Left wrist tendon repair 1998   . Remove tonsils/adenoids,<12 y/o 40   . Wisdom teeth        Current Outpatient Prescriptions on File Prior to Visit   Medication Sig Dispense Refill   . ibuprofen (MOTRIN) 600 MG tablet Take 1 tablet by mouth every 6 hours as needed for Mild Pain (Pain Score 1-3).  60 tablet  0   . levothyroxine (SYNTHROID) 125 MCG tablet Take 1 tablet by mouth daily.  30 tablet  5   . cyclobenzaprine (FLEXERIL) 5 MG tablet Take 1 tablet by mouth 3 times daily as needed for Muscle Spasms.  30 tablet  0   . ibuprofen (MOTRIN) 800 MG tablet Take 1 tablet by mouth every 8 hours as needed for Mild Pain (Pain Score 1-3) or Moderate Pain (Pain Score 4-6).  90 tablet  1   . norethindrone-ethinyl estradiol (MICROGESTIN FE 1/20) 1-20 MG-MCG per tablet Take 1 tablet by mouth daily.  84 Tab  4   . zolpidem (AMBIEN) 10 MG tablet Take 1 Tab by mouth nightly as needed for Insomnia.  30 Tab  2   . cetirizine (ZYRTEC) 10 MG tablet Take 10 mg by mouth daily.           Allergies   Allergen Reactions   . Vicodin (Hydrocodone-Acetaminophen) Hallucinations       ROS  10 point review of systems negative except as noted above.    PE:  BP 125/68  Pulse 91  LMP 11/20/2010    Bilateral cerumen impaction cleaned with microinstruments  Right anterior exostosis- non-obstructive        Audiogram from 11/2009: Moderate mixed loss AD, Left wnl. SRT 60 right, 5 left. SDS 48% right, 100%  left.      A/P: This is a 45 year old with AD hearing loss. She had ears cleaned today and audio ordered at [facility] to discuss BAHA and [employer] as well. RTC prn.      Signed by    Earmon Phoenix, MD  Otology/Neurotology/Skull Base Surgery Fellow

## 2010-12-16 NOTE — Progress Notes (Signed)
I have reviewed the patient history and exam with the fellow.  Assessment and plan reviewed with the fellow. I agree with the fellow's plan as documented.   See the fellow's note for further details.    Shenelle Klas, MD  Otolaryngology / Head and Neck Surgery

## 2010-12-17 NOTE — Telephone Encounter (Signed)
No need for another blood draw.  Please inform pt.

## 2010-12-26 ENCOUNTER — Ambulatory Visit (INDEPENDENT_AMBULATORY_CARE_PROVIDER_SITE_OTHER): Admitting: Sleep Medicine

## 2010-12-26 VITALS — BP 107/68 | HR 85 | Temp 98.4°F | Ht 69.0 in | Wt 174.0 lb

## 2010-12-26 NOTE — Progress Notes (Addendum)
S: Brittany [name] is a 45 year old female s/p thyroid radiation and synthroid supplementation here for follow-up of her recent sleep study with concern for OSA. On last visit she complained of daytime fatigue and occasional wake-ups with shortness of breath and palpitations.  She also reported snoring and blankets in disarray.  Her sleep study was negative without evidence of OSA or limb movements.  She continues to have day time fatigue now and has recently increased her synthroid due to elevated TSH.  She has also had increased vaginal bleeding and has an endometrial ablation planned. Recent hemoglobin was normal.    Current ESS Epworth Total Score: 9 /24 (Baseline ESS 10/24; < 10/24 normal)    O: WD overweight female in NAD, awake and alert, oriented x 4, normal affect. Vital signs stable.   BP 107/68  Pulse 85  Temp(Src) 98.4 F (36.9 C) (Oral)  Ht 5\' 9"  (1.753 m)  Wt 78.926 kg (174 lb)  BMI 25.70 kg/m2  SpO2 100%  LMP 11/20/2010  Nasal passages are patent, midline septum, normal mucosa, no discharge.  Jaw is normal size and position.   Tongue is normal  Oropharyngeal opening has a Mallampati class 1     TSH 6.4 (11/22/10)    Impression:  1. No obstructive sleep apnea or other sleep related cause for her fatigue.  We went over her sleep study in detail and answered the patient's questions.  Her fatigue is likely related to her thyroid disorder.  2. Overweight.  Patient has mild weight gain since last visit with BMI over 25.    3, Hypothyroidism - already being addressed by PCP.    Plan:  1. We discussed getting down to goal weight with BMI of 23 to reduce the risk of snoring and OSA in the future.  2. Patient was provided with life style changes for exercise and weight reduction.  3. Patient was provided sleep hygiene information consisting of schedule, duration, and quality.  4. Patient is discharged from clinic    I interviewed and examined patient Brittany [name] in conjunction with Dr. Jacolyn Reedy.  The impression and plan was arrived at after discussion of the case. The note was reviewed and edited. We discussed her PSG at length. She did not have OSA. It is possible that her original OSA-like symptoms were due to her relative hypothyroidism that is currently being addressed. I agree with sleep hygiene recommendations and weight loss to optimum levels. Patient will follow in a PRN basis.   Trisha Mangle, MD

## 2010-12-28 ENCOUNTER — Encounter (HOSPITAL_BASED_OUTPATIENT_CLINIC_OR_DEPARTMENT_OTHER): Payer: Self-pay | Admitting: Student in an Organized Health Care Education/Training Program

## 2010-12-31 NOTE — Op Note (Signed)
Dictating Practitioner: Loren Racer. Sherilyn Banker, M.D.     Staff Physician:  Rogelio Seen, M.D.    Date of Operation:  12/28/2010    PREOPERATIVE DIAGNOSIS:  Menorrhagia.  POSTOPERATIVE DIAGNOSIS:  Menorrhagia.  PROCEDURE:  Hysteroscopy and endometrial ablation with NovaSure device.  SURGEON/STAFF:  Rogelio Seen, MD  ASST: Loren Racer. Sherilyn Banker, MD    ANESTHESIA:  General.  FINDINGS: Normal sized retroverted mobile uterus on exam. No adnexal masses  were noted. Hysteroscopic findings included a tortuous, curved, normal  endocervical canal, bicornuate uterus, and a fungal polyp versus fundal  fibroid approximately 1 cm x 1 cm attached to the left cornuate. Also there  was another fibroid at the left posterior portion of the lower uterine  segment.  SPECIMENS: Uterine polyp versus fibroid which was sent to pathology.  IV FLUIDS:  750 mL lactated Ringer's.  BLOOD PRODUCTS: None.  ESTIMATED BLOOD LOSS:  Below 25 mL.  URINE OUTPUT: Straight catheterization was performed prior to procedure.  COMPLICATIONS: None.  DISPOSITION: Stable to Post Anesthesia Care Unit.    INDICATIONS: The patient is a 45 year old gravida 0, para 0, presented to  clinic with a long-standing history of menorrhagia.    DESCRIPTION OF PROCEDURE: The patient was brought into the operating room  where she was correctly identified and a time-out was taken. She was  prepped and draped in a normal sterile fashion in the dorsal lithotomy  position with candy-cane stirrups. Bimanual exam under anesthesia was  performed and was notable for a regular sized uterus in the retroverted  position with no palpable masses.    A sterile weighted speculum was placed in the patient's vagina and an  anterior Sims retractor was placed to allow for better visualization to the  anterior lip of the cervix. A single-tooth tenaculum was then applied to  the anterior cervix. At this time a uterine sound was gently introduced  into the uterus and a complete length was noted to be 9  cm. The sound was  removed and a 5 mm hysteroscope was placed into the uterine cavity.  Difficulty was obtained upon placing the hysteroscope into the uterine  cavity and so a second tenaculum was placed on the posterior lip of the  cervix. The hysteroscope was now gently entered into the uterine cavity and  it was noted that the uterine cavity was noted for a bicornuate uterus with  possible septation. Both ostia were visualized and the endocervical length  was measured under direct visualization and was found to be 5.5 cm in  length. Based on the sounding measurement of 9 cm and cervical length of  5.5 cm, the uterine cavity was estimated to be 5.5 cm. During hysteroscopy,  it was noted that there was a polyp attached to the uterine wall of the  left cornuate at the left uterine horn and also a possible fibroid at the  left lower uterine segment. Those were removed using the morcellator and  was used to remove the polyp at its base.    Now the NovaSure ablation device was introduced into the uterine cavity in  the normal fashion.  The intercornual width was tested using the device and  it was noted to be 2.5 cm. An intracavitary assessment was conducted. The  cavity was found to be intact and the NovaSure ablation device proceeded  for a total of 80 seconds. The settings included a uterine cavity length of  5.5 cm and a width of 2.5 cm. After successful  completion of the ablation,  the NovaSure device was removed. The mesh was inspected and noted to be  caked with desiccated tissue. At this time the 5 mm hysteroscope was  reintroduced into the cavity and the uterine cavity was inspected and noted  to be desiccated and cauterized. There was no evidence of perforation.    The hysteroscope was removed and the patient was brought out of anesthesia  in the operating room. The patient tolerated the procedure well and was  taken to the recovery area in stable condition. Sponge and instrument  counts were correct times  two at the end of the procedure.    For hysteroscopy a total of 120 mL of total saline were measured with 280  mL of normal saline deficit.    Dr. Reuel Derby, the attending physician, was present and scrubbed for the  entire procedure.      Reviewed & Electronically Signed by:  Loren Racer. Sherilyn Banker, M.D. 01/15/2011 10:57 A    Electronically signed by:  Rogelio Seen, M.D. 01/03/2011 02:16 P    DD: 12/31/2010    DT: 12/31/2010 02:31 P   DocNo.: [id number]  MLA/dld    Referring Physician:  Reuel Derby MD  [address]  [place], [place] [address]    Primary Care Physician:  [name] MD  [address] [place], [place] [address]    cc:

## 2011-01-02 NOTE — Procedures (Signed)
FINAL PATHOLOGIC DIAGNOSIS:  A: Endometrial/submucosal fibroid or polyp, biopsy       -Fragments of submucosal smooth muscle c/w benign leiomyoma.  SPECIMEN(S) SUBMITTED:  A: endometrial/submucosal fibroid or polyp  CLINICAL HISTORY:  ICD-9 code:  626.2.  GROSS DESCRIPTION:  A: The specimen (received in formalin, labeled with the  patient's name, medical record number and "endometrium/submucosal fibroid  polyp") consists of a stockinet containing several pieces of white-tan firm  tissue measuring 0.4 x 0.3 x 0.2 cm in aggregate. The specimen is entirely  submitted in cassette A1.  AY/SK/jb  CONFIDENTIAL HEALTH INFORMATION: Health Care information is personal and  sensitive information. If it is being faxed to you it is done so under  appropriate authorization from the patient or under circumstances that do  not require patient authorization. You, the recipient, are obligated to  maintain it in a safe, secure and confidential manner. Re-disclosure  without additional patient consent or as permitted by law is prohibited.  Unauthorized re-disclosure or failure to maintain confidentiality could  subject you to penalties described in federal and state law.  If you have  received this report or facsimile in error, please notify the [place]  [facility] immediately and destroy the received document(s).   Material reviewed and Interpreted and  Report Electronically Signed by:  Eldridge Dace M.D. [phone])  Attending Surgical Pathologist  01/02/11 09:23  Electronic Signature derived from a single  controlled access password

## 2011-01-03 ENCOUNTER — Encounter (INDEPENDENT_AMBULATORY_CARE_PROVIDER_SITE_OTHER): Payer: Self-pay | Admitting: Internal Medicine

## 2011-01-03 ENCOUNTER — Telehealth (INDEPENDENT_AMBULATORY_CARE_PROVIDER_SITE_OTHER): Payer: Self-pay | Admitting: Obstetrics & Gynecology

## 2011-01-03 MED ORDER — LEVOTHYROXINE SODIUM 125 MCG OR TABS
125.0000 ug | ORAL_TABLET | Freq: Every day | ORAL | Status: DC
Start: 2011-01-03 — End: 2011-09-18

## 2011-01-03 NOTE — Telephone Encounter (Signed)
PT CALLING STATED STILL IN A LOT OF PAIN UNABLE TO DUE DAILY ROUTINES.

## 2011-01-03 NOTE — Telephone Encounter (Signed)
Spoke to pt        Had endometrial ablation    Last friday         Calls with    Questions     activitiy level    Still needs to be  On     Pelvic rest      Reminded pt   Recovery could take longer   Than a few days    Afebrile       Precautions given

## 2011-01-03 NOTE — Telephone Encounter (Signed)
Pt called.  Informed of path results.  Still having some cramping and taking percocet at night which relieves pain.  Will send off work letter until 2 wks post op

## 2011-01-03 NOTE — Telephone Encounter (Signed)
From: [name]   To: Patric Dykes, MD   Sent: Thu Jan 03, 2011 8:59 AM   Subject: 2-Procedural Question    Hello Dr. Pattricia Boss,    I entered a refill request for the synthroid prescription online, but can't tell if it is for 30 pills or 90. If possible, please make it for 90. Thank you!    [name]

## 2011-01-03 NOTE — Telephone Encounter (Signed)
Dr Pattricia Boss, please review last TSH result and advise if patient need to continue the same dose. Thanks.

## 2011-01-03 NOTE — Telephone Encounter (Signed)
Pt calling for a work excuse to be faxed [phone] att darlene vergara

## 2011-01-03 NOTE — Telephone Encounter (Signed)
Letter has been completed by Dr.Deak and faxed as requested to Fax# [phone] Att: Sherrell Puller

## 2011-01-03 NOTE — Telephone Encounter (Signed)
Dr deak   Please f/u

## 2011-01-10 ENCOUNTER — Telehealth (INDEPENDENT_AMBULATORY_CARE_PROVIDER_SITE_OTHER): Payer: Self-pay | Admitting: Obstetrics & Gynecology

## 2011-01-10 NOTE — Telephone Encounter (Signed)
Spoke to pt       Recently had    Hysteroscopy    Ablation    (deak)      Calls with   Fever   After taking motrin       Temp is 99      Explained to pt    99  Is not a temp    Motrin is for fever reduction      Pt also takes    Vitamin with iron     Also  Explained motrin   Does not cause  Black stools     But iron does       Can stop  Vitamin with iron      Also stop  Motrin     Switch  To   Extra  Strength  Tylenol   (follow dosing directions)      Precautions given

## 2011-01-10 NOTE — Telephone Encounter (Signed)
Please call patient she had surgery 12/28/10 patient states every time she takes her medication she has a fever.

## 2011-01-16 ENCOUNTER — Encounter (INDEPENDENT_AMBULATORY_CARE_PROVIDER_SITE_OTHER): Payer: Self-pay | Admitting: Obstetrics & Gynecology

## 2011-01-16 ENCOUNTER — Encounter (INDEPENDENT_AMBULATORY_CARE_PROVIDER_SITE_OTHER): Payer: Self-pay | Admitting: Internal Medicine

## 2011-01-16 NOTE — Telephone Encounter (Signed)
From: [name]   To: Patric Dykes, MD   Sent: Wed Jan 16, 2011 12:09 PM   Subject: 2-Procedural Question    Hi Dr. Josem Kaufmann,    Please enter a request for a diagnostic mammogram and a breast ultrasound into the system for me. I talked to Radiology scheduling today to make an appt for my annual mammogram, and when I told them that I recently noticed a bump under my right arm, they asked for the extra test, which needs your authorization.    Thank you!  Brittany [name]

## 2011-01-16 NOTE — Telephone Encounter (Signed)
Forwarded to Dr  Pattricia Boss.

## 2011-01-16 NOTE — Telephone Encounter (Signed)
Orders have been signed.

## 2011-01-17 NOTE — Telephone Encounter (Signed)
Addended by: Sheilah Mins on: 01/17/2011 03:12 PM     Modules accepted: Orders

## 2011-01-17 NOTE — Telephone Encounter (Signed)
Message sent to patient

## 2011-01-17 NOTE — Telephone Encounter (Signed)
From: [name]   To: Rogelio Seen., MD   Sent: Wed Jan 16, 2011 9:03 PM   Subject: 2-Procedural Question    Hi Dr. Josem Kaufmann,    Please enter a request for a diagnostic mammogram and a breast ultrasound into the system for me. I talked to Radiology scheduling today to make an appt for my annual mammogram, and when I told them that I recently noticed a bump under my right arm, they asked for the extra test, which needs your authorization.    Thank you!  [name]

## 2011-01-24 ENCOUNTER — Ambulatory Visit (INDEPENDENT_AMBULATORY_CARE_PROVIDER_SITE_OTHER): Admitting: Obstetrics & Gynecology

## 2011-01-24 VITALS — BP 113/77 | HR 91 | Temp 97.8°F | Ht 70.0 in | Wt 178.0 lb

## 2011-01-24 NOTE — Progress Notes (Signed)
45 yo female s/p hysteroscopy/removal of submucosal fibroid, novasure endometrial ablation, here for post op check.  Doing well.  Light bleeding.  On menses now.     Path:  Benign fibroid    A/p  Doing well post op  Will dc ocp as on previously on for cycle control  rtc for routine annual exam.  rtc sooner prn

## 2011-02-21 ENCOUNTER — Other Ambulatory Visit (INDEPENDENT_AMBULATORY_CARE_PROVIDER_SITE_OTHER): Payer: Self-pay | Admitting: Internal Medicine

## 2011-02-22 ENCOUNTER — Ambulatory Visit (HOSPITAL_BASED_OUTPATIENT_CLINIC_OR_DEPARTMENT_OTHER): Admitting: Dermatopathology

## 2011-02-22 ENCOUNTER — Encounter (HOSPITAL_BASED_OUTPATIENT_CLINIC_OR_DEPARTMENT_OTHER): Payer: Self-pay | Admitting: Dermatopathology

## 2011-02-22 NOTE — Interdisciplinary (Signed)
Discharge paperwork approved by RN, MA reviewed and reinforced discharge instructions with the patient   Patient Education  Identified learning needs: Diagnosis, Care Plan, Treatment and Community Resources & Follow-up Care  Learner: Patient  Barriers to learning: No Barriers  Readiness to learn: Acceptance  Method: Explanation and Handout  Treatment education given: No  Fall prevention education given: No  Pain education given: No  Response: Verbalizes understanding\

## 2011-02-22 NOTE — Progress Notes (Signed)
Primary MD: [name] Jan    HISTORY:  Brittany [name] is a 45 year old female presents for follow up of bump on the right leg. Present six months.  Not changing.  Not bleeding itching or painful. Has a similar spot on the right anterior lower leg.  Tries to be good about sun protection and avoidance.  No other lesions of concern    She has no personal history of skin cancer.  Mother had non-melanoma skin cancer.    Family history: no significant change from previous visit.    Medications:  Reviewed at this visit.    The patient is allergic to vicodin.    REVIEW OF SYSTEMS:  CONSTITUTIONAL: Negative for fever  GASTROINTESTINAL: No nausea or diarrhea.  Feels well, no other skin complaints.    PHYSICAL EXAM:  Skin Type:                    2  General Appearance:   Normal  Neuro/psych:                Alert and oriented x 3  Cutaneous exam The eyes, head, scalp, oral mucosa, neck, chest, back, abdomen, left arm, right arm, buttock, left leg, right leg, digits/nails were examined and were normal except:    --brown-white papulonodule on the right anterior lower leg and right medial lower leg, consistent with dermatofibromas  --scattered light brown even bordered, even-pigmented macules on the trunk  --mild solar damage      ASSESSMENT AND TREATMENT PLAN:  1. New bump on right lower leg  --clinical exam consistent with dermatofibroma; educated patient on benign nature of growth  --discussed options of biopsy vs observation; she opted for biopsy  2. Scattered banal appearing nevi  --educated patient on ABCDEs of concerning nevi, self skin exams and encouraged to return with any new or concerning lesions  3. Sun damaged skin  --educated patient on sun protection with sunscreen spf 30 or greater, avoidance of peak sun hours, seeking shade and wearing sun protective clothing    - Diagnoses, natural course, treatments and risks were discussed with the patient.  - Follow up 1 year    Baird Lyons A. Mikle Bosworth, MD, PhD  Assistant  Professor of Medicine, Dermatology

## 2011-02-22 NOTE — Interdisciplinary (Signed)
02/22/11- AVS given

## 2011-02-25 ENCOUNTER — Encounter (INDEPENDENT_AMBULATORY_CARE_PROVIDER_SITE_OTHER): Payer: Self-pay | Admitting: Internal Medicine

## 2011-02-25 NOTE — Telephone Encounter (Signed)
FYI to Dr Farber.

## 2011-02-25 NOTE — Telephone Encounter (Signed)
From: [name]   To: Patric Dykes, MD   Sent: Mon Feb 25, 2011 8:20 AM   Subject: 2-Procedural Question    Hello Dr. Pattricia Boss,    I would like to get the influenza vaccine when I see you Oct 24 for my follow-up appointment.    Thank you,  [name]

## 2011-03-13 ENCOUNTER — Ambulatory Visit (INDEPENDENT_AMBULATORY_CARE_PROVIDER_SITE_OTHER): Payer: BC Managed Care – HMO | Admitting: Internal Medicine

## 2011-03-13 ENCOUNTER — Encounter (INDEPENDENT_AMBULATORY_CARE_PROVIDER_SITE_OTHER): Payer: Self-pay | Admitting: Internal Medicine

## 2011-03-13 VITALS — BP 110/74 | HR 88 | Temp 97.8°F | Resp 22 | Wt 174.0 lb

## 2011-03-13 DIAGNOSIS — R109 Unspecified abdominal pain: Secondary | ICD-10-CM | POA: Insufficient documentation

## 2011-03-13 MED ORDER — OMEPRAZOLE 20 MG OR TBEC
20.0000 mg | DELAYED_RELEASE_TABLET | Freq: Every day | ORAL | Status: DC
Start: 2011-03-13 — End: 2015-05-02

## 2011-03-13 NOTE — Interdisciplinary (Signed)
Influenza vaccine given to patient without complication.  VIS and AVS instructions given to patient  post clinic visit with good understanding.

## 2011-03-13 NOTE — Progress Notes (Signed)
The patient returns for followup of her hypothyroidism, low back pain, insomnia, and GERD.  She states that over the last 2 weeks she has had a weekly episode of epigastric and upper abdominal pain which is steady and then resolves after several hours. She denies any constipation or diarrhea had no nausea or vomiting. She has not taken anything for reflux and does note occasional heartburn. She notes that her fatigue and other symptoms of hyperthyroidism have resolved on the increased dose of Synthroid and she is doing well from that regard. She is not having any low back pain, but does note some pain in the right knee while walking and exercising. She denies any symptoms from her insomnia and is not taking Ambien.    Past Medical History   Diagnosis Date   . Infectious mononucleosis 08/2006   . Hypothyroidism      h/o hyperthyroidism s/p radioactive iodine   . Insomnia    . Menorrhagia        Current Outpatient Prescriptions   Medication Sig   . omeprazole (PRILOSEC OTC) 20 MG EC tablet Take 20 mg by mouth daily.   Marland Kitchen levothyroxine (SYNTHROID) 125 MCG tablet Take 1 tablet by mouth daily.   . cyclobenzaprine (FLEXERIL) 5 MG tablet Take 1 tablet by mouth 3 times daily as needed for Muscle Spasms.   Marland Kitchen ibuprofen (MOTRIN) 800 MG tablet Take 1 tablet by mouth every 8 hours as needed for Mild Pain (Pain Score 1-3) or Moderate Pain (Pain Score 4-6).   Marland Kitchen norethindrone-ethinyl estradiol (MICROGESTIN FE 1/20) 1-20 MG-MCG per tablet Take 1 tablet by mouth daily.   Marland Kitchen zolpidem (AMBIEN) 10 MG tablet Take 1 Tab by mouth nightly as needed for Insomnia.   Marland Kitchen cetirizine (ZYRTEC) 10 MG tablet Take 10 mg by mouth daily.   Marland Kitchen DISCONTD: ibuprofen (MOTRIN) 600 MG tablet Take 1 tablet by mouth every 6 hours as needed for Mild Pain (Pain Score 1-3).       Allergies   Allergen Reactions   . Vicodin (Hydrocodone-Acetaminophen) Hallucinations       ROS--    No symptoms of hypo or hyperthyroidism: no decreased or increased weight, no feeling  cold/chilly or excessively warm, no diarrhea or constipation, no undue sweatiness, anxiety or palpitations.    The patient denies cough, chest pain, dyspnea, wheezing or hemoptysis.    Patient denies any exertional chest pain, dyspnea, palpitations, syncope, orthopnea, edema or paroxysmal nocturnal dyspnea.    The patient denies flank pain, anorexia, nausea or vomiting, dysphagia, change in bowel habits or black or bloody stools or weight loss.    The patient denies swelling, numbness, tingling or weakness in the extremities.    PE--    She appears well, in no apparent distress.  Alert and oriented times three, pleasant and cooperative. Vital signs are as noted by the nurse.    Filed Vitals:    03/13/11 1344   BP: 110/74   Pulse: 88   Temp: 97.8 F (36.6 C)   TempSrc: Oral   Resp: 22   Weight: 78.926 kg (174 lb)       Thyroid not palpable, not enlarged, no nodules detected.    Chest is clear, no wheezing or rales. Normal symmetric air entry throughout both lung fields. No chest wall deformities or tenderness.    S1 and S2 normal, no murmurs, clicks, gallops or rubs. Regular rate and rhythm. Chest is clear; no wheezes or rales. No edema or JVD.  The abdomen is soft with mild epigastric tenderness, without guarding, mass, rebound or organomegaly. Bowel sounds are normal. No CVA tenderness or inguinal adenopathy noted.    Extremities: extremities, peripheral pulses and reflexes normal, no edema, redness or tenderness in the calves or thighs, feet normal, good pulses, normal color, temperature and sensation.    Knee exam - right with full range of motion, no pain on motion, no effusion, masses, ligamentous instability or deformity noted. There is mild lateral joint line tenderness but with negative McMurray sign.    Results for orders placed in visit on 02/21/11   TSH, BLOOD       Component Value Range    TSH 2.96  0.27 - 4.20 uIU/mL   FREE THYROXINE, BLOOD       Component Value Range    Free T4 1.43  0.93 - 1.70  ng/dL     A/P--    1. Abdominal pain (789.00)--possibly secondary to her GERD. Since she has only had 2 episodes, each lasting only a few hours and with intermittent asymptomatic periods, doubt significant abdominal pathology. However will check H. pylori antibody and CBC. If her symptoms persist will refer her for ultrasonography to rule out cholelithiasis as well as EGD. In the meantime she will restart low-dose over-the-counter Prilosec.  HEMOGRAM, BLOOD, HELICOBACTER PYLORI ANTIBODY-ELISA, BLOOD, omeprazole (PRILOSEC OTC) 20 MG EC tablet   2. Gastroesophageal reflux disease (530.81)--we'll treat and evaluate as above.  HEMOGRAM, BLOOD, HELICOBACTER PYLORI ANTIBODY-ELISA, BLOOD, omeprazole (PRILOSEC OTC) 20 MG EC tablet   3. Acquired hypothyroidism (244.9)--symptoms have improved. She is now clinically and chemically euthyroid and therefore we'll continue her current dose of levothyroxine.     4. Insomnia (780.52)--will follow.     5. Right knee pain (719.46)--she may have a minor lateral meniscus tear. Will refer for physical therapy.  CONSULT/REFERRAL TO PHYSICAL THERAPY-INTERNAL ([place])

## 2011-03-13 NOTE — Progress Notes (Signed)
Addended by: York Cerise on: 03/13/2011 02:28 PM     Modules accepted: Orders

## 2011-03-25 ENCOUNTER — Encounter (INDEPENDENT_AMBULATORY_CARE_PROVIDER_SITE_OTHER): Payer: Self-pay | Admitting: Obstetrics & Gynecology

## 2011-03-25 NOTE — Telephone Encounter (Signed)
From: [name]   To: Rogelio Seen., MD   Sent: Mon Mar 25, 2011 9:55 AM   Subject: 20-Other    Hello Dr. Josem Kaufmann,    I have a wonderful brochure from a female gay couple who are looking to adopt a child through the [facility]. Is there an appropriate clinic or office within the [facility] where I could leave a few of their brochures (information for the prospective birth parent)?    Thank you,  [name]

## 2011-04-03 ENCOUNTER — Ambulatory Visit (HOSPITAL_BASED_OUTPATIENT_CLINIC_OR_DEPARTMENT_OTHER): Payer: BC Managed Care – HMO

## 2011-04-03 NOTE — Interdisciplinary (Signed)
CLINIC: Physical Therapy      REPORT TYPE: EVAL      Dictating Practitioner: Moody Bruins, P.T.      DATE OF SERVICE: 04/03/2011    REASON FOR VISIT: R knee pain      Therapy Start Time: 03:15 P  Therapy End Time: 04:15 P  Location: [facility]    Diagnosis: R knee pain  ICD 9 Code: 719.46  Start of Care: 04/03/2011  Referring Physician: Skeet Simmer. Pattricia Boss, M.D.    Referring Service: Internal Medicine    Reason for Referral: evaluation and treat    Preferred Language: English   Patient's Age: 45    Patient's Gender: F    Date of Onset (this episode) : 03/02/2010    OBJECTIVE:  Mechanism of Injury: other    Hx of Current Condition:  45yo F pain in the right knee while walking and  exercising, states it started out > 31yr ago for unknown reason.  The knee  is especially painful when she comes down stairs, or wallk down hill.  Pt  currently U to come down stairs reciprically due to pain    Medical history reviewed: Yes - significant medical history:  Depression and Previous surgeries don't take pain med unless    Precautions/Contraindications: none    Pain Assessment: 2/10      Pain at its Worst:  0/10    Frequency of Pain: constant, esp with loading weight or squat    Pain Location:  R knee    Pain Descriptor(s):  dull ache to lateral knee    Pain is Worse With:  as the day goes on, bending, stairs and walking    Pain is Better With: at rest    Diagnostic Tests:  none        Results of Diagnostic Tests:    Medications Affecting Therapy:    Social History:  unknown    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, multimedia    Prior Level of Function:  no functional limitations    OBSERVATION:     tall, long limbs, large Q angle  ambulates w/o AD    difficulty performinng lunge  U to come down stairs wo chaging her legs    able to sit and stand  able to stand single leg >5 sec  able to stand on heels and toes        LOWER BODY:  RIGHT KNEE EVALUATION:          AROM (deg)     Pain      PROM(deg)    Pain     Strength         Pain  Flex: 130                     130                         3/5    LEFT KNEE EVALUATION:          AROM (deg)    Pain      PROM(deg)    Pain     Strength         Pain  Flex: 130                     130  4/5          RIGHT HIP EVALUATION:          AROM (deg)       Pain         PROM(deg)  Pain          Strength Pain  Flex: 120                                                      3/5        Neurologic Exam:          Myotomal Lower Extremity Strength                                Right     Left  L2 Hip Flexor                4/5       5/5  L3 Quad                      3/5       5/5  L4 Ant Tib                   4/5       5/5  L5 EHL                       4/5       5/5  S1 Gastroc/Peroneals         5/5       5/5          ASSESSMENT:  45yo F with somewhat chronic R knee pain from unknown  etiology,  pt has very lax knee joint and lack of strength from surrounding  knee muscles.  Pt will benefit from PT to strengthen VMO, also stretch her  tight IT to avoid further imbalance      Problems/Impairments:  impaired function, decreased ROM, decreased  strength, pain and poor body mechanics    TREATMENT PLAN:  Goals:          ST:  Independent with HEP 4 visits        ST:  Independent with proper body mechanics 4 visits        LT:  Able to climb up/down stairs reciprocally for community mobility  6 visits        LT:  Return to unlimited ambulation without pain 6 visits          Interventions: Functional Activities, Gait Training, HEP Instruction,  Manual Therapy and Therapeutic Exercise      Treatment:  HEP for stretch, questions and answers      Rx Frequency:  1x/week   Rx Duration:  6 weeks    Patient/Family Teaching:  completed this visit    Patient's Goal:  PLOF    Rehab Potential:  good    Eval, rx plan, & goals discussed & agreed upon with:  patient    Response to Therapy:  excellent                   ??      Electronically signed by:  Moody Bruins,  P.T. 04/08/2011 05:40 P  DD: 04/03/2011    DT:  04/03/2011 03:13 P  DocNo.:  [id number]  TA/              Referring Physician:        [name] MD        [phone] Alene Mires [facility]        [facility]        [place] [address]              Primary Care Physician:        [name] MD        [address] [place], [place] [address]      cc:

## 2011-04-16 ENCOUNTER — Ambulatory Visit (HOSPITAL_BASED_OUTPATIENT_CLINIC_OR_DEPARTMENT_OTHER): Payer: BC Managed Care – HMO

## 2011-04-16 NOTE — Interdisciplinary (Signed)
CLINIC:Phys Therapy    REPORT TYPE:TX NOTE    Dictating Practitioner: Moody Bruins, P.T.      DATE OF SERVICE: 04/16/2011    REASON FOR VISIT:Follow-Up Visit    Time Treatment Started: 02:45 P  Time Treatment Ended: 03:15 P    Subjective:  pt did not do too many exercies duing the thanksgiving, feeling some  stiffness to R knee lateral side    Pain: 3    Objective:      Therapeutic Exercises: 45 min    stationary bike (middle) L 3 for 7 min    TRX small lunges 2 set x 10 ea side    single leg stance hold on RLE  single stance while LLE is challeged w/ hip flexion, knee flex    power tower squat 3 set x 10 double  single push 3 set x 10   24 dgs    stretch calves  stretch HS, IT  stretch low back in standing    squat 10 reps w/ small tubing  pull        Electrical Stimulation: 15 min    VMO strengthening w/ machine in supine      Assessment:  pt able to tolerate above exercies, felt pretty good after the session    Plan:  continue with current plan of care                      Electronically signed by:  Moody Bruins, P.T. 04/16/2011 03:35 P        DD: 04/16/2011    DT:  04/16/2011 02:55 P  DocNo.:  [id number]  TA/        Referring Physician:  [name] MD  [phone] Alene Mires [facility]  [facility]  [place] [address]        Primary Care Physician:  [name] MD  [address] [place], [place] [address]      cc:

## 2011-04-23 ENCOUNTER — Ambulatory Visit (HOSPITAL_BASED_OUTPATIENT_CLINIC_OR_DEPARTMENT_OTHER): Payer: BC Managed Care – HMO

## 2011-04-30 ENCOUNTER — Encounter (INDEPENDENT_AMBULATORY_CARE_PROVIDER_SITE_OTHER): Payer: Self-pay | Admitting: Internal Medicine

## 2011-04-30 ENCOUNTER — Ambulatory Visit (HOSPITAL_BASED_OUTPATIENT_CLINIC_OR_DEPARTMENT_OTHER): Payer: BC Managed Care – HMO

## 2011-04-30 ENCOUNTER — Ambulatory Visit (HOSPITAL_BASED_OUTPATIENT_CLINIC_OR_DEPARTMENT_OTHER): Payer: Self-pay

## 2011-04-30 NOTE — Interdisciplinary (Signed)
CLINIC: Phys Therapy      REPORT TYPE: TX NOTE      Dictating Practitioner: Moody Bruins, P.T.      DATE OF SERVICE: 04/30/2011    REASON FOR VISIT: Follow-Up Visit    Time Treatment Started: 01:00 P  Time Treatment Ended: 01:45 P    Subjective:  pt states no change, no benefit from tape to R knee    takes mortrin as needed    Pain: 3    Objective:  no significant change since last visit, lax to R knee, very unstable when  palpate her knee    Therapeutic Exercises:    stationary bike (middle) L 3 for 7 min  TRX small lunges 2 set x 10 ea side  single leg stance hold on RLE  single stance while LLE is challeged w/ hip flexion, knee flex  power tower squat 3 set x 10 double  single push 3 set x 10 24 dgs  stretch calves  stretch HS, IT  stretch low back in standing  squat 10 reps w/ small tubing pull  Electrical Stimulation: 15 min  VMO strengthening w/ machine in supine      Assessment:  pt tolerated exercies well    Plan:  continue with current plan of care                     ??      Electronically signed by:  Moody Bruins, P.T. 05/01/2011 04:41 P        DD: 04/30/2011    DT:  04/30/2011 01:04 P  DocNo.:  [id number]  TA/        Referring Physician:  [name] MD  [phone] Alene Mires [facility]  [name]  [place] [id number]        Primary Care Physician:  [name] MD  [address] [place], [place] [address]      cc:

## 2011-04-30 NOTE — Telephone Encounter (Signed)
Forwarded to Dr  Farber.

## 2011-04-30 NOTE — Telephone Encounter (Signed)
From: [name]   To: Patric Dykes, MD   Sent: Tue Apr 30, 2011 2:39 PM   Subject: 2-Procedural Question    Hello Dr. Pattricia Boss,    I would like to request an MRI of my right knee. I have been to 4 PT appointments with no improvement. I have 2 more appointments through the end of December. Please let me know if the physical therapist needs to file a report first before I can request the MRI. She thinks the MRI is a good idea.    Happy Holidays!  Brittany [name]

## 2011-04-30 NOTE — Interdisciplinary (Signed)
CLINIC: Phys Therapy      REPORT TYPE: TX NOTE      Dictating Practitioner: Moody Bruins, P.T.      DATE OF SERVICE: 04/23/2011    REASON FOR VISIT: Follow-Up Visit    Time Treatment Started: 02:30 P  Time Treatment Ended: 03:15 P    Subjective:  pt states she is doing OK w/ knee    Pain: 0    Objective: no significant change since last time, lax knee on R side      Therapeutic Exercises:    power tower 33 dgs single leg squat 2 set x 10    single leg squat 3 set x 10  HS stretch  Heel cord stretch  IT stretch    stair training back and forth w/ 2 inch tall step  stair training back and forth using 4 inch tall step    VMO strengthening using Russian stim supine knee extension 5 min    Assessment:  pt feeling pretty good after tx    Plan:  continue with current plan of care                     ??      Electronically signed by:  Moody Bruins, P.T. 05/01/2011 04:41 P        DD: 04/23/2011    DT:  04/23/2011 02:58 P  DocNo.:  [id number]  TA/        Referring Physician:  [name] MD  [phone] Alene Mires [facility]  [facility]  [place] [address]        Primary Care Physician:  [name] MD  [address] [place], [place] [address]      cc:

## 2011-05-06 ENCOUNTER — Encounter (INDEPENDENT_AMBULATORY_CARE_PROVIDER_SITE_OTHER): Payer: Self-pay | Admitting: Internal Medicine

## 2011-05-07 ENCOUNTER — Ambulatory Visit (HOSPITAL_BASED_OUTPATIENT_CLINIC_OR_DEPARTMENT_OTHER): Payer: BC Managed Care – HMO

## 2011-05-07 ENCOUNTER — Ambulatory Visit (HOSPITAL_BASED_OUTPATIENT_CLINIC_OR_DEPARTMENT_OTHER): Payer: Self-pay

## 2011-05-07 NOTE — Telephone Encounter (Signed)
Telephone number given/sent thru My Chart.

## 2011-05-07 NOTE — Telephone Encounter (Signed)
From: Brittany [name]   To: Patric Dykes, MD   Sent: Mon May 06, 2011 11:52 AM   Subject: 2-Procedural Question    Thank you for ordering the MRI Dr. Pearlean Brownie.     Lanora Manis, what number do I call to make the appointment? Can you add the MRI request to MyChart?    Thank you!  Brittany [name]  work [phone] (Ext. [phone])    ----- Message -----   From: Patric Dykes, MD   Sent: 05/01/2011 5:00 AM PST   To: Brittany [name]  Subject: RE: 2-Procedural Question     I ordered the MRI for you.    ----- Message -----   From: Brittany [name]   Sent: 04/30/2011 2:39 PM PST   To: Patric Dykes, MD  Subject: 2-Procedural Question    Hello Dr. Pattricia Boss,    I would like to request an MRI of my right knee. I have been to 4 PT appointments with no improvement. I have 2 more appointments through the end of December. Please let me know if the physical therapist needs to file a report first before I can request the MRI. She thinks the MRI is a good idea.    Happy Holidays!  Brittany [name]

## 2011-05-07 NOTE — Interdisciplinary (Signed)
CLINIC:Phys Therapy    REPORT TYPE:TX NOTE    Dictating Practitioner: Moody Bruins, P.T.      DATE OF SERVICE: 05/07/2011    REASON FOR VISIT:Follow-Up Visit    Time Treatment Started: 02:00 P  Time Treatment Ended: 02:45 P    Subjective:  pt states she asekd MD for MRI    Pain: 4    Objective:  full ROM of R knee,  discomfort to lateral knee upon coming down stairs    Therapeutic Exercises: 45 min    stationary bike for 10 min L 3  seat #11    up climing using 7 inch step  down stair using 7 inch step, U to swing through    power tower squat  3 set x 10 at 30dgs    R knee weakness w/ minor discomort        VMO strengthening using machine 10 min        Assessment:  continues to see R knee laxity, suggested pt to wear brace.    Plan:  pt will have one more sessions of PT and will be on HOLD until she will get  her MRI done                      Electronically signed by:  Moody Bruins, P.T. 05/07/2011 05:34 P      DD: 05/07/2011    DT:  05/07/2011 02:05 P  DocNo.:  [id number]  TA/        Referring Physician:  [name] MD  [phone] Alene Mires [facility]  [facility]  [place] [phone]        Primary Care Physician:  [name] MD  [address] [place], [place] [address]      cc:

## 2011-05-10 ENCOUNTER — Encounter (INDEPENDENT_AMBULATORY_CARE_PROVIDER_SITE_OTHER): Payer: Self-pay | Admitting: Obstetrics & Gynecology

## 2011-05-10 NOTE — Telephone Encounter (Signed)
From: [name]   To: Rogelio Seen., MD   Sent: Caleen Essex May 10, 2011 8:22 AM   Subject: 2-Procedural Question    Hello Dr. Josem Kaufmann,    It has been 4 months since the endometrial ablation. I stopped taking the pill 2 weeks ago because it gave me a stomach ache. However, I have my period now and it is pretty heavy, with some clotting. Would you recommend a new prescription for a lower dose pill? Or should I wait a month or two?    Thank you for your help. Merry Christmas!  Brittany [name]

## 2011-05-16 ENCOUNTER — Encounter (HOSPITAL_BASED_OUTPATIENT_CLINIC_OR_DEPARTMENT_OTHER): Payer: BC Managed Care – HMO

## 2011-05-16 ENCOUNTER — Ambulatory Visit (HOSPITAL_BASED_OUTPATIENT_CLINIC_OR_DEPARTMENT_OTHER): Payer: Self-pay

## 2011-05-16 NOTE — Interdisciplinary (Signed)
CLINIC:Rehabilitation Services    REPORT TYPE:TX NOTE    Dictating Practitioner: Moody Bruins, P.T.      DATE OF SERVICE: 05/16/2011    REASON FOR VISIT:Cancellation    The patient cancelled the scheduled clinic appointment today.                      Electronically signed by:  Moody Bruins, P.T. 05/16/2011 02:39 P        DD: 05/16/2011    DT:  05/16/2011 02:39 P  DocNo.:  [id number]  TA/        Referring Physician:  [name] MD  [phone] Alene Mires [facility]  [facility]  [place] [address]        Primary Care Physician:  [name] MD  [address] [place], [place] [address]      cc:

## 2011-05-23 ENCOUNTER — Ambulatory Visit (HOSPITAL_BASED_OUTPATIENT_CLINIC_OR_DEPARTMENT_OTHER): Payer: BC Managed Care – HMO

## 2011-05-23 NOTE — Interdisciplinary (Signed)
CLINIC:Phys Ther    REPORT TYPE:DC EVAL    Dictating Practitioner: Moody Bruins, P.T.      DATE OF SERVICE: 05/23/2011    REASON FOR VISIT:Discharge Evaluation    Discipline: Physical Therapy    Location: [facility]    Date Tx Started:04/03/2011    Discharge Date: 05/23/2011    Total visits: 6    Treatment Time:  01:00 P to 01:45 P    Pain Score: 3    Subjective Comment: pt states doing well except stairs    Objective Findings:pt found lateral meniscal tear w/ recent MRI,  will  discuss further care with MD,  suggestion given by therapist w/ completion  of ex, which position to avoid or not do.    Treatment: ther ex, suggestion w/ exercies, position to avoid    Patient's Response To Treatment: Good    Goal Comment: Partially Met  pt still has pain to lateral knee area, will go back to MD for furhter  follow up    Destination: Home and fu by ortho    Follow Up Care: Other    Additional Notes:      Home Exercise Program: Pt instructed in and demos I with HEP            Electronically signed by:  Moody Bruins, P.T. 05/23/2011 03:11 P      DD: 05/23/2011    DT:  05/23/2011 01:10 P  DocNo.:  [id number]  TA/        Referring Physician:  [name] MD  [phone] Alene Mires [facility]  [facility]  [place] [id number]        Primary Care Physician:  [name] MD  [address] [place], [place] [address]      cc:

## 2011-06-19 ENCOUNTER — Encounter (INDEPENDENT_AMBULATORY_CARE_PROVIDER_SITE_OTHER): Payer: Self-pay | Admitting: Internal Medicine

## 2011-06-20 ENCOUNTER — Encounter (INDEPENDENT_AMBULATORY_CARE_PROVIDER_SITE_OTHER): Payer: Self-pay | Admitting: Internal Medicine

## 2011-06-20 ENCOUNTER — Ambulatory Visit (INDEPENDENT_AMBULATORY_CARE_PROVIDER_SITE_OTHER): Payer: BC Managed Care – HMO | Admitting: Internal Medicine

## 2011-06-27 ENCOUNTER — Telehealth (HOSPITAL_BASED_OUTPATIENT_CLINIC_OR_DEPARTMENT_OTHER): Payer: Self-pay

## 2011-07-15 ENCOUNTER — Ambulatory Visit (HOSPITAL_BASED_OUTPATIENT_CLINIC_OR_DEPARTMENT_OTHER): Payer: BC Managed Care – HMO | Admitting: Internal Medicine

## 2011-07-15 ENCOUNTER — Encounter (HOSPITAL_BASED_OUTPATIENT_CLINIC_OR_DEPARTMENT_OTHER): Payer: Self-pay | Admitting: Internal Medicine

## 2011-07-15 VITALS — BP 124/68 | HR 83 | Temp 98.0°F | Resp 16 | Ht 70.0 in | Wt 173.9 lb

## 2011-07-26 ENCOUNTER — Encounter (INDEPENDENT_AMBULATORY_CARE_PROVIDER_SITE_OTHER): Payer: Self-pay

## 2011-07-31 ENCOUNTER — Ambulatory Visit
Admit: 2011-07-31 | Discharge: 2011-08-03 | Payer: Self-pay | Source: Ambulatory Visit | Attending: Internal Medicine | Admitting: Internal Medicine

## 2011-08-05 ENCOUNTER — Encounter (INDEPENDENT_AMBULATORY_CARE_PROVIDER_SITE_OTHER): Payer: Self-pay | Admitting: Internal Medicine

## 2011-08-14 ENCOUNTER — Encounter (HOSPITAL_BASED_OUTPATIENT_CLINIC_OR_DEPARTMENT_OTHER): Payer: Self-pay | Admitting: Internal Medicine

## 2011-08-15 NOTE — Telephone Encounter (Signed)
From: [name]  To: Leanord Hawking, MD  Sent: 08/14/2011 7:44 PM PDT  Subject: 2-Procedural Question    Hello Dr. Allena Katz,    I had an upper endoscopy on Wed. March 13 and thought I would have received the biopsy results by now. Just checking in. Thank you for your help.    Brittany [name]

## 2011-08-17 ENCOUNTER — Telehealth (HOSPITAL_BASED_OUTPATIENT_CLINIC_OR_DEPARTMENT_OTHER): Payer: Self-pay

## 2011-08-17 NOTE — Telephone Encounter (Signed)
Results reviewed.  Letter deferred.  Will be seen in clinic on 09/17/2011.

## 2011-08-23 ENCOUNTER — Other Ambulatory Visit (INDEPENDENT_AMBULATORY_CARE_PROVIDER_SITE_OTHER): Payer: Self-pay | Admitting: Internal Medicine

## 2011-09-10 ENCOUNTER — Encounter (HOSPITAL_BASED_OUTPATIENT_CLINIC_OR_DEPARTMENT_OTHER): Payer: Self-pay | Admitting: Internal Medicine

## 2011-09-10 NOTE — Telephone Encounter (Signed)
From: Brittany [name]  To: Brittany Hawking, MD  Sent: 09/10/2011 10:21 AM PDT  Subject: 2-Procedural Question    Dear Dr. Allena Katz,    I had an upper endoscopy on March 13, but have not received the letter with the test results. I did receive a reply from your nurse in response to my email on March 28. I have a follow-up appointment with you on April 30 and hope that a copy of the letter regarding the findings will be uploaded to MyChart before then.    Thank you!  Brittany [name]    ----- Message -----  From: Lenoria Farrier, RN  Sent: 08/15/2011 9:45 AM PDT  To: Brittany [name]  Subject: RE: 2-Procedural Question    Benign findings. No celiac disease and no infection is seen. A letter will follow.    "FINAL PATHOLOGIC DIAGNOSIS:  A: Duodenum, biopsy   -Duodenal mucosa with no significant histopathology.  B: Stomach, biopsy   -Mild chronic inactive gastritis.   -No evidence of Helicobacter organisms."         ----- Message -----   From: Brittany [name]   Sent: 08/14/2011 7:44 PM PDT   To: Brittany Hawking, MD  Subject: 2-Procedural Question    Hello Dr. Allena Katz,    I had an upper endoscopy on Wed. March 13 and thought I would have received the biopsy results by now. Just checking in. Thank you for your help.    Brittany [name]

## 2011-09-13 ENCOUNTER — Ambulatory Visit (INDEPENDENT_AMBULATORY_CARE_PROVIDER_SITE_OTHER): Payer: BC Managed Care – HMO | Admitting: Audiology

## 2011-09-17 ENCOUNTER — Ambulatory Visit (HOSPITAL_BASED_OUTPATIENT_CLINIC_OR_DEPARTMENT_OTHER): Payer: BC Managed Care – HMO | Admitting: Internal Medicine

## 2011-09-17 VITALS — BP 119/73 | HR 89 | Temp 98.0°F | Resp 16 | Ht 70.0 in | Wt 171.0 lb

## 2011-09-18 ENCOUNTER — Encounter (INDEPENDENT_AMBULATORY_CARE_PROVIDER_SITE_OTHER): Payer: Self-pay | Admitting: Internal Medicine

## 2011-09-18 ENCOUNTER — Ambulatory Visit (INDEPENDENT_AMBULATORY_CARE_PROVIDER_SITE_OTHER): Payer: BC Managed Care – HMO | Admitting: Internal Medicine

## 2011-09-18 VITALS — BP 93/56 | HR 75 | Temp 97.5°F | Resp 20 | Wt 170.0 lb

## 2011-09-18 MED ORDER — LEVOTHYROXINE SODIUM 125 MCG OR TABS
125.0000 ug | ORAL_TABLET | ORAL | Status: DC
Start: 2011-09-18 — End: 2012-09-17

## 2011-09-18 MED ORDER — LEVOTHYROXINE SODIUM 112 MCG OR TABS
112.0000 ug | ORAL_TABLET | ORAL | Status: DC
Start: 2011-09-18 — End: 2012-09-17

## 2011-09-18 MED ORDER — ZOLPIDEM TARTRATE 10 MG OR TABS
10.0000 mg | ORAL_TABLET | Freq: Every evening | ORAL | Status: DC | PRN
Start: 2011-09-18 — End: 2016-09-12

## 2011-09-18 NOTE — Patient Instructions (Signed)
Your lab tests should be done 1 week before your next visit.    On July 19, 2011, the [facility]'s Lab will be changing to a scheduled lab policy. The goal of this change is to decrease wait times at the lab.   How will this affect patients needing labs?    1) If you are at the clinic for a regularly scheduled appointment and labs are ordered, you don't need to do anything different-simply go to the lab, draw a number, and wait to be called.   2) If you are returning for labs and you DO NOT have a scheduled physician appointment, you will need to schedule your labs.   To schedule a lab appointment you can:     a) Stop by the check-out desk after your appointment OR   b) Call the clinic number at [phone] OR   c) Use [place] and schedule electronically     Please note, the [facility] attached to [facility] will still take walk-in patients without an appointment if you require more flexibility. Our ultimate goal here is better patient service, and we thank you for your understanding and patience during this transition.     [facility]

## 2011-09-18 NOTE — Interdisciplinary (Signed)
AVS instructions given to patient  post clinic visit with good understanding.

## 2011-09-18 NOTE — Progress Notes (Signed)
The patient returns for followup of her GERD, abdominal pain, hypothyroidism, right knee pain, and insomnia. Since her last visit with me she had EGD which revealed only mild antral gastritis which was inactive with Helicobacter negative biopsy and otherwise was normal. She states that on gluten-free diet and probiotics her GI symptoms have significantly improved. However she is now noticing some palpitations and feeling warm and is concerned that her thyroid replacement is excessive at the present time. She wishes to decrease it to some degree. She notes that the right knee has significantly improved, and she needs the Ambien only occasionally for her insomnia.    Past Medical History   Diagnosis Date   . Infectious mononucleosis 08/2006   . Hypothyroidism      h/o hyperthyroidism s/p radioactive iodine   . Insomnia    . Menorrhagia        Current Outpatient Prescriptions   Medication Sig   . cetirizine (ZYRTEC) 10 MG tablet Take 10 mg by mouth daily.   . cyclobenzaprine (FLEXERIL) 5 MG tablet Take 1 tablet by mouth 3 times daily as needed for Muscle Spasms.   Marland Kitchen ibuprofen (MOTRIN) 800 MG tablet Take 1 tablet by mouth every 8 hours as needed for Mild Pain (Pain Score 1-3) or Moderate Pain (Pain Score 4-6).   Marland Kitchen levothyroxine (SYNTHROID) 112 MCG tablet Take 1 tablet by mouth every other day.   . levothyroxine (SYNTHROID) 125 MCG tablet Take 1 tablet by mouth every other day.   Marland Kitchen DISCONTD: levothyroxine (SYNTHROID) 125 MCG tablet Take 1 tablet by mouth daily.   Marland Kitchen omeprazole (PRILOSEC OTC) 20 MG EC tablet Take 20 mg by mouth daily.   Marland Kitchen zolpidem (AMBIEN) 10 MG tablet Take 1 tablet by mouth nightly as needed for Insomnia.   Marland Kitchen DISCONTD: zolpidem (AMBIEN) 10 MG tablet Take 1 Tab by mouth nightly as needed for Insomnia.     No current facility-administered medications for this visit.       Allergies   Allergen Reactions   . Vicodin (Hydrocodone-Acetaminophen) Hallucinations       ROS--    No symptoms of hypo or  hyperthyroidism: no decreased or increased weight, no feeling cold/chilly or excessively warm, no diarrhea or constipation, no undue sweatiness, anxiety or palpitations.    The patient denies cough, chest pain, dyspnea, wheezing or hemoptysis.    Patient denies any exertional chest pain, dyspnea, palpitations, syncope, orthopnea, edema or paroxysmal nocturnal dyspnea.    The patient denies abdominal or flank pain, anorexia, nausea or vomiting, dysphagia, change in bowel habits or black or bloody stools or weight loss.    The patient denies swelling, numbness, tingling or weakness in the extremities.    PE--    She appears well, in no apparent distress.  Alert and oriented times three, pleasant and cooperative. Vital signs are as noted by the nurse.    Filed Vitals:    09/18/11 1300   BP: 93/56   Pulse: 75   Temp: 97.5 F (36.4 C)   TempSrc: Oral   Resp: 20   Weight: 77.111 kg (170 lb)   SpO2: 98%       Thyroid not palpable, not enlarged, no nodules detected.    Chest is clear, no wheezing or rales. Normal symmetric air entry throughout both lung fields. No chest wall deformities or tenderness.    S1 and S2 normal, no murmurs, clicks, gallops or rubs. Regular rate and rhythm. Chest is clear; no wheezes or rales. No  edema or JVD.    The abdomen is soft without tenderness, guarding, mass, rebound or organomegaly. Bowel sounds are normal. No CVA tenderness or inguinal adenopathy noted.    Extremities: extremities, peripheral pulses and reflexes normal, no edema, redness or tenderness in the calves or thighs, feet normal, good pulses, normal color, temperature and sensation.    Laboratory reviewed and are normal as below.    Results for orders placed in visit on 08/23/11   TSH, BLOOD       Result Value Range    TSH 0.30  0.27 - 4.20 uIU/mL   FREE THYROXINE, BLOOD       Result Value Range    Free T4 1.49  0.93 - 1.70 ng/dL       A/P--    1. Dyspepsia (536.8)--improved. She can take omeprazole over-the-counter every other  day as needed as per GI.     2. Gluten intolerance (579.0)--continue gluten-free diet.     3. Right knee pain (719.46)--significantly improved.     4. Insomnia (780.52)--she can continue Ambien as needed.  zolpidem (AMBIEN) 10 MG tablet   5. Acquired hypothyroidism (244.9)--although her TSH and free T4 are normal, she is somewhat symptomatic from her excessive thyroid replacement. Wil therefore decrease Synthroid to 125 mcg alternating with 112 mcg and repeat labs in 3 months.  levothyroxine (SYNTHROID) 125 MCG tablet, levothyroxine (SYNTHROID) 112 MCG tablet, TSH, BLOOD, FREE THYROXINE, BLOOD

## 2011-09-20 NOTE — Progress Notes (Signed)
Visit practitioner:  Leanord Hawking    Date / Time: 09/17/2011    Reason for Visit: F/U Dyspepsia, FH celiac disease      History of Present Illness:   This is a 61mo f/u visit for this 46 year old female with hx depression, fibroids, referred for dyspepsi. She states that this has been present for years but has been more severe over past few months. Upon further questioning, Brittany [name] could not really describe quality, location, duration or precipitants of discomfort. It is not associated with n/v or changes in bowel habits (which are regular). She admits to previously having heartburn but this has completely resolved since starting OMP 3mo ago. She believes that the PPI did not impact her dyspeptic sx. Due to her strong FH of celiac disease (sister and mother), she tried a low-gluten diet and felt substantially better.  Since her last visit we performed and EGD, which was unremarkable. Duodenal biopsies showed no evidence of celiac disease. TFTs were WNL. Clinically, she is doing much better and wonders of she can DC her PPI.        Patient Active Problem List    Diagnosis Date Noted   . Gluten intolerance 09/17/2011   . Dyspepsia 07/15/2011   . 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 Abdominal pain 03/13/2011   . Right knee pain 03/13/2011   . Routine lab draw 03/13/2011   . Hearing loss 12/14/2010   . Cerumen impaction 12/14/2010   . Pre-operative examination 12/06/2010     Brittany [name] is a 46 year old female G0P0000 with h/o menorrhagia who presents today for preop visit for hysteroscopy, D&C, possible resection of fibroid, and Novasure ablation.     Work-up:  11/22/10: EMB benign  11/22/10 pap: negative    11/28/10 Pelvic sono:  The uterus shows several intramural fibroids, largest measuring 3.7 x 3.2 cm   containing a subcentimeter cystic degeneration, abutting the endometrium. The  endometrial stripe is 1.8-mm. Both ovaries are normal measuring 2.6 x 1.3 x   1.7 cm on the right and 2.6 x 2.7 x 2.0 cm on  the left. No free fluid is   seen       . Skin lesion 12/05/2010   . Palpitations 10/02/2010   . Low back pain 06/15/2010   . Fatigue 06/15/2010   . Depression 07/25/2009   . Sebaceous cyst 03/23/2009   . Insomnia 11/16/2008   . Acquired hypothyroidism 09/22/2008   . Right foot pain 08/18/2008   . Dysmenorrhea 08/18/2008   . Grave's disease 01/15/2008   . Gastroesophageal reflux disease 01/15/2008   . Vitamin D Deficiency 01/06/2008   . Benign neoplasm of skin, site unspecified 08/27/2007   . Cold sensitivity 08/27/2007   . Allergic Rhinitis 03/23/2007   . Health maintenance examination 02/04/2007     Last pap 2006, has had squamous cells, repeats normal  Mammogram: 2/08, all normal  CHOL 225  HDL65  LDLCALC      143    TRIG 87  (12/15/06)  Cycles: regular q28 days, last two cycles "heavier flow" "more cramping" and 1day longer (5 instead of 4)        . Infectious Mononucleosis 09/11/2006       Past Medical History   Diagnosis Date   . Infectious mononucleosis 08/2006   . Hypothyroidism      h/o hyperthyroidism s/p radioactive iodine   . Insomnia    . Menorrhagia  Past Surgical History   Procedure Laterality Date   . Cholecystectomy, lap  1995   . Left wrist tendon repair  1998   . Remove tonsils/adenoids,<12 y/o  26   . Wisdom teeth     . Excis uterine fibroid,vag apprch         Current Medications:    cetirizine (ZYRTEC) 10 MG tablet Take 10 mg by mouth daily.   cyclobenzaprine (FLEXERIL) 5 MG tablet Take 1 tablet by mouth 3 times daily as needed for Muscle Spasms.   ibuprofen (MOTRIN) 800 MG tablet Take 1 tablet by mouth every 8 hours as needed for Mild Pain (Pain Score 1-3) or Moderate Pain (Pain Score 4-6).   levothyroxine (SYNTHROID) 112 MCG tablet Take 1 tablet by mouth every other day.   levothyroxine (SYNTHROID) 125 MCG tablet Take 1 tablet by mouth every other day.   omeprazole (PRILOSEC OTC) 20 MG EC tablet Take 20 mg by mouth daily.   zolpidem (AMBIEN) 10 MG tablet Take 1 tablet by mouth nightly as  needed for Insomnia.       Allergies:  Allergies   Allergen Reactions   . Vicodin (Hydrocodone-Acetaminophen) Hallucinations       Family History:  Family History   Problem Relation Age of Onset   . Ovarian Cancer Neg Hx      but sister w/ovarian cysts age 10   . Breast Cancer Mother    . Other Sister      Uterine fibroids/hysterectomy       History     Social History   . Marital Status: Married     Spouse Name: N/A     Number of Children: N/A   . Years of Education: N/A     Social History Main Topics   . Smoking status: Never Smoker    . Smokeless tobacco: Never Used   . Alcohol Use: Yes      1 bottle wine /week   . Drug Use: No   . Sexually Active: Not Currently     Other Topics Concern   . Not on file     Social History Narrative   . No narrative on file       Review of Systems:  Weight loss:no  Trouble breathing: no  Chest pain: no  Abdominal pain: yes  Nausea / vomitting: no  Kidney problems: no  Back pain: no  Seizures: no  Diabetes: no  Thyroid problems: no  Skin rash: no    Review of Systems:  Constituitional:  Negative for weight loss, weakness, anorexia, fever or chills  Eyes: Negative for vision changes  Ears/Throat:  Negative for hearing loss or changes, sore throat, or hoarseness  CV:  Negative for palpitations, orthopnea, or chest pain  Respiratory:  Negative for cough, shortness of breath, sputum, or wheezing  Genitourinary:  Negative for dysuria, frequency, urgency, nocturia, or hematuria  GI:  See HPI above  Musculoskeletal:  Negative for arthalgia, joint pain, or back problems  Skin:  Negative for lesions or rash  Neuro:  Negative for syncope, seizure, or dizziness  Psych:  Negative for depression or anxiety  Endocrine:  Negative for diabetes or thyroid problems  Heme/Lymph:  Negative for bruising or anemia      Physical examination:   BP 119/73  Pulse 89  Temp(Src) 98 F (36.7 C) (Oral)  Resp 16  Ht 5\' 10"  (1.778 m)  Wt 77.565 kg (171 lb)  BMI 24.54 kg/m2  SpO2 100% Body mass  index is  24.54 kg/(m^2).  Wt Readings from Last 2 Encounters:   09/18/11 77.111 kg (170 lb)   09/17/11 77.565 kg (171 lb)    Blood Pressure   09/18/11 93/56   09/17/11 119/73      Pain Score: 0  General:  Well nourished, well developed, NCAT, no acute distress, non-toxic  Affect:  Normal  HEENT:  No scleral icterus, no conjunctival pallor, mucus membranes moist, neck supple without lymphadenopathy, no thyroid abnormalities appreciated  Lungs:  Clear to auscultation bilaterally, no wheezing, rales, or rhonchi  Cardiovascular:  Regular rate and rhythm without murmurs, rubs or gallops.  Abdomen:  Soft, bowel sounds present, liver nonpalable, spleen nonpalpable, no ascites, no tenderness, rebound or guarding  Extremities:  No peripheral edema  Skin:  Non-icteric, no palmar erythema, no spider angiomas, and no visible rash  Neuro:  Alert and oriented x 3, no focal deficits, no asterixis        Lab Results  Lab Results   Component Value Date    WBC 6.4 03/13/2011    RBC 4.01 03/13/2011    HGB 13.0 03/13/2011    HCT 36.9 03/13/2011    MCV 92.0 03/13/2011    MCHC 35.2 03/13/2011    RDW 11.9 03/13/2011    PLT 215 03/13/2011    PLT 225 09/21/2008    MPV 12.4 03/13/2011     Lab Results   Component Value Date    BUN 13 06/27/2008    CREAT 0.79 06/27/2008    CL 100 06/27/2008    NA 139 06/27/2008    K 4.0 06/27/2008    [place] 9.5 09/21/2008    TBILI 1.3 03/03/2008    ALB 4.7 03/03/2008    TP 6.5 03/03/2008    AST 23 03/03/2008    ALK 52 03/03/2008    BICARB 28 06/27/2008    ALT 30 03/03/2008    GLU 85 06/27/2008     Lab Results   Component Value Date    AST 23 03/03/2008    ALT 30 03/03/2008    ALK 52 03/03/2008    TP 6.5 03/03/2008    ALB 4.7 03/03/2008    TBILI 1.3 03/03/2008    DBILI 0.2 04/30/2007       Assessment/Plan: 45yo woman with strong FH celiac disease, heartburn (resolved on PPI), referred for recurrent dyspeptic sx. GED was unremarkable, including duodenal biopsies. She likely has a component of gluten sensitivity but not true celiac disease.  She is doing much better. She is still taking daily Prilosec OTC (10mg ) - I have recommended that she try taking this qod for several weeks and if she still feels well, then try DC it completely. She will f/u on prn bases. Counseled 42min/30min appt.    Electronically Signed by:  Leanord Hawking  Associate Clinical Professor of Medicine  Division of Gastroenterology

## 2011-09-23 ENCOUNTER — Ambulatory Visit (INDEPENDENT_AMBULATORY_CARE_PROVIDER_SITE_OTHER): Payer: BC Managed Care – HMO | Admitting: Otolaryngology

## 2011-09-30 ENCOUNTER — Ambulatory Visit (INDEPENDENT_AMBULATORY_CARE_PROVIDER_SITE_OTHER): Payer: BC Managed Care – HMO | Admitting: Otolaryngology

## 2011-09-30 VITALS — BP 113/74 | HR 80

## 2011-09-30 MED ORDER — VALACYCLOVIR HCL 1000 MG OR TABS
1000.0000 mg | ORAL_TABLET | Freq: Two times a day (BID) | ORAL | Status: DC
Start: 2011-09-30 — End: 2011-12-26

## 2011-09-30 NOTE — Progress Notes (Signed)
Intermittent episodes of dizziness. Provoked by looking up. Ongoing lightheadedness. Symptoms for several months. Some increased tinnitus for some months. Audiogram unchanged AD severe hearing loss, AS normal. Occasional pain when sleeping on right side lying on pillow. Some teeth grinding: uses an oral appliance. Tinnitus has increased in intensity.    Headaches: occasional headaches.     O/E   TMs, ECs, AD exostoses, AC>BC Weber C   Cranial nerves, motor, sensory   Hallpike LED, RED horizontal and repeat   Head thrust    Assess:   ? Viral Labyrinthitis    Plan:   Lysine 500 mg per day   Valacyclovir 1000 BID, Repeat if symptoms occur   Vestibular Rehab

## 2011-11-05 ENCOUNTER — Ambulatory Visit (INDEPENDENT_AMBULATORY_CARE_PROVIDER_SITE_OTHER): Payer: Self-pay | Admitting: Otolaryngology

## 2011-11-12 ENCOUNTER — Encounter (INDEPENDENT_AMBULATORY_CARE_PROVIDER_SITE_OTHER): Payer: Self-pay | Admitting: Internal Medicine

## 2011-11-12 NOTE — Telephone Encounter (Signed)
Brittany [name], please notify pt that Thyroid panel are ordered for pt. Thanks Darrick Penna.  Keep encounter open for Dr. Pattricia Boss for advise after results are back. Marland Kitchen

## 2011-11-12 NOTE — Telephone Encounter (Signed)
Forwarded to Dr Pattricia Boss. I have sent message to patient thru My Chart. FYI.

## 2011-11-12 NOTE — Telephone Encounter (Signed)
From: [name]  To: Patric Dykes, MD  Sent: 11/12/2011 1:26 PM PDT  Subject: 2-Procedural Question    Hello Dr. Pattricia Boss,    Please let me know if you would move up the date for my TSH bloodwork (currently 7/15 or later). I have been having heart palpitations (past 2 days), stomach issues (past week), and overall fatigue, and have missed 3 days of work as a result.    Thank you,  [name]

## 2011-11-13 ENCOUNTER — Other Ambulatory Visit (INDEPENDENT_AMBULATORY_CARE_PROVIDER_SITE_OTHER): Payer: Self-pay | Admitting: Internal Medicine

## 2011-11-27 ENCOUNTER — Encounter (INDEPENDENT_AMBULATORY_CARE_PROVIDER_SITE_OTHER): Payer: Self-pay | Admitting: Otolaryngology

## 2011-11-27 ENCOUNTER — Ambulatory Visit (INDEPENDENT_AMBULATORY_CARE_PROVIDER_SITE_OTHER): Payer: BC Managed Care – HMO | Admitting: Otolaryngology

## 2011-11-27 VITALS — BP 115/73 | HR 85 | Temp 98.2°F | Resp 18 | Ht 68.0 in | Wt 170.0 lb

## 2011-11-27 NOTE — Progress Notes (Signed)
Type of visit:  New patient consultation    Chief Complaint:  Brittany [name] is a 46 year old female here for evaluation of hearing loss.    History of Present Illness:  Patient states she noticed tinnitus since 2005, had audio in 2007 which showed diminished hearing AD.  Patient complains of worsening tinnitus, difficulty with conversations in crowd.  Has positional vertigo with looking up toward the sun. Patient denies history of migraine headaches although she does report light sensitivity, does not worsen any other symptoms.    Patient denies prior otologic problems or surgery. Patient has history of exostoses.      Past Medical History:   Past Medical History   Diagnosis Date   . Infectious mononucleosis 08/2006   . Hypothyroidism      h/o hyperthyroidism s/p radioactive iodine   . Insomnia    . Menorrhagia       Past Surgical History:  Past Surgical History   Procedure Laterality Date   . Cholecystectomy, lap  1995   . Left wrist tendon repair  1998   . Remove tonsils/adenoids,<12 y/o  12   . Wisdom teeth     . 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 Yes     1 bottle wine /week   ,   History   Drug Use No   ,  Family History:   Family History   Problem Relation Age of Onset   . Ovarian Cancer Neg Hx      but sister w/ovarian cysts age 25   . Breast Cancer Mother    . Other Sister      Uterine fibroids/hysterectomy     Medications:    Current Outpatient Prescriptions on File Prior to Visit   Medication Sig Dispense Refill   . cetirizine (ZYRTEC) 10 MG tablet Take 10 mg by mouth daily.       . cyclobenzaprine (FLEXERIL) 5 MG tablet Take 1 tablet by mouth 3 times daily as needed for Muscle Spasms.  30 tablet  0   . ibuprofen (MOTRIN) 800 MG tablet Take 1 tablet by mouth every 8 hours as needed for Mild Pain (Pain Score 1-3) or Moderate Pain (Pain Score 4-6).  90 tablet  1   . levothyroxine (SYNTHROID) 112 MCG tablet Take 1 tablet by mouth  every other day.  45 tablet  3   . levothyroxine (SYNTHROID) 125 MCG tablet Take 1 tablet by mouth every other day.  45 tablet  3   . omeprazole (PRILOSEC OTC) 20 MG EC tablet Take 20 mg by mouth daily.  30 tablet  5   . valACYclovir (VALTREX) 1 GM tablet Take 1 tablet by mouth 2 times daily.  20 tablet  2   . zolpidem (AMBIEN) 10 MG tablet Take 1 tablet by mouth nightly as needed for Insomnia.  30 tablet  2     No current facility-administered medications on file prior to visit.      Allergies:    Allergies   Allergen Reactions   . Vicodin (Hydrocodone-Acetaminophen) Hallucinations      Review of System:  A complete 10 point review of system was performed and was found to be negative except as described in HPI.  Physical Exam:    Vitals:  BP 115/73  Pulse 85  Temp(Src) 98.2 F (36.8 C) (Oral)  Resp 18  Ht 5\' 8"  (1.727 m)  Wt 77.111 kg (170 lb)  BMI 25.85 kg/m2  General appearance:  Patient appears well nourished, alert and oriented x 3, in no acute distress.  Eyes: extra occular movements intact, no obvious deformity  Ear, Nose, Mouth, Throat:  facial nerve symmetric and intact, auricle intact without any lesions or deformities.  Neck was examined and palpated and found to be free of any masses.  There was no evidence of adenopathy or thyromegaly.  Cranial nerves were grossly intact.  The nasal cavity, nasopharynx, oral cavity, oropharynx, were examined  and found to free of masses.  Palate elevation was symmetric.  Cardiovascular:  Regular rate and rhythm, no murmurs, rubs or gallops.  Respiratory:  Clear to auscultations bilaterally  Neuro:  Mentation intact, Cranial nerves intact      Binocular Microscopic exam:    Microscopic ear exam performed bilaterally.  Right EAC with 20% exostoses, otherwise normal exam bilaterally.    Audiogram:asymmetric AS SNHL - stable since 2007      MRI: from 2009 - no evidence of vestibular schwannoma    Summary:  In summary, this is a patient with asymmetric right SNHL -  normal MRI done in 2009.  Patient has noted worsening right tinnitus and onset of occasional vertigo. Will proceed with obtaining repeat MRI at this time.

## 2011-12-16 ENCOUNTER — Encounter (INDEPENDENT_AMBULATORY_CARE_PROVIDER_SITE_OTHER): Payer: Self-pay | Admitting: Otolaryngology

## 2011-12-16 NOTE — Telephone Encounter (Signed)
From: [name]  To: Jeremy Johann., MD  Sent: 12/16/2011 10:57 AM PDT  Subject: 2-Procedural Question    Hello Dr. Cyndie Chime,    I had my MRI on July 19, and am just checking to see when the results might be sent. It was nice to meet you.    Best regards,  Brittany [name]

## 2011-12-25 ENCOUNTER — Encounter (INDEPENDENT_AMBULATORY_CARE_PROVIDER_SITE_OTHER): Payer: Self-pay | Admitting: Internal Medicine

## 2011-12-26 ENCOUNTER — Encounter (INDEPENDENT_AMBULATORY_CARE_PROVIDER_SITE_OTHER): Payer: Self-pay | Admitting: Internal Medicine

## 2011-12-26 ENCOUNTER — Encounter (INDEPENDENT_AMBULATORY_CARE_PROVIDER_SITE_OTHER): Payer: BC Managed Care – HMO | Admitting: Internal Medicine

## 2011-12-26 ENCOUNTER — Ambulatory Visit (INDEPENDENT_AMBULATORY_CARE_PROVIDER_SITE_OTHER): Payer: BC Managed Care – HMO | Admitting: Internal Medicine

## 2011-12-26 VITALS — BP 88/56 | HR 64 | Temp 98.6°F | Resp 20 | Wt 168.0 lb

## 2011-12-26 MED ORDER — AMITRIPTYLINE HCL 10 MG OR TABS
10.0000 mg | ORAL_TABLET | Freq: Every evening | ORAL | Status: DC
Start: 2011-12-26 — End: 2012-03-02

## 2011-12-26 NOTE — Progress Notes (Signed)
The patient returns for followup of her GERD, hypothyroidism, insomnia, and right knee pain.  She states she continues to have episodes of palpitations as well as fatigue. She does admit to depressive symptoms and has been in therapy for depression in the past. However she has not seen anybody at the present time and does not want medication for this. She also is complaining of headache across her forehead like a band squeezing, mostly occurring at night. She denies any awakening from the headaches. She does associated with this had some difficulty sleeping for which he takes a small amount of the Ambien on an as needed basis.    Past Medical History   Diagnosis Date   . Infectious mononucleosis 08/2006   . Hypothyroidism      h/o hyperthyroidism s/p radioactive iodine   . Insomnia    . Menorrhagia        Current Outpatient Prescriptions   Medication Sig   . amitriptyline (ELAVIL) 10 MG tablet Take 1 tablet by mouth nightly.   . cetirizine (ZYRTEC) 10 MG tablet Take 10 mg by mouth daily.   . cyclobenzaprine (FLEXERIL) 5 MG tablet Take 1 tablet by mouth 3 times daily as needed for Muscle Spasms.   Marland Kitchen ibuprofen (MOTRIN) 800 MG tablet Take 1 tablet by mouth every 8 hours as needed for Mild Pain (Pain Score 1-3) or Moderate Pain (Pain Score 4-6).   Marland Kitchen levothyroxine (SYNTHROID) 112 MCG tablet Take 1 tablet by mouth every other day.   . levothyroxine (SYNTHROID) 125 MCG tablet Take 1 tablet by mouth every other day.   Marland Kitchen omeprazole (PRILOSEC OTC) 20 MG EC tablet Take 20 mg by mouth daily.   Marland Kitchen DISCONTD: valACYclovir (VALTREX) 1 GM tablet Take 1 tablet by mouth 2 times daily.   Marland Kitchen zolpidem (AMBIEN) 10 MG tablet Take 1 tablet by mouth nightly as needed for Insomnia.     No current facility-administered medications for this visit.       Allergies   Allergen Reactions   . Vicodin (Hydrocodone-Acetaminophen) Hallucinations       ROS--    No symptoms of hypo or hyperthyroidism: no decreased or increased weight, no feeling cold/chilly  or excessively warm, no diarrhea or constipation, no undue sweatiness, anxiety or palpitations.    The patient denies cough, chest pain, dyspnea, wheezing or hemoptysis.    Patient denies any exertional chest pain, dyspnea, palpitations, syncope, orthopnea, edema or paroxysmal nocturnal dyspnea.    The patient denies abdominal or flank pain, anorexia, nausea or vomiting, dysphagia, change in bowel habits or black or bloody stools or weight loss.    The patient denies swelling, numbness, tingling or weakness in the extremities.    PE--    She appears well, in no apparent distress.  Alert and oriented times three, pleasant and cooperative. Vital signs are as noted by the nurse.    Filed Vitals:    12/26/11 1538   BP: 88/56   Pulse: 64   Temp: 98.6 F (37 C)   TempSrc: Oral   Resp: 20   Weight: 76.204 kg (168 lb)   SpO2: 96%       Thyroid not palpable, not enlarged, no nodules detected.    Chest is clear, no wheezing or rales. Normal symmetric air entry throughout both lung fields. No chest wall deformities or tenderness.    S1 and S2 normal, no murmurs, clicks, gallops or rubs. Regular rate and rhythm. Chest is clear; no wheezes or rales. No  edema or JVD.    The abdomen is soft without tenderness, guarding, mass, rebound or organomegaly. Bowel sounds are normal. No CVA tenderness or inguinal adenopathy noted.    Extremities: extremities, peripheral pulses and reflexes normal, no edema, redness or tenderness in the calves or thighs, feet normal, good pulses, normal color, temperature and sensation.      Lab results are reviewed and are as below:    Results for orders placed in visit on 11/13/11   TSH, BLOOD       Result Value Range    TSH 0.78  0.27 - 4.20 uIU/mL   TOTAL T3, BLOOD       Result Value Range    T3 Total 0.9  0.8 - 2.0 ng/mL   FREE THYROXINE, BLOOD       Result Value Range    Free T4 1.34  0.93 - 1.70 ng/dL       A/P--    1. Acquired hypothyroidism (244.9)--the patient is clinically euthyroid with her  symptoms unrelated to her thyroid level. Will continue her current regimen.     2. Tension headache (307.81)--will try empiric treatment with low-dose amitriptyline.  amitriptyline (ELAVIL) 10 MG tablet   3. Insomnia (780.52)--she will have amitriptyline as above.     4. Depression (311)--the patient was encouraged to restart psychotherapy.     5. Gastroesophageal reflux disease (530.81)--continue current treatment.

## 2011-12-26 NOTE — Interdisciplinary (Signed)
AVS instructions given to patient  post clinic visit with good understanding.

## 2012-01-14 ENCOUNTER — Telehealth (INDEPENDENT_AMBULATORY_CARE_PROVIDER_SITE_OTHER): Payer: Self-pay | Admitting: Internal Medicine

## 2012-01-14 NOTE — Telephone Encounter (Signed)
Noted, thanks!

## 2012-01-14 NOTE — Telephone Encounter (Signed)
Pt canceled appointment 9.11.13 with Dr Pattricia Boss @ 1540.  Pt R/S 11.7.13

## 2012-01-27 ENCOUNTER — Encounter (INDEPENDENT_AMBULATORY_CARE_PROVIDER_SITE_OTHER): Payer: BC Managed Care – HMO | Admitting: Obstetrics & Gynecology

## 2012-01-29 ENCOUNTER — Encounter (INDEPENDENT_AMBULATORY_CARE_PROVIDER_SITE_OTHER): Payer: Self-pay | Admitting: Internal Medicine

## 2012-01-31 ENCOUNTER — Other Ambulatory Visit (INDEPENDENT_AMBULATORY_CARE_PROVIDER_SITE_OTHER): Payer: Self-pay | Admitting: Internal Medicine

## 2012-02-11 ENCOUNTER — Encounter (INDEPENDENT_AMBULATORY_CARE_PROVIDER_SITE_OTHER): Payer: Self-pay | Admitting: Internal Medicine

## 2012-02-11 NOTE — Telephone Encounter (Signed)
Message sent to patient. FYI.

## 2012-02-11 NOTE — Telephone Encounter (Signed)
From: [name]  To: Patric Dykes, MD  Sent: 02/11/2012 10:04 AM PDT  Subject: 1-Non Urgent Medical Advice    Hello Dr. Pattricia Boss,    I have a check-up with you scheduled for November 7, and would like to get the flu vaccination at that time.    Thank you,  [name]

## 2012-03-02 ENCOUNTER — Ambulatory Visit (INDEPENDENT_AMBULATORY_CARE_PROVIDER_SITE_OTHER): Admitting: Internal Medicine

## 2012-03-02 ENCOUNTER — Encounter (INDEPENDENT_AMBULATORY_CARE_PROVIDER_SITE_OTHER): Payer: Self-pay | Admitting: Internal Medicine

## 2012-03-02 VITALS — BP 95/65 | HR 89 | Temp 98.2°F | Resp 20 | Wt 171.0 lb

## 2012-03-02 NOTE — Interdisciplinary (Signed)
Influenza vaccine given to patient without complication.  VIS and AVS instructions given to patient  post clinic visit with good understanding.

## 2012-03-02 NOTE — Progress Notes (Signed)
The patient returns for followup of her tension headache, insomnia, acquired hypothyroidism, and depression.  She states that she was unable to tolerate Elavil, but has been using Afrin Ambien as needed for sleep with improved sleeping patterns and therefore improvement in her headache. She also has been seeing a chiropractor for both headache as well as her depression with improvement in both. She denies any symptoms of hyper or hypothyroidism.    Past Medical History   Diagnosis Date   . Infectious mononucleosis 08/2006   . Hypothyroidism      h/o hyperthyroidism s/p radioactive iodine   . Insomnia    . Menorrhagia        Current Outpatient Prescriptions   Medication Sig   . [DISCONTINUED] amitriptyline (ELAVIL) 10 MG tablet Take 1 tablet by mouth nightly.   . cetirizine (ZYRTEC) 10 MG tablet Take 10 mg by mouth daily.   . cyclobenzaprine (FLEXERIL) 5 MG tablet Take 1 tablet by mouth 3 times daily as needed for Muscle Spasms.   Marland Kitchen ibuprofen (MOTRIN) 800 MG tablet Take 1 tablet by mouth every 8 hours as needed for Mild Pain (Pain Score 1-3) or Moderate Pain (Pain Score 4-6).   Marland Kitchen levothyroxine (SYNTHROID) 112 MCG tablet Take 1 tablet by mouth every other day.   . levothyroxine (SYNTHROID) 125 MCG tablet Take 1 tablet by mouth every other day.   Marland Kitchen omeprazole (PRILOSEC OTC) 20 MG EC tablet Take 20 mg by mouth daily.   Marland Kitchen zolpidem (AMBIEN) 10 MG tablet Take 1 tablet by mouth nightly as needed for Insomnia.     No current facility-administered medications for this visit.       Allergies   Allergen Reactions   . Vicodin (Hydrocodone-Acetaminophen) Hallucinations       ROS--    No symptoms of hypo or hyperthyroidism: no decreased or increased weight, no feeling cold/chilly or excessively warm, no diarrhea or constipation, no undue sweatiness, anxiety or palpitations.    The patient denies cough, chest pain, dyspnea, wheezing or hemoptysis.    Patient denies any exertional chest pain, dyspnea, palpitations, syncope,  orthopnea, edema or paroxysmal nocturnal dyspnea.    The patient denies abdominal or flank pain, anorexia, nausea or vomiting, dysphagia, change in bowel habits or black or bloody stools or weight loss.    The patient denies swelling, numbness, tingling or weakness in the extremities.    PE--    She appears well, in no apparent distress.  Alert and oriented times three, pleasant and cooperative. Vital signs are as noted by the nurse.    Filed Vitals:    03/02/12 0957   BP: 95/65   Pulse: 89   Temp: 98.2 F (36.8 C)   TempSrc: Oral   Resp: 20   Weight: 77.565 kg (171 lb)   SpO2: 100%       Thyroid not palpable, not enlarged, no nodules detected.    Chest is clear, no wheezing or rales. Normal symmetric air entry throughout both lung fields. No chest wall deformities or tenderness.    S1 and S2 normal, no murmurs, clicks, gallops or rubs. Regular rate and rhythm. Chest is clear; no wheezes or rales. No edema or JVD.    The abdomen is soft without tenderness, guarding, mass, rebound or organomegaly. Bowel sounds are normal. No CVA tenderness or inguinal adenopathy noted.    Extremities: extremities, peripheral pulses and reflexes normal, no edema, redness or tenderness in the calves or thighs, feet normal, good pulses, normal color,  temperature and sensation.    A/P--    1. Tension headache (307.81)--symptoms have improved. She can continue with the chiropractor, but has been advised that a voiding forceful neck movements or adjustments.     2. Insomnia (780.52)--she can continue Ambien as needed.     3. Depression (311)--since she feels she needs no treatment or referrals at this time, will follow.     4. Acquired hypothyroidism (244.9)--will recheck TSH and free T4 prior to her next visit.  TSH, BLOOD, FREE THYROXINE, BLOOD

## 2012-03-26 ENCOUNTER — Encounter (INDEPENDENT_AMBULATORY_CARE_PROVIDER_SITE_OTHER): Payer: Self-pay | Admitting: Internal Medicine

## 2012-04-01 ENCOUNTER — Encounter (INDEPENDENT_AMBULATORY_CARE_PROVIDER_SITE_OTHER): Payer: Self-pay | Admitting: Internal Medicine

## 2012-04-01 NOTE — Telephone Encounter (Signed)
Order has been signed.

## 2012-04-01 NOTE — Telephone Encounter (Signed)
Dr Farber, please sign referral if ok. Thanks.

## 2012-04-01 NOTE — Telephone Encounter (Signed)
From: [name]  To: Patric Dykes, MD  Sent: 04/01/2012 12:20 PM PST  Subject: 3-Referral Request Status    Hello Dr. Pattricia Boss,    Please enter a referral in the system for me to see Dr. Guinevere Ferrari, the [facility] dermatologist I saw last October. She recommended an annual visit, plus I have a new mole that is rather large that I would like to have checked out.    Thank you!  [name]

## 2012-04-02 NOTE — Telephone Encounter (Signed)
Message sent to patient

## 2012-04-21 ENCOUNTER — Ambulatory Visit (INDEPENDENT_AMBULATORY_CARE_PROVIDER_SITE_OTHER): Payer: No Typology Code available for payment source | Admitting: Dermatopathology

## 2012-04-21 ENCOUNTER — Encounter (INDEPENDENT_AMBULATORY_CARE_PROVIDER_SITE_OTHER): Payer: Self-pay | Admitting: Dermatopathology

## 2012-04-21 DIAGNOSIS — D239 Other benign neoplasm of skin, unspecified: Secondary | ICD-10-CM

## 2012-04-21 DIAGNOSIS — D229 Melanocytic nevi, unspecified: Secondary | ICD-10-CM

## 2012-04-21 DIAGNOSIS — L708 Other acne: Secondary | ICD-10-CM

## 2012-04-21 DIAGNOSIS — D2330 Other benign neoplasm of skin of unspecified part of face: Secondary | ICD-10-CM

## 2012-04-21 DIAGNOSIS — L821 Other seborrheic keratosis: Secondary | ICD-10-CM

## 2012-04-21 DIAGNOSIS — L578 Other skin changes due to chronic exposure to nonionizing radiation: Secondary | ICD-10-CM

## 2012-04-21 MED ORDER — TRETINOIN 0.025 % EX CREA
TOPICAL_CREAM | CUTANEOUS | Status: DC
Start: 2012-04-21 — End: 2015-03-21

## 2012-04-21 NOTE — Patient Instructions (Addendum)
2.5 % benzoyl peroxide cream for spot treatment    [facility]    SUN PROTECTION      Why protect against the sun?  In the past, sun exposure was thought to be a healthy benefit of outdoor activity. However, studies have shown many unhealthy effects of sun exposure, such as early aging of the skin and skin cancer.  What kind of damage does sun exposure cause?  Part of the sun's energy that reaches earth is composed of rays of invisible ultraviolet (UV) light. When ultraviolet light rays (UVA and UVB) enter the skin, they damage skin cells, causing visible and invisible injuries.  Sunburn is a visible type of damage, which appears just a few hours after sun exposure. In many people, this type of damage also causes tanning. Freckles, which occur in people with fair skin, are usually due to sun exposure. Freckles are nearly always a sign that sun damage has occurred, and therefore show the need for sun protection.  Ultraviolet light rays also cause invisible damage to skin cells. Some of the injury is repaired, but some of the cell damage adds up year after year. After 20 to 30 years or more, the built-up damage appears as wrinkles, age spots, and even skin cancer. Although window glass blocks UVB light, UVA rays are able to penetrate through glass.  How can I protect myself from excessive sun exposure?  Marland Kitchen Avoidance. Stay away from the sun in the middle of the day.  Wynelle Link exposure is more intense closer to the equator, in the mountains, and in the summer. The sun's damaging effects are increased by reflection from water, white sand, and snow. Avoid long periods of direct sun exposure. Sit or play in the shade, especially when your shadow is shorter than you are tall.  . Use sun protective clothing.  Cover up with light colored clothing when outdoors, including a hat to protect the scalp and face. In addition to filtering out the sun, tightly woven clothing reflects heat and helps keep you feeling cool. Sunglasses  that block ultraviolet rays protect the eyes and eyelids. Multiple retailers now sell sun protective clothing for adults and children.  Rash guards should be worn during outdoor swimming activity.  . Block sun damage by applying a broad-spectrum UVA and UVB sunscreen with an SPF of 30 or higher and reapply approximately every two hours, even on cloudy days. If swimming or participating in intense physical activity, sunscreen may need to be applied more often.  How do I select the right sunscreen?    Choose a sunscreen with a SPF 30 or higher. The protective ability of sunscreen is rated by Wynelle Link Protection Factor (SPF) -- the higher the SPF, the stronger the protection. Spread it evenly over all uncovered skin, including ears and lips, but avoid the eyelids. Apply sunscreen about 30 minutes before sun exposure. Re-apply after swimming or excessive sweating.    Most importantly, choose a sunscreen that you will wear.  New sunscreens are added to the marketplace frequently, and selection of a particular brand is often a matter of personal preference.       What about the controversies regarding sunscreens?    Hats, clothing, and shade are the most reliable forms of sun protection.  Few people use enough sunscreen to benefit from the [facility] protection listed on the label. Most practitioners in our group continue to recommend and utilize many sunscreen products, including chemical and physical sunscreens.  However, studies show that people  typically use about a quarter of the recommended amount.     There has been much news coverage recently about the possible dangers of sunscreens. The Environmental Working Group publishes an annual sunscreen report.  The 2010 report, which received a considerable amount of media attention, cited concerns about a form of vitamin A called retinyl palmitate, found in many sunscreens. Safety testing is far from conclusive, and the FDA continues to investigate whether this compound may  accelerate skin damage and elevate skin cancer risk when applied to skin exposed to sunlight.      Many have also raised concerns about chemical sunscreens, which contain substances such as oxybenzone, avobenzone, benzophenone, and parsol 1789. Some believe that these may be converted into hormones when absorbed through the skin. Some prefer physical agents such as zinc oxide or titanium dioxide, however efforts to make these agents more sheer and cosmetically acceptable have resulted in formulations that may contain tiny particles known as "nanoparticles."  Concerns also exist about their absorption.  Most of the above concerns are theoretical; however, if you are highly concerned about these issues, you can visit the Environmental Working Group's website devoted to sunscreen safety: http://www.marquez-love.com/ sunscreen.   Most dermatologists remain convinced that the risks of unprotected sun exposure far outweigh the above mentioned theoretical risks of sunscreens. The type of sunprotection used remains an individual choice for each parent.    What about vitamin D?    Vitamin D is essential for many processes in the body, and is important for bone growth in children.  Over the last few years, many studies have suggested an association between low vitamin D levels and increased risk of certain types of cancers, neurologic disease, autoimmune disease and cardiovascular disease.  While dermatologists agree that the sun is certainly a source of vitamin D, we are also uniquely aware it is also a source of harmful ultraviolet radiation resulting in thousands of skin cancers each year.  The official recommendation of the American Academy of Dermatology (AAD) is that vitamin D should be obtained through dietary sources and supplementation rather than from sunlight (ultraviolet radiation).

## 2012-04-21 NOTE — Progress Notes (Signed)
Primary MD: [name] Jan    HISTORY:  Brittany [name] is a 46 year old female presents for     Has spot on right cheek for year. Squeezes and stuff comes out. Not going away.    Spot on left hip. Getting bigger. At pantline.  Rough in texture.    Being good about sun protection most of the time. Wears sunscreen, hat.    Family history: no significant change from previous visit.    Medications:  Reviewed at this visit.    The patient is allergic to vicodin.    REVIEW OF SYSTEMS:  CONSTITUTIONAL: Negative for fever  GASTROINTESTINAL: No nausea or diarrhea.  Feels well, no other skin complaints.    PHYSICAL EXAM:  Skin Type:                    2  General Appearance:   Normal  Neuro/psych:                Alert and oriented x 3  Cutaneous exam The eyes, head, oral mucosa, scalp, neck, chest, back, abdomen, external genitalia, buttock, right arm, left arm, right leg, left leg, digits/nails were examined and were normal except:    --small cyst on cheek  --rough stuck on papule on the left hip  --mild-moderate sun damage  --scattered even-bordered, even-pigmented macules and papules on the trunk and extremities    ASSESSMENT AND TREATMENT PLAN:  1. Dilated pore on cheek  --topical tretinoin nightly or every other night  2. Seborrheic keratosis  --educated patient on benign nature of growths, no need for therapy unless bothersome  3. Sun damaged skin  --educated patient on sun protection with sunscreen spf 30 or greater, avoidance of peak sun hours, seeking shade and wearing sun protective clothing  4. Nevi  --educated patient on ABCDEs of concerning nevi, self skin exams and encouraged to return with any new or concerning lesions      - Diagnoses, natural course, treatments and risks were discussed with the patient.  - Follow up 1 year    Baird Lyons A. Mikle Bosworth, MD, PhD  Assistant Professor of Medicine, Dermatology

## 2012-04-23 ENCOUNTER — Other Ambulatory Visit (INDEPENDENT_AMBULATORY_CARE_PROVIDER_SITE_OTHER): Payer: Self-pay | Admitting: Internal Medicine

## 2012-04-23 MED ORDER — IBUPROFEN 800 MG OR TABS
800.0000 mg | ORAL_TABLET | Freq: Three times a day (TID) | ORAL | Status: DC | PRN
Start: 2012-04-23 — End: 2013-03-31

## 2012-04-23 NOTE — Telephone Encounter (Signed)
Last visit 03/02/2012.  No next visit seen.  Last filled 04/22/2011. Rx has expired.

## 2012-06-03 ENCOUNTER — Encounter: Payer: Self-pay | Admitting: Hospital

## 2012-06-25 ENCOUNTER — Encounter (INDEPENDENT_AMBULATORY_CARE_PROVIDER_SITE_OTHER): Payer: No Typology Code available for payment source | Admitting: Obstetrics & Gynecology

## 2012-07-27 ENCOUNTER — Encounter (INDEPENDENT_AMBULATORY_CARE_PROVIDER_SITE_OTHER): Payer: Self-pay | Admitting: Internal Medicine

## 2012-07-27 NOTE — Telephone Encounter (Signed)
Message sent to patient. FYI.

## 2012-07-27 NOTE — Telephone Encounter (Signed)
From: [name]  To: Patric Dykes, MD  Sent: 07/27/2012 10:46 AM PDT  Subject: 2-Procedural Question    Hello Dr Pattricia Boss,    I've scheduled my 25-month follow-up with you for March 26. Please submit an authorization so that I can have the thyroid bloodwork done prior to this appointment.    Thank you!   [name]

## 2012-08-05 ENCOUNTER — Encounter (HOSPITAL_BASED_OUTPATIENT_CLINIC_OR_DEPARTMENT_OTHER): Payer: Self-pay | Admitting: Otolaryngology

## 2012-08-05 ENCOUNTER — Ambulatory Visit: Payer: BC Managed Care – PPO | Attending: Otolaryngology | Admitting: Otolaryngology

## 2012-08-05 VITALS — BP 128/74 | HR 75 | Temp 97.6°F | Resp 16

## 2012-08-05 DIAGNOSIS — H6120 Impacted cerumen, unspecified ear: Secondary | ICD-10-CM | POA: Insufficient documentation

## 2012-08-06 ENCOUNTER — Other Ambulatory Visit: Payer: BC Managed Care – PPO | Attending: Internal Medicine

## 2012-08-06 DIAGNOSIS — E039 Hypothyroidism, unspecified: Secondary | ICD-10-CM | POA: Insufficient documentation

## 2012-08-07 ENCOUNTER — Encounter: Payer: Self-pay | Admitting: Hospital

## 2012-08-12 ENCOUNTER — Encounter (INDEPENDENT_AMBULATORY_CARE_PROVIDER_SITE_OTHER): Payer: BC Managed Care – PPO | Admitting: Internal Medicine

## 2012-08-25 ENCOUNTER — Ambulatory Visit (INDEPENDENT_AMBULATORY_CARE_PROVIDER_SITE_OTHER): Payer: BC Managed Care – PPO | Admitting: Obstetrics & Gynecology

## 2012-08-25 VITALS — BP 116/76 | HR 96 | Temp 98.0°F | Ht 70.0 in | Wt 173.0 lb

## 2012-08-25 MED ORDER — LEVONORGESTREL-ETHINYL ESTRAD 0.1-20 MG-MCG OR TABS
1.0000 | ORAL_TABLET | Freq: Every day | ORAL | Status: DC
Start: 2012-08-25 — End: 2013-04-29
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
## 2012-08-28 ENCOUNTER — Encounter (INDEPENDENT_AMBULATORY_CARE_PROVIDER_SITE_OTHER): Payer: BC Managed Care – PPO | Admitting: Internal Medicine

## 2012-09-11 ENCOUNTER — Encounter (INDEPENDENT_AMBULATORY_CARE_PROVIDER_SITE_OTHER): Payer: Self-pay | Admitting: Internal Medicine

## 2012-09-11 DIAGNOSIS — Z0000 Encounter for general adult medical examination without abnormal findings: Secondary | ICD-10-CM

## 2012-09-17 ENCOUNTER — Encounter (INDEPENDENT_AMBULATORY_CARE_PROVIDER_SITE_OTHER): Payer: Self-pay | Admitting: Internal Medicine

## 2012-09-17 ENCOUNTER — Ambulatory Visit (INDEPENDENT_AMBULATORY_CARE_PROVIDER_SITE_OTHER): Payer: BC Managed Care – PPO | Admitting: Internal Medicine

## 2012-09-17 VITALS — BP 112/73 | HR 91 | Temp 98.0°F | Resp 14 | Wt 178.0 lb

## 2012-09-17 MED ORDER — LEVOTHYROXINE SODIUM 125 MCG OR TABS
125.0000 ug | ORAL_TABLET | ORAL | Status: DC
Start: 2012-09-17 — End: 2013-08-20

## 2012-09-17 MED ORDER — LEVOTHYROXINE SODIUM 112 MCG OR TABS
112.0000 ug | ORAL_TABLET | ORAL | Status: DC
Start: 2012-09-17 — End: 2013-08-20

## 2012-09-17 NOTE — Patient Instructions (Signed)
Your lab tests should be done 1 week before your next visit.

## 2012-09-17 NOTE — Progress Notes (Signed)
The patient returns for followup of her hypothyroidism, tension headache, insomnia, and depression. She says she is doing well with no symptoms of hypo-or hyperthyroidism and improvement in her headaches. She is now needing Ambien for her insomnia only approximately once per month. She has no depressive symptoms and notes that her reflux is under control.    Past Medical History   Diagnosis Date    Infectious mononucleosis 08/2006    Hypothyroidism      h/o hyperthyroidism s/p radioactive iodine    Insomnia     Menorrhagia        Current Outpatient Prescriptions   Medication Sig    cetirizine (ZYRTEC) 10 MG tablet Take 10 mg by mouth daily.    cyclobenzaprine (FLEXERIL) 5 MG tablet Take 1 tablet by mouth 3 times daily as needed for Muscle Spasms.    ibuprofen (MOTRIN) 800 MG tablet Take 1 tablet by mouth every 8 hours as needed for Mild Pain (Pain Score 1-3) or Moderate Pain (Pain Score 4-6).    levonorgestrel-ethinyl estradiol (AVIANE) 0.1-20 MG-MCG per tablet Take 1 tablet by mouth daily.    levothyroxine (SYNTHROID) 112 MCG tablet Take 1 tablet by mouth every other day.    levothyroxine (SYNTHROID) 125 MCG tablet Take 1 tablet by mouth every other day.    omeprazole (PRILOSEC OTC) 20 MG EC tablet Take 20 mg by mouth daily.    tretinoin (RETIN-A) 0.025 % cream Apply a thin layer at bedtime as directed    zolpidem (AMBIEN) 10 MG tablet Take 1 tablet by mouth nightly as needed for Insomnia.     No current facility-administered medications for this visit.       Allergies   Allergen Reactions    Vicodin (Hydrocodone-Acetaminophen) Hallucinations       ROS--    No symptoms of hypo or hyperthyroidism: no decreased or increased weight, no feeling cold/chilly or excessively warm, no diarrhea or constipation, no undue sweatiness, anxiety or palpitations.    The patient denies cough, chest pain, dyspnea, wheezing or hemoptysis.    Patient denies any exertional chest pain, dyspnea, palpitations, syncope,  orthopnea, edema or paroxysmal nocturnal dyspnea.    The patient denies abdominal or flank pain, anorexia, nausea or vomiting, dysphagia, change in bowel habits or black or bloody stools or weight loss.    The patient denies swelling, numbness, tingling or weakness in the extremities.    PE--    She appears well, in no apparent distress.  Alert and oriented times three, pleasant and cooperative. Vital signs are as noted by the nurse.    Filed Vitals:    09/17/12 1231   BP: 112/73   Pulse: 91   Temp: 98 F (36.7 C)   TempSrc: Oral   Resp: 14   Weight: 80.74 kg (178 lb)   SpO2: 99%       Thyroid not palpable, not enlarged, no nodules detected.    Chest is clear, no wheezing or rales. Normal symmetric air entry throughout both lung fields. No chest wall deformities or tenderness.    S1 and S2 normal, no murmurs, clicks, gallops or rubs. Regular rate and rhythm. Chest is clear; no wheezes or rales. No edema or JVD.    The abdomen is soft without tenderness, guarding, mass, rebound or organomegaly. Bowel sounds are normal. No CVA tenderness or inguinal adenopathy noted.    Extremities: extremities, peripheral pulses and reflexes normal, no edema, redness or tenderness in the calves or thighs, feet normal, good pulses,  normal color, temperature and sensation.    Labs are reviewed and are as below:    Results for orders placed in visit on 08/06/12   TSH, BLOOD       Result Value Range    TSH 1.23  0.27 - 4.20 uIU/mL   FREE THYROXINE, BLOOD       Result Value Range    Free T4 1.60  0.93 - 1.70 ng/dL       A/P--      [id number]    1. Acquired hypothyroidism--the patient is both clinically and chemically euthyroid and therefore will continue her current levothyroxine regimen.  244.9 levothyroxine (SYNTHROID) 112 MCG tablet     levothyroxine (SYNTHROID) 125 MCG tablet     TSH, BLOOD     FREE THYROXINE, BLOOD   2. Tension headache--improved. Will follow.  307.81    3. Insomnia--symptoms have improved. She can continue Ambien as  needed.  780.52    4. Depression--she is without symptoms.  311    5. Gastroesophageal reflux disease--she will continue omeprazole.  530.81

## 2012-09-29 NOTE — Progress Notes (Signed)
I have taken the history and performed a comprehensive ENT examination. The past medical history, social history, and medications were reviewed with the patient. A 10 point review of systems was conducted and pertinent positives included in the history. Over 50% of the visit time was spent talking with the patient and coordinating care.     A dictation summarizing my impression and plans along with relevant exam findings has been completed.     Laloni Rowton, MD  Otolaryngology / Head and Neck Surgery

## 2012-09-30 NOTE — Progress Notes (Signed)
CLINIC: [facility]    REPORT TYPE: NOTE    Dictating Practitioner: Smitty Knudsen, M.D.    DATE OF SERVICE:  08/05/2012    REASON FOR VISIT: [facility]        Patient:  Brittany [name], Brittany [name]  MR#: [id number]  DOS:  08/05/2012  DOB:  07/12/1965  SUBJECTIVE: Brittany [name] is a 47 year old female here today for ear cleaning.  She had previously seen Dr. Cyndie Chime of our group for hearing loss and  underwent a prior MRI. She returns today feeling like the ears are blocked  up with wax and would like to have them cleaned. She denies any pain or  drainage or hearing changes.    OBJECTIVE  GENERAL: She appears well.  HEAD AND NECK: She is examined under the microscope. Moderate cerumen is  cleaned with microinstrumentation bilaterally. The underlying tympanic  membranes are intact. Anterior rhinoscopy is unremarkable. The oral cavity  and oropharynx are without mass or lesion. The neck is supple, without  masses.    ASSESSMENT/PLAN: [name] was affected by cerumen impaction. She was  cleaned in the clinic today. She can follow up as needed.  External CCs -------- > > >  (do NOT remove @ @ signs)                        Electronically signed by:  Smitty Knudsen, M.D. 10/01/2012 04:03 P          DD: 09/29/2012    DT: 09/30/2012 01:53 A   DocNo.: [id number]  [id number]                 [id number]        cc:

## 2013-03-25 ENCOUNTER — Encounter: Payer: Self-pay | Admitting: Hospital

## 2013-03-25 ENCOUNTER — Telehealth (INDEPENDENT_AMBULATORY_CARE_PROVIDER_SITE_OTHER): Payer: Self-pay | Admitting: Internal Medicine

## 2013-03-25 ENCOUNTER — Other Ambulatory Visit: Payer: BC Managed Care – PPO | Attending: Internal Medicine

## 2013-03-25 DIAGNOSIS — E039 Hypothyroidism, unspecified: Secondary | ICD-10-CM | POA: Insufficient documentation

## 2013-03-25 DIAGNOSIS — Z113 Encounter for screening for infections with a predominantly sexual mode of transmission: Secondary | ICD-10-CM | POA: Insufficient documentation

## 2013-03-25 NOTE — Telephone Encounter (Signed)
Forwarded to Dr Farber for advise/order. Thanks.

## 2013-03-26 ENCOUNTER — Encounter: Payer: Self-pay | Admitting: Hospital

## 2013-03-29 ENCOUNTER — Encounter (INDEPENDENT_AMBULATORY_CARE_PROVIDER_SITE_OTHER): Payer: Self-pay | Admitting: Internal Medicine

## 2013-03-29 ENCOUNTER — Ambulatory Visit (INDEPENDENT_AMBULATORY_CARE_PROVIDER_SITE_OTHER): Payer: BC Managed Care – PPO | Admitting: Internal Medicine

## 2013-03-29 VITALS — BP 112/74 | HR 90 | Temp 98.7°F | Resp 16 | Wt 170.0 lb

## 2013-03-29 MED ORDER — FLUTICASONE PROPIONATE 50 MCG/ACT NA SUSP
1.00 | Freq: Two times a day (BID) | NASAL | Status: AC
Start: 2013-03-29 — End: ?

## 2013-03-30 ENCOUNTER — Other Ambulatory Visit (INDEPENDENT_AMBULATORY_CARE_PROVIDER_SITE_OTHER): Payer: Self-pay | Admitting: Internal Medicine

## 2013-03-31 MED ORDER — IBUPROFEN 800 MG OR TABS
800.0000 mg | ORAL_TABLET | Freq: Three times a day (TID) | ORAL | Status: DC | PRN
Start: 2013-03-31 — End: 2014-10-10

## 2013-04-27 ENCOUNTER — Ambulatory Visit (INDEPENDENT_AMBULATORY_CARE_PROVIDER_SITE_OTHER): Payer: BC Managed Care – PPO | Admitting: Dermatopathology

## 2013-04-27 ENCOUNTER — Encounter (INDEPENDENT_AMBULATORY_CARE_PROVIDER_SITE_OTHER): Payer: Self-pay | Admitting: Dermatopathology

## 2013-04-27 DIAGNOSIS — L821 Other seborrheic keratosis: Secondary | ICD-10-CM

## 2013-04-27 DIAGNOSIS — D229 Melanocytic nevi, unspecified: Secondary | ICD-10-CM

## 2013-04-27 DIAGNOSIS — D485 Neoplasm of uncertain behavior of skin: Secondary | ICD-10-CM

## 2013-04-27 DIAGNOSIS — D239 Other benign neoplasm of skin, unspecified: Secondary | ICD-10-CM

## 2013-04-27 DIAGNOSIS — D235 Other benign neoplasm of skin of trunk: Secondary | ICD-10-CM

## 2013-04-27 DIAGNOSIS — L578 Other skin changes due to chronic exposure to nonionizing radiation: Secondary | ICD-10-CM

## 2013-04-27 NOTE — Patient Instructions (Signed)
 [facility]  CARE INSTRUCTIONS  SURGERY/BIOPSY SITES    1.  Keep bandage dry and in place for 24 hours.  2.  After 24 hours, wash daily with a mild non-fragranced soap and water, pat dry. (Okay to shower or bathe)  3.  Once daily apply Aquaphor, petrolatum, or triple antibiotic ointment that you are not allergic to.  4.  Follow ointment with a band aid/bandage to protect clothing and absorb any drainage.  5.   If you have heavy scabbing, clean wound with hydrogen peroxide diluted 1:2 with tap water, for maximum of 3 days.  Apply with a cotton ball or cotton tipped applicator, let soak for 2-3 minutes, then pat dry, and follow with steps 3&4.  6.  For pain or discomfort we recommend Tylenol (Acetaminophen) or Extra-strength Tylenol, use as directed on bottle.  DO NOT use aspirin, Bufferin, Excedrin, Motrin, or any other aspirin containing products, which may cause bleeding.    Normal : NOT Normal:   -Tenderness at site for first couple of days. -Pain at site without aggravation   -Clear yellow or pink drainage    -Cloudy yellow drainage    -Fever/Chills     *If bleeding occurs:  Apply firm direct pressure with a clean cloth or gauze for 15 minutes straight.  After 15 minutes check site:   -If bleeding has slowed apply pressure for 15 more minutes and then it should stop.  If still bleeding heavily call:     Clinic, if during office hours- Loralie Champagne [phone], [place] [facility] [phone]   [facility] paging operator, after hours or on weekends at [phone].  Ask them to page the Dermatology resident on call.

## 2013-04-27 NOTE — Progress Notes (Signed)
 Primary MD: Orinda Birkenhead Jan    HISTORY:  Brittany [name] is a 47 year old female presents for follow up of sun damage.    Has a spot on the left side. Hits waistline of clothes. No bleeding or changing.  Gotten larger.      Using sun screen when out in sun. Has noticed more white spots on arms.  Has a mole on the right upper back that is slightly larger.     Family history: no significant change from previous visit.    Medications:  Reviewed at this visit.    The patient is allergic to vicodin.    REVIEW OF SYSTEMS:  CONSTITUTIONAL: Negative for fever  GASTROINTESTINAL: No nausea or diarrhea.  Feels well, no other skin complaints.    PHYSICAL EXAM:  Skin Type:                    2  General Appearance:   Normal  Neuro/psych:                Alert and oriented x 3  Cutaneous exam The eyes, head, scalp, oral mucosa, neck, chest, back, abdomen, left arm, right arm, buttock, left leg, right leg, digits/nails were examined and were normal except:    --moderate sun damaged skin  --3 cm brown plaque with brown papule at lateral edge on the left back  --scattered even-bordered, even-pigmented macules and papules on the trunk and extremities  --scattered tan stuck on papules on the trunk and extremities      ASSESSMENT AND TREATMENT PLAN:  1. Sun damaged skin  --educated patient on sun protection with sunscreen spf 30 or greater, avoidance of peak sun hours, seeking shade and wearing sun protective clothing  2. Nevi  --educated patient on ABCDEs of concerning nevi, self skin exams and encouraged to return with any new or concerning lesions  3. Seborrheic keratosis  --educated patient on benign nature of growths, no need for therapy unless bothersome  4. Neoplasm of uncertain behavior  --biopsy today for histologic evaluation; see procedure note below    - Diagnoses, natural course, treatments and risks were discussed with the patient.  - Follow up 6 months    Malvika Tung A. Constantino Demark, MD, PhD  Assistant Professor of Medicine,  Dermatology    PROCEDURE NOTE: SHAVE BIOPSY    Location: left back  Biopsy size: 30 mm  Diagnosis: neoplasm of uncertain behavior    A time out to confirm patient and site(s) was performed.  Informed consent was obtained. Risks of infection, pain, bleeding, and scar formation discussed. Prepped with alcohol. Anesthetized with  1% lidocaine with epinephrine (2 ml per site). Shave biopsy was performed and sent for histologic analysis. Hemostasis performed with 20% alumininum chloride. Wound care discussed and written instructions provided.

## 2013-04-29 ENCOUNTER — Telehealth (INDEPENDENT_AMBULATORY_CARE_PROVIDER_SITE_OTHER): Payer: Self-pay | Admitting: Dermatopathology

## 2013-04-29 ENCOUNTER — Other Ambulatory Visit: Payer: BC Managed Care – PPO | Attending: Obstetrics & Gynecology | Admitting: Obstetrics & Gynecology

## 2013-04-29 VITALS — BP 124/76 | HR 88 | Temp 97.3°F | Ht 70.0 in | Wt 164.0 lb

## 2013-04-29 DIAGNOSIS — Z01419 Encounter for gynecological examination (general) (routine) without abnormal findings: Secondary | ICD-10-CM | POA: Insufficient documentation

## 2013-04-29 DIAGNOSIS — N920 Excessive and frequent menstruation with regular cycle: Secondary | ICD-10-CM | POA: Insufficient documentation

## 2013-04-29 MED ORDER — LEVONORGESTREL-ETHINYL ESTRAD 0.1-20 MG-MCG OR TABS
1.0000 | ORAL_TABLET | Freq: Every day | ORAL | Status: DC
Start: 2013-04-29 — End: 2014-05-30

## 2013-04-29 NOTE — Progress Notes (Signed)
Interval History  Brittany [name] is a 47 year old female who is here for   Chief Complaint   Patient presents with    Gyn Exam   .    VITALS: BP 124/76   Pulse 88   Temp(Src) 97.3 F (36.3 C) (Oral)   Ht 5\' 10"  (1.778 m)   Wt 74.39 kg (164 lb)   BMI 23.53 kg/m2   LMP 03/29/2013    Allergies   Allergen Reactions    Vicodin [Hydrocodone-Acetaminophen] Hallucinations       1.  OB/GYN HISTORY:  Obstetric History    G0   P0   T0   P0   A0   TAB0   SAB0   E0   M0   L0      Patient's last menstrual period was 03/29/2013.  Pt reports normal flow now with menses.  Has had endometrial ablation with improvement in menorrhagia, then had intermittent heavy menses.  Tried low dose ocp and very happy with resulting lighter menses.  Wishes to continue ocp.  Last Mammogram was 3 month(s) ago.  Her last pap smear was 3 year(s), and it was normal, she has had no procedures.  She is not sexually active.     ROS:  GI:  negative  GU: negative.  I did review available medical, surgical, obstetrical and social history.    PHYSICAL EXAM:  Head: negative  Neck:  thyroid normal, no adenopathy  Breasts: no lymphadenopathy, no skin changes, no masses or discharge  Abdomen: abdomen soft, non-tender, BS normal, no masses or HSM  Pelvic: normal external female genitalia, BUS-normal appearing  Vulva/Vagina: normal  Cervix: normal in appearance, no lesions or masses  Uterus: normal sized, mobile and non-tender  Adnexa: no adnexal masses or tenderness  Rectal Exam: not performed  Abnormal Findings: none    IMPRESSION/ PLAN: Normal gyn exam    HCM: PAP Smear and mammogram    Counseling: Exercise and Monthly SBE         Patient barriers to learnng: none    Pt/Family understanding: verbalizes    Follow-Up: Return in 1 year for annual exam or  sooner if problems should occur.    Authored by: Rogelio Seen, MD

## 2013-04-29 NOTE — Telephone Encounter (Signed)
 Patient had a biopsy on 04/27/13 and is calling stating the site is itchy with a little red bump by the bandage and she's requesting advice.    Please give her a call back.

## 2013-04-29 NOTE — Telephone Encounter (Signed)
 Yes, that is appropriate. She can also use over the counter 1% hydrocortisone in itchy area but not directly on biopsy site.

## 2013-04-29 NOTE — Telephone Encounter (Signed)
 Called patient, she stated the site was itchy where the Bandaid adhesive was.     tentative plan:  Remove dressing, wash and pat dry, apply Aquaphor and telfa pad, use paper tape or a mild tape if this doesn't irritate your skin.     Informed patient we will confirm this plan with Dr.Carlos  Routing to Dr.Carlos

## 2013-04-30 LAB — DERM PATH TISSUE EXAM

## 2013-04-30 NOTE — Telephone Encounter (Signed)
 Left message to have pt call back

## 2013-05-03 NOTE — Telephone Encounter (Signed)
 Left message to have pt call back

## 2013-05-04 NOTE — Telephone Encounter (Signed)
 Spoke with pt  Explained wound care matte resolved

## 2013-05-04 NOTE — Telephone Encounter (Signed)
 Left message to have pt call back   Left detailed message per comments below    Closing encounter

## 2013-05-04 NOTE — Telephone Encounter (Signed)
 Patient calling back.  Please call her at work.  [place] ext [phone].

## 2013-05-04 NOTE — Telephone Encounter (Signed)
 Patient called back to also ask if or how long he should keep her bandage on for.

## 2013-05-04 NOTE — Procedures (Signed)
 SPECIMENS SUBMITTED:  Cervical (Pap) Smear;Thin-Prep Vial Received    CLINICAL INFORMATION:   No LMP Given; Other Clinical Findings: Screening  FINAL CYTOLOGIC INTERPRETATION:  No Atypical or Malignant Cells    COMMENT:  HPV Subtyping will be performed on excess Thin-Prep collection fluid from  the specimen vial.  Result will be reported in EPIC separately.    SPECIMEN ADEQUACY:  Satisfactory for evaluation  The Pap smear is a screening test with an inherent low error rate. Although  a normal [negative] result is highly predictive of the absence of cervical  cancer and its precursor lesions, it does not exclude the presence of  significant disease. Results must be evaluated within the context of the  individual patient.  CONFIDENTIAL HEALTH INFORMATION: Health Care information is personal and  sensitive information. If it is being faxed to you it is done so under  appropriate authorization from the patient or under circumstances that do  not require patient authorization. You, the recipient, are obligated to  maintain it in a safe, secure and confidential manner. Re-disclosure  without additional patient consent or as permitted by law is prohibited.  Unauthorized re-disclosure or failure to maintain confidentiality could  subject you to penalties described in federal and state law.  If you have  received this report or facsimile in error, please notify the   Pathology Department immediately and destroy the received document(s).    Material reviewed and Interpreted and  Report Electronically Signed by:  Lawernce Presto CT (ASCP)  05/04/13 08:48  Electronic Signature derived from a single  controlled access password

## 2013-05-07 ENCOUNTER — Telehealth (INDEPENDENT_AMBULATORY_CARE_PROVIDER_SITE_OTHER): Payer: Self-pay | Admitting: Dermatopathology

## 2013-05-07 NOTE — Telephone Encounter (Signed)
 Result letter sent.

## 2013-05-07 NOTE — Telephone Encounter (Signed)
Called patient and left message that biopsy showed a benign mole and no further treatment needed. Encouraged her to call with questions or concerns. Please send benign letter.

## 2013-05-12 ENCOUNTER — Encounter (INDEPENDENT_AMBULATORY_CARE_PROVIDER_SITE_OTHER): Payer: Self-pay | Admitting: Dermatopathology

## 2013-05-15 ENCOUNTER — Encounter: Payer: Self-pay | Admitting: Hospital

## 2013-06-04 ENCOUNTER — Telehealth (INDEPENDENT_AMBULATORY_CARE_PROVIDER_SITE_OTHER): Payer: Self-pay | Admitting: Internal Medicine

## 2013-06-04 NOTE — Telephone Encounter (Signed)
Pt c/o URI symptoms x9 days-no fever/travel. Appt made with Dr Marin Comment on 06/08/13 at 12:40pm for acute issue only.

## 2013-06-04 NOTE — Telephone Encounter (Signed)
Noted.  Thanks.  Larysa Pall, RN

## 2013-06-08 ENCOUNTER — Encounter (INDEPENDENT_AMBULATORY_CARE_PROVIDER_SITE_OTHER): Payer: Self-pay | Admitting: Internal Medicine

## 2013-06-08 ENCOUNTER — Ambulatory Visit (INDEPENDENT_AMBULATORY_CARE_PROVIDER_SITE_OTHER): Payer: BC Managed Care – PPO | Admitting: Internal Medicine

## 2013-06-08 VITALS — BP 120/70 | HR 84 | Temp 97.5°F | Wt 165.0 lb

## 2013-06-08 DIAGNOSIS — B9789 Other viral agents as the cause of diseases classified elsewhere: Secondary | ICD-10-CM

## 2013-06-08 DIAGNOSIS — B349 Viral infection, unspecified: Principal | ICD-10-CM

## 2013-06-08 NOTE — Progress Notes (Signed)
Chief Complaint   Patient presents with    URI       History of present illness(es):    Brittany [name] is a 48 year old female who comes to the office for uri.     Started jan 7 with head cold and body aches  Productive cough started last week but has mostly gone away.  Feels really tired.   Takes flonase and zyrtec or chronic nasal issues  Much less mucous production.  Took a few days off .   No sore throat and/or ear pain (did have them last week but has since  resolved)  No facial or dental pain.     Review of systems:    No diarrhea  No chest pain  No chills  No rigors  No visual changes   Allergies   Allergen Reactions    Vicodin [Hydrocodone-Acetaminophen] Hallucinations       Medical History:  Past Medical History   Diagnosis Date    Infectious mononucleosis 08/2006    Hypothyroidism      h/o hyperthyroidism s/p radioactive iodine    Insomnia     Menorrhagia      Patient Active Problem List   Diagnosis    Infectious Mononucleosis    Health maintenance examination    Allergic Rhinitis    Benign neoplasm of skin, site unspecified    Cold sensitivity    Vitamin D Deficiency    Grave's disease    Gastroesophageal reflux disease    Right foot pain    Dysmenorrhea    Acquired hypothyroidism    Insomnia    Sebaceous cyst    Depression    Low back pain    Fatigue    Palpitations    Skin lesion    Pre-operative examination    Hearing loss    Cerumen impaction    Abdominal pain    Right knee pain    Routine lab draw    Dyspepsia    Intestinal gas excretion    Gluten intolerance    Tension headache       Family HX:  none    Social Hx:   History   Substance Use Topics    Smoking status: Never Smoker     Smokeless tobacco: Never Used    Alcohol Use: Yes      Comment: 1 bottle wine /week         PE:    Vitals:Blood pressure 120/70, pulse 84, temperature 97.5 F (36.4 C), temperature source Temporal Artery, weight 74.844 kg (165 lb), SpO2 98.00%.Body mass index is 23.68  kg/(m^2).  Alert pleasant looks a little tired  Neck supple no tender lymph nodes  TM b/l intact w no erythema  CV:  Regular rate and rhythm. Normal S1S2. No rubs, murmurs or gallops.  Pulm:  Good breath sounds.  Clear to auscultation bilaterally.  No wheezing, rales or rhonchi.     Assessment/Plan:  Brittany [name] is a 48 year old female .  Conditions addressed, diagnoses and treatments ordered as noted below:      ICD-9-CM   1. Viral syndrome 079.99     Viral syndrome resolving w some lassitude.  Ok to continue w flonase and mucinex.  Rest and plenty of fluids.  No abx needed.    Patient to return to the clinic for follow-up in prn.    See AVS for further details of medications, tests ordered and patient instructions.    Barriers to  Learning assessed: none.   Patient verbalizes understanding of teaching and instructions and is agreeable to above plan.      Patient's medications at the end of this visit:    cetirizine (ZYRTEC) 10 MG tablet Take 10 mg by mouth daily.   cyclobenzaprine (FLEXERIL) 5 MG tablet Take 1 tablet by mouth 3 times daily as needed for Muscle Spasms.   fluticasone propionate (FLONASE) 50 MCG/ACT nasal spray Spray 1 spray into each nostril 2 times daily.   ibuprofen (MOTRIN) 800 MG tablet Take 1 tablet by mouth every 8 hours as needed for Mild Pain (Pain Score 1-3) or Moderate Pain (Pain Score 4-6).   levonorgestrel-ethinyl estradiol (AVIANE) 0.1-20 MG-MCG tablet Take 1 tablet by mouth daily.   levothyroxine (SYNTHROID) 112 MCG tablet Take 1 tablet by mouth every other day.   levothyroxine (SYNTHROID) 125 MCG tablet Take 1 tablet by mouth every other day.   omeprazole (PRILOSEC OTC) 20 MG EC tablet Take 20 mg by mouth daily.   tretinoin (RETIN-A) 0.025 % cream Apply a thin layer at bedtime as directed   zolpidem (AMBIEN) 10 MG tablet Take 1 tablet by mouth nightly as needed for Insomnia.

## 2013-06-08 NOTE — Patient Instructions (Signed)
Continue with Flonase and mucinex

## 2013-08-20 ENCOUNTER — Other Ambulatory Visit (INDEPENDENT_AMBULATORY_CARE_PROVIDER_SITE_OTHER): Payer: Self-pay | Admitting: Internal Medicine

## 2013-08-20 DIAGNOSIS — E039 Hypothyroidism, unspecified: Principal | ICD-10-CM

## 2013-08-20 MED ORDER — LEVOTHYROXINE SODIUM 125 MCG OR TABS
125.0000 ug | ORAL_TABLET | ORAL | Status: DC
Start: 2013-08-20 — End: 2014-02-24

## 2013-08-20 MED ORDER — LEVOTHYROXINE SODIUM 112 MCG OR TABS
112.0000 ug | ORAL_TABLET | ORAL | Status: DC
Start: 2013-08-20 — End: 2014-02-24

## 2013-08-20 NOTE — Telephone Encounter (Addendum)
levothyroxine (SYNTHROID) 125 MCG tablet  levothyroxine (SYNTHROID) 112 MCG tablet    Date of last refill: 09/17/12  Date of last office visit: 06/08/13  Future Office Visit: 09/21/13  Pertinent Labs:  Lab Results   Component Value Date    TSH 2.99 03/25/2013    TSH <0.03 03/03/2008    FREET4 1.41 03/25/2013    T3 0.9 11/13/2011       Order pended & routed to PCP for approval/signature

## 2013-08-23 NOTE — Telephone Encounter (Signed)
Pt notified via vm

## 2013-09-15 ENCOUNTER — Other Ambulatory Visit: Payer: BC Managed Care – PPO | Attending: Internal Medicine

## 2013-09-15 ENCOUNTER — Encounter: Payer: Self-pay | Admitting: Hospital

## 2013-09-15 DIAGNOSIS — E039 Hypothyroidism, unspecified: Principal | ICD-10-CM | POA: Insufficient documentation

## 2013-09-15 LAB — TSH, BLOOD: TSH: 2.41 u[IU]/mL (ref 0.27–4.20)

## 2013-09-15 LAB — LIPID(CHOL FRACT) PANEL, BLOOD
Cholesterol: 255 mg/dL — ABNORMAL HIGH (ref <200)
HDL-Cholesterol: 50 mg/dL
LDL-Chol (Calc): 187 mg/dL — ABNORMAL HIGH (ref <160)
Non-HDL Cholesterol: 205 mg/dL
Triglycerides: 89 mg/dL (ref 10–170)

## 2013-09-15 LAB — FREE THYROXINE, BLOOD: Free T4: 1.57 ng/dL (ref 0.93–1.70)

## 2013-09-21 ENCOUNTER — Encounter (INDEPENDENT_AMBULATORY_CARE_PROVIDER_SITE_OTHER): Payer: Self-pay | Admitting: Internal Medicine

## 2013-09-21 ENCOUNTER — Other Ambulatory Visit: Payer: BC Managed Care – PPO | Attending: Internal Medicine | Admitting: Internal Medicine

## 2013-09-21 ENCOUNTER — Encounter: Payer: Self-pay | Admitting: Hospital

## 2013-09-21 VITALS — BP 108/66 | HR 95 | Temp 98.4°F | Resp 16 | Wt 177.0 lb

## 2013-09-21 DIAGNOSIS — M545 Low back pain, unspecified: Secondary | ICD-10-CM | POA: Insufficient documentation

## 2013-09-21 DIAGNOSIS — J309 Allergic rhinitis, unspecified: Secondary | ICD-10-CM

## 2013-09-21 DIAGNOSIS — Z0000 Encounter for general adult medical examination without abnormal findings: Secondary | ICD-10-CM

## 2013-09-21 DIAGNOSIS — E039 Hypothyroidism, unspecified: Secondary | ICD-10-CM | POA: Insufficient documentation

## 2013-09-21 DIAGNOSIS — E785 Hyperlipidemia, unspecified: Secondary | ICD-10-CM | POA: Insufficient documentation

## 2013-09-21 DIAGNOSIS — G4700 Insomnia, unspecified: Secondary | ICD-10-CM

## 2013-09-21 DIAGNOSIS — Z0189 Encounter for other specified special examinations: Secondary | ICD-10-CM

## 2013-09-21 DIAGNOSIS — K219 Gastro-esophageal reflux disease without esophagitis: Secondary | ICD-10-CM

## 2013-09-21 LAB — CRP HS, BLOOD: CRP HS: 0.4 mg/L (ref <5)

## 2013-09-21 MED ORDER — CYCLOBENZAPRINE HCL 5 MG OR TABS
5.0000 mg | ORAL_TABLET | Freq: Three times a day (TID) | ORAL | Status: DC | PRN
Start: 2013-09-21 — End: 2015-06-10

## 2013-09-21 NOTE — Addendum Note (Signed)
Addended by: Monia Pouch on: 09/21/2013 08:54 AM     Modules accepted: Orders

## 2013-09-21 NOTE — Patient Instructions (Signed)
Your lab tests should be done fasting.  These should be done 1 week before your next visit.

## 2013-09-21 NOTE — Interdisciplinary (Signed)
AVS instructions given to patient  post clinic visit with good understanding.

## 2013-09-21 NOTE — Progress Notes (Signed)
The patient returns for followup of her hypothyroidism, GERD, insomnia, and allergic rhinitis. She states she is doing well with no symptoms of hyper or hypothyroidism, and no reflux symptoms despite having discontinued omeprazole. She notes that her insomnia is in good control and her allergic rhinitis has improved with Flonase. Her recent labs show hyperlipidemia, but she is reluctant to start statins. She doesn't have to some indiscretion and diet and is willing to watch her diet and exercise more regularly.    Past Medical History   Diagnosis Date    Infectious mononucleosis 08/2006    Hypothyroidism      h/o hyperthyroidism s/p radioactive iodine    Insomnia     Menorrhagia        Current Outpatient Prescriptions   Medication Sig    cetirizine (ZYRTEC) 10 MG tablet Take 10 mg by mouth daily.    cyclobenzaprine (FLEXERIL) 5 MG tablet Take 1 tablet by mouth 3 times daily as needed for Muscle Spasms.    fluticasone propionate (FLONASE) 50 MCG/ACT nasal spray Spray 1 spray into each nostril 2 times daily.    ibuprofen (MOTRIN) 800 MG tablet Take 1 tablet by mouth every 8 hours as needed for Mild Pain (Pain Score 1-3) or Moderate Pain (Pain Score 4-6).    levonorgestrel-ethinyl estradiol (AVIANE) 0.1-20 MG-MCG tablet Take 1 tablet by mouth daily.    levothyroxine (SYNTHROID) 112 MCG tablet Take 1 tablet by mouth every other day.    levothyroxine (SYNTHROID) 125 MCG tablet Take 1 tablet by mouth every other day.    omeprazole (PRILOSEC OTC) 20 MG EC tablet Take 20 mg by mouth daily.    tretinoin (RETIN-A) 0.025 % cream Apply a thin layer at bedtime as directed    zolpidem (AMBIEN) 10 MG tablet Take 1 tablet by mouth nightly as needed for Insomnia.     No current facility-administered medications for this visit.       Allergies   Allergen Reactions    Vicodin [Hydrocodone-Acetaminophen] Hallucinations       ROS--    No symptoms of hypo or hyperthyroidism: no decreased or increased weight, no feeling  cold/chilly or excessively warm, no diarrhea or constipation, no undue sweatiness, anxiety or palpitations.    The patient denies cough, chest pain, dyspnea, wheezing or hemoptysis.    Patient denies any exertional chest pain, dyspnea, palpitations, syncope, orthopnea, edema or paroxysmal nocturnal dyspnea.    The patient denies abdominal or flank pain, anorexia, nausea or vomiting, dysphagia, change in bowel habits or black or bloody stools or weight loss.    The patient denies swelling, numbness, tingling or weakness in the extremities.    PE--    She appears well, in no apparent distress.  Alert and oriented times three, pleasant and cooperative. Vital signs are as noted by the nurse.    Filed Vitals:    09/21/13 0829   BP: 108/66   Pulse: 95   Temp: 98.4 F (36.9 C)   TempSrc: Oral   Resp: 16   Weight: 80.287 kg (177 lb)   SpO2: 100%       Thyroid not palpable, not enlarged, no nodules detected.    Chest is clear, no wheezing or rales. Normal symmetric air entry throughout both lung fields. No chest wall deformities or tenderness.    S1 and S2 normal, no murmurs, clicks, gallops or rubs. Regular rate and rhythm. Chest is clear; no wheezes or rales. No edema or JVD.    The abdomen  is soft without tenderness, guarding, mass, rebound or organomegaly. Bowel sounds are normal. No CVA tenderness or inguinal adenopathy noted.    Extremities: extremities, peripheral pulses and reflexes normal, no edema, redness or tenderness in the calves or thighs, feet normal, good pulses, normal color, temperature and sensation.    Labs are reviewed and are as below:    Results for orders placed in visit on 09/15/13   TSH, BLOOD       Result Value Ref Range    TSH 2.41  0.27 - 4.20 uIU/mL   FREE THYROXINE, BLOOD       Result Value Ref Range    Free T4 1.57  0.93 - 1.70 ng/dL   LIPID(CHOL FRACT) PANEL, BLOOD       Result Value Ref Range    Cholesterol 255 (*) <200 mg/dL    HDL-Cholesterol 50      LDL-Chol (Calc) 187 (*) <160 mg/dL     Non-HDL Cholesterol 205      Triglycerides 89  10 - 170 mg/dL       A/P--      ICD-9-CM ICD-10-CM    1. Acquired hypothyroidism--the patient is euthyroid. Will continue her current dose of levothyroxine.  244.9 E03.9 TSH, BLOOD      FREE THYROXINE, BLOOD   2. Gastroesophageal reflux disease without esophagitis--will follow.  530.81 K21.9    3. Insomnia--she can continue Ambien as needed.  780.52 G47.00    4. Allergic rhinitis, unspecified allergic rhinitis type--will continue Flonase along with over-the-counter antihistamines as needed.  477.9 J30.9    5. Hyperlipidemia--will check CRP level today and repeat fasting lipids in 6 months.  272.4 E78.5 cyclobenzaprine (FLEXERIL) 5 MG tablet      LIPID(CHOL FRACT) PANEL, BLOOD

## 2014-01-10 ENCOUNTER — Encounter: Payer: Self-pay | Admitting: Internal Medicine

## 2014-01-10 DIAGNOSIS — Z1239 Encounter for other screening for malignant neoplasm of breast: Principal | ICD-10-CM

## 2014-02-04 ENCOUNTER — Ambulatory Visit
Admission: RE | Admit: 2014-02-04 | Discharge: 2014-02-04 | Disposition: A | Discharge status: Home / Self Care | Payer: BC Managed Care – PPO | Source: Ambulatory Visit | Attending: Diagnostic Radiology | Admitting: Diagnostic Radiology

## 2014-02-04 DIAGNOSIS — Z1239 Encounter for other screening for malignant neoplasm of breast: Secondary | ICD-10-CM

## 2014-02-04 DIAGNOSIS — Z01419 Encounter for gynecological examination (general) (routine) without abnormal findings: Secondary | ICD-10-CM

## 2014-02-04 DIAGNOSIS — Z1231 Encounter for screening mammogram for malignant neoplasm of breast: Principal | ICD-10-CM | POA: Insufficient documentation

## 2014-02-14 ENCOUNTER — Ambulatory Visit (INDEPENDENT_AMBULATORY_CARE_PROVIDER_SITE_OTHER): Payer: BC Managed Care – PPO | Admitting: Dermatology

## 2014-02-23 ENCOUNTER — Other Ambulatory Visit (INDEPENDENT_AMBULATORY_CARE_PROVIDER_SITE_OTHER): Payer: Self-pay | Admitting: Internal Medicine

## 2014-02-23 DIAGNOSIS — E039 Hypothyroidism, unspecified: Principal | ICD-10-CM

## 2014-02-24 MED ORDER — LEVOTHYROXINE SODIUM 112 MCG OR TABS
112.0000 ug | ORAL_TABLET | ORAL | Status: DC
Start: 2014-02-24 — End: 2014-05-23

## 2014-02-24 MED ORDER — LEVOTHYROXINE SODIUM 125 MCG OR TABS
125.0000 ug | ORAL_TABLET | ORAL | Status: DC
Start: 2014-02-24 — End: 2014-05-23

## 2014-02-24 NOTE — Telephone Encounter (Signed)
[name], submitted for your approval and signature.  Thanks     LOV 09/21/13  NOV 03/18/14    Lab Results   Component Value Date    TSH 2.41 09/15/2013    TSH <0.03 03/03/2008

## 2014-02-24 NOTE — Telephone Encounter (Signed)
From: [name]  To: Madaline Savage., MD  Sent: 02/23/2014 9:04 PM PDT  Subject: Medication Renewal Request    Original authorizing provider: Madaline Savage, MD    [name] would like a refill of the following medications:  levothyroxine (SYNTHROID) 125 MCG tablet Madaline Savage, MD]  levothyroxine (SYNTHROID) 112 MCG tablet Madaline Savage, MD]    Preferred pharmacy: [facility] [phone] -- [address] [place], [place] [address] -- [phone] -- [phone]    Comment:

## 2014-02-24 NOTE — Interdisciplinary (Signed)
Your Personalized Risk Report    Dear [name],    When you had your most recent mammogram, you took the time to fill out the   [place]-Screening Questionnaire and tell us about your family and medical   history. Thank you for sharing this information. It allows [place] to help you   do what you can to decrease your risk of developing breast cancer.    The information you gave [place] tells [place] that you have an elevated risk of   developing breast cancer someday, compared to other women your age. This   estimated risk is separate from the mammogram results you have received.+   Please know that being at elevated risk does not mean you will be diagnosed   with breast cancer.    There are some things you can do, and we are here to help you. In the   charts below, you will find information about what parts of your history   may play a role in your risk of developing breast cancer. To learn more   about your risk and how we can help, please contact your [place] Specialist. The Breast Health Specialist can answer your questions   and help you learn more about how to stay healthy.    Again, thank you for completing the [facility] Questionnaire. We look forward   to working with you for years to come.    What about you raises your risk?*  * You have a family history of breast cancer  * You have not given birth    What about you lowers your risk?*  * You do not have a personal history of ovarian cancer  * You do not have a family history of ovarian cancer    What [facility] Can Do For You  * An [place] Specialist will be available to discuss what it   means to be at elevated risk and the things you can do to address your   risk.   * We can provide referrals to appropriate services.  * We will provide a copy of this report to your Primary Care Provider so   that you can discuss your results with him or her.    What You Can Do For Yourself*  * Do not smoke.   * Exercise 3-4 times a week.   * Maintain a healthy  weight.   * Minimize the amount of alcohol you drink.   * Talk to your doctor about how often you should get a mammogram or other   breast cancer screening test.    * For more information, please contact the [place] Specialist   or visit the [place] website at: [email or web address].  + You may also have been notified that you have dense breasts on your   mammogram report. Dense breast tissue is common and is not abnormal.   Currently, this Risk Report does not take breast density into account.   Other resources to learn more about density include your providers or your   [place] Specialist.

## 2014-02-28 ENCOUNTER — Other Ambulatory Visit: Payer: Self-pay | Attending: Family

## 2014-02-28 ENCOUNTER — Other Ambulatory Visit (INDEPENDENT_AMBULATORY_CARE_PROVIDER_SITE_OTHER): Payer: BC Managed Care – PPO | Admitting: Family

## 2014-02-28 DIAGNOSIS — Z0000 Encounter for general adult medical examination without abnormal findings: Principal | ICD-10-CM

## 2014-02-28 DIAGNOSIS — Z23 Encounter for immunization: Secondary | ICD-10-CM

## 2014-03-02 LAB — RUBELLA IGG ANTIBODY, BLOOD: Rubella IGG Ab (EIA): POSITIVE — AB

## 2014-03-02 LAB — QUANTIFERON-TB, BLOOD
Quantiferon TB: NEGATIVE
TB Antigen - Nil: -0.001 IU/mL

## 2014-03-02 LAB — MUMPS IGG ANTIBODY, BLOOD: Mumps IGG Ab: POSITIVE — AB

## 2014-03-02 LAB — MEASLES IGG ANTIBODY, BLOOD: Measles IGG Ab: POSITIVE — AB

## 2014-03-04 LAB — VARICELLA IGG ANTIBODY (ELISA), BLOOD: Varicella Zoster IgG Ab: NEGATIVE

## 2014-03-15 NOTE — Interdisciplinary (Signed)
Breast Health Specialist Consultation Note - Provider    Patient Name: Brittany [name]  MRN: [id number]  DOB: 14-Oct-1965  Date of Consultation: 03/11/2014  Breast Health Specialist: Arva Chafe, MS, [facility]  Referring Provider: Avel Peace. [place]    Patient Risk Status:  ====================    [X]  Elevated:     [X]  Meets USPSTF guidelines for genetic counseling    [X]  Meets NCCN guidelines for genetic counseling    [ ]  Risk status changed after review of patient reported information   during         consultation     Baker Janus:* 5 year risk of breast cancer: 1.79%; Lifetime risk of breast   cancer: 17.32%;        Patient Risk Factors:  =====================    [X]  Personal risk factors for breast cancer: Ashkenazi Jewish ancestry;   Has        not given birth;    [ ]  Patient history of cancer (breast or ovarian): None    [X]  Family history of cancer (breast or ovarian): Mother, Invasive, BC   Age:        48; Sister(s), DCIS, DCIS Age: 92; Paternal Aunt, Invasive, BC Age:   68;    [ ]  Gene status: None    [ ]  Other factors: None     Topics:                          Discussed, Interested, Not Interested,   Undecided, N/A  ===========================================================================  =====    Preventive medication:            [ ]       [ ]        [ ]       [ ]       [   ]    Risk-reducing surgery:            [ ]       [ ]        [ ]       [ ]       [   ]    Genetic counseling:               [X]       [ ]        [ ]       [X]       [   ]    Exercise/weight management:       [ ]       [ ]        [ ]       [ ]       [   ]    Reducing alcohol intake:          [ ]       [ ]        [ ]       [ ]       [   ]    Smoking cessation:                [ ]       [ ]        [ ]       [ ]       [   ]    Stop hormone replacement therapy: [ ]       [ ]        [ ]       [ ]       [   ]  Environmental risk factors:       [ ]       [ ]        [ ]       [ ]       [   ]     Screening recommendations:        [X]       [ ]        [ ]       [ ]         [X]        Referrals:  ==========    [X]  Genetic counseling    [ ]  Breast cancer prevention program    [ ]  GYN/Oncology    [ ]  PCP    [ ]  Other     Next Steps:   ===========    [ ]  Breast Health Specialist or other [facility] follow up in future    [ ]  Follow up appointment with Breast Health Specialist scheduled    [ ]  Breast Health Specialist recommends genetic counseling for patient    [X]  Breast Health Specialist recommends genetic counseling for family   members      [ ]  Continue current management plan       [ ]  Patient will obtain additional records/information     [ ]  Patient to discuss recommendations with provider    [ ]  Patient to schedule appointment with suggested referral    [ ]  Patient to determine what previous genetic testing was completed    [ ]  Other       Summary/Recommendations/Additional Notes for Provider:   ======================================================    It was a pleasure speaking with your patient, Brittany [name], regarding her   breast    cancer risk as it relates to the responses she shared on her [facility]   Intake    Questionnaire at the time of her mammogram. She was identified due to her    family history of breast cancer and Ashkenazi Jewish ancestry, which put   her    at an elevated risk to carry an inherited susceptibility to breast cancer   due    to the BRCA1/2 genes. Brittany [name] has 5 sisters, one of whom was diagnosed   with    DCIS at age 44. Her mother was diagnosed with breast cancer at age 36 and   had    a recurrence in her 20s. She is surviving at age 33. She has one paternal   aunt    who was diagnosed with breast cancer at age 41.Only about 5-10% of breast    cancer is associated in a mutation in the BRCA genes, but mutations are   more    common in the [place] population (about 2% of individuals with   Ashkenazi    Jewish ancestry carry a mutation). Brittany [name] clarified that the suspects   some    distant Ashkenazi Jewish ancestry in her paternal lineage. Based on her    mother?s age at  diagnosis alone, the family still meets NCCN criteria for    consideration of BRCA1/2 genetic testing and counseling; we reviewed that   it    is most informative for an affected relative to be the first to undergo    genetic testing. [name] stated her mother has too much going on with   other    medical issues and she doesn?t want to bring this up to her. I suggested    Brittany [name] reach out to her sister to see if this has  been done or if she is    interested. She may find a Dietitian at [email or web address] under "find a    Dietitian" by zipcode. If a mutation is identified, Brittany [name] should    be seen in the [employer] Program to discuss testing.Brittany [name]    inquired about the recent controversy surrounding breast screening    (conflicting medical guidelines about initiation and frequency) and a   recent    article about overtreatment of DCIS. I explained that some DCIS   (specifically    low grade) is through to have a low chance of progressing or recurring as    invasive malignancy, while other DCIS is associated with high chance of    progression or recurrence as invasive cancer. There is currently   controversy    over potential over-treatment of DCIS, however to date there is not   enough    evidence and no guidelines to specify what features or circumstances   warrant    less vs more treatment. The hope is more evidence will emerge in the next   few    years to help guide treatment decisions. With the conflicting guidelines   about    the optimal time and interval for breast cancer screening, the choice of   how    often and when to screen should be a shared decision making process and    personalized based on risk and preference. With her family history and    heterogeneously dense breast tissue, Brittany [name] has a moderately increased   risk to    develop breast cancer (1.5-2.5x population risk based on Baker Janus and IBIS   models)    and she may benefit from annual screening rather than every two years,   but  in    the absence of a gene mutation she could consider every other year if she    prefers to. I will remain available to her should she have questions in   the    future. Thank you.    Thank you for the opportunity to participate in the care of this patient.  Please feel free to contact Arva Chafe, MS, [facility] with any questions at   [phone] or [email or web address]    *For information about risk models and genetic counseling guidelines go to   [email or web address].

## 2014-03-18 ENCOUNTER — Encounter (INDEPENDENT_AMBULATORY_CARE_PROVIDER_SITE_OTHER): Payer: BC Managed Care – PPO | Admitting: Internal Medicine

## 2014-03-18 ENCOUNTER — Ambulatory Visit (INDEPENDENT_AMBULATORY_CARE_PROVIDER_SITE_OTHER): Payer: Self-pay | Admitting: Occupational Health

## 2014-03-23 ENCOUNTER — Other Ambulatory Visit (INDEPENDENT_AMBULATORY_CARE_PROVIDER_SITE_OTHER): Payer: Self-pay | Admitting: Occupational Medicine

## 2014-03-23 ENCOUNTER — Ambulatory Visit (INDEPENDENT_AMBULATORY_CARE_PROVIDER_SITE_OTHER): Payer: Self-pay | Admitting: Occupational Health

## 2014-03-23 MED ORDER — VARICELLA VIRUS VACCINE LIVE 1350 PFU/0.5ML SC INJ
0.50 mL | INJECTION | Freq: Once | SUBCUTANEOUS | Status: AC
Start: 2014-03-23 — End: 2014-03-23

## 2014-04-08 ENCOUNTER — Other Ambulatory Visit: Payer: BC Managed Care – PPO | Attending: Internal Medicine

## 2014-04-08 DIAGNOSIS — E785 Hyperlipidemia, unspecified: Principal | ICD-10-CM | POA: Insufficient documentation

## 2014-04-08 DIAGNOSIS — E039 Hypothyroidism, unspecified: Secondary | ICD-10-CM | POA: Insufficient documentation

## 2014-04-08 LAB — LIPID(CHOL FRACT) PANEL, BLOOD
Cholesterol: 241 mg/dL — ABNORMAL HIGH (ref <200)
HDL-Cholesterol: 56 mg/dL
LDL-Chol (Calc): 160 mg/dL — ABNORMAL HIGH (ref <160)
Non-HDL Cholesterol: 185 mg/dL
Triglycerides: 125 mg/dL (ref 10–170)

## 2014-04-08 LAB — FREE THYROXINE, BLOOD: Free T4: 1.82 ng/dL — ABNORMAL HIGH (ref 0.93–1.70)

## 2014-04-08 LAB — TSH, BLOOD: TSH: 1.02 u[IU]/mL (ref 0.27–4.20)

## 2014-04-11 ENCOUNTER — Ambulatory Visit (INDEPENDENT_AMBULATORY_CARE_PROVIDER_SITE_OTHER): Payer: BC Managed Care – PPO | Admitting: Internal Medicine

## 2014-04-11 VITALS — BP 117/76 | HR 86 | Temp 96.7°F | Resp 16 | Ht 70.0 in | Wt 177.0 lb

## 2014-04-11 DIAGNOSIS — K219 Gastro-esophageal reflux disease without esophagitis: Secondary | ICD-10-CM

## 2014-04-11 DIAGNOSIS — E785 Hyperlipidemia, unspecified: Secondary | ICD-10-CM

## 2014-04-11 DIAGNOSIS — E039 Hypothyroidism, unspecified: Principal | ICD-10-CM

## 2014-04-11 DIAGNOSIS — J301 Allergic rhinitis due to pollen: Secondary | ICD-10-CM

## 2014-04-11 NOTE — Patient Instructions (Signed)
Your lab tests should be done fasting.  These should be done 1 week before your next visit.

## 2014-04-11 NOTE — Progress Notes (Signed)
The patient returns for followup of her hypothyroidism, insomnia, GERD, and allergic rhinitis. She states she has been doing well with no symptoms of hyper or hypothyroidism and no reflux or insomnia. She does not some difficulty with her allergies despite taking Zyrtec on a regular basis in addition to Flonase. She has no other complaints.    Past Medical History   Diagnosis Date    Infectious mononucleosis 08/2006    Hypothyroidism      h/o hyperthyroidism s/p radioactive iodine    Insomnia     Menorrhagia        Current Outpatient Prescriptions   Medication Sig    cetirizine (ZYRTEC) 10 MG tablet Take 10 mg by mouth daily.    cyclobenzaprine (FLEXERIL) 5 MG tablet Take 1 tablet by mouth 3 times daily as needed for Muscle Spasms.    fluticasone propionate (FLONASE) 50 MCG/ACT nasal spray Spray 1 spray into each nostril 2 times daily.    ibuprofen (MOTRIN) 800 MG tablet Take 1 tablet by mouth every 8 hours as needed for Mild Pain (Pain Score 1-3) or Moderate Pain (Pain Score 4-6).    levonorgestrel-ethinyl estradiol (AVIANE) 0.1-20 MG-MCG tablet Take 1 tablet by mouth daily.    levothyroxine (SYNTHROID) 112 MCG tablet Take 1 tablet (112 mcg) by mouth every other day.    levothyroxine (SYNTHROID) 125 MCG tablet Take 1 tablet (125 mcg) by mouth every other day.    omeprazole (PRILOSEC OTC) 20 MG EC tablet Take 20 mg by mouth daily.    tretinoin (RETIN-A) 0.025 % cream Apply a thin layer at bedtime as directed    zolpidem (AMBIEN) 10 MG tablet Take 1 tablet by mouth nightly as needed for Insomnia.     No current facility-administered medications for this visit.       Allergies   Allergen Reactions    Vicodin [Hydrocodone-Acetaminophen] Hallucinations       ROS--    No symptoms of hypo or hyperthyroidism: no decreased or increased weight, no feeling cold/chilly or excessively warm, no diarrhea or constipation, no undue sweatiness, anxiety or palpitations.    The patient denies cough, chest pain, dyspnea,  wheezing or hemoptysis.    Patient denies any exertional chest pain, dyspnea, palpitations, syncope, orthopnea, edema or paroxysmal nocturnal dyspnea.    The patient denies abdominal or flank pain, anorexia, nausea or vomiting, dysphagia, change in bowel habits or black or bloody stools or weight loss.    The patient denies swelling, numbness, tingling or weakness in the extremities.    PE--    She appears well, in no apparent distress.  Alert and oriented times three, pleasant and cooperative. Vital signs are as noted by the nurse.    Filed Vitals:    04/11/14 0942   BP: 117/76   Pulse: 86   Temp: 96.7 F (35.9 C)   TempSrc: Oral   Resp: 16   Height: 5\' 10"  (1.778 m)   Weight: 80.287 kg (177 lb)   SpO2: 95%       Thyroid not palpable, not enlarged, no nodules detected.    Chest is clear, no wheezing or rales. Normal symmetric air entry throughout both lung fields. No chest wall deformities or tenderness.    S1 and S2 normal, no murmurs, clicks, gallops or rubs. Regular rate and rhythm. Chest is clear; no wheezes or rales. No edema or JVD.    The abdomen is soft without tenderness, guarding, mass, rebound or organomegaly. Bowel sounds are normal. No CVA  tenderness or inguinal adenopathy noted.    Extremities: extremities, peripheral pulses and reflexes normal, no edema, redness or tenderness in the calves or thighs, feet normal, good pulses, normal color, temperature and sensation.    Labs are reviewed and are as below;    Results for orders placed or performed in visit on 04/08/14   LIPID(CHOL FRACT) PANEL, BLOOD   Result Value Ref Range    Cholesterol 241 (H) <200 mg/dL    HDL-Cholesterol 56 mg/dL    LDL-Chol (Calc) 160 (H) <160 mg/dL    Non-HDL Cholesterol 185 mg/dL    Triglycerides 125 10 - 170 mg/dL   TSH, BLOOD   Result Value Ref Range    TSH 1.02 0.27 - 4.20 uIU/mL   FREE THYROXINE, BLOOD   Result Value Ref Range    Free T4 1.82 (H) 0.93 - 1.70 ng/dL       A/P--      ICD-10-CM ICD-9-CM    1. Acquired  hypothyroidism--the patient is euthyroid and will continue her current dose of levothyroxine.  E03.9 244.9 TSH, BLOOD      FREE THYROXINE, BLOOD   2. Hyperlipidemia--LDL is borderline. She was counseled on diet and exercise and will recheck in 6 months.  E78.5 272.4 LIPID(CHOL FRACT) PANEL, BLOOD      LIVER PANEL, BLOOD   3. Allergic rhinitis due to pollen--she will try Allegra or Claritin instead of her Zyrtec and continue Flonase.  J30.1 477.0    4. Gastroesophageal reflux disease without esophagitis--continue current treatment.  K21.9 530.81

## 2014-04-11 NOTE — Interdisciplinary (Signed)
AVS given to pt. Instructions reviewed, agreed and verbalized understanding.

## 2014-04-22 ENCOUNTER — Other Ambulatory Visit (INDEPENDENT_AMBULATORY_CARE_PROVIDER_SITE_OTHER): Payer: Self-pay | Admitting: Occupational Medicine

## 2014-05-23 ENCOUNTER — Other Ambulatory Visit (INDEPENDENT_AMBULATORY_CARE_PROVIDER_SITE_OTHER): Payer: Self-pay | Admitting: Internal Medicine

## 2014-05-23 DIAGNOSIS — E039 Hypothyroidism, unspecified: Principal | ICD-10-CM

## 2014-05-23 NOTE — Telephone Encounter (Signed)
Routing to correct point of person.

## 2014-05-24 MED ORDER — SYNTHROID 112 MCG OR TABS
ORAL_TABLET | ORAL | Status: DC
Start: 2014-05-24 — End: 2014-11-12

## 2014-05-24 MED ORDER — SYNTHROID 125 MCG OR TABS
ORAL_TABLET | ORAL | Status: DC
Start: 2014-05-24 — End: 2014-11-12

## 2014-05-24 NOTE — Telephone Encounter (Signed)
Last visit 04/11/2014.  Next visit  10/06/2014.  Last filled  02/24/2014.  Lab Results   Component Value Date    TSH 1.02 04/08/2014    TSH <0.03 03/03/2008

## 2014-05-30 ENCOUNTER — Other Ambulatory Visit (INDEPENDENT_AMBULATORY_CARE_PROVIDER_SITE_OTHER): Payer: Self-pay | Admitting: Obstetrics & Gynecology

## 2014-05-30 DIAGNOSIS — N920 Excessive and frequent menstruation with regular cycle: Principal | ICD-10-CM

## 2014-05-30 MED ORDER — LEVONORGESTREL-ETHINYL ESTRAD 0.1-20 MG-MCG OR TABS
1.0000 | ORAL_TABLET | Freq: Every day | ORAL | Status: DC
Start: 2014-05-30 — End: 2015-07-27

## 2014-05-30 NOTE — Telephone Encounter (Signed)
Dr. [name],    Received a fax from the pharmacy pt would like refills on Aviane. Pt was last seen on 04/29/13. Please review and advise if ok to fill. Thank you.

## 2014-10-06 ENCOUNTER — Encounter (INDEPENDENT_AMBULATORY_CARE_PROVIDER_SITE_OTHER): Payer: BC Managed Care – PPO | Admitting: Internal Medicine

## 2014-10-10 ENCOUNTER — Other Ambulatory Visit (INDEPENDENT_AMBULATORY_CARE_PROVIDER_SITE_OTHER): Payer: Self-pay | Admitting: Internal Medicine

## 2014-10-10 DIAGNOSIS — M549 Dorsalgia, unspecified: Principal | ICD-10-CM

## 2014-10-11 NOTE — Telephone Encounter (Signed)
Last office visit:04/11/2014  Next office visit:11/28/2014  Last refill:03/31/2013  Please accept or decline Rx order.

## 2014-10-11 NOTE — Telephone Encounter (Signed)
Med last rxed in 2014.  Why is patient needing this med and how often is pt taking this? Advise pt to make fu apptmt PCP or any provider to discuss need for this med.

## 2014-10-12 NOTE — Telephone Encounter (Signed)
Called pt, no answer. Left msg on vm to call us back to clarify med request. Routing to correct point person.

## 2014-10-13 NOTE — Telephone Encounter (Signed)
Attempted to call the pt. by phone with the following result;  left message on machine to call back.  Please ask the patient how often does she takes the Ibuprofen? Thank you.

## 2014-10-13 NOTE — Telephone Encounter (Signed)
Pt returned call below.    States she needs refill due to menstrual cramps and currently recovering from an oral surgery.     Pt has future appt for CPE with Dr Sammuel Hines on 11/28/14 @ 2:00PM. Please note.

## 2014-10-14 NOTE — Telephone Encounter (Signed)
Patient called back and informed that Dr Sammuel Hines is not here today. I asked patient that Covering MD would like to know how often does she takes this medication. Per patient, she takes Ibuprofen for Menstrual cramps and is going on vacation.  Patient has an appt to see Dr Sammuel Hines on 11/28/2014.  She said that 30 pills will be enough.  Dr Barbee Cough, pending for your approval if okay. Thanks.

## 2014-10-18 MED ORDER — IBUPROFEN 800 MG OR TABS
ORAL_TABLET | ORAL | Status: DC
Start: 2014-10-18 — End: 2015-06-10

## 2014-11-12 ENCOUNTER — Other Ambulatory Visit (INDEPENDENT_AMBULATORY_CARE_PROVIDER_SITE_OTHER): Payer: Self-pay | Admitting: Internal Medicine

## 2014-11-12 DIAGNOSIS — E039 Hypothyroidism, unspecified: Principal | ICD-10-CM

## 2014-11-14 MED ORDER — SYNTHROID 112 MCG OR TABS
ORAL_TABLET | ORAL | 0 refills | Status: DC
Start: 2014-11-14 — End: 2015-02-09

## 2014-11-14 MED ORDER — SYNTHROID 125 MCG OR TABS
ORAL_TABLET | ORAL | 0 refills | Status: DC
Start: 2014-11-14 — End: 2015-02-09

## 2014-11-14 NOTE — Telephone Encounter (Signed)
Last visit  -  04/11/2014.  Next visit -  11/27/2013.  Last filled -  05/24/2014.  Lab Results   Component Value Date    TSH 1.02 04/08/2014    TSH <0.03 03/03/2008

## 2014-11-25 ENCOUNTER — Other Ambulatory Visit: Payer: BC Managed Care – PPO | Attending: Internal Medicine

## 2014-11-25 DIAGNOSIS — E785 Hyperlipidemia, unspecified: Secondary | ICD-10-CM | POA: Insufficient documentation

## 2014-11-25 DIAGNOSIS — E039 Hypothyroidism, unspecified: Principal | ICD-10-CM | POA: Insufficient documentation

## 2014-11-25 LAB — LIPID(CHOL FRACT) PANEL, BLOOD
Cholesterol: 224 mg/dL — ABNORMAL HIGH (ref <200)
HDL-Cholesterol: 53 mg/dL
LDL-Chol (Calc): 148 mg/dL (ref <160)
Non-HDL Cholesterol: 171 mg/dL
Triglycerides: 115 mg/dL (ref 10–170)

## 2014-11-25 LAB — LIVER PANEL, BLOOD
ALT (SGPT): 20 U/L (ref 0–33)
AST (SGOT): 20 U/L (ref 0–32)
Albumin: 4.1 g/dL (ref 3.5–5.2)
Alkaline Phos: 36 U/L (ref 35–140)
Bilirubin, Dir: 0.2 mg/dL (ref <0.2)
Bilirubin, Tot: 1.54 mg/dL — ABNORMAL HIGH (ref <1.20)
Total Protein: 6.5 g/dL (ref 6.0–8.0)

## 2014-11-25 LAB — TSH, BLOOD: TSH: 1.08 u[IU]/mL (ref 0.27–4.20)

## 2014-11-25 LAB — FREE THYROXINE, BLOOD: Free T4: 1.82 ng/dL — ABNORMAL HIGH (ref 0.93–1.70)

## 2014-11-28 ENCOUNTER — Ambulatory Visit (INDEPENDENT_AMBULATORY_CARE_PROVIDER_SITE_OTHER): Payer: BC Managed Care – PPO | Admitting: Internal Medicine

## 2014-11-28 ENCOUNTER — Encounter (INDEPENDENT_AMBULATORY_CARE_PROVIDER_SITE_OTHER): Payer: BC Managed Care – PPO | Admitting: Internal Medicine

## 2014-11-28 ENCOUNTER — Encounter (INDEPENDENT_AMBULATORY_CARE_PROVIDER_SITE_OTHER): Payer: Self-pay | Admitting: Internal Medicine

## 2014-11-28 VITALS — BP 113/67 | HR 79 | Temp 98.1°F | Resp 18 | Ht 70.0 in | Wt 176.6 lb

## 2014-11-28 DIAGNOSIS — E0500 Thyrotoxicosis with diffuse goiter without thyrotoxic crisis or storm: Secondary | ICD-10-CM

## 2014-11-28 DIAGNOSIS — Z0000 Encounter for general adult medical examination without abnormal findings: Principal | ICD-10-CM

## 2014-11-28 NOTE — Interdisciplinary (Signed)
AVS instructions given to patient  post clinic visit with good understanding.

## 2014-11-28 NOTE — Patient Instructions (Signed)
Your lab tests should be done fasting.  These should be done 1 week before your next visit.

## 2014-11-28 NOTE — Progress Notes (Signed)
The patient returns for her complete annual physical examination. She states she has no significant illnesses or complaints. She does note that her sister was recently diagnosed with Crohn's disease, but she has had no symptoms except for very transient gastroenteritis which resolved. She is having no diarrhea, hematochezia, or any other symptoms presently.    Past medical history:    Past Medical History   Diagnosis Date    Hypothyroidism      h/o hyperthyroidism s/p radioactive iodine    Infectious mononucleosis 08/2006    Insomnia     Menorrhagia        Current Outpatient Prescriptions   Medication Sig    cetirizine (ZYRTEC) 10 MG tablet Take 10 mg by mouth daily.    cyclobenzaprine (FLEXERIL) 5 MG tablet Take 1 tablet by mouth 3 times daily as needed for Muscle Spasms.    fluticasone propionate (FLONASE) 50 MCG/ACT nasal spray Spray 1 spray into each nostril 2 times daily.    ibuprofen (MOTRIN) 800 MG tablet TAKE 1 TABLET BY MOUTH EVERY 8 HOURS AS NEEDED FOR MILD PAIN (PAIN SCORE 1-3) OR MODERATE PAIN (SCORE 4-6)    levonorgestrel-ethinyl estradiol (AVIANE) 0.1-20 MG-MCG tablet Take 1 tablet by mouth daily.    omeprazole (PRILOSEC OTC) 20 MG EC tablet Take 20 mg by mouth daily.    SYNTHROID 112 MCG tablet TAKE 1 TABLET (112 MCG) BY MOUTH EVERY OTHER DAY.    SYNTHROID 125 MCG tablet TAKE 1 TABLET (125 MCG) BY MOUTH EVERY OTHER DAY.    tretinoin (RETIN-A) 0.025 % cream Apply a thin layer at bedtime as directed    zolpidem (AMBIEN) 10 MG tablet Take 1 tablet by mouth nightly as needed for Insomnia.     No current facility-administered medications for this visit.        Allergies   Allergen Reactions    Vicodin [Hydrocodone-Acetaminophen] Hallucinations       Family history:    Family History   Problem Relation Age of Onset    Breast Cancer Mother     Other Sister      Uterine fibroids/hysterectomy    Breast Cancer Sister     Ovarian Cancer Neg Hx      but sister w/ovarian cysts age 64        Social history:    Social History   Substance Use Topics    Smoking status: Never Smoker    Smokeless tobacco: Never Used    Alcohol use Yes      Comment: 1 bottle wine /week       Review of systems:     The patient denies changes in visual acuity, diplopia or amaurosis, no discharge, matting, redness, tearing or eye pain.    She denies a history of hearing loss, ear pain, tinnitus or aural discharge.    Patient denies sore throat, dental pain, hoarseness, dysphagia, oral or tongue lesions.    No pain or swelling in the neck. No swollen glands. No pain with motion of the neck.    No symptoms of hypo or hyperthyroidism: no decreased or increased weight, no feeling cold/chilly or excessively warm, no diarrhea or constipation, no undue sweatiness, anxiety or palpitations.    The patient denies cough, chest pain, dyspnea, wheezing or hemoptysis.    Patient denies any exertional chest pain, dyspnea, palpitations, syncope, orthopnea, edema or paroxysmal nocturnal dyspnea.    The patient denies abdominal or flank pain, anorexia, nausea or vomiting, dysphagia, change in bowel habits or  black or bloody stools or weight loss.    The patient denies swelling, numbness, tingling or weakness in the extremities.    She denies symptoms of joint pain, swelling, myalgias or back pain.    She has noted no swollen glands in neck, axillary or inguinal areas, no fevers, weight loss, night sweats or pruritus. There is no family history of lymphoreticular neoplasia.    The patient denies dysuria, frequency or hematuria.    The patient denies any symptoms of neurological impairment or TIAs; no amaurosis, diplopia, dysphasia, or unilateral disturbance of motor or sensory function. No loss of balance or vertigo.      PE--    She appears well, in no apparent distress.  Alert and oriented times three, pleasant and cooperative. Vital signs are as noted by the nurse.    Vitals:    11/28/14 0736   BP: 113/67   Pulse: 79   Resp: 18   Temp:  98.1 F (36.7 C)   TempSrc: Oral   SpO2: 99%   Weight: 80.1 kg (176 lb 9.6 oz)   Height: 5\' 10"  (1.778 m)       Eye exam is normal - PERLA, EOMI, fundi normal, corneas normal, no foreign bodies, visual acuity normal both eyes.    Ear exam - both sides normal, TM intact without perforation or effusion, external canal normal. No significant ceruminosis noted.    Throat exam normal. Oral cavity, tongue, pharynx and palate have no inflammation or suspicious lesions. Tonsils - tonsils are present and normal. Teeth normal without tenderness.    The neck is supple and free of adenopathy or masses, the thyroid is normal without enlargement or nodules.    Thyroid not palpable, not enlarged, no nodules detected.    Chest is clear, no wheezing or rales. Normal symmetric air entry throughout both lung fields. No chest wall deformities or tenderness.    S1 and S2 normal, no murmurs, clicks, gallops or rubs. Regular rate and rhythm. Chest is clear; no wheezes or rales. No edema or JVD.    The abdomen is soft without tenderness, guarding, mass, rebound or organomegaly. Bowel sounds are normal. No CVA tenderness or inguinal adenopathy noted.    Extremities: extremities, peripheral pulses and reflexes normal, no edema, redness or tenderness in the calves or thighs, feet normal, good pulses, normal color, temperature and sensation.    A general joint exam is normal with full range of motion of spine, shoulders, elbows, wrists, fingers, hips, knees and ankles; no active swelling, tenderness or synovitis at any joint. No soft tissue nodules.    Cranial nerves are normal. Fundi are normal with sharp disc margins, no papilledema, hemorrhages or exudates noted. PERLA. EOMs intact. Neck supple. No cranial or carotid bruits.  Good carotid upstroke.  DTRs, motor power and sensation normal and symmetric. Babinski sign absent.  Mental status normal.  Gait and station normal.  Cerebellar function is normal.    Labs are reviewed and are as  below:    Results for orders placed or performed in visit on 11/25/14   LIPID(CHOL FRACT) PANEL, BLOOD   Result Value Ref Range    Cholesterol 224 (H) <200 mg/dL    HDL-Cholesterol 53 mg/dL    LDL-Chol (Calc) 148 <160 mg/dL    Non-HDL Cholesterol 171 mg/dL    Triglycerides 115 10 - 170 mg/dL   LIVER PANEL, BLOOD   Result Value Ref Range    Total Protein 6.5 6.0 - 8.0 g/dL  Albumin 4.1 3.5 - 5.2 g/dL    Bilirubin, Dir 0.2 <0.2 mg/dL    Bilirubin, Tot 1.54 (H) <1.20 mg/dL    AST (SGOT) 20 0 - 32 U/L    ALT (SGPT) 20 0 - 33 U/L    Alkaline Phos 36 35 - 140 U/L   TSH, BLOOD   Result Value Ref Range    TSH 1.08 0.27 - 4.20 uIU/mL   FREE THYROXINE, BLOOD   Result Value Ref Range    Free T4 1.82 (H) 0.93 - 1.70 ng/dL       A/P--      ICD-10-CM ICD-9-CM    1. Preventative health care--normal physical examination.  Z00.00 V70.0

## 2015-02-09 ENCOUNTER — Other Ambulatory Visit (INDEPENDENT_AMBULATORY_CARE_PROVIDER_SITE_OTHER): Payer: Self-pay | Admitting: Internal Medicine

## 2015-02-09 DIAGNOSIS — E039 Hypothyroidism, unspecified: Principal | ICD-10-CM

## 2015-02-09 MED ORDER — SYNTHROID 125 MCG OR TABS
ORAL_TABLET | ORAL | 2 refills | Status: DC
Start: 2015-02-09 — End: 2015-06-10

## 2015-02-09 MED ORDER — SYNTHROID 112 MCG OR TABS
ORAL_TABLET | ORAL | 2 refills | Status: DC
Start: 2015-02-09 — End: 2015-06-10

## 2015-02-09 NOTE — Telephone Encounter (Signed)
Last visit  -  11/28/2014.  Next visit -  None seen.  Last filled -  11/14/2014.  Lab Results   Component Value Date    TSH 1.08 11/25/2014    TSH <0.03 03/03/2008

## 2015-03-03 ENCOUNTER — Encounter (INDEPENDENT_AMBULATORY_CARE_PROVIDER_SITE_OTHER): Payer: Self-pay | Admitting: Internal Medicine

## 2015-03-03 NOTE — Telephone Encounter (Signed)
From: [name]  To: [name], MD  Sent: 03/03/2015 9:54 AM PDT  Subject: 20-Other    Please note my work phone number has changed. It is now my cell phone: [phone].  [name]

## 2015-03-03 NOTE — Telephone Encounter (Signed)
Records updated. FYI.

## 2015-03-20 ENCOUNTER — Encounter (INDEPENDENT_AMBULATORY_CARE_PROVIDER_SITE_OTHER): Payer: Self-pay | Admitting: Internal Medicine

## 2015-03-20 NOTE — Telephone Encounter (Signed)
Records updated. FYI.

## 2015-03-20 NOTE — Telephone Encounter (Signed)
From: [name]  To: [name], MD  Sent: 03/20/2015 8:31 AM PDT  Subject: 20-Other    Hello [name],  Just wanted to let you know I received the flu vaccination from [place] at the clinic that [facility] held on October 28 at [facility]. I also received the flu vaccination in 2015 from an [place] clinic at [facility].  [name]

## 2015-03-21 ENCOUNTER — Encounter (INDEPENDENT_AMBULATORY_CARE_PROVIDER_SITE_OTHER): Payer: Self-pay | Admitting: Dermatology

## 2015-03-21 ENCOUNTER — Ambulatory Visit (INDEPENDENT_AMBULATORY_CARE_PROVIDER_SITE_OTHER): Payer: BC Managed Care – PPO | Admitting: Dermatology

## 2015-03-21 DIAGNOSIS — L821 Other seborrheic keratosis: Secondary | ICD-10-CM

## 2015-03-21 DIAGNOSIS — L578 Other skin changes due to chronic exposure to nonionizing radiation: Secondary | ICD-10-CM

## 2015-03-21 DIAGNOSIS — L709 Acne, unspecified: Principal | ICD-10-CM

## 2015-03-21 DIAGNOSIS — I781 Nevus, non-neoplastic: Secondary | ICD-10-CM

## 2015-03-21 DIAGNOSIS — D1801 Hemangioma of skin and subcutaneous tissue: Secondary | ICD-10-CM

## 2015-03-21 DIAGNOSIS — L708 Other acne: Secondary | ICD-10-CM

## 2015-03-21 DIAGNOSIS — D239 Other benign neoplasm of skin, unspecified: Secondary | ICD-10-CM

## 2015-03-21 DIAGNOSIS — D229 Melanocytic nevi, unspecified: Secondary | ICD-10-CM

## 2015-03-21 MED ORDER — TRETINOIN 0.025 % EX CREA
TOPICAL_CREAM | CUTANEOUS | 3 refills | Status: AC
Start: 2015-03-21 — End: ?

## 2015-03-21 NOTE — Progress Notes (Signed)
Primary MD: [name]  Consult Requested By: [name]    HISTORY:  Yahira Timberman is a 49 year old female with h/o dermatofibroma here for TBSE.  LV with Dr. Myriam Jacobson in 2014.  Patient reports a new lesion of concern on her R calf, noted it was a pink bump but now is brown and kind of firm.   Otherwise no other itching, bleeding, painful, growing, ulcerating, or concerning lesions.     Sun Protection:  intermittent  Melanoma hx: negative   NMSC hx: negative   Family Hx of melanoma: negative   Family hx of skin disease: positive mother had 3 NMSC    The patient  has a past medical history of Hypothyroidism; Infectious mononucleosis (08/2006); Insomnia; and Menorrhagia.    Medications:   Current Outpatient Prescriptions:     cetirizine (ZYRTEC) 10 MG tablet, Take 10 mg by mouth daily., Disp: , Rfl:     cyclobenzaprine (FLEXERIL) 5 MG tablet, Take 1 tablet by mouth 3 times daily as needed for Muscle Spasms., Disp: 30 tablet, Rfl: 1    fluticasone propionate (FLONASE) 50 MCG/ACT nasal spray, Spray 1 spray into each nostril 2 times daily., Disp: 1 bottle, Rfl: 5    ibuprofen (MOTRIN) 800 MG tablet, TAKE 1 TABLET BY MOUTH EVERY 8 HOURS AS NEEDED FOR MILD PAIN (PAIN SCORE 1-3) OR MODERATE PAIN (SCORE 4-6), Disp: 30 tablet, Rfl: 0    levonorgestrel-ethinyl estradiol (AVIANE) 0.1-20 MG-MCG tablet, Take 1 tablet by mouth daily., Disp: 84 tablet, Rfl: 4    omeprazole (PRILOSEC OTC) 20 MG EC tablet, Take 20 mg by mouth daily., Disp: 30 tablet, Rfl: 5    SYNTHROID 112 MCG tablet, TAKE 1 TABLET (112 MCG) BY MOUTH EVERY OTHER DAY., Disp: 45 tablet, Rfl: 2    SYNTHROID 125 MCG tablet, TAKE 1 TABLET (125 MCG) BY MOUTH EVERY OTHER DAY., Disp: 45 tablet, Rfl: 2    tretinoin (RETIN-A) 0.025 % cream, Apply a thin layer at bedtime as directed, Disp: 1 Tube, Rfl: 1    zolpidem (AMBIEN) 10 MG tablet, Take 1 tablet by mouth nightly as needed for Insomnia., Disp: 30 tablet, Rfl: 2    Allergies: Vicodin  [hydrocodone-acetaminophen]    REVIEW OF SYSTEMS:  CONSTITUTIONAL: negative for fever or chills  DERMATOLOGIC: Feels well, no other skin complaints.    PHYSICAL EXAM:  Skin Type:                    2  General Appearance: within normal limits  Neuro: Alert and oriented x 3  Psych: Mood and affect within normal limits  Areas examined included: scalp, face, sclera, conjunctiva, eyelids, neck, RUE, LUE, fingernails,chest, abdomen, back, RLE, LLE, feet, toenails, buttock, groin.    Pertinent findings below:  -well demarcated, brown, stuck on appearing plaques on trunk and extremities  -scattered even-bordered, even-pigmented macules and papules on the trunk and extremities  -scattered 2-4 mm cherry red macules  -RLE x2 with 28mm pink brown papule with central white stellate scar and + dimple sign  -with well healed biopsy scar, no nodularity seen within scar on the back; L flank with some re-pigmentation within the scar of biopsy site from 2014 but within the scar borders and original lesion was benign nevus  -scattered comedones and inflammatory papules on the central face and chin  Dyschromia, thinning, loss of elasticity, and wrinkling of the skin in sun exposed areas    ASSESSMENT AND TREATMENT PLAN:  Seborrheic Keratosis and lentigines  - reassurance of benign nature of lesion, questions answered  - no treatment necessary today    Multiple Nevi  - reassurance of benign nature of lesion, questions answered  - no treatment necessary today  - L flank with some regrowth of benign nevus within the biopsy scar, reassured patient    Cherry Angiomas  - reassurance of benign nature of lesion, questions answered  - no treatment necessary today    Dermatofibroma x2 RLE  - reassurance of benign nature of lesion, questions answered  - no treatment necessary today    ACNE - inflammatory / comedonal  -discussed etiology of acne, treatment options and treatment expectations with questions answered  -no pregnancy while on the  following acne medications, contraception reviewed  -explained that acne medications increase sun senitivity, patient advised to wear SPF 30 daily  PM: wash face with cetaphil creamy cleanser followed by application of tretinoin cream   Begin tretinoin treatment with pea sized amount every other night working up to nightly as tolerated. Discussed possible redness/peeling/irritation and possibly worsening of acne for the first 2-4 weeks.  Can mix tretinoin with cerave cream to help with dryness.  This medication should be discontinued 1-2 weeks prior to any procedure such as laser or hair removal.      Sun Damaged Skin and Skin Health Maintenance    - Importance of annual TBSE by physician reviewed with patient, next due 02/2016  - no e/o recurrence at sites of prior biopsies/excisions  - Discussed importance of sun protection, including avoidance of sun at peak hours (10 am-4 pm), protective clothing, and use of sunscreen with SPF 30 or greater with frequent reapplication (every 1 to 1.5 hours, or more frequently if in water or if heavy sweating).  Best sunscreens use zinc or titanium oxide as the active ingredient and act as a physical blocker.   - ABCDEs of melanoma reviewed; Signs of new and recurrent NMSC discussed with patient     1 year TBSE    Marguerite Olea, MD, PhD

## 2015-03-21 NOTE — Patient Instructions (Signed)
DERMATOLOGY    MOLES AND MELANOMAS      Moles (nevi) are tan or brown, raised or flat areas of the skin which have an increased number of melanocytes. Melanocytes are the cells in our body which make pigment and account for skin color.    Some moles are present at birth (congenital nevi), while others come up later in life (acquired nevi).  The sun can stimulate the body to make more moles.  Sunburns are not the only thing that triggers more moles.  Chronic sun exposure can do it too.     Malignant melanoma is a type of skin cancer that can be deadly if it spreads throughout the body. The incidence of melanoma in the [place] is growing rapidly.  Melanoma usually grows near the surface of the skin for a period of time, and then begins to grow deeper into the skin. Once it grows deeper into the skin, the risk of spread to other organs greatly increases. Therefore, early detection and removal of a malignant melanoma can result in a complete cure; removal after the tumor has spread may not be effective.    Melanoma can often be suspected by its appearance. The ABCDE's of melanoma are:    Asymmetry: Asymmetry means if you draw a line through the mole, the two halves do not match in color, size, shape, or surface texture. Asymmetry can be a result of rapid enlargement of a mole, the development of a raised area on a previously flat lesion, scaling, ulceration, bleeding or scabbing within the mole.    Border: The border of a melanoma often blends into the normal skin and does not sharply delineate the mole from normal skin.    Color: Different colors within a mole, or the development of dark black, blue, or red areas in a preexisting mole.    Diameter: Size greater than 0.6cm (1/4 of an inch, or the size of a pencil eraser). This is only a guideline, and many normal moles may be this large or even a bit larger.    Evolving: Any change -- in size, shape, color, elevation, or another trait, or any new symptom such  as bleeding, itching or crusting    Melanomas should be considered when a mole suddenly appears or changes. Itching, burning, or pain in a pigmented lesion should cause suspicion, but most patients with early melanoma have no skin discomfort whatsoever.  The appearance of a mole remains the most reliable method for identifying a malignant melanoma. Suspicious-looking moles may be removed for microscopic examination.    Melanoma can occur anywhere on the skin, including areas that are difficult for self-examination. Many melanomas are first noticed by other family members.      Occasionally, melanomas appear as rapidly growing, blue-black, dome-shaped bumps within a previous mole or previous area of normal skin    Dysplastic moles are moles that fit the ABCDE rules of melanoma, but are not melanomas when examined under the microscope.  They may indicate an increased risk of melanoma in that person. If there is a family history of melanoma, most experts agree that the person is at an increased risk for developing a melanoma.  Experts still do not agree on what dysplastic moles mean in patients without a personal or family history of melanoma.  Dysplastic moles are usually larger than common moles and may have different colors within them with irregular borders. The appearance can be very similar to a melanoma. Biopsies of dysplastic   moles may show abnormalities which are different from a regular mole.      You should take steps to decrease you risk of melanoma.   First, avoid the sun and protect your skin when it is exposed to the sun.  People who have lived near the equator, people who have intermittent exposures to large amounts of sun, and people who have had sunburns in childhood or adolescence have an increased risk for melanoma. Sun sense and sun protection are key to preventing melanoma.    Secondly, moles should be looked at regularly.  Melanomas can be diagnosed early by self-examinations at home.  By looking  for the ABCDE's of moles once a month, you should notice if any changes have occurred and if so, you should make an appointment earlier than your next check-up in order to be examined.  On the other hand, don’t check your moles more than once a month or you might not notice a change.    [facility]    SUN PROTECTION      Why protect against the sun?  In the past, sun exposure was thought to be a healthy benefit of outdoor activity. However, studies have shown many unhealthy effects of sun exposure, such as early aging of the skin and skin cancer.  What kind of damage does sun exposure cause?  Part of the sun's energy that reaches earth is composed of rays of invisible ultraviolet (UV) light. When ultraviolet light rays (UVA and UVB) enter the skin, they damage skin cells, causing visible and invisible injuries.  Sunburn is a visible type of damage, which appears just a few hours after sun exposure. In many people, this type of damage also causes tanning. Freckles, which occur in people with fair skin, are usually due to sun exposure. Freckles are nearly always a sign that sun damage has occurred, and therefore show the need for sun protection.  Ultraviolet light rays also cause invisible damage to skin cells. Some of the injury is repaired, but some of the cell damage adds up year after year. After 20 to 30 years or more, the built-up damage appears as wrinkles, age spots, and even skin cancer. Although window glass blocks UVB light, UVA rays are able to penetrate through glass.  How can I protect myself from excessive sun exposure?  • Avoidance. Stay away from the sun in the middle of the day.  Sun exposure is more intense closer to the equator, in the mountains, and in the summer. The sun's damaging effects are increased by reflection from water, white sand, and snow. Avoid long periods of direct sun exposure. Sit or play in the shade, especially when your shadow is shorter than you are tall.  • Use sun  protective clothing.  Cover up with light colored clothing when outdoors, including a hat to protect the scalp and face. In addition to filtering out the sun, tightly woven clothing reflects heat and helps keep you feeling cool. Sunglasses that block ultraviolet rays protect the eyes and eyelids. Multiple retailers now sell sun protective clothing for adults and children.  Rash guards should be worn during outdoor swimming activity.  • Block sun damage by applying a broad-spectrum UVA and UVB sunscreen with an SPF of 30 or higher and reapply approximately every two hours, even on cloudy days. If swimming or participating in intense physical activity, sunscreen may need to be applied more often.  How do I select the right sunscreen?    Choose a   sunscreen with a SPF 30 or higher. The protective ability of sunscreen is rated by Sun Protection Factor (SPF) -- the higher the SPF, the stronger the protection. Spread it evenly over all uncovered skin, including ears and lips, but avoid the eyelids. Apply sunscreen about 30 minutes before sun exposure. Re-apply after swimming or excessive sweating.    Most importantly, choose a sunscreen that you will wear.  New sunscreens are added to the marketplace frequently, and selection of a particular brand is often a matter of personal preference.       What about the controversies regarding sunscreens?    Hats, clothing, and shade are the most reliable forms of sun protection.  Few people use enough sunscreen to benefit from the SPF protection listed on the label. Most practitioners in our group continue to recommend and utilize many sunscreen products, including chemical and physical sunscreens.  However, studies show that people typically use about a quarter of the recommended amount.     There has been much news coverage recently about the possible dangers of sunscreens. The Environmental Working Group publishes an annual sunscreen report.  The 2010 report, which received a  considerable amount of media attention, cited concerns about a form of vitamin A called retinyl palmitate, found in many sunscreens. Safety testing is far from conclusive, and the FDA continues to investigate whether this compound may accelerate skin damage and elevate skin cancer risk when applied to skin exposed to sunlight.      Many have also raised concerns about chemical sunscreens, which contain substances such as oxybenzone, avobenzone, benzophenone, and parsol 1789. Some believe that these may be converted into hormones when absorbed through the skin. Some prefer physical agents such as zinc oxide or titanium dioxide, however efforts to make these agents more sheer and cosmetically acceptable have resulted in formulations that may contain tiny particles known as “nanoparticles.”  Concerns also exist about their absorption.  Most of the above concerns are theoretical; however, if you are highly concerned about these issues, you can visit the Environmental Working Group’s website devoted to sunscreen safety: www.ewg.org/2010 sunscreen.   Most dermatologists remain convinced that the risks of unprotected sun exposure far outweigh the above mentioned theoretical risks of sunscreens. The type of sunprotection used remains an individual choice for each parent.    What about vitamin D?    Vitamin D is essential for many processes in the body, and is important for bone growth in children.  Over the last few years, many studies have suggested an association between low vitamin D levels and increased risk of certain types of cancers, neurologic disease, autoimmune disease and cardiovascular disease.  While dermatologists agree that the sun is certainly a source of vitamin D, we are also uniquely aware it is also a source of harmful ultraviolet radiation resulting in thousands of skin cancers each year.  The official recommendation of the American Academy of Dermatology (AAD) is that vitamin D should be obtained  through dietary sources and supplementation rather than from sunlight (ultraviolet radiation).    SUNSCREENS:    For acne prone skin:   Use a facial moisturizer with an SPF that says NON-COMEDOGENIC and OIL-FREE on it.    Examples:  Neutrogena facial moisturizer with spf 45 or higher.   ELTA MD UV Clear Spf 46 facial moisturizer with sunscreen (can purchase online or at front desk of [place]/[facility] office)    For sensitive skin (best if the product has only titanium dioxide or   zinc oxide in it):   Solbar spf 30 gel  Vanicream sunscreen for sensitive skin spf 60    Sunscreens with zinc oxide or titanium dioxide provide broader spectrum coverage.     Sunscreen needs to be reapplied every 1.5 to 2 hours when you are outside.     "Sun precautions" online website has clothing w/ protection factor. "Coolibar" is another company with protective clothing. Rit sungaurd on amaz[employer] may add protection factor to clothes.     Remember to avoid sun during the peak hours of 10 am - 2 pm.  Wear a wide brimmed hat when outside.  Wear sunglasses that will cover your entire eye and upper cheeks if possible.

## 2015-03-22 ENCOUNTER — Telehealth (INDEPENDENT_AMBULATORY_CARE_PROVIDER_SITE_OTHER): Payer: Self-pay | Admitting: Dermatology

## 2015-03-22 NOTE — Telephone Encounter (Signed)
Received PA from [facility] for tretinoin   Entered in Covermym[employer] Key: [id number]  Awaiting for approval/denial

## 2015-04-24 ENCOUNTER — Encounter (INDEPENDENT_AMBULATORY_CARE_PROVIDER_SITE_OTHER): Payer: Self-pay | Admitting: Internal Medicine

## 2015-04-25 NOTE — Telephone Encounter (Signed)
From: [name]  To: [name], MD  Sent: 04/24/2015 1:37 PM PST  Subject: 2-Procedural Question    Hello [name],  I'm writing to request a referral to physical therapy for my right shoulder - it has been bothering me for several months and is getting progressively worse. I would also like to make an appointment with you to evaluate if part of the cause is arthritis (runs in my family). Thank you.

## 2015-04-26 NOTE — Telephone Encounter (Signed)
FYI to Dr Sammuel Hines. Appt on Tuesday 05/02/2015 at 8:20 AM.

## 2015-05-02 ENCOUNTER — Ambulatory Visit (INDEPENDENT_AMBULATORY_CARE_PROVIDER_SITE_OTHER): Payer: BC Managed Care – PPO | Admitting: Internal Medicine

## 2015-05-02 ENCOUNTER — Encounter (INDEPENDENT_AMBULATORY_CARE_PROVIDER_SITE_OTHER): Payer: Self-pay | Admitting: Internal Medicine

## 2015-05-02 VITALS — BP 113/63 | HR 87 | Temp 98.5°F | Resp 12 | Wt 180.2 lb

## 2015-05-02 DIAGNOSIS — M7541 Impingement syndrome of right shoulder: Principal | ICD-10-CM

## 2015-05-02 DIAGNOSIS — J069 Acute upper respiratory infection, unspecified: Secondary | ICD-10-CM

## 2015-05-02 DIAGNOSIS — M654 Radial styloid tenosynovitis [de Quervain]: Secondary | ICD-10-CM

## 2015-05-02 MED ORDER — GUAIFENESIN-CODEINE 100-10 MG/5ML OR SOLN
5.0000 mL | Freq: Four times a day (QID) | ORAL | 0 refills | Status: DC | PRN
Start: 2015-05-02 — End: 2015-09-08

## 2015-05-02 NOTE — Progress Notes (Signed)
The patient returns for an acute visit complaining of a several month history of right shoulder pain.  She states that the pain increases when she is reaching overhead or doing other types of work. She notes it anteriorly and is relieved with yoga. There is no radiation of the pain, and no numbness, tingling, or weakness. She also notes some occasional tightness and pain at the left thumb. In addition, she has a URI for the last several days with cough that occasionally keeps her up at night.    Past Medical History   Diagnosis Date    Hypothyroidism      h/o hyperthyroidism s/p radioactive iodine    Infectious mononucleosis 08/2006    Insomnia     Menorrhagia        Current Outpatient Prescriptions   Medication Sig    cetirizine (ZYRTEC) 10 MG tablet Take 10 mg by mouth daily.    cyclobenzaprine (FLEXERIL) 5 MG tablet Take 1 tablet by mouth 3 times daily as needed for Muscle Spasms.    fluticasone propionate (FLONASE) 50 MCG/ACT nasal spray Spray 1 spray into each nostril 2 times daily.    guaiFENesin-codeine (ROBITUSSIN AC) 100-10 MG/5ML oral solution Take 5 mLs by mouth every 6 hours as needed for Cough.    ibuprofen (MOTRIN) 800 MG tablet TAKE 1 TABLET BY MOUTH EVERY 8 HOURS AS NEEDED FOR MILD PAIN (PAIN SCORE 1-3) OR MODERATE PAIN (SCORE 4-6)    levonorgestrel-ethinyl estradiol (AVIANE) 0.1-20 MG-MCG tablet Take 1 tablet by mouth daily.    SYNTHROID 112 MCG tablet TAKE 1 TABLET (112 MCG) BY MOUTH EVERY OTHER DAY.    SYNTHROID 125 MCG tablet TAKE 1 TABLET (125 MCG) BY MOUTH EVERY OTHER DAY.    tretinoin (RETIN-A) 0.025 % cream Apply a thin layer at bedtime as directed    zolpidem (AMBIEN) 10 MG tablet Take 1 tablet by mouth nightly as needed for Insomnia.     No current facility-administered medications for this visit.        Allergies   Allergen Reactions    Vicodin [Hydrocodone-Acetaminophen] Hallucinations       ROS--    She denies a history of hearing loss, ear pain, tinnitus or aural  discharge.    She has no nasal stuffiness,  coryza or bleeding. No sinus pain.    Patient denies sore throat, dental pain, hoarseness, dysphagia, oral or tongue lesions.    The patient denies chest pain, dyspnea, wheezing or hemoptysis.    Patient denies any exertional chest pain, dyspnea, palpitations, syncope, orthopnea, edema or paroxysmal nocturnal dyspnea.    The patient denies abdominal or flank pain, anorexia, nausea or vomiting, dysphagia, change in bowel habits or black or bloody stools or weight loss.    PE--    She appears well, in no apparent distress.  Alert and oriented times three, pleasant and cooperative. Vital signs are as noted by the nurse.    Vitals:    05/02/15 0748   BP: 113/63   Pulse: 87   Resp: 12   Temp: 98.5 F (36.9 C)   TempSrc: Oral   SpO2: 99%   Weight: 81.7 kg (180 lb 3.2 oz)     Nasal exam; septum midline, no deformities, nares patent, normal mucosa without swelling, no polyps, no bleeding.    Throat exam normal. Oral cavity, tongue, pharynx and palate have no inflammation or suspicious lesions. Tonsils - tonsils are present and normal. Teeth normal without tenderness.    No lymphadenopathy  in the anterior or posterior neck.    Chest is clear, no wheezing or rales. Normal symmetric air entry throughout both lung fields. No chest wall deformities or tenderness.    S1 and S2 normal, no murmurs, clicks, gallops or rubs. Regular rate and rhythm. Chest is clear; no wheezes or rales. No edema or JVD.    Shoulder exam - right with full range of motion, + pain on motion, no tenderness or deformity noted.  +Hawkins and Neer sign noted.    Hand exam - left with full range of motion of all joints, no swelling or deformities. There is normal motor, sensory, vascular and tendon function.  There is tenderness over the styloid process of the thumb.      A/P--      ICD-10-CM ICD-9-CM    1. Impingement syndrome, shoulder, right--the patient was offered steroid injection, physical therapy, or both.  She opted for physical therapy to start which has been ordered. M75.41 726.2 CONSULT/REFERRAL TO PHYSICAL THERAPY-OUTSIDE (NON-[place])   2. Radial styloid tenosynovitis--she will have physical therapy per her request. M65.4 727.04 CONSULT/REFERRAL TO PHYSICAL THERAPY-OUTSIDE (NON-[place])   3. Upper respiratory tract infection, unspecified type--will treat symptomatically with guaifenesin with codeine for the cough. J06.9 465.9 guaiFENesin-codeine (ROBITUSSIN AC) 100-10 MG/5ML oral solution

## 2015-05-02 NOTE — Interdisciplinary (Signed)
AVS instructions given to patient  post clinic visit with good understanding.

## 2015-05-03 ENCOUNTER — Ambulatory Visit (INDEPENDENT_AMBULATORY_CARE_PROVIDER_SITE_OTHER): Payer: BC Managed Care – PPO | Admitting: Nurse Practitioner

## 2015-05-03 DIAGNOSIS — H6123 Impacted cerumen, bilateral: Principal | ICD-10-CM

## 2015-05-03 NOTE — Progress Notes (Signed)
SUBJECTIVE:   Brittany [name] is a 49 year old female. She is here today for ear cleaning. Patient denies  pain and drainage. In the past the patient has required ear cleaning. Patient's surgical history on their ears is negative. Family history for ear disorders: negative      OBJECTIVE:  Physical exam revealed bilateral cerumen impaction which was cleaned under otomicroscope. Left ear intact; right ear intact.  Left ear slightly worse than right    ASSESSMENT:  Encounter Diagnoses   Name Primary?    Excessive cerumen in both ear canals Yes       PLAN:   Follow up: PRN / 2 years for cleaning     Kai Levins, NP

## 2015-06-10 ENCOUNTER — Other Ambulatory Visit (INDEPENDENT_AMBULATORY_CARE_PROVIDER_SITE_OTHER): Payer: Self-pay | Admitting: Internal Medicine

## 2015-06-10 DIAGNOSIS — M549 Dorsalgia, unspecified: Principal | ICD-10-CM

## 2015-06-10 DIAGNOSIS — E785 Hyperlipidemia, unspecified: Principal | ICD-10-CM

## 2015-06-10 DIAGNOSIS — E039 Hypothyroidism, unspecified: Secondary | ICD-10-CM

## 2015-06-12 MED ORDER — IBUPROFEN 800 MG OR TABS
800.0000 mg | ORAL_TABLET | Freq: Three times a day (TID) | ORAL | 0 refills | Status: DC | PRN
Start: 2015-06-12 — End: 2017-10-14

## 2015-06-12 NOTE — Telephone Encounter (Signed)
Patient is requesting refill of ibuprofen (MOTRIN) 800 MG tablet .    Last visit with this physician:    05/02/2015  Next appointment with this physician:   Visit date not found    Routing to MD for review/approval

## 2015-06-12 NOTE — Telephone Encounter (Signed)
Forwarding to correct point person

## 2015-06-12 NOTE — Telephone Encounter (Signed)
From: [name]  To: Gita Kudo, MD  Sent: 06/10/2015 12:40 PM PST  Subject: Medication Renewal Request    Original authorizing provider: Gita Kudo, MD    [name] would like a refill of the following medications:  ibuprofen (MOTRIN) 800 MG tablet [place] Nick Axel Filler, MD]    Preferred pharmacy: Other - [place] [phone]: [place] [phone]    Comment:      Medication renewals requested in this message routed to other providers:  cyclobenzaprine (FLEXERIL) 5 MG tablet [name], MD]  SYNTHROID 125 MCG tablet [name], MD]  SYNTHROID 112 MCG tablet [name], MD]

## 2015-06-13 MED ORDER — CYCLOBENZAPRINE HCL 5 MG OR TABS
5.0000 mg | ORAL_TABLET | Freq: Three times a day (TID) | ORAL | 1 refills | Status: DC | PRN
Start: 2015-06-13 — End: 2015-10-17

## 2015-06-13 MED ORDER — LEVOTHYROXINE SODIUM 125 MCG OR TABS
125.0000 ug | ORAL_TABLET | ORAL | 2 refills | Status: DC
Start: 2015-06-13 — End: 2016-02-23

## 2015-06-13 MED ORDER — LEVOTHYROXINE SODIUM 112 MCG OR TABS
112.0000 ug | ORAL_TABLET | ORAL | 2 refills | Status: DC
Start: 2015-06-13 — End: 2016-02-23

## 2015-06-13 NOTE — Telephone Encounter (Signed)
From: Brittany [name]  To: [name], MD  Sent: 06/10/2015 12:40 PM PST  Subject: Medication Renewal Request    Original authorizing provider: [name], MD    Brittany [name] would like a refill of the following medications:  cyclobenzaprine (FLEXERIL) 5 MG tablet [name], MD]  SYNTHROID 125 MCG tablet [name], MD]  SYNTHROID 112 MCG tablet [name], MD]    Preferred pharmacy: Other - [place] [phone]: [place] [phone]    Comment:      Medication renewals requested in this message routed to other providers:  ibuprofen (MOTRIN) 800 MG tablet [place] Nick Axel Filler, MD]

## 2015-06-13 NOTE — Telephone Encounter (Addendum)
Patient is requesting refill [id number] 125 MCG tablet and SYNTHROID 112 MCG tablet and cyclobenzaprine (FLEXERIL) 5 MG tablet   .    Last visit with this physician:    05/02/2015  Next appointment with this physician:   Visit date not found    Lab Results   Component Value Date    TSH 1.08 11/25/2014    TSH <0.03 03/03/2008    FREET4 1.82 11/25/2014    T3 0.9 11/13/2011     Lab Results   Component Value Date    CHOL 224 11/25/2014    HDL 53 11/25/2014    LDLCALC 148 11/25/2014    TRIG 115 11/25/2014       Routing to MD for review/approval

## 2015-07-27 ENCOUNTER — Other Ambulatory Visit (INDEPENDENT_AMBULATORY_CARE_PROVIDER_SITE_OTHER): Payer: Self-pay | Admitting: Obstetrics & Gynecology

## 2015-07-27 DIAGNOSIS — N920 Excessive and frequent menstruation with regular cycle: Principal | ICD-10-CM

## 2015-07-27 NOTE — Telephone Encounter (Signed)
From: [name]  To: Lenna Sciara., MD  Sent: 07/27/2015 11:56 AM PST  Subject: Medication Renewal Request    Original authorizing provider: Lenna Sciara, MD    [name] would like a refill of the following medications:  levonorgestrel-ethinyl estradiol Vassie Moselle) 0.1-20 MG-MCG tablet Lenna Sciara, MD]    Preferred pharmacy: [facility] - , [place] - [phone]    Comment:

## 2015-07-28 MED ORDER — LEVONORGESTREL-ETHINYL ESTRAD 0.1-20 MG-MCG OR TABS
1.0000 | ORAL_TABLET | Freq: Every day | ORAL | 0 refills | Status: DC
Start: 2015-07-28 — End: 2015-10-20

## 2015-07-28 NOTE — Telephone Encounter (Signed)
Pt needs appt for wwe.  Has not been seen since 2014.   Refilled for 3 months only.  Needs to make appt for wwe prior to any additional refills.  Please notify pt.

## 2015-08-08 ENCOUNTER — Encounter: Payer: Self-pay | Admitting: Obstetrics & Gynecology

## 2015-08-08 DIAGNOSIS — Z1231 Encounter for screening mammogram for malignant neoplasm of breast: Principal | ICD-10-CM

## 2015-09-01 ENCOUNTER — Ambulatory Visit
Admission: RE | Admit: 2015-09-01 | Discharge: 2015-09-01 | Disposition: A | Discharge status: Home / Self Care | Payer: BLUE CROSS/BLUE SHIELD | Attending: Diagnostic Radiology | Admitting: Diagnostic Radiology

## 2015-09-01 DIAGNOSIS — Z1231 Encounter for screening mammogram for malignant neoplasm of breast: Secondary | ICD-10-CM | POA: Insufficient documentation

## 2015-09-06 ENCOUNTER — Other Ambulatory Visit: Payer: BLUE CROSS/BLUE SHIELD | Attending: Internal Medicine

## 2015-09-06 DIAGNOSIS — E0500 Thyrotoxicosis with diffuse goiter without thyrotoxic crisis or storm: Principal | ICD-10-CM | POA: Insufficient documentation

## 2015-09-06 LAB — TSH, BLOOD: TSH: 1.57 u[IU]/mL (ref 0.27–4.20)

## 2015-09-06 LAB — LIPID(CHOL FRACT) PANEL, BLOOD
Cholesterol: 245 mg/dL — ABNORMAL HIGH (ref <200)
HDL-Cholesterol: 54 mg/dL
LDL-Chol (Calc): 162 mg/dL — ABNORMAL HIGH (ref <160)
Non-HDL Cholesterol: 191 mg/dL
Triglycerides: 143 mg/dL (ref 10–170)

## 2015-09-07 ENCOUNTER — Encounter (INDEPENDENT_AMBULATORY_CARE_PROVIDER_SITE_OTHER): Payer: Self-pay | Admitting: Internal Medicine

## 2015-09-07 NOTE — Telephone Encounter (Signed)
Forwarded to Dr  Farber.

## 2015-09-07 NOTE — Telephone Encounter (Signed)
From: [name]  To: [name], MD  Sent: 09/07/2015 9:41 AM PDT  Subject: 1-Non Urgent Medical Advice    Hello [name],    I will see you in person Friday morning, but just wanted to note that I didn't fast for the lipid blood test. I went to the lab yesterday afternoon for the thyroid bloodwork and was told I had to do the lipid at the same time, even though I hadn't fasted.    [name]

## 2015-09-08 ENCOUNTER — Encounter (INDEPENDENT_AMBULATORY_CARE_PROVIDER_SITE_OTHER): Payer: Self-pay | Admitting: Internal Medicine

## 2015-09-08 ENCOUNTER — Ambulatory Visit (INDEPENDENT_AMBULATORY_CARE_PROVIDER_SITE_OTHER): Payer: BLUE CROSS/BLUE SHIELD | Admitting: Internal Medicine

## 2015-09-08 VITALS — BP 102/60 | HR 76 | Temp 98.1°F | Resp 16 | Wt 177.0 lb

## 2015-09-08 DIAGNOSIS — E785 Hyperlipidemia, unspecified: Secondary | ICD-10-CM

## 2015-09-08 DIAGNOSIS — K219 Gastro-esophageal reflux disease without esophagitis: Principal | ICD-10-CM

## 2015-09-08 DIAGNOSIS — M7541 Impingement syndrome of right shoulder: Secondary | ICD-10-CM

## 2015-09-08 DIAGNOSIS — R1084 Generalized abdominal pain: Secondary | ICD-10-CM

## 2015-09-08 DIAGNOSIS — E039 Hypothyroidism, unspecified: Secondary | ICD-10-CM

## 2015-09-08 NOTE — Progress Notes (Signed)
The patient returns for followup of her hypothyroidism, insomnia, GERD, right shoulder impingement syndrome, left DeQuervain's tenosynovitis,and allergic rhinitis. She states she has been doing well with no symptoms of hyper or hypothyroidism and no reflux or insomnia.  She states that the shoulder and some have completely resolved with physical therapy. However, she is having several months of chest pains with some diffuse abdominal pain and decreased appetite for the last 2 days. She does admit to to having a great deal of stress since her mother was recently diagnosed in November with stage IV breast cancer. She has no symptoms of hyper or hypo-thyroidism and otherwise is doing well.    Past Medical History:   Diagnosis Date   . Hypothyroidism     h/o hyperthyroidism s/p radioactive iodine   . Infectious mononucleosis 08/2006   . Insomnia    . Menorrhagia        Current Outpatient Prescriptions   Medication Sig   . cetirizine (ZYRTEC) 10 MG tablet Take 10 mg by mouth daily.   . cyclobenzaprine (FLEXERIL) 5 MG tablet Take 1 tablet (5 mg) by mouth 3 times daily as needed for Muscle Spasms.   . fluticasone propionate (FLONASE) 50 MCG/ACT nasal spray Spray 1 spray into each nostril 2 times daily.   Marland Kitchen ibuprofen (MOTRIN) 800 MG tablet Take 1 tablet (800 mg) by mouth every 8 hours as needed for Mild Pain (Pain Score 1-3) or Moderate Pain (Pain Score 4-6).   Marland Kitchen levonorgestrel-ethinyl estradiol (AVIANE) 0.1-20 MG-MCG tablet Take 1 tablet by mouth daily.   Marland Kitchen levothyroxine (SYNTHROID) 112 MCG tablet Take 1 tablet (112 mcg) by mouth every other day.   . levothyroxine (SYNTHROID) 125 MCG tablet Take 1 tablet (125 mcg) by mouth every other day.   . tretinoin (RETIN-A) 0.025 % cream Apply a thin layer at bedtime as directed   . zolpidem (AMBIEN) 10 MG tablet Take 1 tablet by mouth nightly as needed for Insomnia.     No current facility-administered medications for this visit.        Allergies   Allergen Reactions   . Vicodin  [Hydrocodone-Acetaminophen] Hallucinations       ROS--    No symptoms of hypo or hyperthyroidism: no decreased or increased weight, no feeling cold/chilly or excessively warm, no diarrhea or constipation, no undue sweatiness, anxiety or palpitations.    The patient denies cough, chest pain, dyspnea, wheezing or hemoptysis.    Patient denies any exertional chest pain, dyspnea, palpitations, syncope, orthopnea, edema or paroxysmal nocturnal dyspnea.    The patient denies abdominal or flank pain, anorexia, nausea or vomiting, dysphagia, change in bowel habits or black or bloody stools or weight loss.    The patient denies swelling, numbness, tingling or weakness in the extremities.    PE--    She appears well, in no apparent distress.  Alert and oriented times three, pleasant and cooperative. Vital signs are as noted by the nurse.    Vitals:    09/08/15 1026   BP: 102/60   Pulse: 76   Resp: 16   Temp: 98.1 F (36.7 C)   TempSrc: Oral   SpO2: 97%   Weight: 80.3 kg (177 lb)       Thyroid not palpable, not enlarged, no nodules detected.    Chest is clear, no wheezing or rales. Normal symmetric air entry throughout both lung fields. No chest wall deformities or tenderness.    S1 and S2 normal, no murmurs, clicks, gallops or rubs.  Regular rate and rhythm. Chest is clear; no wheezes or rales. No edema or JVD.    The abdomen is soft with mild epigastric tenderness, with no guarding, mass, rebound or organomegaly. Bowel sounds are normal. No CVA tenderness or inguinal adenopathy noted.    Extremities: extremities, peripheral pulses and reflexes normal, no edema, redness or tenderness in the calves or thighs, feet normal, good pulses, normal color, temperature and sensation.    Lab results are reviewed and are as below:    Results for orders placed or performed in visit on 09/06/15   LIPID(CHOL FRACT) PANEL, BLOOD   Result Value Ref Range    Cholesterol 245 (H) <200 mg/dL    HDL-Cholesterol 54 mg/dL    LDL-Chol (Calc) 162 (H)  <160 mg/dL    Non-HDL Cholesterol 191 mg/dL    Triglycerides 143 10 - 170 mg/dL   TSH, BLOOD   Result Value Ref Range    TSH 1.57 0.27 - 4.20 uIU/mL       A/P--      ICD-10-CM ICD-9-CM    1. Gastroesophageal reflux disease without esophagitis--Continue Prevacid. K21.9 530.81    2. Generalized abdominal pain--likely CAT scans but cannot rule out peptic ulcer disease. Will check H. Pylori antigen and she has been given instructions on avoiding gas. However, if her symptoms persist will then proceed with EGD. R10.84 789.07 STOOL H PYLORI ANTIGEN      STOOL H PYLORI ANTIGEN   3. Impingement syndrome, shoulder, right--resolved. M75.41 726.2    4. Acquired hypothyroidism--TSH is in a good range. Will continue her current dose of levothyroxine. E03.9 244.9    5. Hyperlipidemia, unspecified hyperlipidemia type--LDL is above goal, but she was not fasting. Will repeat after her next visit. E78.5 272.4

## 2015-09-08 NOTE — Interdisciplinary (Signed)
AVS instructions given to patient  post clinic visit with good understanding.

## 2015-10-05 ENCOUNTER — Encounter (INDEPENDENT_AMBULATORY_CARE_PROVIDER_SITE_OTHER): Payer: BLUE CROSS/BLUE SHIELD | Admitting: Internal Medicine

## 2015-10-17 ENCOUNTER — Encounter (INDEPENDENT_AMBULATORY_CARE_PROVIDER_SITE_OTHER): Payer: Self-pay | Admitting: Internal Medicine

## 2015-10-17 ENCOUNTER — Ambulatory Visit (INDEPENDENT_AMBULATORY_CARE_PROVIDER_SITE_OTHER): Payer: BLUE CROSS/BLUE SHIELD | Admitting: Internal Medicine

## 2015-10-17 VITALS — BP 108/62 | HR 90 | Temp 98.1°F | Resp 14 | Wt 181.0 lb

## 2015-10-17 DIAGNOSIS — K219 Gastro-esophageal reflux disease without esophagitis: Secondary | ICD-10-CM

## 2015-10-17 DIAGNOSIS — G8929 Other chronic pain: Secondary | ICD-10-CM

## 2015-10-17 DIAGNOSIS — E039 Hypothyroidism, unspecified: Principal | ICD-10-CM

## 2015-10-17 DIAGNOSIS — M545 Low back pain, unspecified: Secondary | ICD-10-CM

## 2015-10-17 DIAGNOSIS — E785 Hyperlipidemia, unspecified: Secondary | ICD-10-CM

## 2015-10-17 MED ORDER — CYCLOBENZAPRINE HCL 5 MG OR TABS
5.0000 mg | ORAL_TABLET | Freq: Three times a day (TID) | ORAL | 1 refills | Status: DC | PRN
Start: 2015-10-17 — End: 2017-06-25

## 2015-10-17 NOTE — Patient Instructions (Signed)
Your lab tests should be done fasting.  These should be done 1 week before your next visit.

## 2015-10-17 NOTE — Progress Notes (Signed)
The patient returns for follow-up of her generalized abdominal pain, hypothyroidism, and hyperlipidemia.  She states that she did not do the H pylori antigen test since her abdominal pain has completely resolved.  She feels it was due to stress.  She is now feeling better overall.  She denies any chest pain, shortness of breath, palpitations, or symptoms of hyper or hypothyroidism.  She had 1 episode heartburn but it was at the time of her period and none since.    Past Medical History:   Diagnosis Date   . Hypothyroidism     h/o hyperthyroidism s/p radioactive iodine   . Infectious mononucleosis 08/2006   . Insomnia    . Menorrhagia        Current Outpatient Prescriptions   Medication Sig   . cetirizine (ZYRTEC) 10 MG tablet Take 10 mg by mouth daily.   . cyclobenzaprine (FLEXERIL) 5 MG tablet Take 1 tablet (5 mg) by mouth 3 times daily as needed for Muscle Spasms.   . fluticasone propionate (FLONASE) 50 MCG/ACT nasal spray Spray 1 spray into each nostril 2 times daily.   Marland Kitchen ibuprofen (MOTRIN) 800 MG tablet Take 1 tablet (800 mg) by mouth every 8 hours as needed for Mild Pain (Pain Score 1-3) or Moderate Pain (Pain Score 4-6).   Marland Kitchen levonorgestrel-ethinyl estradiol (AVIANE) 0.1-20 MG-MCG tablet Take 1 tablet by mouth daily.   Marland Kitchen levothyroxine (SYNTHROID) 112 MCG tablet Take 1 tablet (112 mcg) by mouth every other day.   . levothyroxine (SYNTHROID) 125 MCG tablet Take 1 tablet (125 mcg) by mouth every other day.   . tretinoin (RETIN-A) 0.025 % cream Apply a thin layer at bedtime as directed   . zolpidem (AMBIEN) 10 MG tablet Take 1 tablet by mouth nightly as needed for Insomnia.     No current facility-administered medications for this visit.        Allergies   Allergen Reactions   . Vicodin [Hydrocodone-Acetaminophen] Hallucinations       ROS--    No symptoms of hypo or hyperthyroidism: no decreased or increased weight, no feeling cold/chilly or excessively warm, no diarrhea or constipation, no undue sweatiness,  anxiety or palpitations.    The patient denies cough, chest pain, dyspnea, wheezing or hemoptysis.    Patient denies any exertional chest pain, dyspnea, palpitations, syncope, orthopnea, edema or paroxysmal nocturnal dyspnea.    The patient denies abdominal or flank pain, anorexia, nausea or vomiting, dysphagia, change in bowel habits or black or bloody stools or weight loss.    The patient denies swelling, numbness, tingling or weakness in the extremities.    PE--    She appears well, in no apparent distress.  Alert and oriented times three, pleasant and cooperative. Vital signs are as noted by the nurse.    Vitals:    10/17/15 1607   BP: 108/62   Pulse: 90   Resp: 14   Temp: 98.1 F (36.7 C)   TempSrc: Oral   SpO2: 99%   Weight: 82.1 kg (181 lb)       Thyroid not palpable, not enlarged, no nodules detected.    Chest is clear, no wheezing or rales. Normal symmetric air entry throughout both lung fields. No chest wall deformities or tenderness.    S1 and S2 normal, no murmurs, clicks, gallops or rubs. Regular rate and rhythm. Chest is clear; no wheezes or rales. No edema or JVD.    The abdomen is soft without tenderness, guarding, mass, rebound or organomegaly.  Bowel sounds are normal. No CVA tenderness or inguinal adenopathy noted.    Extremities: extremities, peripheral pulses and reflexes normal, no edema, redness or tenderness in the calves or thighs, feet normal, good pulses, normal color, temperature and sensation.    A/P--      ICD-10-CM ICD-9-CM    1. Acquired hypothyroidism--will recheck TSH prior to her next visit. E03.9 244.9 TSH, BLOOD   2. Hyperlipidemia, unspecified hyperlipidemia type--she will have repeat fasting lipids prior to her next visit. E78.5 272.4 LIPID(CHOL FRACT) PANEL, BLOOD   3. Gastroesophageal reflux disease without esophagitis--will follow up. K21.9 530.81    4. Chronic midline low back pain without sciatica--she is engaging in yoga and can use Flexeril as needed. M54.5 724.2  cyclobenzaprine (FLEXERIL) 5 MG tablet    G89.29 338.29

## 2015-10-17 NOTE — Interdisciplinary (Signed)
AVS instructions given to patient  post clinic visit with good understanding.

## 2015-10-18 ENCOUNTER — Telehealth (HOSPITAL_BASED_OUTPATIENT_CLINIC_OR_DEPARTMENT_OTHER): Payer: Self-pay | Admitting: Internal Medicine

## 2015-10-18 NOTE — Telephone Encounter (Signed)
-----   Message from Adele Schilder sent at 10/17/2015  8:22 AM PDT -----  Regarding: Appointment canceled  Contact: [phone]  Appointment canceled for Brittany [name] [phone])  Visit Type: NEW GENERAL GI  Date        Time      Length    Provider                  Department  10/24/2015     1:00 PM  30 mins.  Baldomero Lamy, MD    [facility]    Reason for Cancellation: Personal Or Not Specified

## 2015-10-20 ENCOUNTER — Other Ambulatory Visit (INDEPENDENT_AMBULATORY_CARE_PROVIDER_SITE_OTHER): Payer: Self-pay | Admitting: Obstetrics & Gynecology

## 2015-10-20 DIAGNOSIS — N920 Excessive and frequent menstruation with regular cycle: Principal | ICD-10-CM

## 2015-10-20 NOTE — Telephone Encounter (Signed)
From: Brittany [name]  To: Lenna Sciara, MD  Sent: 10/20/2015 2:26 PM PDT  Subject: Medication Renewal Request    Original authorizing provider: Lenna Sciara, MD    Brittany [name] would like a refill of the following medications:  levonorgestrel-ethinyl estradiol (AVIANE) 0.1-20 MG-MCG tablet Lenna Sciara, MD]    Preferred pharmacy: [facility] - [place], [place] - [phone]    Comment:  Hello Dr. [name], Please approve the renewal of this prescription with refills, unless you think I should stop taking it. Thank you! [name]

## 2015-10-23 MED ORDER — LEVONORGESTREL-ETHINYL ESTRAD 0.1-20 MG-MCG OR TABS
1.0000 | ORAL_TABLET | Freq: Every day | ORAL | 0 refills | Status: DC
Start: 2015-10-23 — End: 2015-12-14

## 2015-10-23 NOTE — Telephone Encounter (Signed)
Called pt informed her that prescription was filled 1 month only she needs to schedule exam.

## 2015-10-23 NOTE — Telephone Encounter (Signed)
Dr. [name],    Pt would like refills on Aviane pt was last seen on 04/29/13 and has no scheduled appts at this time please review and advise if ok to fill thank you.

## 2015-10-23 NOTE — Telephone Encounter (Signed)
Please notify pt that we asked her with her last refill to schedule an appt with [place] since she has not been seen since 2014.  I have sent a one month refill.  Please have her call and schedule an appt.

## 2015-10-24 ENCOUNTER — Ambulatory Visit (HOSPITAL_BASED_OUTPATIENT_CLINIC_OR_DEPARTMENT_OTHER): Payer: BLUE CROSS/BLUE SHIELD | Admitting: Internal Medicine

## 2015-11-29 ENCOUNTER — Encounter (INDEPENDENT_AMBULATORY_CARE_PROVIDER_SITE_OTHER): Payer: Self-pay | Admitting: Internal Medicine

## 2015-11-29 ENCOUNTER — Other Ambulatory Visit (INDEPENDENT_AMBULATORY_CARE_PROVIDER_SITE_OTHER): Payer: BLUE CROSS/BLUE SHIELD

## 2015-11-29 ENCOUNTER — Other Ambulatory Visit: Payer: BLUE CROSS/BLUE SHIELD | Attending: Internal Medicine | Admitting: Internal Medicine

## 2015-11-29 ENCOUNTER — Telehealth (INDEPENDENT_AMBULATORY_CARE_PROVIDER_SITE_OTHER): Payer: Self-pay | Admitting: Internal Medicine

## 2015-11-29 VITALS — BP 90/60 | HR 75 | Temp 98.4°F | Resp 16 | Wt 185.0 lb

## 2015-11-29 DIAGNOSIS — M19032 Primary osteoarthritis, left wrist: Secondary | ICD-10-CM

## 2015-11-29 DIAGNOSIS — M79644 Pain in right finger(s): Secondary | ICD-10-CM | POA: Insufficient documentation

## 2015-11-29 DIAGNOSIS — M25531 Pain in right wrist: Secondary | ICD-10-CM | POA: Insufficient documentation

## 2015-11-29 DIAGNOSIS — M159 Polyosteoarthritis, unspecified: Secondary | ICD-10-CM

## 2015-11-29 DIAGNOSIS — Z0189 Encounter for other specified special examinations: Secondary | ICD-10-CM

## 2015-11-29 DIAGNOSIS — M189 Osteoarthritis of first carpometacarpal joint, unspecified: Secondary | ICD-10-CM

## 2015-11-29 DIAGNOSIS — Z0000 Encounter for general adult medical examination without abnormal findings: Secondary | ICD-10-CM

## 2015-11-29 DIAGNOSIS — M1990 Unspecified osteoarthritis, unspecified site: Principal | ICD-10-CM | POA: Insufficient documentation

## 2015-11-29 DIAGNOSIS — M7989 Other specified soft tissue disorders: Secondary | ICD-10-CM

## 2015-11-29 DIAGNOSIS — M79645 Pain in left finger(s): Secondary | ICD-10-CM | POA: Insufficient documentation

## 2015-11-29 DIAGNOSIS — R937 Abnormal findings on diagnostic imaging of other parts of musculoskeletal system: Secondary | ICD-10-CM

## 2015-11-29 LAB — CBC WITH DIFF, BLOOD
ANC-Automated: 3.4 10*3/uL (ref 1.6–7.0)
Abs Eosinophils: 0.1 10*3/uL (ref 0.1–0.5)
Abs Lymphs: 3.4 10*3/uL — ABNORMAL HIGH (ref 0.8–3.1)
Abs Monos: 0.4 10*3/uL (ref 0.2–0.8)
Eosinophils: 1 %
Hct: 38.3 % (ref 34.0–45.0)
Hgb: 13.2 gm/dL (ref 11.2–15.7)
Lymphocytes: 46 %
MCH: 32.8 pg — ABNORMAL HIGH (ref 26.0–32.0)
MCHC: 34.5 g/dL (ref 32.0–36.0)
MCV: 95 um3 (ref 79.0–95.0)
MPV: 12 fL (ref 9.4–12.4)
Monocytes: 6 %
Plt Count: 210 10*3/uL (ref 140–370)
RBC: 4.03 10*6/uL (ref 3.90–5.20)
RDW: 12.3 % (ref 12.0–14.0)
Segs: 46 %
WBC: 7.4 10*3/uL (ref 4.0–10.0)

## 2015-11-29 LAB — RF (RHEUMATOID FACTOR), BLOOD: RF: <10 [IU]/mL (ref 0–13)

## 2015-11-29 LAB — SED RATE, BLOOD: Sed Rate: 3 mm/hr (ref 0–20)

## 2015-11-29 NOTE — Patient Instructions (Signed)
Please schedule your Orthopedic appointment with Dr. Wynelle Fanny.

## 2015-11-29 NOTE — Telephone Encounter (Signed)
Noted.  Terri Khalea Ventura RN

## 2015-11-29 NOTE — Progress Notes (Signed)
The patient returns for an acute visit complaining of right wrist pain.    She states that she continues to have the pain at the base of both thumbs, but now for the past several weeks her right wrist has been painful, and now for the past 3 days it has been swollen.  She denies any other joint involvement and has no fevers, chills, rash, or any other associated symptoms.    Past Medical History:   Diagnosis Date   . Hypothyroidism     h/o hyperthyroidism s/p radioactive iodine   . Infectious mononucleosis 08/2006   . Insomnia    . Menorrhagia        Current Outpatient Prescriptions   Medication Sig   . cetirizine (ZYRTEC) 10 MG tablet Take 10 mg by mouth daily.   . cyclobenzaprine (FLEXERIL) 5 MG tablet Take 1 tablet (5 mg) by mouth 3 times daily as needed for Muscle Spasms.   . fluticasone propionate (FLONASE) 50 MCG/ACT nasal spray Spray 1 spray into each nostril 2 times daily.   Marland Kitchen ibuprofen (MOTRIN) 800 MG tablet Take 1 tablet (800 mg) by mouth every 8 hours as needed for Mild Pain (Pain Score 1-3) or Moderate Pain (Pain Score 4-6).   Marland Kitchen levonorgestrel-ethinyl estradiol (AVIANE) 0.1-20 MG-MCG tablet Take 1 tablet by mouth daily.   Marland Kitchen levothyroxine (SYNTHROID) 112 MCG tablet Take 1 tablet (112 mcg) by mouth every other day.   . levothyroxine (SYNTHROID) 125 MCG tablet Take 1 tablet (125 mcg) by mouth every other day.   . tretinoin (RETIN-A) 0.025 % cream Apply a thin layer at bedtime as directed   . zolpidem (AMBIEN) 10 MG tablet Take 1 tablet by mouth nightly as needed for Insomnia.     No current facility-administered medications for this visit.        Allergies   Allergen Reactions   . Vicodin [Hydrocodone-Acetaminophen] Hallucinations       ROS--    No symptoms of hypo or hyperthyroidism: no decreased or increased weight, no feeling cold/chilly or excessively warm, no diarrhea or constipation, no undue sweatiness, anxiety or palpitations.    The patient denies cough, chest pain, dyspnea, wheezing or  hemoptysis.    Patient denies any exertional chest pain, dyspnea, palpitations, syncope, orthopnea, edema or paroxysmal nocturnal dyspnea.    The patient denies abdominal or flank pain, anorexia, nausea or vomiting, dysphagia, change in bowel habits or black or bloody stools or weight loss.    The patient denies swelling, numbness, tingling or weakness in the extremities.    PE--    She appears well, in no apparent distress.  Alert and oriented times three, pleasant and cooperative. Vital signs are as noted by the nurse.    Vitals:    11/29/15 1554   BP: 90/60   BP cuff site: Left;Upper;Arm   BP Patient Position: Sitting   BP cuff size: Large   Pulse: 75   Resp: 16   Temp: 98.4 F (36.9 C)   TempSrc: Oral   SpO2: 95%   Weight: 83.9 kg (185 lb)       Thyroid not palpable, not enlarged, no nodules detected.    Chest is clear, no wheezing or rales. Normal symmetric air entry throughout both lung fields. No chest wall deformities or tenderness.    S1 and S2 normal, no murmurs, clicks, gallops or rubs. Regular rate and rhythm. Chest is clear; no wheezes or rales. No edema or JVD.    The abdomen is  soft without tenderness, guarding, mass, rebound or organomegaly. Bowel sounds are normal. No CVA tenderness or inguinal adenopathy noted.    Wrist exam - right with full range of motion, +swelling  and tenderness with no deformities. Normal radial pulse. Negative Phalen/Tinel signs.      A/P--        ICD-10-CM ICD-9-CM    1. Arthritis-- although this may be just osteoarthritis, cannot rule out RA, SLE, or other inflammatory arthritis into these.  Will check x-rays as well as lab studies and she has been referred to Dr. Wynelle Fanny. M19.90 716.90 X-RAY HAND 2 VIEWS - LEFT      X-RAY HAND 2 VIEWS - RIGHT      X-RAY WRIST 2 VIEWS - LEFT      X-RAY WRIST 2 VIEWS - RIGHT      CBC WITH ADIFF, BLOOD      SED RATE, BLOOD      RF (RHEUMATOID FACTOR), BLOOD      ANA (ANTI-NUCLEAR AB), BLOOD      ANTI-DSDNA, BLOOD       CONSULT/REFERRAL TO ORTHOPEDICS      CBC WITH ADIFF, BLOOD      SED RATE, BLOOD      RF (RHEUMATOID FACTOR), BLOOD      ANA (ANTI-NUCLEAR AB), BLOOD      ANTI-DSDNA, BLOOD   2. Wrist pain, acute, right--  As above. M25.531 719.43 X-RAY HAND 2 VIEWS - LEFT      X-RAY HAND 2 VIEWS - RIGHT      X-RAY WRIST 2 VIEWS - LEFT      X-RAY WRIST 2 VIEWS - RIGHT      CBC WITH ADIFF, BLOOD      SED RATE, BLOOD      RF (RHEUMATOID FACTOR), BLOOD      ANA (ANTI-NUCLEAR AB), BLOOD      ANTI-DSDNA, BLOOD      CONSULT/REFERRAL TO ORTHOPEDICS      CBC WITH ADIFF, BLOOD      SED RATE, BLOOD      RF (RHEUMATOID FACTOR), BLOOD      ANA (ANTI-NUCLEAR AB), BLOOD      ANTI-DSDNA, BLOOD   3. Bilateral thumb pain--  As above. M79.645 729.5 X-RAY HAND 2 VIEWS - LEFT    M79.644  X-RAY HAND 2 VIEWS - RIGHT      X-RAY WRIST 2 VIEWS - LEFT      X-RAY WRIST 2 VIEWS - RIGHT      CBC WITH ADIFF, BLOOD      SED RATE, BLOOD      RF (RHEUMATOID FACTOR), BLOOD      ANA (ANTI-NUCLEAR AB), BLOOD      ANTI-DSDNA, BLOOD      CONSULT/REFERRAL TO ORTHOPEDICS      CBC WITH ADIFF, BLOOD      SED RATE, BLOOD      RF (RHEUMATOID FACTOR), BLOOD      ANA (ANTI-NUCLEAR AB), BLOOD      ANTI-DSDNA, BLOOD   4. Routine lab draw Z00.00 V72.60

## 2015-11-29 NOTE — Telephone Encounter (Signed)
Symptom Triage        Name of PCP Provider: [name]   Insurance Coverage Verified: Active  Last office visit: 10/17/2015  Next office visit: APPT SECURED TODAY  11/29/2015    3:40 PM     [name], MD     Please note or advise only if necessary    Who is reporting the symptoms? Patient  What symptom is the patient experiencing? Pts right wrist has been swollen. States unsure if related to her thumb issue that PCP is aware of, but thumb is not swollen. Wrist has been bothering her for quite some time now, but made bed on Friday night and somehow made it worse.     Is this a new or ongoing symptom? ongoing  Estimated time since experiencing symptom(s)? 4 days    Best way to contact patient: [phone] (mobile)   Alternative communication method: [phone] (mobile)     Caller has been advised this message with symptoms will be transmitted to triage nurse.

## 2015-11-29 NOTE — Interdisciplinary (Signed)
AVS instructions given to patient  post clinic visit with good understanding.

## 2015-11-30 ENCOUNTER — Encounter (INDEPENDENT_AMBULATORY_CARE_PROVIDER_SITE_OTHER): Payer: Self-pay | Admitting: Internal Medicine

## 2015-11-30 LAB — ANTI-DSDNA, BLOOD: Anti-DSDNA: 3 IU (ref 0–24)

## 2015-12-01 LAB — ANA (ANTI-NUCLEAR AB), BLOOD: ANA (Anti-Nuclear Ab): POSITIVE

## 2015-12-04 LAB — ANTI-NUCLEAR-AB-TITER, BLOOD: Anti-Nuclear Ab Titer: 1:160 {titer}

## 2015-12-14 ENCOUNTER — Other Ambulatory Visit: Payer: BLUE CROSS/BLUE SHIELD | Attending: Obstetrics & Gynecology | Admitting: Obstetrics & Gynecology

## 2015-12-14 VITALS — BP 109/69 | HR 78 | Temp 98.0°F | Ht 70.0 in | Wt 181.0 lb

## 2015-12-14 DIAGNOSIS — Z0000 Encounter for general adult medical examination without abnormal findings: Secondary | ICD-10-CM

## 2015-12-14 DIAGNOSIS — Z01419 Encounter for gynecological examination (general) (routine) without abnormal findings: Principal | ICD-10-CM | POA: Insufficient documentation

## 2015-12-14 DIAGNOSIS — N920 Excessive and frequent menstruation with regular cycle: Secondary | ICD-10-CM

## 2015-12-14 MED ORDER — LEVONORGESTREL-ETHINYL ESTRAD 0.1-20 MG-MCG OR TABS
1.0000 | ORAL_TABLET | Freq: Every day | ORAL | 0 refills | Status: DC
Start: 2015-12-14 — End: 2015-12-15

## 2015-12-14 NOTE — Progress Notes (Signed)
Interval History  Brittany [name] is a 50 year old female who is here for   Chief Complaint   Patient presents with   . Gyn Exam   .    VITALS: BP 109/69 (BP cuff site: Left;Upper;Arm, BP Patient Position: Sitting, BP cuff size: Regular)  Pulse 78  Temp 98 F (36.7 C) (Oral)  Ht 5\' 10"  (1.778 m)  Wt 82.1 kg (181 lb)  LMP 12/08/2015  BMI 25.97 kg/m2    Allergies   Allergen Reactions   . Vicodin [Hydrocodone-Acetaminophen] Hallucinations       1.  OB/GYN HISTORY:  Obstetric History    G0   P0   T0   P0   A0   TAB0   SAB0   E0   M0   L0      Patient's last menstrual period was 12/08/2015.  Pt s/p endometrial ablation.  Was also on ocp to help decrease heavy menses.  Has been off ocp for a week.  Considering discontinuing ocp to see how menses are now without it.  Pt denies any cramping or intermenstrual bleeding.  Pt is not sexually active.   Last Mammogram was 3 month(s) ago.  Her last pap smear was 3 year(s), and it was normal, she has had no procedures.  She is not sexually active.      Review of Systems  Constitutional: Negative  Eyes: Negative  ENT: Ringing in ears  Cardiac: Irregular beats  Pulmonary: Negative  Gastrointestional: Negative  Musculoskeletal: Pain in joints  Skin: Negative  Neurologic: Frequent headaches  Psychiatric: Negative  Endocrine: Hot flashes  Blood Disease: Negative  Allergy: Sinus problems  OB/Gyn: Negative      I did review available medical, surgical, obstetrical and social history.    PHYSICAL EXAM:  Head: negative  Neck:  thyroid normal, no adenopathy  Breasts: no lymphadenopathy, no skin changes, no masses or discharge  Abdomen: abdomen soft, non-tender, BS normal, no masses or HSM  Pelvic: normal external female genitalia, BUS-normal appearing  Vulva/Vagina: normal  Cervix: normal in appearance, no lesions or masses  Uterus: normal sized, mobile and non-tender  Adnexa: no adnexal masses or tenderness  Rectal Exam: not performed  Abnormal Findings: none    IMPRESSION/  PLAN: Normal gyn exam.    Will refill ocp for cycle control, however pt may wish to see how symptoms are off ocp.    HCM: PAP Smear/hpv    Counseling: Exercise and Monthly SBE         Patient instructed on: colonoscopy    Patient barriers to [place]: none    Pt/Family understanding: verbalizes    Follow-Up: Return in 1 year for annual exam or  sooner if problems should occur.    Authored by: Lenna Sciara, MD

## 2015-12-15 ENCOUNTER — Ambulatory Visit (INDEPENDENT_AMBULATORY_CARE_PROVIDER_SITE_OTHER): Payer: BLUE CROSS/BLUE SHIELD | Admitting: Rheumatology

## 2015-12-15 ENCOUNTER — Encounter (INDEPENDENT_AMBULATORY_CARE_PROVIDER_SITE_OTHER): Payer: Self-pay | Admitting: Rheumatology

## 2015-12-15 ENCOUNTER — Other Ambulatory Visit (INDEPENDENT_AMBULATORY_CARE_PROVIDER_SITE_OTHER): Payer: Self-pay | Admitting: Obstetrics & Gynecology

## 2015-12-15 VITALS — BP 112/80 | HR 85 | Temp 97.4°F | Resp 15 | Ht 70.0 in | Wt 181.0 lb

## 2015-12-15 DIAGNOSIS — N920 Excessive and frequent menstruation with regular cycle: Principal | ICD-10-CM

## 2015-12-15 DIAGNOSIS — M654 Radial styloid tenosynovitis [de Quervain]: Principal | ICD-10-CM

## 2015-12-15 MED ORDER — LEVONORGESTREL-ETHINYL ESTRAD 0.1-20 MG-MCG OR TABS
1.0000 | ORAL_TABLET | Freq: Every day | ORAL | 11 refills | Status: DC
Start: 2015-12-15 — End: 2015-12-18

## 2015-12-15 MED ORDER — DICLOFENAC SODIUM 1 % EX GEL
2.0000 g | Freq: Four times a day (QID) | TRANSDERMAL | 3 refills | Status: DC
Start: 2015-12-15 — End: 2017-11-24

## 2015-12-15 NOTE — Progress Notes (Signed)
Orthopedics clinic rheumatology evaluation  PCP [name]  Chief complaint:wrist  Patient seen and examined with Dr Orpah Greek.  Agree with her findings.  [name] is a 50 year old female who reports about 6 months of pain and swelling in her thumbs/wrist.  She has not gotten benefit with naproxen.  She has also tried acupuncture and massage.    Past Medical History:   Diagnosis Date   . Hypothyroidism     h/o hyperthyroidism s/p radioactive iodine   . Infectious mononucleosis 08/2006   . Insomnia    . Menorrhagia        Past Surgical History:   Procedure Laterality Date   . CHOLECYSTECTOMY, LAP  1995   . left wrist tendon repair  1998   . PB MYOMECTOMY 1-4 MYOMAS 250 GM/< VAGINAL APPR     . PB REMOVE TONSILS/ADENOIDS,<12 Y/O  1975   . wisdom teeth         Patient Active Problem List   Diagnosis   . Infectious mononucleosis   . Health maintenance examination   . Benign neoplasm of skin, site unspecified   . Cold sensitivity   . Vitamin D deficiency   . Grave's disease   . Right foot pain   . Dysmenorrhea   . Acquired hypothyroidism   . Insomnia   . Sebaceous cyst   . Depression   . Low back pain   . Fatigue   . Palpitations   . Skin lesion   . Pre-operative examination   . Hearing loss   . Cerumen impaction   . Abdominal pain   . Right knee pain   . Routine lab draw   . Dyspepsia   . Intestinal gas excretion   . Gluten intolerance   . Tension headache   . Bilateral low back pain without sciatica   . Allergic rhinitis due to pollen   . Gastroesophageal reflux disease without esophagitis   . Preventative health care   . Impingement syndrome, shoulder, right   . Radial styloid tenosynovitis   . Hyperlipidemia, unspecified hyperlipidemia type   . Chronic midline low back pain without sciatica         Current Outpatient Prescriptions:   .  cetirizine (ZYRTEC) 10 MG tablet, Take 10 mg by mouth daily., Disp: , Rfl:   .  cyclobenzaprine (FLEXERIL) 5 MG tablet, Take 1 tablet (5 mg) by mouth 3 times daily as  needed for Muscle Spasms., Disp: 30 tablet, Rfl: 1  .  fluticasone propionate (FLONASE) 50 MCG/ACT nasal spray, Spray 1 spray into each nostril 2 times daily., Disp: 1 bottle, Rfl: 5  .  ibuprofen (MOTRIN) 800 MG tablet, Take 1 tablet (800 mg) by mouth every 8 hours as needed for Mild Pain (Pain Score 1-3) or Moderate Pain (Pain Score 4-6)., Disp: 30 tablet, Rfl: 0  .  levonorgestrel-ethinyl estradiol (AVIANE) 0.1-20 MG-MCG tablet, Take 1 tablet by mouth daily., Disp: 28 tablet, Rfl: 11  .  levothyroxine (SYNTHROID) 112 MCG tablet, Take 1 tablet (112 mcg) by mouth every other day., Disp: 45 tablet, Rfl: 2  .  levothyroxine (SYNTHROID) 125 MCG tablet, Take 1 tablet (125 mcg) by mouth every other day., Disp: 45 tablet, Rfl: 2  .  tretinoin (RETIN-A) 0.025 % cream, Apply a thin layer at bedtime as directed, Disp: 1 Tube, Rfl: 3  .  zolpidem (AMBIEN) 10 MG tablet, Take 1 tablet by mouth nightly as needed for Insomnia., Disp: 30 tablet, Rfl: 2  Allergies   Allergen Reactions   . Vicodin [Hydrocodone-Acetaminophen] Hallucinations       Family History   Problem Relation Age of Onset   . Breast Cancer Mother    . Other Sister      Uterine fibroids/hysterectomy   . Crohn's Disease Sister    . Breast Cancer Sister    . Ovarian Cancer Neg Hx      but sister w/ovarian cysts age 65       Social History     Social History   . Marital status: Married     Spouse name: N/A   . Number of children: N/A   . Years of education: N/A     Occupational History   . 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 Smoking status: Never Smoker   . Smokeless tobacco: Never Used   . Alcohol use Yes      Comment: 1 bottle wine /week   . Drug use: No   . Sexual activity: Not Currently     Other Topics Concern   . Not on file     Social History Narrative       12 point review of systems is negative except as noted in history of present illness. All other systems negative    Physical exam  Vitals:    12/15/15 1510   BP: 112/80   BP cuff site:  Left;Upper;Arm   BP Patient Position: Sitting   BP cuff size: Regular   Pulse: 85   Resp: 15   Temp: 97.4 F (36.3 C)   TempSrc: Oral   Weight: 82.1 kg (181 lb)   Height: 5\' 10"  (1.778 m)       Body mass index is 25.97 kg/(m^2).    Gen.: Well-developed female  in no apparent distress.  Skin shows no rashes.  Head and neck is unremarkable.  Respiration is normal.  There is tenderness over both extensor tendons of the thumbs, with positive Finkelstein sign.  No evidence of osteoarthritis or inflammatory arthritis in the DIPs, PIPs, MCPs, wrists, or elbows.  Good range of motion of the shoulders and cervical spine.  Good range of motion of both hips.  Good range of motion of both knees without effusions or instability.  No problems in the ankles or feet.  Neurologic exam is nonfocal.    Laboratory studies:  Results for BAILEI, VANDERMOLEN (MRN [id number]) as of 12/15/2015 15:28   Ref. Range 11/29/2015 16:26   WBC Latest Ref Range: 4.0 - 10.0 1000/mm3 7.4   RBC Latest Ref Range: 3.90 - 5.20 mill/mm3 4.03   Hgb Latest Ref Range: 11.2 - 15.7 gm/dL 13.2   Hct Latest Ref Range: 34.0 - 45.0 % 38.3   MCV Latest Ref Range: 79.0 - 95.0 um3 95.0   MCH Latest Ref Range: 26.0 - 32.0 pgm 32.8 (H)   MCHC Latest Ref Range: 32.0 - 36.0 g/dL 34.5   RDW Latest Ref Range: 12.0 - 14.0 % 12.3   Plt Count Latest Ref Range: 140 - 370 1000/mm3 210   MPV Latest Ref Range: 9.4 - 12.4 fL 12.0   Segs Latest Units: % 46   Lymphocytes Latest Units: % 46   Monocytes Latest Units: % 6   Eosinophils Latest Units: % 1   ANC-Automated Latest Ref Range: 1.6 - 7.0 1000/mm3 3.4   Abs Lymphs Latest Ref Range: 0.8 - 3.1 1000/mm3 3.4 (H)   Abs Monos Latest Ref  Range: 0.2 - 0.8 1000/mm3 0.4   Abs Eosinophils Latest Ref Range: <0.1 - 0.5 1000/mm3 0.1   Diff Type Unknown Automated   Sed Rate Latest Ref Range: 0 - 20 mm/hr 3   ANA (Anti-Nucelar Ab) Latest Ref Range: Negative  Positive   Anti-Nuclear Ab Titer Latest Ref Range: <1:40 titer 1:160   Anti-DSDNA Latest  Ref Range: 0 - 24 IU 3   RF Latest Ref Range: 0 - 13 [IU]/mL <10     Imaging:  X-ray with mild osteoarthritis of the CMC.        Assessment and plan  #1 Alfonse Ras tenosynovitis/osteoarthritis of the [facility].  While patient does have mild underlying osteoarthritis her current symptoms are compatible with tenosynovitis of the extensor tendon of the thumb.  We discussed options including topicals, bracing, therapy, injection, and surgery.  She will try bracing and topical diclofenac.

## 2015-12-15 NOTE — Telephone Encounter (Signed)
Pt was seen yesterday for WWE and Rx was written for levonorgestrel-ethinyl estradiol (AVIANE) 0.1-20 MG-MCG tablet. Pt states when she went to pick up Rx she was only able to pick One pack and was informed No refills.    Pt calling to request for new Rx to be called in for a year.     Best number to be reached at [phone]. Ok to leave a confidential detailed message. Please advise        [id number] - [place], [place]. [phone]

## 2015-12-15 NOTE — Progress Notes (Signed)
RHEUMATOLOGY CLINIC  12/15/15     HPI: Brittany [name] is a 50 year old female here for B/L thumb and right wrist pain.    Brittany [name] has had B/L thumb pain for 5-28mo and wrist pain and swelling for 85mo.  The pain and swelling have not changed in that time.  She has tried Aleve, massage, and acupuncture which have not helped.  She occasionally uses OTC joint cream at night which offers some relief.  Thumbs and wrists bother her with certain activities and movements worse in the morning and evening.  The wrist is more painful than the thumbs.    PMH:  Past Medical History:   Diagnosis Date   . Hypothyroidism     h/o hyperthyroidism s/p radioactive iodine   . Infectious mononucleosis 08/2006   . Insomnia    . Menorrhagia        PSH:  Past Surgical History:   Procedure Laterality Date   . CHOLECYSTECTOMY, LAP  1995   . left wrist tendon repair  1998   . PB MYOMECTOMY 1-4 MYOMAS 250 GM/< VAGINAL APPR     . PB REMOVE TONSILS/ADENOIDS,<12 Y/O  1975   . wisdom teeth         Medications:  Current Outpatient Prescriptions on File Prior to Visit   Medication Sig Dispense Refill   . cetirizine (ZYRTEC) 10 MG tablet Take 10 mg by mouth daily.     . cyclobenzaprine (FLEXERIL) 5 MG tablet Take 1 tablet (5 mg) by mouth 3 times daily as needed for Muscle Spasms. 30 tablet 1   . fluticasone propionate (FLONASE) 50 MCG/ACT nasal spray Spray 1 spray into each nostril 2 times daily. 1 bottle 5   . ibuprofen (MOTRIN) 800 MG tablet Take 1 tablet (800 mg) by mouth every 8 hours as needed for Mild Pain (Pain Score 1-3) or Moderate Pain (Pain Score 4-6). 30 tablet 0   . [DISCONTINUED] levonorgestrel-ethinyl estradiol (AVIANE) 0.1-20 MG-MCG tablet Take 1 tablet by mouth daily. 28 tablet 0   . levothyroxine (SYNTHROID) 112 MCG tablet Take 1 tablet (112 mcg) by mouth every other day. 45 tablet 2   . levothyroxine (SYNTHROID) 125 MCG tablet Take 1 tablet (125 mcg) by mouth every other day. 45 tablet 2   . tretinoin (RETIN-A) 0.025 % cream  Apply a thin layer at bedtime as directed 1 Tube 3   . zolpidem (AMBIEN) 10 MG tablet Take 1 tablet by mouth nightly as needed for Insomnia. 30 tablet 2     No current facility-administered medications on file prior to visit.        Allergies:  Allergies   Allergen Reactions   . Vicodin [Hydrocodone-Acetaminophen] Hallucinations       Physical Exam:    12/15/15  1510   BP: 112/80   Pulse: 85   Resp: 15   Temp: 97.4 F (36.3 C)       Gen: NAD  Skin: no rashes  HEENT: unremarkable  MSK:   RUE: mild swelling radial side of wrist, tender, +Finkelstein, mild pain at [facility], otherwise no swelling/pain of DIPs/PIPs/MCPs/wrist/elbow, ROM and sensation intact  LUE: mild pain at [facility] otherwise no swelling/pain DIPs/PIPs/MCPs/wrist/elbow, ROM and sensation intact      Imaging:   XR B/L hands:   No definitive acute osseous abnormality, erosive disease, or chondrocalcinosisof the wrists and hands.    Minimal to mild polyarticular osteoarthrosis of the left wrist and hand, particularly involving the radiocarpal compartment, scaphotrapezial and  first carpometacarpal joints.    Polyarticular osteoarthrosis of the right wrist and hand, most pronounced at the first carpometacarpal joint where there are moderate changes.    Mild amount of soft tissue swelling overlying the right radial styloid, nonspecific but may be seen with de Quervain tenosynovitis.    A/P: Brittany [name] is a 50 year old female here for right wrist pain, B/L thumb pain.    #1: DeQuervain's tenosynovitis  - +Finkelstein, tender over snuffbox.  We discussed treatment options including topical diclofenac, wrist bracing for support, occupational therapy, CSI, and surgery.  Brittany [name] elected to try topical diclofenac and wrist bracing for now. If she doesn't have improvement she would like to try OT.    #2: OA B/L CMC  - She has very mild OA.  She can start with activity modification and use topical diclofenac over those areas as well as her pain is not too  significant.      She can follow-up as needed.      Loreta Ave, MD  Orthopaedic Surgery, PGY1

## 2015-12-15 NOTE — Telephone Encounter (Signed)
New rx sent with one year of refills.  Please notify pt

## 2015-12-15 NOTE — Interdisciplinary (Signed)
The patient was fitted for velcro wrist brace for right upper extremity per Dr. Lowella Petties.  The patient was assisted and educated on proper fit and usage of the product.      The  [facility] return policy was reviewed with the patient and they received a copy of the Patient Product Agreement.

## 2015-12-18 ENCOUNTER — Encounter (INDEPENDENT_AMBULATORY_CARE_PROVIDER_SITE_OTHER): Payer: Self-pay | Admitting: Obstetrics & Gynecology

## 2015-12-18 DIAGNOSIS — N920 Excessive and frequent menstruation with regular cycle: Principal | ICD-10-CM

## 2015-12-18 MED ORDER — LEVONORGESTREL-ETHINYL ESTRAD 0.1-20 MG-MCG OR TABS
1.0000 | ORAL_TABLET | Freq: Every day | ORAL | 3 refills | Status: DC
Start: 2015-12-18 — End: 2016-09-03

## 2015-12-18 NOTE — Telephone Encounter (Signed)
Rx sent 

## 2015-12-18 NOTE — Telephone Encounter (Signed)
From: [name]  To: Lenna Sciara, MD  Sent: 12/18/2015 11:05 AM PDT  Subject: 6-General Complaint    Hello [facility],  I am following up on my phone call to your office on Friday 7/28 and my office visit 7/26 with Dr. [name]. She renewed my prescription for the pill (Aviane) for 1 year, but my pharmacy didn't receive the renewal.   Please send the authorization renewal for 1 year to the [facility] on [address]. Currently they only gave me 1 pack with zero refills. Normally I get 3 packs and refills.  Please call my cell if you have any questions.  Thank you,  Brittany [name]

## 2015-12-18 NOTE — Telephone Encounter (Addendum)
I called pt and let her know that refill was approved.

## 2015-12-18 NOTE — Procedures (Signed)
SPECIMENS SUBMITTED:  Cervical (Pap) Smear;Thin-Prep Vial Received    CLINICAL INFORMATION:  LMP: 12/08/2015 Other Clinical Findings: Screening  FINAL CYTOLOGIC INTERPRETATION:  No Atypical or Malignant Cells    COMMENT:  HPV Subtyping will be performed on excess Thin-Prep collection fluid from  the specimen vial.  Result will be reported in EPIC separately.  Endometrial cells present correlate with the menstrual history provided.    SPECIMEN ADEQUACY:  Satisfactory for evaluation  The Pap smear is a screening test with an inherent low error rate. Although  a normal [negative] result is highly predictive of the absence of cervical  cancer and its precursor lesions, it does not exclude the presence of  significant disease. Results must be evaluated within the context of the  individual patient.  CONFIDENTIAL HEALTH INFORMATION: Health Care information is personal and  sensitive information. If it is being faxed to you it is done so under  appropriate authorization from the patient or under circumstances that do  not require patient authorization. You, the recipient, are obligated to  maintain it in a safe, secure and confidential manner. Re-disclosure  without additional patient consent or as permitted by law is prohibited.  Unauthorized re-disclosure or failure to maintain confidentiality could  subject you to penalties described in federal and state law.  If you have  received this report or facsimile in error, please notify the [facility] immediately and destroy the received document(s).    Material reviewed and Interpreted and  Report Electronically Signed by:  Wilson Singer SCT (ASCP)  12/18/15 16:07  Electronic Signature derived from a single  controlled access password

## 2015-12-19 ENCOUNTER — Encounter (INDEPENDENT_AMBULATORY_CARE_PROVIDER_SITE_OTHER): Payer: Self-pay

## 2015-12-21 LAB — HPV HIGH RISK DNA PROBE, FEMALE
HPV High Risk Genotype 16: NOT DETECTED
HPV High Risk Genotype 18: NOT DETECTED
HPV Other High Risk Genotypes (Not type 16 or 18): NOT DETECTED

## 2015-12-27 ENCOUNTER — Encounter (INDEPENDENT_AMBULATORY_CARE_PROVIDER_SITE_OTHER): Payer: Self-pay | Admitting: Rheumatology

## 2015-12-27 DIAGNOSIS — M654 Radial styloid tenosynovitis [de Quervain]: Principal | ICD-10-CM

## 2015-12-27 NOTE — Telephone Encounter (Signed)
From: [name]  To: Geralyn Corwin, MD  Sent: 12/27/2015 9:30 AM PDT  Subject: 2-Procedural Question    Hello Dr. Lowella Petties,    I saw you on July 28 for tendonitis in my right wrist. I would like to start occupational therapy. Please send me the referral information for therapy at [facility].    Thank you.  Brittany [name]

## 2015-12-29 NOTE — Telephone Encounter (Signed)
done

## 2016-01-08 ENCOUNTER — Telehealth (HOSPITAL_BASED_OUTPATIENT_CLINIC_OR_DEPARTMENT_OTHER): Payer: Self-pay

## 2016-01-08 NOTE — Telephone Encounter (Signed)
I have attempted to reach the patient and schedule a therapy appointment. I was able to leave a message and provide our main line [phone], so the patient may call us back to schedule at their earliest convenience.       Thank you,  Rehabilitation Services

## 2016-01-08 NOTE — Telephone Encounter (Signed)
Patient is calling in to schedule for therapy. Order was placed on 8/11.

## 2016-01-11 ENCOUNTER — Encounter (INDEPENDENT_AMBULATORY_CARE_PROVIDER_SITE_OTHER): Payer: Self-pay | Admitting: Obstetrics & Gynecology

## 2016-01-19 ENCOUNTER — Ambulatory Visit (INDEPENDENT_AMBULATORY_CARE_PROVIDER_SITE_OTHER): Payer: BLUE CROSS/BLUE SHIELD | Admitting: Rehabilitative and Restorative Service Providers"

## 2016-01-19 DIAGNOSIS — M654 Radial styloid tenosynovitis [de Quervain]: Secondary | ICD-10-CM

## 2016-01-19 DIAGNOSIS — M25531 Pain in right wrist: Principal | ICD-10-CM

## 2016-01-19 NOTE — Interdisciplinary (Signed)
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 Pt has a ergo mouse  Prior Level of Function: No limitations    Objective  Pain Score (0-10): 4  Pain Location: Fluctuates  Type of Eval: Evaluation- low complexity [id number])  ADLs: Independent  Tests  Tinels Right: -  Finklestein Right: + (pain along 1st dorsal compartment)        Upper Extremity   Splints/Positioning Devices: Pt has a carpal tunnel splint, patient fitted with a custom molded hand based thumb spica splint for CMC arthritis. Educated in wearing schedule , application and precautions   Upper Extremity Comments: TTP along 1st dorsal compartment, positive Finkelstein's, positive grind test for [facility] arthritis with pain elicitied, . ROM full and unrestricted.          Splints/Positioning Devices  Splints/Positioning Devices: Pt has a carpal tunnel splint, patient fitted with a custom molded hand based thumb spica splint for CMC arthritis. Educated in wearing schedule , application and precautions    Hands   Painful R pinch     Right Hand Strength - Grip (lbs)  R Grip (lbs): 45  Right Hand Strength - Pinch (lbs)  Lateral: 14  Tip (2 point): 10  Tip (3 point): 10              Left Hand Strength - Grip (lbs)  L Grip (lbs): 65  Left Hand Strength - Pinch (lbs)  Lateral: 14  Tip (2 point): 10  Tip (3 point): 10       Assessment   Assessment : CMC arthritis with DeQuervain's    Treatment Today   Patient seen for custom splinting, A/AA/PROM ex to  thumb and wrist, paraffin with heat for pain, education, HEP        Plan  The goals listed below are achievable and realistic within the designated time frame and the treatments listed below, and referred to in the treatment plan, are necessary to achieve these goals. The functional goals were created based on the patient's prior level of function.    Treatment Plan  Diagnosis: R CMC arthritis with DeQuervain"s  Diagnosis ICD9: 719.47  Assessment: R wrist CMC arthritis and DeQervain's. Exacerbated by work activities. OT hand therapy to help manage symptoms  Problems: Pain  Patient Stated Goal: To be able to use the right hand without pain  Long Term Goal: Patient will be able to use her R thumb without pain  Projected Completion date: 3 weeks estimated  Short Term Goal #1: Patient will be independent in a HEP to manage R wrist/thumb pain  # Visits: 1-3  Short Term Goal #2: Patient will verbalize 0/10 pain when using her R wrist/hand   # Visits: 7-10  Interventions: Therapeutic exercises  Frequency of Treatment: 1x wk  Duration of Treatment: 3-6 weeks  Patient/Family Teaching: Completed with patient/family/ caregiver  Response to Therapy : Good    Total Treatment Time (min): 45  Total Timed Treatment (min) : 30

## 2016-01-24 ENCOUNTER — Ambulatory Visit (INDEPENDENT_AMBULATORY_CARE_PROVIDER_SITE_OTHER): Payer: BLUE CROSS/BLUE SHIELD | Admitting: Rehabilitative and Restorative Service Providers"

## 2016-01-24 DIAGNOSIS — M25531 Pain in right wrist: Principal | ICD-10-CM

## 2016-01-24 NOTE — Interdisciplinary (Signed)
Occupational Therapy Hand Treatment Note        Preferred Language:English    Subjective  Status Since Last Visit: Pt wearing CMC brace at night  Pain Score (0-10): 3  Pain Location: along R web space    Treatment Today  Treatment today:   Therex [id number]: x2          Objective     Objective Findings: Reviewed HEP focusing on wearing CMC thumb brace with activities and avoiding aggravating activities. Paraffin with HP along L hand for increasing tissue circ and promoting tissue healing. Pt completed eccentric wrist and thumb exercises for increasing tissue healing. STM along web space for promoting tissue healing and increasing tissue circ,     Assessment  Assessment : Pt demo good understanding of HEP and demo good results from eccentric strengthening and STM along web space. Pt may benefit from thumb spica wrist brace as pt demo increased pain at 1st dorsal compartment and CMC.     Plan  Patient to continue therapy for No diagnosis found.  Focus for Next Treatment: Therapeutic exercises;Manual therapy;HEP instruction;Patient exercise program instruction  Frequency of Treatment: 1x wk    Total Treatment Time (min): 30  Total Timed Treatment (min) : 30

## 2016-01-31 ENCOUNTER — Ambulatory Visit (INDEPENDENT_AMBULATORY_CARE_PROVIDER_SITE_OTHER): Payer: BLUE CROSS/BLUE SHIELD | Admitting: Rehabilitative and Restorative Service Providers"

## 2016-01-31 DIAGNOSIS — M25531 Pain in right wrist: Principal | ICD-10-CM

## 2016-01-31 NOTE — Interdisciplinary (Signed)
Occupational Therapy Hand Treatment Note        Preferred Language:English    Subjective  Status Since Last Visit: Pt completing eccentric wrist and thumb exercises  Pain Score (0-10): 4  Pain Location: along 1st dorsal compartment    Treatment Today  Treatment today:   Manual Therapy [97140]: x1  Therex [97110]: x1          Objective     Objective Findings: ParaffinxMHP 5 min for increasing tissue extensibility. Soft tissue mobilization along R FA near 1st dorsal compartment for increasing tissue circulation and promoting healing.  Eccentric thumb and wrist exercises without weight. Kinesiotape along 1st dorsal compartment for tissue lengthening. Pretx 0/50 L thumb MP flexion, post tx 0/65    Assessment  Assessment : Pt demo good results from soft tissue mobilization evident with increased AROM R MP thumb flexion 0/65 post tx.     Plan  Patient to continue therapy for No diagnosis found.  Focus for Next Treatment: Therapeutic exercises;HEP instruction  Frequency of Treatment: 1x wk    Total Treatment Time (min): 30  Total Timed Treatment (min) : 30

## 2016-02-06 ENCOUNTER — Ambulatory Visit (INDEPENDENT_AMBULATORY_CARE_PROVIDER_SITE_OTHER): Payer: BLUE CROSS/BLUE SHIELD | Admitting: Rehabilitative and Restorative Service Providers"

## 2016-02-06 DIAGNOSIS — M25531 Pain in right wrist: Secondary | ICD-10-CM

## 2016-02-06 DIAGNOSIS — M654 Radial styloid tenosynovitis [de Quervain]: Principal | ICD-10-CM

## 2016-02-06 NOTE — Interdisciplinary (Signed)
Occupational Therapy Hand Treatment Note        Preferred Language:English    Subjective  Status Since Last Visit: Pt reporting increased "soreness" from last session and continued "aching" in the wrist  Pain Score (0-10): 4  Pain Location: along 1st dorsal compartment    Treatment Today  Treatment today:   Manual Therapy [97140]: x1  Therex [97110]: x1     Modalities  Ultrasound/Phono [97035]: x1    Objective     Objective Findings: Paraffin with MH"'s x 6 min, STM to F/A , 1st dorsal compartment,  A/AA/PROM ex, friction massage to 1st dorsal comparmtnent, K tape applied, review of eccentric and stretching ex    Assessment  Assessment : Improved comfort    Plan  Patient to continue therapy for     ICD-10-CM ICD-9-CM    1. Radial styloid tenosynovitis M65.4 727.04    2. Right wrist pain M25.531 719.43      Focus for Next Treatment: Therapeutic exercises;Modalities;HEP instruction;Patient exercise program instruction  Frequency of Treatment: 1x wk    Total Treatment Time (min): 30  Total Timed Treatment (min) : 30

## 2016-02-13 ENCOUNTER — Encounter (INDEPENDENT_AMBULATORY_CARE_PROVIDER_SITE_OTHER): Payer: BLUE CROSS/BLUE SHIELD | Admitting: Rehabilitative and Restorative Service Providers"

## 2016-02-16 ENCOUNTER — Ambulatory Visit (INDEPENDENT_AMBULATORY_CARE_PROVIDER_SITE_OTHER): Payer: BLUE CROSS/BLUE SHIELD | Admitting: Rehabilitative and Restorative Service Providers"

## 2016-02-16 DIAGNOSIS — M79631 Pain in right forearm: Principal | ICD-10-CM

## 2016-02-16 NOTE — Interdisciplinary (Signed)
Occupational Therapy Daily Follow up Note            Referring Physician: Geralyn Corwin               Preferred [place]    Assessment              Assessment: Pt demo good understanding of HEP, decreased pain post tx. Pt has palpable pain along 1st dorsal compartment, ECRB and EDC. Issued eccentric exercises for pronated and supinated wrist position.     Plan  Patient to continue therapy for No diagnosis found.          Occupational Therapy Plan and Progress Report       02/16/16 1100    Outpatient Treatment Plan    Amount of Treatment 1 time per day    Frequency of Treatment 2 times per week    Duration of Treatment 12 weeks      02/16/16 1100    Treatment Plan Discussion    Include in My Healthcare Myself    Treatment Plan Discussion & Agreement Patient    Patient/Family Questions Yes - All questions asked & answered      02/16/16 1100    Subjective    Status since last treatment Pt wearing wrist brace when sleeping      02/16/16 1100    Outpatient Pain Assessment    OP Pain Numeric Pain Rating Scale      02/16/16 1100    Numeric Pain Rating Scale     Pain Intensity - rating at present 3    Pain Intensity - rating at worst  5    Pain Intensity- rating after treatment 2    Frequency  Intermittent    Description  Aching;Dull;Throbbing      02/16/16 1100    Objective Findings    Objective Findings Paraffin with MHPx53min educated pt on activity modification, avoiding deviation. STM to R FA/1st dorsal compartment. USx100%x1.5w/cm2x38min while in ulnar deviation prolonged stretch promote tissue extensibility. Reviewed exercises-eccentric strengthening.      02/16/16 1100    Treatment Provided Today    Payor Medicare/Private      02/16/16 1100    Therapeutic Procedures    Manual Therapy ([id number]) Deep tissue mobilization;Trigger point release        Total TIMED Treatment (min) 15    Therapeutic Exercise (97110) Eccentric training;Home Exercise Program (HEP) demonstration and performance        Total TIMED Treatment (min) 15      02/16/16 1100    Treatment Time    Treatment Start Time 1100    Total TIMED Treatment (min) 30    Total Treatment Time (min) 30      02/16/16 1100    Focus for Next Treatment    Focus for Next Treatment Manual therapy;Therapeutic exercise;Safety instruction;Patient exercise program instruction      02/16/16 1100    Assessment    Assessment Pt demo good understanding of HEP, decreased pain post tx 2/10

## 2016-02-21 ENCOUNTER — Ambulatory Visit (INDEPENDENT_AMBULATORY_CARE_PROVIDER_SITE_OTHER): Payer: BLUE CROSS/BLUE SHIELD | Admitting: Rehabilitative and Restorative Service Providers"

## 2016-02-21 DIAGNOSIS — M654 Radial styloid tenosynovitis [de Quervain]: Secondary | ICD-10-CM

## 2016-02-21 DIAGNOSIS — M79631 Pain in right forearm: Principal | ICD-10-CM

## 2016-02-21 NOTE — Interdisciplinary (Signed)
Occupational Therapy Daily Follow up Note            Referring Physician: Geralyn Corwin               Preferred [place]    Assessment              Assessment: Decreasing symptoms  Progress Towards Goals: Patient has made moderate progress towards achievement of goals  Potential to improve with therapy: Excellent    Plan  Patient to continue therapy for     ICD-10-CM ICD-9-CM    1. Right forearm pain M79.631 729.5    2. Radial styloid tenosynovitis M65.4 727.04              Occupational Therapy Plan and Progress Report       02/21/16 1500    Outpatient Treatment Plan    Amount of Treatment 1 time per day    Frequency of Treatment 2 times per week    Duration of Treatment 3 weeks    Status of Treatment Patient evaluated and will benefit from ongoing skilled therapy      02/21/16 1500    Treatment Plan Discussion    Include in My Healthcare Myself    Treatment Plan Discussion & Agreement Patient    Patient/Family Questions Yes - All questions asked & answered      02/21/16 1500    Subjective    Status since last treatment continued aching along the 1st dorsal compartment      02/21/16 1500    Outpatient Pain Assessment    OP Pain Numeric Pain Rating Scale      02/21/16 1500    Numeric Pain Rating Scale     Pain Intensity - rating at present 3    Pain Intensity - rating at worst  5    Pain Intensity- rating after treatment 2    Frequency  Intermittent    Description  Aching;Throbbing      02/21/16 1500    Objective Findings    Objective Findings Pt seen for paraffin with heat, A/aa/PROM ex to wrist and thumb, light stretching, followed by coban wrap to thumb added support      02/21/16 1500    Treatment Provided Today    Payor Medicare/Private      02/21/16 1500    Therapeutic Procedures    Manual Therapy (97140) Soft tissue mobilization;Patient education;Joint mobilization;Joint manipulation;Deep tissue massage        Total TIMED Treatment (min) 15        Manual Therapy 15    Therapeutic Exercise (97110) Eccentric training;Home Exercise Program (HEP) demonstration and performance        Total TIMED Treatment (min) 15      02/21/16 1500    Modalities    Hot/Cold Packs (97010) Moist hot pack application    Paraffin Bath [phone]) Completed      02/21/16 1500    Treatment Time    Treatment Start Time 1600    Total TIMED Treatment (min) 30    Total Treatment Time (min) 30      02/21/16 1500    Focus for Next Treatment    Focus for Next Treatment Manual therapy;Therapeutic exercise;Safety instruction;Patient exercise program instruction      02/21/16 1500    Assessment    Assessment Decreasing symptoms    Progress Towards Goals Patient has made moderate progress towards achievement of goals    Potential to improve with therapy Excellent  Therapy Goals                                                                                        Current Level Goals for Episode of Care    Functional Deficit     Long term Functional Goal      Impairment #1   Short Term Goal #1     Impairment #2   Short Term Goal #2     Impairment #3   Short Term Goal #3     Impairment #4   Short Term Goal #4     Functional Limitation Reporting

## 2016-02-23 ENCOUNTER — Other Ambulatory Visit (INDEPENDENT_AMBULATORY_CARE_PROVIDER_SITE_OTHER): Payer: Self-pay | Admitting: Internal Medicine

## 2016-02-23 DIAGNOSIS — E039 Hypothyroidism, unspecified: Principal | ICD-10-CM

## 2016-02-23 MED ORDER — SYNTHROID 112 MCG OR TABS
ORAL_TABLET | ORAL | 1 refills | Status: DC
Start: 2016-02-23 — End: 2016-08-20

## 2016-02-23 MED ORDER — SYNTHROID 125 MCG OR TABS
ORAL_TABLET | ORAL | 1 refills | Status: DC
Start: 2016-02-23 — End: 2016-08-20

## 2016-02-23 NOTE — Telephone Encounter (Signed)
Patient is requesting refill of medication, please see orders for preloaded prescription refill.    Requested Prescriptions     Pending Prescriptions Disp Refills   . SYNTHROID 125 MCG tablet [Pharmacy Med Name: SYNTHROID 125MCG    TAB ABBV] 45 tablet 1     Sig: TAKE 1 TABLET (125 MCG) BY MOUTH EVERY OTHER DAY.   . SYNTHROID 112 MCG tablet [Pharmacy Med Name: SYNTHROID 112MCG    TAB ABBV] 45 tablet 1     Sig: TAKE 1 TABLET (112 MCG) BY MOUTH EVERY OTHER DAY.       LOV:   11/29/2015  NOV:   Visit date not found  Last RX fill: 06/13/2015    Last Thyroid Labs:  Lab Results   Component Value Date    TSH 1.57 09/06/2015    FREET4 1.82 11/25/2014

## 2016-03-01 ENCOUNTER — Encounter (INDEPENDENT_AMBULATORY_CARE_PROVIDER_SITE_OTHER): Payer: BLUE CROSS/BLUE SHIELD | Admitting: Rehabilitative and Restorative Service Providers"

## 2016-03-04 ENCOUNTER — Encounter (INDEPENDENT_AMBULATORY_CARE_PROVIDER_SITE_OTHER): Payer: Self-pay | Admitting: Internal Medicine

## 2016-03-04 NOTE — Telephone Encounter (Signed)
Records updated. FYI.

## 2016-03-04 NOTE — Telephone Encounter (Signed)
From: [name]  To: [name], MD  Sent: 03/04/2016 8:36 AM PDT  Subject: 20-Other    Hello, I received my 2017 flu shot on October 11 at the [facility] at [facility].  Brittany [name]

## 2016-03-06 ENCOUNTER — Telehealth: Payer: Self-pay

## 2016-03-06 ENCOUNTER — Encounter (INDEPENDENT_AMBULATORY_CARE_PROVIDER_SITE_OTHER): Payer: BLUE CROSS/BLUE SHIELD | Admitting: Rehabilitative and Restorative Service Providers"

## 2016-03-06 NOTE — Telephone Encounter (Signed)
cxl appt for OT Therapy for today w/Lisa  per pt's VM left on 10/18 @ 8:09 a.m. She has a cold. Needed to cxl.

## 2016-03-13 ENCOUNTER — Telehealth (HOSPITAL_BASED_OUTPATIENT_CLINIC_OR_DEPARTMENT_OTHER): Payer: Self-pay

## 2016-03-13 NOTE — Telephone Encounter (Signed)
Patient is requesting an extension for PT referral.

## 2016-03-15 ENCOUNTER — Encounter (INDEPENDENT_AMBULATORY_CARE_PROVIDER_SITE_OTHER): Payer: BLUE CROSS/BLUE SHIELD | Admitting: Rehabilitative and Restorative Service Providers"

## 2016-03-15 ENCOUNTER — Ambulatory Visit (INDEPENDENT_AMBULATORY_CARE_PROVIDER_SITE_OTHER): Payer: BLUE CROSS/BLUE SHIELD | Admitting: Rehabilitative and Restorative Service Providers"

## 2016-03-15 DIAGNOSIS — M25539 Pain in unspecified wrist: Principal | ICD-10-CM

## 2016-03-15 NOTE — Telephone Encounter (Signed)
Per RC, after 11/24 patient will need to obtain a new order from MD.

## 2016-03-15 NOTE — Interdisciplinary (Signed)
Occupational Therapy Daily Follow up Note            Referring Physician: Geralyn Corwin               Preferred [place]  Patient to continue therapy for     ICD-10-CM ICD-9-CM    1. Pain in wrist, unspecified laterality M25.539 719.43        Patient Goals  Patient Stated Goal: pain free activities bilateral hands            Occupational Therapy Plan and Progress Report       03/15/16 1200    Outpatient Treatment Plan    Amount of Treatment 1 time per day    Frequency of Treatment 1 time per week    Duration of Treatment 4 weeks      03/15/16 1200    Treatment Plan Discussion    Include in My Healthcare Myself;My healthcare team    Treatment Plan Discussion & Agreement Patient    Patient/Family Questions Yes - All questions asked & answered      03/15/16 1200    Subjective    Status since last treatment Pt reporting minimal pain in R wrist      03/15/16 1200    Outpatient Pain Assessment    OP Pain Numeric Pain Rating Scale      03/15/16 1200    Numeric Pain Rating Scale     Pain Intensity - rating at present 2    Pain Intensity - rating at worst  3    Pain Intensity- rating after treatment 0    Frequency  Intermittent    Description  Aching;Throbbing      03/15/16 1200    Activity Restrictions     Activity Restrictions None      03/15/16 1200    Assessment    Assessment Pt presenting with improved symptoms on R side now reporting increased pain on L side. Pt reported splint for R hand helped with pain management. Fabricated thumb spica and forearm based thumb spica for L hand. Reviewed stretches and splint wear. Pt demo good understanding of HEP.       03/15/16 1200    Treatment Provided Today    Payor Medicare/Private      03/15/16 1200    Therapeutic Procedures    Therapeutic Exercise (97110) Home Exercise Program (HEP) demonstration and performance;Range of motion exercises;Patient education    Location Upper extremity        Total TIMED Treatment (min) 30    Orthotic  Management/Training [phone]) Orthotic/splint fitting;Orthotic/splint fabrication;Patient education        Total TIMED Treatment (min) 15      03/15/16 1200    Treatment Time    Treatment Start Time 1030    Total TIMED Treatment (min) 45    Total Treatment Time (min) 45      03/15/16 1200    Focus for Next Treatment    Focus for Next Treatment Therapeutic exercise;Patient exercise program instruction;Graded tasks to increase function;Skilled techniques to improve muscle and tissue flexibility        Objective Findings: Pt seen for MHP and Paraffin to R hand increase tissue circulation. Reviewed tendon glides and light AROM R wrist. Fabricated L splint for Dequervain's prevent wrist and thumb deviation along with L thumb spica orthosis. Educated pt on wearing thumb spica during day and FA based thumb spica at night.

## 2016-03-22 ENCOUNTER — Ambulatory Visit (INDEPENDENT_AMBULATORY_CARE_PROVIDER_SITE_OTHER): Payer: BLUE CROSS/BLUE SHIELD | Admitting: Rehabilitative and Restorative Service Providers"

## 2016-03-22 DIAGNOSIS — M25539 Pain in unspecified wrist: Principal | ICD-10-CM

## 2016-03-22 NOTE — Interdisciplinary (Signed)
Occupational Therapy Daily Follow up Note            Referring Physician: Geralyn Corwin               Preferred [place]  Patient to continue therapy for     ICD-10-CM ICD-9-CM    1. Pain in wrist, unspecified laterality M25.539 719.43        Patient Goals  Patient Stated Goal: pain free activities bilateral hands            Occupational Therapy Plan and Progress Report       03/22/16 1100    Outpatient Treatment Plan    Amount of Treatment 1 time per day    Frequency of Treatment 1 time per week    Duration of Treatment 4 weeks      03/22/16 1100    Treatment Plan Discussion    Include in My Healthcare Myself;My healthcare team    Treatment Plan Discussion & Agreement Patient    Patient/Family Questions Yes - All questions asked & answered      03/22/16 1100    Subjective    Status since last treatment Pt reporting minimal pain in R wrist      03/22/16 1100    Outpatient Pain Assessment    OP Pain Numeric Pain Rating Scale      03/22/16 1100    Numeric Pain Rating Scale     Pain Intensity - rating at present 2    Pain Intensity - rating at worst  4    Pain Intensity- rating after treatment 0    Frequency  Intermittent    Description  Aching;Throbbing    Pain Description R wrist (radial and ulnar) and L wrist (ulnar side only)      03/22/16 1100    Activity Restrictions     Activity Restrictions None      03/22/16 1100    Assessment    Assessment Pt presenting with minimal pain in B hands, pain in R thumb and ulnar portion and L ulnar sided pain. Pt reporting L thumb spica brace has resolved thumb pain. Pt may benefit from R thumb spica FA splint for decreasing pain along 1st dorsal compartment.     Progress Towards Goals Patient has made moderate progress towards achievement of goals    Potential to improve with therapy Good      03/22/16 1100    Treatment Provided Today    Payor Medicare/Private      03/22/16 1100    Therapeutic Procedures    Manual Therapy (97140) Patient  education;Myofascial release        Total TIMED Treatment (min) 15    Therapeutic Exercise (97110) Home Exercise Program (HEP) demonstration and performance;Range of motion exercises;Patient education    Location Upper extremity        Total TIMED Treatment (min) 30      03/22/16 1100    Treatment Time    Treatment Start Time 1100    Total TIMED Treatment (min) 45    Total Treatment Time (min) 45      03/22/16 1100    Focus for Next Treatment    Focus for Next Treatment Therapeutic exercise;Patient exercise program instruction;Graded tasks to increase function;Skilled techniques to improve muscle and tissue flexibility        Objective Findings: Pt seen for MHP and Paraffin to B hands. Addressing R hand as more painful compared to L. STM to R  volar and dorsal FA for increasing tissue circulation, myofascial release along 1st dorsal compartment and ECRB/BR. Kinesiotaped B FA along 1st dorsal compartment to promote increased stability, increase tissue circulation and increased tissue extensibility.

## 2016-03-27 ENCOUNTER — Ambulatory Visit (INDEPENDENT_AMBULATORY_CARE_PROVIDER_SITE_OTHER): Payer: BLUE CROSS/BLUE SHIELD | Admitting: Rehabilitative and Restorative Service Providers"

## 2016-03-27 DIAGNOSIS — M25539 Pain in unspecified wrist: Principal | ICD-10-CM

## 2016-03-27 NOTE — Interdisciplinary (Signed)
Occupational Therapy Daily Follow up Note            Referring Physician: Geralyn Corwin               Preferred [place]  Patient to continue therapy for     ICD-10-CM ICD-9-CM    1. Pain in wrist, unspecified laterality M25.539 719.43        Patient Goals  Patient Stated Goal: pain free activities bilateral hands  Therapy Goals                                                                                        Current Level Goals for Episode of Care    Functional Deficit     Long term Functional Goal      Impairment #1   Short Term Goal #1     Impairment #2   Short Term Goal #2     Impairment #3   Short Term Goal #3     Impairment #4   Short Term Goal #4     Functional Limitation Reporting                   Occupational Therapy Plan and Progress Report       03/27/16 1400    Outpatient Treatment Plan    Amount of Treatment 1 time per day    Frequency of Treatment 1 time per week    Duration of Treatment 2 weeks      03/27/16 1400    Treatment Plan Discussion    Include in My Healthcare Myself;My healthcare team    Treatment Plan Discussion & Agreement Patient    Patient/Family Questions Yes - All questions asked & answered      03/27/16 1400    Subjective    Status since last treatment Pt stating R wrist feeling better but still symptomatic. Using splints      03/27/16 1400    Outpatient Pain Assessment    OP Pain Numeric Pain Rating Scale      03/27/16 1400    Numeric Pain Rating Scale     Pain Intensity - rating at present 2    Pain Intensity - rating at worst  4    Pain Intensity- rating after treatment 0    Frequency  Intermittent    Description  Aching;Throbbing      03/27/16 1400    Activity Restrictions     Activity Restrictions None      03/27/16 1400    Assessment    Assessment slow resolution of 1st dorsal compartment pain    Progress Towards Goals Patient showing improvement from baseline    Potential to improve with therapy Good      03/27/16 1400    Treatment Provided Today    Payor Medicare/Private      03/27/16 1400    Therapeutic Procedures    Manual Therapy (97140) Patient education;Myofascial release        Total TIMED Treatment (min) 15    Therapeutic Exercise (97110) Home Exercise Program (HEP) demonstration and performance;Range of motion exercises;Patient education    Location  Upper extremity        Total TIMED Treatment (min) 30      03/27/16 1400    Modalities    Hot/Cold Packs (97010) Moist hot pack application    Paraffin Bath [phone]) Completed      03/27/16 1400    Treatment Time    Treatment Start Time 1300    Total TIMED Treatment (min) 30    Total Treatment Time (min) 30      03/27/16 1400    Focus for Next Treatment    Focus for Next Treatment Therapeutic exercise;Patient exercise program instruction;Graded tasks to increase function;Skilled techniques to improve muscle and tissue flexibility        Objective Findings: Pt seen for MHP wit paraffin to wrist, A/Aa/PROM ex, massage to dorsal radial wrist    MFD performed over the 1st dorsal compartment with small cups. Patient educated on indications, contraindications, benefits, and possible side effects of MFD. Side effects including possibility of bruising, redness, and skin irritation that can last up to 1-2 weeks. Patient provided verbal consent to continue with treatment. Patient denies having any contraindications to MFD including: not currently on blood thinners, no malignancies, no impaired sensation, or anxiety to treatment. Possible benefits include relief of symptoms, decrease tension, and increased range of motion.

## 2016-04-03 ENCOUNTER — Ambulatory Visit (INDEPENDENT_AMBULATORY_CARE_PROVIDER_SITE_OTHER): Payer: BLUE CROSS/BLUE SHIELD | Admitting: Rehabilitative and Restorative Service Providers"

## 2016-04-03 DIAGNOSIS — M25539 Pain in unspecified wrist: Principal | ICD-10-CM

## 2016-04-03 NOTE — Interdisciplinary (Signed)
Occupational Therapy Daily Follow up Note            Referring Physician: Geralyn Corwin               Preferred [name]  Patient to continue therapy for     ICD-10-CM ICD-9-CM    1. Pain in wrist, unspecified laterality M25.539 719.43        Patient Goals  Patient Stated Goal: pain free activities bilateral hands  Therapy Goals                                                                                        Current Level Goals for Episode of Care    Functional Deficit     Long term Functional Goal      Impairment #1   Short Term Goal #1     Impairment #2   Short Term Goal #2     Impairment #3   Short Term Goal #3     Impairment #4   Short Term Goal #4     Functional Limitation Reporting                   Occupational Therapy Plan and Progress Report       04/03/16 1400    Outpatient Treatment Plan    Amount of Treatment 1 time per day    Frequency of Treatment 1 time per week    Duration of Treatment 1 week      04/03/16 1400    Treatment Plan Discussion    Include in My Healthcare Myself;My healthcare team    Treatment Plan Discussion & Agreement Patient    Patient/Family Questions Yes - All questions asked & answered      04/03/16 1400    Subjective    Status since last treatment continues to feel better with less dorsal radial wrist pain      04/03/16 1400    Outpatient Pain Assessment    OP Pain Numeric Pain Rating Scale      04/03/16 1400    Numeric Pain Rating Scale     Pain Intensity - rating at present 1    Pain Intensity - rating at worst  1    Pain Intensity- rating after treatment 0    Frequency  Intermittent    Description  Aching;Throbbing    Pain Description diffuse forearm pain,       04/03/16 1400    Activity Restrictions     Activity Restrictions None      04/03/16 1400    Assessment    Assessment no first dorsal compartment pain, more diffuse to forearms, appears to be resolving    Progress Towards Goals Patient has made significant progress towards  achievement of goals    Potential to improve with therapy Good      04/03/16 1400    Treatment Provided Today    Payor Medicare/Private      04/03/16 1400    Therapeutic Procedures    Manual Therapy (97140) Patient education;Myofascial release        Total TIMED Treatment (min) 15  Therapeutic Exercise (97110) Home Exercise Program (HEP) demonstration and performance;Range of motion exercises;Patient education    Location Upper extremity        Total TIMED Treatment (min) 30      04/03/16 1400    Modalities    Hot/Cold Packs (97010) Moist hot pack application    Paraffin Bath [phone]) Completed      04/03/16 1400    Treatment Time    Treatment Start Time 1200    Total TIMED Treatment (min) 30    Total Treatment Time (min) 30      04/03/16 1400    Focus for Next Treatment    Focus for Next Treatment Therapeutic exercise;Patient exercise program instruction;Graded tasks to increase function;Skilled techniques to improve muscle and tissue flexibility        Objective Findings: MHP with paraffin to wrists, deep tissue massage, stretching ex followed by K tape

## 2016-04-08 ENCOUNTER — Encounter (INDEPENDENT_AMBULATORY_CARE_PROVIDER_SITE_OTHER): Payer: BLUE CROSS/BLUE SHIELD | Admitting: Rehabilitative and Restorative Service Providers"

## 2016-04-08 ENCOUNTER — Telehealth: Payer: Self-pay

## 2016-04-08 NOTE — Telephone Encounter (Signed)
Cxl appt for OT Therapy per pt's request. She can't come today. She'll get a new referral from her MD in order to schedule future appts.  Informed therapist.

## 2016-08-20 ENCOUNTER — Other Ambulatory Visit (INDEPENDENT_AMBULATORY_CARE_PROVIDER_SITE_OTHER): Payer: Self-pay | Admitting: Internal Medicine

## 2016-08-20 DIAGNOSIS — E039 Hypothyroidism, unspecified: Principal | ICD-10-CM

## 2016-08-20 MED ORDER — LEVOTHYROXINE SODIUM 125 MCG OR TABS
125.0000 ug | ORAL_TABLET | ORAL | 1 refills | Status: DC
Start: 2016-08-20 — End: 2017-01-22

## 2016-08-20 MED ORDER — LEVOTHYROXINE SODIUM 112 MCG OR TABS
112.0000 ug | ORAL_TABLET | ORAL | 1 refills | Status: DC
Start: 2016-08-20 — End: 2017-01-22

## 2016-08-20 NOTE — Telephone Encounter (Signed)
Patient is requesting refill of medication, please see orders for preloaded prescription refill.    Requested Prescriptions     Pending Prescriptions Disp Refills    levothyroxine (SYNTHROID) 112 MCG tablet 45 tablet 1     Sig: Take 1 tablet (112 mcg) by mouth every other day. Alternating with 125 mcg tablet every other day    levothyroxine (SYNTHROID) 125 MCG tablet 45 tablet 1     Sig: Take 1 tablet (125 mcg) by mouth every other day. Alternating with 112 mcg tablet every other day.       LOV:   11/29/2015  NOV:   Visit date not found  Last RX fill:   05/19/2016    Last Thyroid Labs:  Lab Results   Component Value Date    TSH 1.57 09/06/2015    FREET4 1.82 11/25/2014

## 2016-08-27 ENCOUNTER — Other Ambulatory Visit: Payer: Self-pay | Admitting: Obstetrics & Gynecology

## 2016-08-27 DIAGNOSIS — Z1231 Encounter for screening mammogram for malignant neoplasm of breast: Principal | ICD-10-CM

## 2016-09-03 ENCOUNTER — Other Ambulatory Visit (INDEPENDENT_AMBULATORY_CARE_PROVIDER_SITE_OTHER): Payer: Self-pay | Admitting: Obstetrics & Gynecology

## 2016-09-03 DIAGNOSIS — N920 Excessive and frequent menstruation with regular cycle: Principal | ICD-10-CM

## 2016-09-03 MED ORDER — LEVONORGESTREL-ETHINYL ESTRAD 0.1-20 MG-MCG OR TABS
1.0000 | ORAL_TABLET | Freq: Every day | ORAL | 3 refills | Status: DC
Start: 2016-09-03 — End: 2017-08-08

## 2016-09-03 NOTE — Telephone Encounter (Signed)
Dr. [name],    Pt's pharmacy faxed a refill request for Aviane. Pt was last seen 12/14/2015. Please review

## 2016-09-03 NOTE — Telephone Encounter (Signed)
I called pt to let her know that refill was approved.

## 2016-09-10 ENCOUNTER — Other Ambulatory Visit (INDEPENDENT_AMBULATORY_CARE_PROVIDER_SITE_OTHER): Payer: BLUE CROSS/BLUE SHIELD | Attending: Internal Medicine

## 2016-09-10 DIAGNOSIS — E039 Hypothyroidism, unspecified: Principal | ICD-10-CM

## 2016-09-10 LAB — TSH, BLOOD: TSH: 1.65 u[IU]/mL (ref 0.27–4.20)

## 2016-09-10 NOTE — Interdisciplinary (Signed)
Blood drawn from left arm with 23 gauge needle. 1 tubes taken.   Patient identity authenticated by Laurence J Orgaya.

## 2016-09-12 ENCOUNTER — Ambulatory Visit (INDEPENDENT_AMBULATORY_CARE_PROVIDER_SITE_OTHER): Payer: BLUE CROSS/BLUE SHIELD | Admitting: Internal Medicine

## 2016-09-12 VITALS — BP 120/80 | HR 90 | Temp 98.4°F | Resp 20 | Ht 69.0 in | Wt 181.1 lb

## 2016-09-12 DIAGNOSIS — Z0000 Encounter for general adult medical examination without abnormal findings: Principal | ICD-10-CM

## 2016-09-12 DIAGNOSIS — E039 Hypothyroidism, unspecified: Secondary | ICD-10-CM

## 2016-09-12 DIAGNOSIS — E785 Hyperlipidemia, unspecified: Secondary | ICD-10-CM

## 2016-09-12 DIAGNOSIS — Z1389 Encounter for screening for other disorder: Secondary | ICD-10-CM

## 2016-09-12 DIAGNOSIS — Z1331 Encounter for screening for depression: Secondary | ICD-10-CM

## 2016-09-12 NOTE — Patient Instructions (Signed)
Your lab tests should be done fasting.  These should be done 1 week before your next visit.

## 2016-09-12 NOTE — Progress Notes (Signed)
The patient returns for her complete annual physical examination.  She states she is doing well with no significant medical illnesses or complaints except for some difficulty sleeping at times.  She is not taking Ambien since she has side effects from it the next day.    Past medical history:    Past Medical History:   Diagnosis Date    Hypothyroidism     h/o hyperthyroidism s/p radioactive iodine    Infectious mononucleosis 08/2006    Insomnia     Menorrhagia        Current Outpatient Prescriptions   Medication Sig    cetirizine (ZYRTEC) 10 MG tablet Take 10 mg by mouth daily.    cyclobenzaprine (FLEXERIL) 5 MG tablet Take 1 tablet (5 mg) by mouth 3 times daily as needed for Muscle Spasms.    diclofenac (VOLTAREN) 1 % gel Apply 2 g topically 4 times daily.    fluticasone propionate (FLONASE) 50 MCG/ACT nasal spray Spray 1 spray into each nostril 2 times daily.    ibuprofen (MOTRIN) 800 MG tablet Take 1 tablet (800 mg) by mouth every 8 hours as needed for Mild Pain (Pain Score 1-3) or Moderate Pain (Pain Score 4-6).    levonorgestrel-ethinyl estradiol (AVIANE) 0.1-20 MG-MCG tablet Take 1 tablet by mouth daily.    levothyroxine (SYNTHROID) 112 MCG tablet Take 1 tablet (112 mcg) by mouth every other day. Alternating with 125 mcg tablet every other day    levothyroxine (SYNTHROID) 125 MCG tablet Take 1 tablet (125 mcg) by mouth every other day. Alternating with 112 mcg tablet every other day.    tretinoin (RETIN-A) 0.025 % cream Apply a thin layer at bedtime as directed     No current facility-administered medications for this visit.        Allergies   Allergen Reactions    Vicodin [Hydrocodone-Acetaminophen] Hallucinations       Family history:    Family History   Problem Relation Age of Onset    Breast Cancer Mother     Other Sister      Uterine fibroids/hysterectomy    Crohn's Disease Sister     Breast Cancer Sister     Ovarian Cancer Neg Hx      but sister w/ovarian cysts age 56       Social  history:    Social History   Substance Use Topics    Smoking status: Never Smoker    Smokeless tobacco: Never Used    Alcohol use Yes      Comment: 1 bottle wine /week       Review of systems:     The patient denies changes in visual acuity, diplopia or amaurosis, no discharge, matting, redness, tearing or eye pain.    She denies a history of hearing loss, ear pain, tinnitus or aural discharge.    Patient denies sore throat, dental pain, hoarseness, dysphagia, oral or tongue lesions.    No pain or swelling in the neck. No swollen glands. No pain with motion of the neck.    No symptoms of hypo or hyperthyroidism: no decreased or increased weight, no feeling cold/chilly or excessively warm, no diarrhea or constipation, no undue sweatiness, anxiety or palpitations.    The patient denies cough, chest pain, dyspnea, wheezing or hemoptysis.    Patient denies any exertional chest pain, dyspnea, palpitations, syncope, orthopnea, edema or paroxysmal nocturnal dyspnea.    The patient denies abdominal or flank pain, anorexia, nausea or vomiting, dysphagia, change in bowel  habits or black or bloody stools or weight loss.  Colonoscopy:  Due at this time since she is age 27, but she wishes to put it off for now due to her time constraints.  She understands the risks of undiagnosed carcinoma.    The patient denies swelling, numbness, tingling or weakness in the extremities.    She denies symptoms of joint pain, swelling, myalgias or back pain.    She has noted no swollen glands in neck, axillary or inguinal areas, no fevers, weight loss, night sweats or pruritus. There is no family history of lymphoreticular neoplasia.    The patient denies dysuria, frequency or hematuria.    The patient denies any symptoms of neurological impairment or TIAs; no amaurosis, diplopia, dysphasia, or unilateral disturbance of motor or sensory function. No loss of balance or vertigo.      PE--    She appears well, in no apparent distress.  Alert and  oriented times three, pleasant and cooperative. Vital signs are as noted by the nurse.    Vitals:    09/12/16 0747   BP: 120/80   BP Location: Left arm   BP Patient Position: Sitting   BP cuff size: Large   Pulse: 90   Resp: 20   Temp: 98.4 F (36.9 C)   TempSrc: Oral   SpO2: 98%   Weight: 82.2 kg (181 lb 1.9 oz)   Height: 5\' 9"  (1.753 m)       Eye exam is normal - PERLA, EOMI, fundi normal, corneas normal, no foreign bodies, visual acuity normal both eyes.    Ear exam - both sides normal, TM intact without perforation or effusion, external canal normal. No significant ceruminosis noted.    Throat exam normal. Oral cavity, tongue, pharynx and palate have no inflammation or suspicious lesions. Tonsils - tonsils are present and normal. Teeth normal without tenderness.    The neck is supple and free of adenopathy or masses, the thyroid is normal without enlargement or nodules.    Thyroid not palpable, not enlarged, no nodules detected.    Chest is clear, no wheezing or rales. Normal symmetric air entry throughout both lung fields. No chest wall deformities or tenderness.    S1 and S2 normal, no murmurs, clicks, gallops or rubs. Regular rate and rhythm. Chest is clear; no wheezes or rales. No edema or JVD.    The abdomen is soft without tenderness, guarding, mass, rebound or organomegaly. Bowel sounds are normal. No CVA tenderness or inguinal adenopathy noted.    Extremities: extremities, peripheral pulses and reflexes normal, no edema, redness or tenderness in the calves or thighs, feet normal, good pulses, normal color, temperature and sensation.    A general joint exam is normal with full range of motion of spine, shoulders, elbows, wrists, fingers, hips, knees and ankles; no active swelling, tenderness or synovitis at any joint. No soft tissue nodules.    Cranial nerves are normal. Fundi are normal with sharp disc margins, no papilledema, hemorrhages or exudates noted. PERLA. EOMs intact. Neck supple. No cranial or  carotid bruits.  Good carotid upstroke.  DTRs, motor power and sensation normal and symmetric. Babinski sign absent.  Mental status normal.  Gait and station normal.  Cerebellar function is normal.    Lab result are reviewed and are as below:    Results for orders placed or performed in visit on 09/10/16   TSH, Blood Green Plasma Separator Tube   Result Value Ref Range  TSH 1.65 0.27 - 4.20 uIU/mL       A/P--      ICD-10-CM ICD-9-CM    1. Preventative health care--normal physical examination. Z00.00 V70.0    2. Screened negative for depression Z13.89 V79.0    3. Acquired hypothyroidism E03.9 244.9 TSH, Blood - See Instructions   4. Hyperlipidemia, unspecified hyperlipidemia type E78.5 272.4 Comprehensive Metabolic Panel - See Instructions      Lipid Panel Green Plasma Separator Tube

## 2016-09-13 ENCOUNTER — Ambulatory Visit
Admission: RE | Admit: 2016-09-13 | Discharge: 2016-09-13 | Disposition: A | Discharge status: Home / Self Care | Payer: BLUE CROSS/BLUE SHIELD | Attending: Diagnostic Radiology | Admitting: Diagnostic Radiology

## 2016-09-13 DIAGNOSIS — Z1231 Encounter for screening mammogram for malignant neoplasm of breast: Principal | ICD-10-CM | POA: Insufficient documentation

## 2016-10-22 ENCOUNTER — Encounter (INDEPENDENT_AMBULATORY_CARE_PROVIDER_SITE_OTHER): Payer: Self-pay | Admitting: Internal Medicine

## 2016-10-22 DIAGNOSIS — H8309 Labyrinthitis, unspecified ear: Principal | ICD-10-CM

## 2016-10-22 NOTE — Telephone Encounter (Signed)
From: [name]  To: [name], MD  Sent: 10/22/2016 2:05 PM PDT  Subject: 1-Non Urgent Medical Advice    Hello [name],    I have had cold sores on both sides of my mouth for over 3 weeks. Would you be able to send a prescription to the [facility] on [address] or should I schedule an appointment?    Thank you.  Brittany [name]

## 2016-10-22 NOTE — Telephone Encounter (Signed)
Forwarded to Dr Sammuel Hines for advise. I have teed up Valtrex.

## 2016-10-23 MED ORDER — VALACYCLOVIR HCL 1000 MG OR TABS
1000.0000 mg | ORAL_TABLET | Freq: Two times a day (BID) | ORAL | 2 refills | Status: DC
Start: 2016-10-23 — End: 2017-11-24

## 2016-12-16 ENCOUNTER — Ambulatory Visit (INDEPENDENT_AMBULATORY_CARE_PROVIDER_SITE_OTHER): Payer: BLUE CROSS/BLUE SHIELD | Admitting: Dermatology

## 2016-12-16 DIAGNOSIS — I781 Nevus, non-neoplastic: Secondary | ICD-10-CM

## 2016-12-16 DIAGNOSIS — Z1283 Encounter for screening for malignant neoplasm of skin: Secondary | ICD-10-CM

## 2016-12-16 DIAGNOSIS — L814 Other melanin hyperpigmentation: Secondary | ICD-10-CM

## 2016-12-16 DIAGNOSIS — B372 Candidiasis of skin and nail: Principal | ICD-10-CM

## 2016-12-16 DIAGNOSIS — D239 Other benign neoplasm of skin, unspecified: Secondary | ICD-10-CM

## 2016-12-16 DIAGNOSIS — D1801 Hemangioma of skin and subcutaneous tissue: Secondary | ICD-10-CM

## 2016-12-16 DIAGNOSIS — L578 Other skin changes due to chronic exposure to nonionizing radiation: Secondary | ICD-10-CM

## 2016-12-16 DIAGNOSIS — L821 Other seborrheic keratosis: Secondary | ICD-10-CM

## 2016-12-16 DIAGNOSIS — D229 Melanocytic nevi, unspecified: Secondary | ICD-10-CM

## 2016-12-16 MED ORDER — CLOTRIMAZOLE 1 % EX CREA
1.0000 | TOPICAL_CREAM | Freq: Every day | CUTANEOUS | 3 refills | Status: DC
Start: 2016-12-16 — End: 2020-02-25

## 2016-12-16 MED ORDER — METRONIDAZOLE 1 % EX GEL
1.0000 | Freq: Every day | CUTANEOUS | 3 refills | Status: DC
Start: 2016-12-16 — End: 2020-02-01

## 2016-12-16 NOTE — Progress Notes (Signed)
Attending Note:    Subjective:    I reviewed the history.  Patient interviewed and examined.  History of present illness (HPI): as per the resident note.  Review of Systems (ROS): as per the resident note.  Past Medical, Family, Social History: as per the resident note.    Objective:    I have examined the patient and I concur with the resident's exam.    Assessment and Plan: reviewed with the resident physician.  I agree with the resident's plan as documented.    See the resident note for further details.

## 2016-12-16 NOTE — Progress Notes (Signed)
Primary MD: [name]  Consult Requested By: Self, Referred    HISTORY:  Brittany [name] is a 51 year old female with no history of skin cancer who presents for evaluation of lesions due to chronic sun damage. Patient was last seen on 03/21/15 by Dr. Jerene Dilling, at which time patient was prescribed tretinoin cream for acne.    Today patient has the following skin concerns: recurrent red bumps on the chin and at the bilateral commissures of her lips. Patient has tried using tretinoin cream and neosporin on the area but has noticed little to no improvement. She denies any associated burning, pruritus, or pain. Patient otherwise has no other concerns and denies any areas with ulceration, growth, bleeding, itching or pain.    Past Medical History:  The patient's dermatologic medical history is negative for melanoma, negative for NMSC    The patient  has a past medical history of Hypothyroidism; Infectious mononucleosis (08/2006); Insomnia; and Menorrhagia.  The medications were reviewed.    Family History  The patient's dermatologic family history is negative for melanoma, and positive for other skin disease (Mother with NMSC).        Allergies: Vicodin [hydrocodone-acetaminophen]    REVIEW OF SYSTEMS:  CONSTITUTIONAL: negative for fever or chills  DERMATOLOGIC: Feels well, no other skin complaints.    PHYSICAL EXAM:  Skin Type:                    2  General Appearance: within normal limits  Neuro: Alert and oriented x 3  Psych: Mood and affect within normal limits  Eyes: Inspection of lids and conjunctiva within normal limits  Cardiovascular: Inspection of peripheral vascular system showed no swelling, edema.   Extremities: inspection of digits and nails wnl     Skin: The areas examined included scalp, face, neck, RUE, LUE, chest, abdomen, back, RLE, LLE, buttock, groin.  All was normal except for the following:  - Approx 41mm pink brown papule with central white stellate scar and + dimple sign on right leg  -  Multiple brown, <75mm, smooth bordered macules and papules scattered over body  - Hyperpigmented skin with accentuation of skin lines in sunexposed areas  - On the trunk and extremities are 2-10 mm 'stuck-on' tan-brown and brown papules with verrucous/waxy texture  - Multiple bright pink/red papules scattered on the trunk  - Erythematous papules at the bilateral commissures of lips and along marionette lines      ASSESSMENT AND TREATMENT PLAN:  Brittany [name] was seen today for recheck.    Diagnoses and all orders for this visit:    1. Candidal dermatitis, at commissures of lips and along marionette lines  - Prescribed clotrimazole (LOTRIMIN) 1 % cream; Apply 1 Application topically daily.   - Prescribed metroNIDAZOLE (METROGEL) 1 % gel; Apply 1 Application topically daily.   - Counseled patient on side effects and proper use of these medications  - Recommended patient treat daily for 6 weeks and then may reduce to 2-3x per week of maintenance if needed - if area does not resolve after 6 weeks of consistent therapy, then recommended patient send a MyChart message or call us with an update    2. Multiple benign nevi  - Reassurance  - Pt instructed to return to clinic if any changes in color, size shape  - Reviewed ABCDE's of melanoma  - Advised pt on sun protection    3. Sun Damaged Skin and Skin Health Maintenance    -  Importance of TBSE by physician reviewed with patient  - Discussed importance of sun protection, including avoidance of sun at peak hours (10 am-4 pm), protective clothing, and use of sunscreen with SPF 30 or greater with frequent reapplication (every 1 to 1.5 hours, or more frequently if in water or if heavy sweating).  Best sunscreens use zinc or titanium oxide as the active ingredient and act as a physical blocker.     4. Seborrheic keratoses  5. Lentigines   -Patient reassured of benign nature.    6. Dermatofibroma  -Patient reassured of benign nature.    7. Cherry angiomas  -Patient reassured of benign  nature.    - Diagnoses, natural course, treatments and risks were discussed with the patient.  Return in about 1 year (around 12/16/2017) for TBSE.   PRN sooner           Note written by resident: Carolyne Littles, MD.

## 2016-12-16 NOTE — Patient Instructions (Addendum)
[facility]    - apply metronidazole gel and clotrimazole cream once per day (use one in the morning and the other at night) on the red bumps around your lips/chin for at least 6 weeks - once this area clears, you can reduce to treating 2-3x per weeks    MOLES AND MELANOMAS      Moles (nevi) are tan or brown, raised or flat areas of the skin which have an increased number of melanocytes. Melanocytes are the cells in our body which make pigment and account for skin color.    Some moles are present at birth (congenital nevi), while others come up later in life (acquired nevi).  The sun can stimulate the body to make more moles.  Sunburns are not the only thing that triggers more moles.  Chronic sun exposure can do it too.     Malignant melanoma is a type of skin cancer that can be deadly if it spreads throughout the body. The incidence of melanoma in the [place] is growing rapidly.  Melanoma usually grows near the surface of the skin for a period of time, and then begins to grow deeper into the skin. Once it grows deeper into the skin, the risk of spread to other organs greatly increases. Therefore, early detection and removal of a malignant melanoma can result in a complete cure; removal after the tumor has spread may not be effective.    Melanoma can often be suspected by its appearance. The ABCDE's of melanoma are:    Asymmetry: Asymmetry means if you draw a line through the mole, the two halves do not match in color, size, shape, or surface texture. Asymmetry can be a result of rapid enlargement of a mole, the development of a raised area on a previously flat lesion, scaling, ulceration, bleeding or scabbing within the mole.    Border: The border of a melanoma often blends into the normal skin and does not sharply delineate the mole from normal skin.    Color: Different colors within a mole, or the development of dark black, blue, or red areas in a preexisting mole.    Diameter: Size greater than 0.6cm  (1/4 of an inch, or the size of a pencil eraser). This is only a guideline, and many normal moles may be this large or even a bit larger.    Evolving: Any change -- in size, shape, color, elevation, or another trait, or any new symptom such as bleeding, itching or crusting    Melanomas should be considered when a mole suddenly appears or changes. Itching, burning, or pain in a pigmented lesion should cause suspicion, but most patients with early melanoma have no skin discomfort whatsoever.  The appearance of a mole remains the most reliable method for identifying a malignant melanoma. Suspicious-looking moles may be removed for microscopic examination.    Melanoma can occur anywhere on the skin, including areas that are difficult for self-examination. Many melanomas are first noticed by other family members.      Occasionally, melanomas appear as rapidly growing, blue-black, dome-shaped bumps within a previous mole or previous area of normal skin    Dysplastic moles are moles that fit the ABCDE rules of melanoma, but are not melanomas when examined under the microscope.  They may indicate an increased risk of melanoma in that person. If there is a family history of melanoma, most experts agree that the person is at an increased risk for developing a melanoma.  Experts still do  not agree on what dysplastic moles mean in patients without a personal or family history of melanoma.  Dysplastic moles are usually larger than common moles and may have different colors within them with irregular borders. The appearance can be very similar to a melanoma. Biopsies of dysplastic moles may show abnormalities which are different from a regular mole.      You should take steps to decrease you risk of melanoma.   First, avoid the sun and protect your skin when it is exposed to the sun.  People who have lived near the equator, people who have intermittent exposures to large amounts of sun, and people who have had sunburns in  childhood or adolescence have an increased risk for melanoma. Sun sense and sun protection are key to preventing melanoma.    Secondly, moles should be looked at regularly.  Melanomas can be diagnosed early by self-examinations at home.  By looking for the ABCDE's of moles once a month, you should notice if any changes have occurred and if so, you should make an appointment earlier than your next check-up in order to be examined.  On the other hand, dont check your moles more than once a month or you might not notice a change.  [facility]    SUN PROTECTION      Why protect against the sun?  In the past, sun exposure was thought to be a healthy benefit of outdoor activity. However, studies have shown many unhealthy effects of sun exposure, such as early aging of the skin and skin cancer.  What kind of damage does sun exposure cause?  Part of the sun's energy that reaches earth is composed of rays of invisible ultraviolet (UV) light. When ultraviolet light rays (UVA and UVB) enter the skin, they damage skin cells, causing visible and invisible injuries.  Sunburn is a visible type of damage, which appears just a few hours after sun exposure. In many people, this type of damage also causes tanning. Freckles, which occur in people with fair skin, are usually due to sun exposure. Freckles are nearly always a sign that sun damage has occurred, and therefore show the need for sun protection.  Ultraviolet light rays also cause invisible damage to skin cells. Some of the injury is repaired, but some of the cell damage adds up year after year. After 20 to 30 years or more, the built-up damage appears as wrinkles, age spots, and even skin cancer. Although window glass blocks UVB light, UVA rays are able to penetrate through glass.  How can I protect myself from excessive sun exposure?   Avoidance. Stay away from the sun in the middle of the day.  Nancy Fetter exposure is more intense closer to the equator, in the mountains, and  in the summer. The sun's damaging effects are increased by reflection from water, white sand, and snow. Avoid long periods of direct sun exposure. Sit or play in the shade, especially when your shadow is shorter than you are tall.   Use sun protective clothing.  Cover up with light colored clothing when outdoors, including a hat to protect the scalp and face. In addition to filtering out the sun, tightly woven clothing reflects heat and helps keep you feeling cool. Sunglasses that block ultraviolet rays protect the eyes and eyelids. Multiple retailers now sell sun protective clothing for adults and children.  Rash guards should be worn during outdoor swimming activity.   Block sun damage by applying a broad-spectrum UVA and UVB sunscreen  with an SPF of 30 or higher and reapply approximately every two hours, even on cloudy days. If swimming or participating in intense physical activity, sunscreen may need to be applied more often.  How do I select the right sunscreen?    Choose a sunscreen with a SPF 30 or higher. The protective ability of sunscreen is rated by Nancy Fetter Protection Factor (SPF) -- the higher the SPF, the stronger the protection. Spread it evenly over all uncovered skin, including ears and lips, but avoid the eyelids. Apply sunscreen about 30 minutes before sun exposure. Re-apply after swimming or excessive sweating.    Most importantly, choose a sunscreen that you will wear.  New sunscreens are added to the marketplace frequently, and selection of a particular brand is often a matter of personal preference.       What about the controversies regarding sunscreens?    Hats, clothing, and shade are the most reliable forms of sun protection.  Few people use enough sunscreen to benefit from the [facility] protection listed on the label. Most practitioners in our group continue to recommend and utilize many sunscreen products, including chemical and physical sunscreens.  However, studies show that people typically  use about a quarter of the recommended amount.     There has been much news coverage recently about the possible dangers of sunscreens. The Environmental Working Group publishes an annual sunscreen report.  The 2010 report, which received a considerable amount of media attention, cited concerns about a form of vitamin A called retinyl palmitate, found in many sunscreens. Safety testing is far from conclusive, and the FDA continues to investigate whether this compound may accelerate skin damage and elevate skin cancer risk when applied to skin exposed to sunlight.      Many have also raised concerns about chemical sunscreens, which contain substances such as oxybenzone, avobenzone, benzophenone, and parsol 1789. Some believe that these may be converted into hormones when absorbed through the skin. Some prefer physical agents such as zinc oxide or titanium dioxide, however efforts to make these agents more sheer and cosmetically acceptable have resulted in formulations that may contain tiny particles known as nanoparticles.  Concerns also exist about their absorption.  Most of the above concerns are theoretical; however, if you are highly concerned about these issues, you can visit the Environmental Working Groups website devoted to sunscreen safety: http://www.marquez-love.com/ sunscreen.   Most dermatologists remain convinced that the risks of unprotected sun exposure far outweigh the above mentioned theoretical risks of sunscreens. The type of sunprotection used remains an individual choice for each parent.    What about vitamin D?    Vitamin D is essential for many processes in the body, and is important for bone growth in children.  Over the last few years, many studies have suggested an association between low vitamin D levels and increased risk of certain types of cancers, neurologic disease, autoimmune disease and cardiovascular disease.  While dermatologists agree that the sun is certainly a source of vitamin D, we  are also uniquely aware it is also a source of harmful ultraviolet radiation resulting in thousands of skin cancers each year.  The official recommendation of the American Academy of Dermatology (AAD) is that vitamin D should be obtained through dietary sources and supplementation rather than from sunlight (ultraviolet radiation).

## 2017-01-22 ENCOUNTER — Other Ambulatory Visit (INDEPENDENT_AMBULATORY_CARE_PROVIDER_SITE_OTHER): Payer: Self-pay | Admitting: Internal Medicine

## 2017-01-22 DIAGNOSIS — E039 Hypothyroidism, unspecified: Principal | ICD-10-CM

## 2017-01-22 MED ORDER — LEVOTHYROXINE SODIUM 112 MCG OR TABS
112.0000 ug | ORAL_TABLET | ORAL | 1 refills | Status: DC
Start: 2017-01-22 — End: 2017-08-19

## 2017-01-22 MED ORDER — LEVOTHYROXINE SODIUM 125 MCG OR TABS
125.0000 ug | ORAL_TABLET | ORAL | 1 refills | Status: DC
Start: 2017-01-22 — End: 2017-08-19

## 2017-01-22 NOTE — Telephone Encounter (Signed)
Patient is requesting refill of medication, please see orders for preloaded prescription refill.    Requested Prescriptions     Pending Prescriptions Disp Refills    levothyroxine (SYNTHROID) 112 MCG tablet 45 tablet 1     Sig: Take 1 tablet (112 mcg) by mouth every other day. Alternating with 125 mcg tablet every other day    levothyroxine (SYNTHROID) 125 MCG tablet 45 tablet 1     Sig: Take 1 tablet (125 mcg) by mouth every other day. Alternating with 112 mcg tablet every other day.       LOV:   09/12/2016  NOV:   Visit date not found  Last RX fill:  08/20/2016    Last Thyroid Labs:  Lab Results   Component Value Date    TSH 1.65 09/10/2016    FREET4 1.82 (H) 11/25/2014

## 2017-01-22 NOTE — Telephone Encounter (Signed)
Duplicate encounter for Synthroid. Please closed. Thank you.  Patient is requesting refill of medication, please see orders for preloaded prescription refill.    Requested Prescriptions     Pending Prescriptions Disp Refills    SYNTHROID 125 MCG tablet [Pharmacy Med Name: SYNTHROID 125MCG    TAB ABBV] 45 tablet 0     Sig: TAKE 1 TABLET (125 MCG) BY MOUTH EVERY OTHER DAY. ALTERNATING WITH 112 MCG TABLET EVERY OTHER DAY.    SYNTHROID 112 MCG tablet [Pharmacy Med Name: SYNTHROID 112MCG    TAB ABBV] 45 tablet 0     Sig: TAKE 1 TABLET (112 MCG) BY MOUTH EVERY OTHER DAY. ALTERNATING WITH 125 MCG TABLET EVERY OTHER DAY       LOV:   09/12/2016  NOV:   01/22/2017  Last RX fill:  01/22/2017    Last Thyroid Labs:  Lab Results   Component Value Date    TSH 1.65 09/10/2016    FREET4 1.82 (H) 11/25/2014

## 2017-01-22 NOTE — Telephone Encounter (Signed)
From: [name]  To: [name], MD  Sent: 01/22/2017 9:08 AM PDT  Subject: Medication Renewal Request    Original authorizing provider: [name], MD    Adele Schilder would like a refill of the following medications:  levothyroxine (SYNTHROID) 112 MCG tablet [name], MD]  levothyroxine (SYNTHROID) 125 MCG tablet [name], MD]    Preferred pharmacy: [facility] [address] [place], [place] - [phone]    Comment:

## 2017-01-23 NOTE — Telephone Encounter (Signed)
Refused Medications  SYNTHROID 125MCG TAB ABBV  Will file in chart as: SYNTHROID 125 MCG tablet  TAKE 1 TABLET (125 MCG) BY MOUTH EVERY OTHER DAY. ALTERNATING WITH 112 MCG TABLET EVERY OTHER DAY.       Disp: 45 tablet Refills: 0    Class: ePrescribe Start: 01/22/2017   Refused by: [name], MD  Refusal reason: Request already responded to by other means.  SYNTHROID 112MCG TAB ABBV  Will file in chart as: SYNTHROID 112 MCG tablet  TAKE 1 TABLET (112 MCG) BY MOUTH EVERY OTHER DAY. ALTERNATING WITH 125 MCG TABLET EVERY OTHER DAY       Disp: 45 tablet Refills: 0    Class: ePrescribe Start: 01/22/2017   Refused by: [name], MD  Refusal reason: Request already responded to by other means.

## 2017-02-03 ENCOUNTER — Encounter (INDEPENDENT_AMBULATORY_CARE_PROVIDER_SITE_OTHER): Payer: Self-pay | Admitting: Obstetrics & Gynecology

## 2017-02-03 NOTE — Telephone Encounter (Signed)
Spoke   To pt,    appt   Scheduled     9/20   0930 am   With Kandice Moos   At   [place],   Address    Given.

## 2017-02-03 NOTE — Telephone Encounter (Signed)
From: [name]  To: Lenna Sciara, MD  Sent: 02/03/2017 5:57 AM PDT  Subject: 2-Procedural Question    Hello Dr. [name],  I felt a sharp pain under my right arm, and noticed the lump of fat there seems to be bigger. Given my mother's history of breast cancer, am requesting to be seen for an exam.    I had a regular mammogram on April 27 of this year. Start of current period is 9-15.  Thank you.   Brittany [name]

## 2017-02-06 ENCOUNTER — Ambulatory Visit (INDEPENDENT_AMBULATORY_CARE_PROVIDER_SITE_OTHER): Payer: BLUE CROSS/BLUE SHIELD | Admitting: Obstetrics & Gynecology

## 2017-02-06 ENCOUNTER — Encounter (INDEPENDENT_AMBULATORY_CARE_PROVIDER_SITE_OTHER): Payer: Self-pay | Admitting: Obstetrics & Gynecology

## 2017-02-06 VITALS — BP 115/76 | HR 77 | Temp 98.2°F | Ht 69.0 in | Wt 181.0 lb

## 2017-02-06 DIAGNOSIS — Z803 Family history of malignant neoplasm of breast: Secondary | ICD-10-CM

## 2017-02-06 DIAGNOSIS — N644 Mastodynia: Principal | ICD-10-CM

## 2017-02-06 DIAGNOSIS — Z1239 Encounter for other screening for malignant neoplasm of breast: Principal | ICD-10-CM

## 2017-02-06 NOTE — Patient Instructions (Signed)
Please call breast imaging to schedule mammogram and ultrasound.    Please call genetics to discuss consultation.     Will reach out to you with results.    Adair Laundry, NP

## 2017-02-06 NOTE — Telephone Encounter (Signed)
From: [name]  To: Lenna Sciara, MD  Sent: 02/06/2017 12:40 PM PDT  Subject: 2-Procedural Question    Hi Dr. [name],  I saw Brittany [name] today for a breast exam. Seems everything is normal. She ordered an ultrasound but said it wasn't needed.    My mother was tested for the BRCA gene but doesn't have it. She does have stage 4 breast cancer that recurred late in life, with the original diagnosis in her early 25s. The [facility] said I don't need to see them in order to qualify to have insurance cover an annual breast MRI. My breast cancer risk based on family history is 20-25%. Please let me know if you could authorize a breast MRI for this fall, and I understand this is in addition to my annual mammogram in the spring. Thank you. Brittany [name]

## 2017-02-06 NOTE — Progress Notes (Signed)
GYN Problem Visit    Brittany [name] is a 51 year old [place] female who presents c/o right axillary pain with possible mass.   Awoke from sleep 3 nights ago by sharp pain in the right axilla. Resolved without intervention. No pain since that time. Was on her period (takes OCP)  Has some axillary breast tissue- always there but feels it might be bigger now.  Concerned d/t family hx of breast cancer. Mother with breast cancer early 7s with recurrence now (stage IV) and sister with DCIS in her 74s. She thinks her mom had genetic testing and was negative. She is enrolled in the Wisdom study and needs to submit her specimen for testing. Last screening mammogram 08/2016 BIRADS 1.   She has 5 sisters and a couple of them are having annual breast MRI for screening. She asks about this.     GAIL MODEL:     Patient Risk   3.4%   Average Risk   1.3%   Based on the information provided, the patient's estimated risk for developing invasive breast cancer over the next 5 years is 3.4%, presented in red since hers is higher than the average risk of 1.3% (presented in blue) for women of the same age and race/ethnicity in the general [place] Population.     Lifetime Risk of Developing Breast Cancer  Patient Risk   27.7%   Average Risk   11.2%      Past Medical History:   Diagnosis Date    Hypothyroidism     h/o hyperthyroidism s/p radioactive iodine    Infectious mononucleosis 08/2006    Insomnia     Menorrhagia        Past Surgical History:   Procedure Laterality Date    CHOLECYSTECTOMY, LAP  1995    left wrist tendon repair  1998    PB MYOMECTOMY 1-4 MYOMAS 250 GM/< VAGINAL APPR      PB REMOVE TONSILS/ADENOIDS,<12 Y/O  1975    wisdom teeth         Family History   Problem Relation Age of Onset    Breast Cancer Mother 61    Other Sister      Uterine fibroids/hysterectomy    Crohn's Disease Sister     Breast Cancer Sister 8    Ovarian Cancer Neg Hx      but sister w/ovarian cysts age 18       Social History:  History      Smoking Status    Never Smoker   Smokeless Tobacco    Never Used       History   Alcohol Use    Yes     Comment: 1 bottle wine /week         Allergies:  Vicodin [hydrocodone-acetaminophen]    Current medications:  See EMR      PHYSICAL EXAMINATION:  BP 115/76 (BP Location: Left arm, BP Patient Position: Sitting, BP cuff size: Regular)   Pulse 77   Temp 98.2 F (36.8 C) (Oral)   Ht 5\' 9"  (1.753 m)   Wt 82.1 kg (181 lb)   LMP 02/01/2017   BMI 26.73 kg/m2   General Appearance: well-appearing, alert, cooperative; NAD  Breast: no lymphadenopathy, skin changes, masses or discharge.   RIGHT AXILLA: There is a 3 cm pocket of axillary breast tissue vs. Adipose tissue noted. No LAD palpable. Non-tender.   LEFT AXILLA: no masses.       Assessment and plan:  1. Mastodynia of right breast  Likely normal axillary breast tissue. No discrete masses noted. In light of family history, will refer for diagnostic mammogram and right axillary ultrasound.     - US Breast Limited - Right; Future  - Digital Diagnostic Mammogram Right Breast; Future    2. Family history of breast cancer  Early onset breast cancer in 2 first degree relatives.   Hosmer 5 year risk 3.4%. lifetime 27%. May benefit from annual screening breast MRI in addition to annual screening mammogram.  Recommend formal pedigree analysis and genetic counseling at [facility]    Orders Placed This Encounter   Procedures    US Breast Limited - Right    Digital Diagnostic Mammogram Right Breast    [facility]        Hedwig Morton, MSN,  ANP-C, [facility]

## 2017-02-12 ENCOUNTER — Encounter (INDEPENDENT_AMBULATORY_CARE_PROVIDER_SITE_OTHER): Payer: Self-pay | Admitting: Obstetrics & Gynecology

## 2017-02-12 NOTE — Telephone Encounter (Signed)
From: [name]  To: Lenna Sciara, MD  Sent: 02/12/2017 9:31 AM PDT  Subject: RE: 2-Procedural Question    Thank you.  Dyann Ruddle    ----- Message -----  From: Lenna Sciara, MD  Sent: 02/11/17, 11:28 PM  To: Brittany [name]  Subject: RE: 2-Procedural Question    Tora Perches,  I have placed an order for the breast MRI for you. Please call radiology to schedule this.  Hope all is well.  Dr [name]    ----- Message -----   From: Brittany [name]   Sent: 02/06/2017 12:40 PM PDT   To: Lenna Sciara, MD  Subject: 2-Procedural Question    Hi Dr. [name],  I saw Margie Billet today for a breast exam. Seems everything is normal. She ordered an ultrasound but said it wasn't needed.    My mother was tested for the BRCA gene but doesn't have it. She does have stage 4 breast cancer that recurred late in life, with the original diagnosis in her early 42s. The [facility] said I don't need to see them in order to qualify to have insurance cover an annual breast MRI. My breast cancer risk based on family history is 20-25%. Please let me know if you could authorize a breast MRI for this fall, and I understand this is in addition to my annual mammogram in the spring. Thank you. Brittany [name]

## 2017-02-17 ENCOUNTER — Ambulatory Visit (INDEPENDENT_AMBULATORY_CARE_PROVIDER_SITE_OTHER): Payer: BLUE CROSS/BLUE SHIELD | Admitting: Nurse Practitioner

## 2017-02-17 DIAGNOSIS — H6123 Impacted cerumen, bilateral: Principal | ICD-10-CM

## 2017-02-17 DIAGNOSIS — M898X0 Other specified disorders of bone, multiple sites: Secondary | ICD-10-CM

## 2017-02-17 NOTE — Progress Notes (Signed)
SUBJECTIVE:   Brittany [name] is a 51 year old female. She is here today for ear cleaning. Patient denies  pain and drainage. In the past the patient has required ear cleaning. Patient's surgical history on their ears is negative. Family history for ear disorders: negative.  Surfs intermittently      OBJECTIVE:  Physical exam revealed bilateral cerumen impaction which was cleaned under otomicroscope. Left ear intact; right ear intact.  Exostoses 30% right    ASSESSMENT:  Encounter Diagnoses   Name Primary?    Excessive cerumen in both ear canals Yes    Exostoses, multiple        PLAN:   Follow up: 2 years/prn    Kai Levins, NP

## 2017-03-07 ENCOUNTER — Encounter (INDEPENDENT_AMBULATORY_CARE_PROVIDER_SITE_OTHER): Payer: Self-pay | Admitting: Internal Medicine

## 2017-03-07 NOTE — Telephone Encounter (Signed)
From: [name]  To: [name], MD  Sent: 03/07/2017 2:55 PM PDT  Subject: 20-Other    Flu shot received today at a [place] [facility] at [address]

## 2017-03-07 NOTE — Telephone Encounter (Signed)
Records updated. FYI.

## 2017-03-10 ENCOUNTER — Ambulatory Visit
Admission: RE | Admit: 2017-03-10 | Discharge: 2017-03-10 | Disposition: A | Discharge status: Home / Self Care | Payer: BLUE CROSS/BLUE SHIELD | Attending: Diagnostic Radiology | Admitting: Diagnostic Radiology

## 2017-03-10 DIAGNOSIS — Z1239 Encounter for other screening for malignant neoplasm of breast: Secondary | ICD-10-CM | POA: Insufficient documentation

## 2017-03-10 DIAGNOSIS — Z803 Family history of malignant neoplasm of breast: Secondary | ICD-10-CM | POA: Insufficient documentation

## 2017-03-10 MED ORDER — GADOBUTROL 1 MMOL/ML IV SOLN
7.30 mL | Freq: Once | INTRAVENOUS | Status: AC
Start: 2017-03-10 — End: 2017-03-10
  Administered 2017-03-10: 7.3 mL via INTRAVENOUS

## 2017-03-27 ENCOUNTER — Emergency Department (INDEPENDENT_AMBULATORY_CARE_PROVIDER_SITE_OTHER): Payer: BLUE CROSS/BLUE SHIELD | Admitting: Emergency Medicine

## 2017-03-27 ENCOUNTER — Encounter (INDEPENDENT_AMBULATORY_CARE_PROVIDER_SITE_OTHER): Payer: Self-pay

## 2017-03-27 VITALS — BP 115/60 | HR 77 | Temp 98.6°F | Resp 16

## 2017-03-27 DIAGNOSIS — E860 Dehydration: Principal | ICD-10-CM

## 2017-03-27 DIAGNOSIS — R112 Nausea with vomiting, unspecified: Secondary | ICD-10-CM

## 2017-03-27 MED ORDER — SODIUM CHLORIDE 0.9 % IV BOLUS
1000.0000 mL | INJECTION | Freq: Once | INTRAVENOUS | Status: AC
Start: 2017-03-27 — End: 2017-03-27
  Administered 2017-03-27: 1000 mL via INTRAVENOUS

## 2017-03-27 MED ORDER — ONDANSETRON HCL 4 MG/2ML IV SOLN
4.0000 mg | Freq: Once | INTRAMUSCULAR | Status: AC
Start: 2017-03-27 — End: 2017-03-27
  Administered 2017-03-27: 4 mg via INTRAVENOUS

## 2017-03-27 NOTE — Interdisciplinary (Signed)
Name/DOB verified.  Pt roomed, VS, and intake done.   Informed on plan of care and aware of delay; pt verbalized understanding.   Ambulatory to clinic: yes  Comfort measures offered: yes   Vitals stable: yes  Pain addressed: yes

## 2017-03-27 NOTE — Interdisciplinary (Signed)
Dr. Bayard Hugger at bs.

## 2017-03-27 NOTE — Interdisciplinary (Signed)
1037hrs 2pt ID/Allergies Verified   Pt educated on procedure for IV placement; verbalized understanding.  Left  antecubital prepped.  18 x 1 gauge IV placed to LEFT AC x 1 attempt. IV flushed with 44mls NS without difficulty. Pt denies any pain; no redness or swelling noted to IV site. IV secured with tegaderm. Pt tolerated well. IV Infusion Start. 1L of NaCL infusing bolus.

## 2017-03-27 NOTE — Patient Instructions (Signed)
Vomiting    You have been seen for vomiting.    Vomiting (throwing-up) can be caused by many different things. Most of the time the cause IS NOT serious. The doctor feels it is OK for you to go home today.    Common causes of vomiting include the following:   Gastroenteritis (stomach flu), usually with diarrhea.   Other illnesses. Sometimes medical conditions like diabetes, heart problems, headaches, or infections can make someone throw up.    Bowel obstructions (blockages) can cause vomiting and make patients unable to have bowel movements (stool) or pass gas.   Vomiting can be a symptom of appendicitis, especially if there is also pain in the right lower abdomen (belly).    Sometimes it is hard to find out what is causing the vomiting. Vomiting can be treated with anti-nausea medicines like promethazine (Phenergan), prochlorperazine (Compazine) or ondansetron (Zofran).    Try to drink liquids to avoid dehydration. Don't drink a lot of fluid all at once. Take small sips throughout the day.    YOU SHOULD SEEK MEDICAL ATTENTION IMMEDIATELY, EITHER HERE OR AT THE NEAREST EMERGENCY DEPARTMENT, IF ANY OF THE FOLLOWING OCCURS:   You can't stop vomiting or your vomiting doesn't get better with medication.   You cannot keep liquids down.   You have severe sudden chest or belly pain after vomiting.   You have abdominal pain.

## 2017-03-27 NOTE — Interdisciplinary (Signed)
Two pt ids/allergies verified. VS retake, VSS. #20G discontinued with catheter tip intact. Pressure dressing applied. AVS provided, pt ambulatory to exit in NAD.

## 2017-03-27 NOTE — Progress Notes (Signed)
Chief Complaint:   Chief Complaint   Patient presents with    Other     "food poisoning since yesterday"        Case summary is as follows: [name] is a 51 year old female  who presents with nausea and vomiting since last night, states she ate some really bad salmon, also presenting with some nonbloody diarrhea x4, denies any abdominal pain or rash denies any fevers or headache or stiff neck.  Has been tolerating some sips of water today but and unable to the keep down solid food.    Past Medical History:   Diagnosis Date    Hypothyroidism     h/o hyperthyroidism s/p radioactive iodine    Infectious mononucleosis 08/2006    Insomnia     Menorrhagia      Past Surgical History:   Procedure Laterality Date    CHOLECYSTECTOMY, LAP  1995    left wrist tendon repair  1998    PB MYOMECTOMY 1-4 MYOMAS 250 GM/< VAGINAL APPR      PB REMOVE TONSILS/ADENOIDS,<12 Y/O  1975    wisdom teeth       Social History     Social History    Marital status: Married     Spouse name: N/A    Number of children: N/A    Years of education: N/A     Occupational History    Office manager      Social History Main Topics    Smoking status: Never Smoker    Smokeless tobacco: Never Used    Alcohol use Yes      Comment: 1 bottle wine /week    Drug use: No    Sexual activity: Not Currently     Other Topics Concern    Not on file     Social History Narrative     Family History   Problem Relation Age of Onset    Breast Cancer Mother 93    Other Sister      Uterine fibroids/hysterectomy    Crohn's Disease Sister     Breast Cancer Sister 51    Ovarian Cancer Neg Hx      but sister w/ovarian cysts age 43     Medication Review Moment  Cannot display prior to admission medications because the patient has not been admitted in this contact.       Review of Systems -ROS: Except as documented above, all other systems were reviewed and were negative    Physical Examination:    03/27/17  1007 03/27/17  1129   BP: 110/57 115/60    Pulse: 98 77   Resp: 16    Temp: 98.6 F (37 C)    SpO2: 99%      Vital Signs noted from Triage Page.  Brittany [name] is a well developed well nourished female in no apparent distress. she is alert and oriented *3.   HEENT exam shows:  Ears TM clear bilaterally.   Eyes PERRL EOMI.   Mouth mildly dry mucous membranes  Neck exam is non-tender. Supple. No lymphadenopathy.  Chest exam -No retractions. clear to auscultation bilaterally.   Cardiac exam shows regular rate and rhythm.   Abdominal exam shows that abdomen is soft and non-tender.   Back exam has no costovertebral angle tenderness.  Extremity exam shows no cyanosis, clubbing or edema  Skin exam shows no lesions or rashes.  No petechiae    Impression:   51 year old female with  mild dehydration due to gastroenteritis for 24 hours, physical exam shows benign abdomen, will treat with IV fluids and IV Zofran, after IV hydration patient states she feels much better  Return precautions given  Patient to arrange follow up with PMD  Patient comfortable with plan

## 2017-03-27 NOTE — Interdisciplinary (Signed)
Two pt ids/allergies verified. Zofran given IVP to LAC x 2 min. Site flushed with 31ml NS. Pt denies pain at this time. CMs offered, pt given additional warm blanket for comfort.

## 2017-04-04 ENCOUNTER — Ambulatory Visit (HOSPITAL_BASED_OUTPATIENT_CLINIC_OR_DEPARTMENT_OTHER): Payer: BLUE CROSS/BLUE SHIELD | Admitting: MS"

## 2017-06-25 ENCOUNTER — Other Ambulatory Visit (INDEPENDENT_AMBULATORY_CARE_PROVIDER_SITE_OTHER): Payer: Self-pay | Admitting: Internal Medicine

## 2017-06-25 DIAGNOSIS — M545 Low back pain, unspecified: Secondary | ICD-10-CM

## 2017-06-25 MED ORDER — CYCLOBENZAPRINE HCL 5 MG OR TABS
5.0000 mg | ORAL_TABLET | Freq: Three times a day (TID) | ORAL | 1 refills | Status: DC | PRN
Start: 2017-06-25 — End: 2017-12-02

## 2017-06-25 NOTE — Telephone Encounter (Signed)
Patient is requesting refill of medication, please see orders for preloaded prescription refill.    Requested Prescriptions     Pending Prescriptions Disp Refills    cyclobenzaprine (FLEXERIL) 5 MG tablet 30 tablet 1     Sig: Take 1 tablet (5 mg) by mouth 3 times daily as needed for Muscle Spasms.       LOV: 09/12/2016  NOV: Visit date not found  Last RX fill: 10/17/2015

## 2017-07-30 ENCOUNTER — Other Ambulatory Visit (INDEPENDENT_AMBULATORY_CARE_PROVIDER_SITE_OTHER): Payer: Self-pay | Admitting: Obstetrics & Gynecology

## 2017-07-30 DIAGNOSIS — N920 Excessive and frequent menstruation with regular cycle: Principal | ICD-10-CM

## 2017-08-08 ENCOUNTER — Encounter (INDEPENDENT_AMBULATORY_CARE_PROVIDER_SITE_OTHER): Payer: Self-pay | Admitting: Obstetrics & Gynecology

## 2017-08-08 ENCOUNTER — Other Ambulatory Visit (INDEPENDENT_AMBULATORY_CARE_PROVIDER_SITE_OTHER): Payer: Self-pay | Admitting: Obstetrics & Gynecology

## 2017-08-08 DIAGNOSIS — N920 Excessive and frequent menstruation with regular cycle: Secondary | ICD-10-CM

## 2017-08-08 MED ORDER — LEVONORGESTREL-ETHINYL ESTRAD 0.1-20 MG-MCG OR TABS
1.0000 | ORAL_TABLET | Freq: Every day | ORAL | 3 refills | Status: DC
Start: 2017-08-08 — End: 2018-04-09

## 2017-08-08 NOTE — Telephone Encounter (Signed)
Patient is requesting med refill    Last seen:02/06/2017  No future appts    Please review.

## 2017-08-08 NOTE — Telephone Encounter (Signed)
From: [name]  To: Hinda Kehr, MD  Sent: 08/08/2017 9:47 AM PDT  Subject: 20-Other    Hi Dr. [name],  Please renew my prescription for Aviane. I sent a request through [facility] on March 13 and the [facility] on [address]. sent a fax on March 21.  Thank you!  Brittany [name]

## 2017-08-08 NOTE — Telephone Encounter (Signed)
Dr. [name],    Pt is requesting a refill for Aviane. Pt was last seen 12/14/2015. Please review

## 2017-08-08 NOTE — Telephone Encounter (Signed)
Dr Deak please sign prescription.

## 2017-08-18 ENCOUNTER — Ambulatory Visit (INDEPENDENT_AMBULATORY_CARE_PROVIDER_SITE_OTHER): Payer: BLUE CROSS/BLUE SHIELD | Admitting: Internal Medicine

## 2017-08-18 ENCOUNTER — Encounter (INDEPENDENT_AMBULATORY_CARE_PROVIDER_SITE_OTHER): Payer: Self-pay | Admitting: Internal Medicine

## 2017-08-18 VITALS — BP 112/78 | HR 86 | Temp 98.4°F | Resp 16 | Wt 173.0 lb

## 2017-08-18 DIAGNOSIS — Z1331 Encounter for screening for depression: Secondary | ICD-10-CM

## 2017-08-18 DIAGNOSIS — M1712 Unilateral primary osteoarthritis, left knee: Secondary | ICD-10-CM

## 2017-08-18 DIAGNOSIS — E785 Hyperlipidemia, unspecified: Principal | ICD-10-CM

## 2017-08-18 DIAGNOSIS — Z23 Encounter for immunization: Secondary | ICD-10-CM

## 2017-08-18 DIAGNOSIS — Z1389 Encounter for screening for other disorder: Secondary | ICD-10-CM

## 2017-08-18 DIAGNOSIS — E039 Hypothyroidism, unspecified: Secondary | ICD-10-CM

## 2017-08-18 DIAGNOSIS — Z1339 Encounter for screening examination for other mental health and behavioral disorders: Secondary | ICD-10-CM

## 2017-08-18 DIAGNOSIS — J301 Allergic rhinitis due to pollen: Secondary | ICD-10-CM

## 2017-08-18 NOTE — Interdisciplinary (Signed)
TDaP  vaccine given to patient without complication.  VIS and AVS instructions given to patient  post clinic visit with good understanding.

## 2017-08-18 NOTE — Progress Notes (Signed)
The patient returns for follow-up of her  hypothyroidism and hyperlipidemia.  She denies any chest pain, shortness of breath, palpitations, or symptoms of hyper or hypothyroidism.  She states that her allergies are in control with over-the-counter antihistamines.  She does occasional pain in the left knee from osteoarthritis, but is controlling it with knee brace and Aleve as needed it she feels she needs no other treatment.      Past Medical History:   Diagnosis Date    Hypothyroidism     h/o hyperthyroidism s/p radioactive iodine    Infectious mononucleosis 08/2006    Insomnia     Menorrhagia        Current Outpatient Medications   Medication Sig    cetirizine (ZYRTEC) 10 MG tablet Take 10 mg by mouth daily.    clotrimazole (LOTRIMIN) 1 % cream Apply 1 Application topically daily. Apply to affected area daily.    cyclobenzaprine (FLEXERIL) 5 MG tablet Take 1 tablet (5 mg) by mouth 3 times daily as needed for Muscle Spasms.    diclofenac (VOLTAREN) 1 % gel Apply 2 g topically 4 times daily.    fluticasone propionate (FLONASE) 50 MCG/ACT nasal spray Spray 1 spray into each nostril 2 times daily.    ibuprofen (MOTRIN) 800 MG tablet Take 1 tablet (800 mg) by mouth every 8 hours as needed for Mild Pain (Pain Score 1-3) or Moderate Pain (Pain Score 4-6).    levonorgestrel-ethinyl estradiol (AVIANE) 0.1-20 MG-MCG tablet Take 1 tablet by mouth daily.    levothyroxine (SYNTHROID) 112 MCG tablet Take 1 tablet (112 mcg) by mouth every other day. Alternating with 125 mcg tablet every other day    levothyroxine (SYNTHROID) 125 MCG tablet Take 1 tablet (125 mcg) by mouth every other day. Alternating with 112 mcg tablet every other day.    metroNIDAZOLE (METROGEL) 1 % gel Apply 1 Application topically daily. Apply to affected area daily.    tretinoin (RETIN-A) 0.025 % cream Apply a thin layer at bedtime as directed    valACYclovir (VALTREX) 1 GM tablet Take 1 tablet (1,000 mg) by mouth 2 times daily.     No  current facility-administered medications for this visit.        Allergies   Allergen Reactions    Vicodin [Hydrocodone-Acetaminophen] Hallucinations       ROS--    No symptoms of hypo or hyperthyroidism: no decreased or increased weight, no feeling cold/chilly or excessively warm, no diarrhea or constipation, no undue sweatiness, anxiety or palpitations.    The patient denies cough, chest pain, dyspnea, wheezing or hemoptysis.    Patient denies any exertional chest pain, dyspnea, palpitations, syncope, orthopnea, edema or paroxysmal nocturnal dyspnea.    The patient denies abdominal or flank pain, anorexia, nausea or vomiting, dysphagia, change in bowel habits or black or bloody stools or weight loss.    The patient denies swelling, numbness, tingling or weakness in the extremities.    PE--    She appears well, in no apparent distress.  Alert and oriented times three, pleasant and cooperative. Vital signs are as noted by the nurse.    Vitals:    08/18/17 0739   BP: 112/78   BP Location: Right arm   BP Patient Position: Sitting   BP cuff size: Large   Pulse: 86   Resp: 16   Temp: 98.4 F (36.9 C)   TempSrc: Oral   SpO2: 99%   Weight: 78.5 kg (173 lb)  Thyroid not palpable, not enlarged, no nodules detected.    Chest is clear, no wheezing or rales. Normal symmetric air entry throughout both lung fields. No chest wall deformities or tenderness.    S1 and S2 normal, no murmurs, clicks, gallops or rubs. Regular rate and rhythm. Chest is clear; no wheezes or rales. No edema or JVD.    The abdomen is soft without tenderness, guarding, mass, rebound or organomegaly. Bowel sounds are normal. No CVA tenderness or inguinal adenopathy noted.    Extremities: extremities, peripheral pulses and reflexes normal, no edema, redness or tenderness in the calves or thighs, feet normal, good pulses, normal color, temperature and sensation.    A/P--      ICD-10-CM ICD-9-CM    1. Hyperlipidemia, unspecified hyperlipidemia type--she  will fasting lipids. E78.5 272.4    2. Acquired hypothyroidism--continue current regimen alternating levothyroxine dosages with TSH to be drawn. E03.9 244.9    3. Seasonal allergic rhinitis due to pollen--she can continue over-the-counter antihistamines. J30.1 477.0    4. Primary osteoarthritis of left knee--will follow. M17.12 715.16    5. Need for vaccination Z23 V05.9 Tdap (BOOSTRIX/ADACEL) for patients 71-66 years old   62. Screened negative for significant depression (PHQ9 of 2-4) Z13.31 V79.0    7. Screened negative for alcohol use Z13.39 V79.1    8. Screened negative for drug use Z13.89 V82.9

## 2017-08-19 ENCOUNTER — Encounter (INDEPENDENT_AMBULATORY_CARE_PROVIDER_SITE_OTHER): Payer: Self-pay | Admitting: Internal Medicine

## 2017-08-19 ENCOUNTER — Other Ambulatory Visit (INDEPENDENT_AMBULATORY_CARE_PROVIDER_SITE_OTHER): Payer: Self-pay | Admitting: Internal Medicine

## 2017-08-19 DIAGNOSIS — E039 Hypothyroidism, unspecified: Principal | ICD-10-CM

## 2017-08-20 ENCOUNTER — Other Ambulatory Visit (INDEPENDENT_AMBULATORY_CARE_PROVIDER_SITE_OTHER): Payer: BLUE CROSS/BLUE SHIELD | Attending: Internal Medicine

## 2017-08-20 ENCOUNTER — Other Ambulatory Visit: Payer: Self-pay

## 2017-08-20 DIAGNOSIS — E785 Hyperlipidemia, unspecified: Secondary | ICD-10-CM

## 2017-08-20 DIAGNOSIS — E039 Hypothyroidism, unspecified: Secondary | ICD-10-CM

## 2017-08-20 LAB — COMPREHENSIVE METABOLIC PANEL, BLOOD
ALT (SGPT): 24 U/L (ref 0–33)
AST (SGOT): 23 U/L (ref 0–32)
Albumin: 4.3 g/dL (ref 3.5–5.2)
Alkaline Phos: 41 U/L (ref 35–140)
Anion Gap: 11 mmol/L (ref 7–15)
BUN: 13 mg/dL (ref 6–20)
Bicarbonate: 26 mmol/L (ref 22–29)
Bilirubin, Tot: 2.01 mg/dL — ABNORMAL HIGH (ref <1.2)
Calcium: 9.6 mg/dL (ref 8.5–10.6)
Chloride: 104 mmol/L (ref 98–107)
Creatinine: 0.82 mg/dL (ref 0.51–0.95)
GFR: >60 mL/min
Glucose: 94 mg/dL (ref 70–99)
Potassium: 4.2 mmol/L (ref 3.5–5.1)
Sodium: 141 mmol/L (ref 136–145)
Total Protein: 6.6 g/dL (ref 6.0–8.0)

## 2017-08-20 LAB — TSH, BLOOD: TSH: 1.12 u[IU]/mL (ref 0.27–4.20)

## 2017-08-20 LAB — LIPID(CHOL FRACT) PANEL, BLOOD
Cholesterol: 253 mg/dL — ABNORMAL HIGH (ref <200)
HDL-Cholesterol: 56 mg/dL
LDL-Chol (Calc): 176 mg/dL — ABNORMAL HIGH (ref <160)
Non-HDL Cholesterol: 197 mg/dL
Triglycerides: 103 mg/dL (ref 10–170)

## 2017-08-20 MED ORDER — LEVOTHYROXINE SODIUM 112 MCG OR TABS
112.0000 ug | ORAL_TABLET | ORAL | 1 refills | Status: DC
Start: 2017-08-20 — End: 2017-11-07

## 2017-08-20 MED ORDER — LEVOTHYROXINE SODIUM 125 MCG OR TABS
125.0000 ug | ORAL_TABLET | ORAL | 1 refills | Status: DC
Start: 2017-08-20 — End: 2017-11-07

## 2017-08-20 NOTE — Telephone Encounter (Signed)
Patient is requesting refill of medication, please see orders for preloaded prescription refill.    Requested Prescriptions     Pending Prescriptions Disp Refills    levothyroxine (SYNTHROID) 112 MCG tablet 45 tablet 1     Sig: Take 1 tablet (112 mcg) by mouth every other day. Alternating with 125 mcg tablet every other day    levothyroxine (SYNTHROID) 125 MCG tablet 45 tablet 1     Sig: Take 1 tablet (125 mcg) by mouth every other day. Alternating with 112 mcg tablet every other day.       LOV:   08/18/2017  NOV:   08/21/2017  Last RX fill:  01/22/2017    Last Thyroid Labs:  Lab Results   Component Value Date    TSH 1.12 08/20/2017    FREET4 1.82 (H) 11/25/2014

## 2017-08-20 NOTE — Telephone Encounter (Addendum)
Message sent. Appt made to be seen tomorrow at 7:40 AM. Patient  confirmed  appt with good understanding of POC.  FYI.

## 2017-08-20 NOTE — Interdisciplinary (Signed)
Blood drawn from left arm with 23 gauge needle. 1 tubes taken.   Patient identity authenticated by Laurence J Orgaya.

## 2017-08-20 NOTE — Telephone Encounter (Signed)
From: [name]  To: [name], MD  Sent: 08/19/2017 10:43 PM PDT  Subject: 20-Other    Hi [name],  Just sent you an email. I think I have pinworms. Have been experiencing symptoms for about 1 week, worse at night. I am the only person in my household. Is it possible to get pinworms from cleaning my cat's litter box? I always wash my hands thoroughly.  Could you prescribe medication?  Thank you,  Brittany [name]

## 2017-08-20 NOTE — Telephone Encounter (Signed)
Medication had been filled.

## 2017-08-21 ENCOUNTER — Encounter (INDEPENDENT_AMBULATORY_CARE_PROVIDER_SITE_OTHER): Payer: Self-pay | Admitting: Internal Medicine

## 2017-08-21 ENCOUNTER — Ambulatory Visit (INDEPENDENT_AMBULATORY_CARE_PROVIDER_SITE_OTHER): Payer: BLUE CROSS/BLUE SHIELD | Admitting: Internal Medicine

## 2017-08-21 VITALS — BP 110/60 | HR 82 | Temp 98.5°F | Resp 16 | Wt 175.0 lb

## 2017-08-21 DIAGNOSIS — B80 Enterobiasis: Principal | ICD-10-CM

## 2017-08-21 MED ORDER — MEBENDAZOLE 100 MG OR CHEW
CHEWABLE_TABLET | ORAL | 0 refills | Status: DC
Start: 2017-08-21 — End: 2017-09-17

## 2017-08-21 NOTE — Interdisciplinary (Signed)
AVS instructions given to patient  post clinic visit with good understanding.

## 2017-08-21 NOTE — Progress Notes (Signed)
Patient returns for an acute visit complaining that she may have pinworms.  She states for the past week she has been getting in the pruritus mostly at night.  She has not seen any worms has not had any diarrhea rash or any other associated symptoms.  She washed her sheets in bed clothes in water which but then the symptoms returned.    Past Medical History:   Diagnosis Date    Hypothyroidism     h/o hyperthyroidism s/p radioactive iodine    Infectious mononucleosis 08/2006    Insomnia     Menorrhagia        Current Outpatient Medications   Medication Sig    cetirizine (ZYRTEC) 10 MG tablet Take 10 mg by mouth daily.    clotrimazole (LOTRIMIN) 1 % cream Apply 1 Application topically daily. Apply to affected area daily.    cyclobenzaprine (FLEXERIL) 5 MG tablet Take 1 tablet (5 mg) by mouth 3 times daily as needed for Muscle Spasms.    diclofenac (VOLTAREN) 1 % gel Apply 2 g topically 4 times daily.    fluticasone propionate (FLONASE) 50 MCG/ACT nasal spray Spray 1 spray into each nostril 2 times daily.    ibuprofen (MOTRIN) 800 MG tablet Take 1 tablet (800 mg) by mouth every 8 hours as needed for Mild Pain (Pain Score 1-3) or Moderate Pain (Pain Score 4-6).    levonorgestrel-ethinyl estradiol (AVIANE) 0.1-20 MG-MCG tablet Take 1 tablet by mouth daily.    levothyroxine (SYNTHROID) 112 MCG tablet Take 1 tablet (112 mcg) by mouth every other day. Alternating with 125 mcg tablet every other day    levothyroxine (SYNTHROID) 125 MCG tablet Take 1 tablet (125 mcg) by mouth every other day. Alternating with 112 mcg tablet every other day.    metroNIDAZOLE (METROGEL) 1 % gel Apply 1 Application topically daily. Apply to affected area daily.    tretinoin (RETIN-A) 0.025 % cream Apply a thin layer at bedtime as directed    valACYclovir (VALTREX) 1 GM tablet Take 1 tablet (1,000 mg) by mouth 2 times daily.     No current facility-administered medications for this visit.        Allergies   Allergen Reactions     Vicodin [Hydrocodone-Acetaminophen] Hallucinations       ROS--      The patient denies abdominal or flank pain, anorexia, nausea or vomiting, dysphagia, change in bowel habits or black or bloody stools or weight loss.      PE--    She appears well, in no apparent distress.  Alert and oriented times three, pleasant and cooperative. Vital signs are as noted by the nurse.    Vitals:    08/21/17 0742   BP: 110/60   BP Location: Right arm   BP Patient Position: Sitting   BP cuff size: Large   Pulse: 82   Resp: 16   Temp: 98.5 F (36.9 C)   TempSrc: Oral   SpO2: 99%   Weight: 79.4 kg (175 lb)       The abdomen is soft without tenderness, guarding, mass, rebound or organomegaly. Bowel sounds are normal. No CVA tenderness or inguinal adenopathy noted.    Rectum normal without rashes or other abnormalities    A/P--      ICD-10-CM ICD-9-CM    1. Pinworms--will treat empirically for pinworms.  If her symptoms persist, she will then be referred to Dermatology and/or GI. B80 127.4 mebendazole (VERMOX) 100 MG chewable tablet

## 2017-08-30 ENCOUNTER — Encounter (INDEPENDENT_AMBULATORY_CARE_PROVIDER_SITE_OTHER): Payer: Self-pay | Admitting: Internal Medicine

## 2017-09-01 NOTE — Telephone Encounter (Signed)
Would rather not complicate matters until the pinworm treatment is completed.  If itching persists, it may be unrelated to the pinworms and will need to be evaluated by [facility].

## 2017-09-01 NOTE — Telephone Encounter (Signed)
From: [name]  To: [name], MD  Sent: 08/30/2017 6:37 AM PDT  Subject: 1-Non Urgent Medical Advice    Hello [name],  I took the first dose of pinworm med on 4/5 and will take the second dose on 4/19.   I see the pinworms in the morning in my underwear, which I wash daily in hot water along with pajamas, sheets and towels. I also shower every morning. I now have vaginal itching and am wondering if there's any other med I can take/use to alleviate that.    Thank you,  Brittany [name]

## 2017-09-01 NOTE — Telephone Encounter (Signed)
Will hold for Dr Sammuel Hines for advise/order. Thank you.

## 2017-09-03 NOTE — Telephone Encounter (Signed)
Message sent

## 2017-09-16 ENCOUNTER — Encounter (INDEPENDENT_AMBULATORY_CARE_PROVIDER_SITE_OTHER): Payer: Self-pay | Admitting: Internal Medicine

## 2017-09-16 DIAGNOSIS — B80 Enterobiasis: Principal | ICD-10-CM

## 2017-09-17 ENCOUNTER — Encounter (INDEPENDENT_AMBULATORY_CARE_PROVIDER_SITE_OTHER): Payer: Self-pay | Admitting: Internal Medicine

## 2017-09-17 ENCOUNTER — Other Ambulatory Visit: Payer: Self-pay

## 2017-09-17 DIAGNOSIS — B80 Enterobiasis: Secondary | ICD-10-CM

## 2017-09-17 MED ORDER — MEBENDAZOLE 100 MG OR CHEW
CHEWABLE_TABLET | ORAL | 0 refills | Status: DC
Start: 2017-09-17 — End: 2017-09-18
  Filled 2017-09-17: qty 2, 1d supply, fill #0

## 2017-09-17 NOTE — Telephone Encounter (Signed)
Unusual that pinworms would not resolve with first course of treatment. Also not a really common cause of anal pruritus in adults.    I have sent the refill, but I suspect her pruritus may have other causes. Please advise we need to see her back in clinic if not better after this second course.

## 2017-09-17 NOTE — Telephone Encounter (Signed)
From: [name]  To: [name], MD  Sent: 09/16/2017 8:41 PM PDT  Subject: 1-Non Urgent Medical Advice    Hello [name],  The pinworms still haven't gone away. May I have another dose of the medication? I took 2, 2 weeks apart, and have been diligent about cleaning.  Brittany [name]

## 2017-09-17 NOTE — Telephone Encounter (Signed)
Forwarded to Dr Joneen Roach for advise/order. Thank you.

## 2017-09-18 ENCOUNTER — Telehealth (INDEPENDENT_AMBULATORY_CARE_PROVIDER_SITE_OTHER): Payer: Self-pay | Admitting: Internal Medicine

## 2017-09-18 DIAGNOSIS — B80 Enterobiasis: Secondary | ICD-10-CM

## 2017-09-18 MED ORDER — MEBENDAZOLE 100 MG OR CHEW
CHEWABLE_TABLET | ORAL | 0 refills | Status: DC
Start: 2017-09-18 — End: 2017-11-24

## 2017-09-18 NOTE — Telephone Encounter (Signed)
From: [name]  To: [name], MD  Sent: 09/17/2017 5:48 PM PDT  Subject: 1-Non Urgent Medical Advice    Velda Shell,  The med was sent to [facility] by mistake. That pharmacy called me and said they can't fill the prescription. Please send to [facility], which is my Pharmacy. Thank you!  Dyann Ruddle    ----- Message -----  From: Harvin Hazel, [place]  Sent: 09/03/17, 12:26 PM  To: [name]  Subject: RE: 1-Non Urgent Medical Advice    Per Dr Sammuel Hines;  "Would rather not complicate matters until the pinworm treatment is completed. If itching persists, it may be unrelated to the pinworms and will need to be evaluated by [place]."    Regards,  Kathlee Nations      ----- Message -----   From: [name]   Sent: 08/30/2017 6:37 AM PDT   To: [name], MD  Subject: 1-Non Urgent Medical Advice    Hello [name],  I took the first dose of pinworm med on 4/5 and will take the second dose on 4/19.   I see the pinworms in the morning in my underwear, which I wash daily in hot water along with pajamas, sheets and towels. I also shower every morning. I now have vaginal itching and am wondering if there's any other med I can take/use to alleviate that.    Thank you,  [name]

## 2017-09-18 NOTE — Telephone Encounter (Signed)
Addressed in duplicate encounter

## 2017-09-18 NOTE — Telephone Encounter (Signed)
Message has been addressed.

## 2017-09-18 NOTE — Telephone Encounter (Signed)
-----   Message from Lorelee Cover, [place] sent at 09/17/2017  5:49 PM PDT -----  Regarding: Re; mebendazole prescription  Hi Dr Joneen Roach,                      Patient wants this prescription resent to [facility] on [address]. We do not have the medication and are not able to fill this prescription since [place] pharmacies are under 340-b medication purchase contract. I hope this makes sense.    -Daleen Snook, PharmD  [phone]  [place], [place]

## 2017-09-18 NOTE — Telephone Encounter (Signed)
From: Brittany [name]  To: [name], MD  Sent: 09/17/2017 4:20 PM PDT  Subject: 1-Non Urgent Medical Advice    Hi,  Hoping you saw my request from yesterday evening for more pinworm medication. The 2 doses didn't get rid of them. I have been diligent in all forms of cleaning and wash linens and clothes in hot water daily.  Thank you,  Brittany [name]  ----- Message -----  From: Harvin Hazel, [place]  Sent: 09/03/17, 12:26 PM  To: Brittany [name]  Subject: RE: 1-Non Urgent Medical Advice    Per Dr Sammuel Hines;  "Would rather not complicate matters until the pinworm treatment is completed. If itching persists, it may be unrelated to the pinworms and will need to be evaluated by [place]."    Regards,  Kathlee Nations      ----- Message -----   From: Brittany [name]   Sent: 08/30/2017 6:37 AM PDT   To: [name], MD  Subject: 1-Non Urgent Medical Advice    Hello [name],  I took the first dose of pinworm med on 4/5 and will take the second dose on 4/19.   I see the pinworms in the morning in my underwear, which I wash daily in hot water along with pajamas, sheets and towels. I also shower every morning. I now have vaginal itching and am wondering if there's any other med I can take/use to alleviate that.    Thank you,  [name]

## 2017-09-18 NOTE — Telephone Encounter (Signed)
Routed to MD to Sign Pended Medication as it went to [facility] instead of [facility] on [address].    Pharmacy correct.

## 2017-09-23 ENCOUNTER — Telehealth (INDEPENDENT_AMBULATORY_CARE_PROVIDER_SITE_OTHER): Payer: Self-pay | Admitting: Internal Medicine

## 2017-09-23 NOTE — Telephone Encounter (Signed)
Who is calling: Incoming call from patient  Insurance Coverage Verified: Active- in network  Reason for this call: Patient wants to know why her appointment with Dr. Laurance Flatten on 11/10/17 was canceled, states that she is a former pt of Dr Sammuel Hines and Dr. Laurance Flatten is supposed to be her new primary.    Action required by office: Please contact caller    Duplicate encounter? No previous documentation found on this issue.     Best way to contact: [phone]    Inquiry has been read verbatim to this caller. Verbalizes satisfaction and confirms the above is accurate: yes      Has been advised this message will be transmitted to office and can expect a response within the next 24-72 hours.

## 2017-09-23 NOTE — Telephone Encounter (Signed)
Forwarded to [place] to reschedule.  FYI. Appointment with Dr Laurance Flatten has been bumped.  Thank you.

## 2017-09-23 NOTE — Telephone Encounter (Signed)
Spoke to patient appointment has been re-scheduled to 11/12/17 with Dr. Laurance Flatten.  Matter solved

## 2017-10-07 ENCOUNTER — Encounter (INDEPENDENT_AMBULATORY_CARE_PROVIDER_SITE_OTHER): Payer: Self-pay | Admitting: Internal Medicine

## 2017-10-07 ENCOUNTER — Telehealth (INDEPENDENT_AMBULATORY_CARE_PROVIDER_SITE_OTHER): Payer: Self-pay

## 2017-10-07 DIAGNOSIS — N898 Other specified noninflammatory disorders of vagina: Secondary | ICD-10-CM

## 2017-10-07 DIAGNOSIS — B80 Enterobiasis: Secondary | ICD-10-CM

## 2017-10-07 NOTE — Telephone Encounter (Signed)
Spoke to pt,52 years old,has seen her PCP,was referred to [place] for vaginal pinworms.Has been on medication now for six weeks with no relief.I advised no soon appointments with Dr [name].Last seen 2017.Can call [facility] for sooner availability.I did discuss this with patient as pinworms are rectal in origin,she agreed.Her PCP also mentioned a referral to GI,as could be an intestinal parasite?

## 2017-10-07 NOTE — Telephone Encounter (Signed)
Dr Joneen Roach, please advise/order. Thank you.

## 2017-10-07 NOTE — Telephone Encounter (Signed)
From: Brittany [name]  To: [name], MD  Sent: 10/07/2017 9:20 AM PDT  Subject: 1-Non Urgent Medical Advice    Hello,  I took the last dose of the second course for pinworms on May 17 but am still seeing them in my underwear, during the day too not just at night. It has been more than 6 weeks since this started. Should I make an appointment with Dr. [name], my OB/GYN?  Thank you,  Brittany [name]    ----- Message -----  From: [name]  Sent: 09/18/2017 8:58 AM PDT  To: Brittany [name]  Subject: RE: 1-Non Urgent Medical Advice  Brittany [name],    The covering MD saw your message and per MD;   "Unusual that pinworms would not resolve with first course of treatment. Also not a really common cause of anal pruritus in adults.    I have sent the refill, but I suspect her pruritus may have other causes. Please advise we need to see her back in clinic if not better after this second course.    I did notify the MD that the Rx went to the wrong pharmacy.   I will let you know when it is sent to the [facility].    Thank you!  -[name]       ----- Message -----   From: Brittany [name]   Sent: 09/16/2017 8:41 PM PDT   To: [name], MD  Subject: 1-Non Urgent Medical Advice    Hello [name],  The pinworms still haven't gone away. May I have another dose of the medication? I took 2, 2 weeks apart, and have been diligent about cleaning.  Brittany [name]

## 2017-10-07 NOTE — Telephone Encounter (Signed)
Who is calling: Pt     What are the symptoms: Pinworms. Per pt she saw her PCP and was treated twice but medication didn't help. Per pt pinworms are now coming from the vagina and was asked by her pcp to come see [place].         When did they start: 6 weeks         What is the patients current insurance:[employer]     Does it require an authorization to be seen:      What is the best call back number:[phone]        If this is a red flag please contact the nurse immediately.

## 2017-10-07 NOTE — Telephone Encounter (Signed)
Patient needs in person follow-up.  This is not really a gynecologic complaint, so more appropriate for primary care. However if patient prefers to see Dr. [name] in gyn given sensitivity of this exam that is fine too. Either way, next step is she needs to be seen in clinic.

## 2017-10-09 ENCOUNTER — Telehealth (INDEPENDENT_AMBULATORY_CARE_PROVIDER_SITE_OTHER): Payer: Self-pay | Admitting: Internal Medicine

## 2017-10-09 DIAGNOSIS — B80 Enterobiasis: Secondary | ICD-10-CM

## 2017-10-09 DIAGNOSIS — L290 Pruritus ani: Secondary | ICD-10-CM

## 2017-10-09 NOTE — Telephone Encounter (Signed)
Referral Request    Name of PCP Provider: Dellis Anes [facility] Coverage Verified: Active- in network  Who is requesting the referral? Incoming call from patient   What type of referral is being requested? Gastroenterology  Reason for request? Pinworms x7 weeks. Vaginal itching and still seeing pinworms in her underwear.  Has this issue been discussed with provider? yes  Last office visit: 08/21/2017   Next office visit: 10/14/2017  Best way to contact patient: [phone]  Alternative communication method: [phone]    Has been advised this message will be transmitted to office and can expect a response within the next 24-72 hours.

## 2017-10-09 NOTE — Telephone Encounter (Addendum)
Symptom Call      [place] to Triage    Next office visit:  Visit date not found    What symptom is the patient experiencing?    Pinworms x7 weeks  Pt still experiencing vaginal itching and is still seeing pinworms in her underwear  Pt is scheduled on 5/28-fyi    Name of PCP Provider: [name]   Insurance Coverage Verified: Active- in network  Last office visit: 08/21/2017    Who is reporting the symptoms? Incoming call from patient    Is this a new or ongoing symptom? ongoing  Estimated time since experiencing symptom(s)?       Best way to contact patient: [phone]   Alternative communication method: [phone]    Is the call received after 3 PM? No

## 2017-10-09 NOTE — Telephone Encounter (Signed)
TELEPHONE TRIAGE:  PROVIDER RESPONSE REQUESTED: Pt is still symptomatic. Per chart review [name] will send to Derm or Gi if symptoms persisted. RN will pend both GI and Derm referrals   Pinworms--will treat empirically for pinworms.  If her symptoms persist, she will then be referred to Dermatology and/or GI.     DATE: 10/09/2017   TIME: 1:18 PM  Name of PCP Provider: [name]      PATIENT NAME: Brittany [name]  Patient has been informed all calls are recorded for quality assurance.    REASON FOR CALL/CC: Pinworms  Two courses of vermox   Took last pill last Friday 10/03/17  As of Monday pt reports she is still seeing them in underwear and "feeling them all day." Pt states she changes underwear twice a day, changes linens daily, vacuums carpet with flea medication regularly. Seeking referral to derm or GI as indicated on 08/21/17 OV note.    RN Encouraged patient to call back PRN or if symptoms worsen or persist.     RN will forward information to Dr. [name] for review.      Azucena Fallen, RN

## 2017-10-09 NOTE — Telephone Encounter (Signed)
Spoke to patient regarding referral to derm. MyChart message sent as well.

## 2017-10-09 NOTE — Telephone Encounter (Signed)
Derm referral authorized, please advise patient

## 2017-10-09 NOTE — Telephone Encounter (Signed)
RN called and left message for pt to call us back. MyChart message sent. Will continue to follow.    ANY RN CAN TAKE THE CALL.

## 2017-10-10 ENCOUNTER — Encounter (INDEPENDENT_AMBULATORY_CARE_PROVIDER_SITE_OTHER): Payer: Self-pay | Admitting: Internal Medicine

## 2017-10-10 NOTE — Telephone Encounter (Signed)
From: Brittany [name]  To: [name], MD  Sent: 10/10/2017 11:02 AM PDT  Subject: 1-Non Urgent Medical Advice    Thank you, I have an appointment Tuesday 5/28 with Dr. Donnamarie Poag in Internal Medicine.  Brittany [name]    ----- Message -----  From: Harvin Hazel, [place]  Sent: 10/10/17, 10:56 AM  To: Brittany [name]  Subject: RE: 1-Non Urgent Medical Advice    Per Dr Eddie Dibbles covering MD,   "Patient needs in person follow-up. This is not really a gynecologic complaint, so more appropriate for primary care. However if patient prefers to see Dr. [name] in gyn given sensitivity of this exam that is fine too. Either way, next step is she needs to be seen in clinic."    Please call [phone] to make an appt.  Thank you,  Kathlee Nations    ----- Message -----   From: Brittany [name]   Sent: 10/07/2017 9:20 AM PDT   To: [name], MD  Subject: 1-Non Urgent Medical Advice    Hello,  I took the last dose of the second course for pinworms on May 17 but am still seeing them in my underwear, during the day too not just at night. It has been more than 6 weeks since this started. Should I make an appointment with Dr. [name], my OB/GYN?  Thank you,  Brittany [name]    ----- Message -----  From: [name]  Sent: 09/18/2017 8:58 AM PDT  To: Brittany [name]  Subject: RE: 1-Non Urgent Medical Advice  Brittany [name],    The covering MD saw your message and per MD;   "Unusual that pinworms would not resolve with first course of treatment. Also not a really common cause of anal pruritus in adults.    I have sent the refill, but I suspect her pruritus may have other causes. Please advise we need to see her back in clinic if not better after this second course.    I did notify the MD that the Rx went to the wrong pharmacy.   I will let you know when it is sent to the [facility].    Thank you!  -[name]       ----- Message -----   From: Brittany [name]   Sent: 09/16/2017 8:41 PM PDT   To: [name], MD  Subject: 1-Non Urgent Medical  Advice    Hello [name],  The pinworms still haven't gone away. May I have another dose of the medication? I took 2, 2 weeks apart, and have been diligent about cleaning.  Brittany [name]

## 2017-10-10 NOTE — Telephone Encounter (Signed)
Dr Joneen Roach, please see my chart and below encounter. I have teed up Gastroenterology. Please advise/sign order if you agree. I have put in STAT order.

## 2017-10-10 NOTE — Telephone Encounter (Signed)
Message sent to patient to ask if she still need GI referral since she is seeing Dr Donnamarie Poag on 10/14/2017. I have d/c'd order that was teed up.

## 2017-10-10 NOTE — Telephone Encounter (Signed)
FYI. Patient has an appt to see Dr Donnamarie Poag 10/14/2017.

## 2017-10-10 NOTE — Telephone Encounter (Signed)
Message sent

## 2017-10-10 NOTE — Telephone Encounter (Signed)
FYI. Patient stated she will wait until seen. Matter resolved. FYI.

## 2017-10-12 ENCOUNTER — Other Ambulatory Visit (INDEPENDENT_AMBULATORY_CARE_PROVIDER_SITE_OTHER): Payer: Self-pay | Admitting: Internal Medicine

## 2017-10-12 ENCOUNTER — Other Ambulatory Visit: Payer: Self-pay

## 2017-10-12 DIAGNOSIS — M549 Dorsalgia, unspecified: Secondary | ICD-10-CM

## 2017-10-14 ENCOUNTER — Encounter (INDEPENDENT_AMBULATORY_CARE_PROVIDER_SITE_OTHER): Payer: Self-pay | Admitting: Internal Medicine

## 2017-10-14 ENCOUNTER — Other Ambulatory Visit: Payer: BLUE CROSS/BLUE SHIELD | Attending: Internal Medicine | Admitting: Internal Medicine

## 2017-10-14 VITALS — BP 127/69 | HR 69 | Temp 98.0°F | Resp 16 | Ht 69.75 in | Wt 189.8 lb

## 2017-10-14 DIAGNOSIS — L290 Pruritus ani: Principal | ICD-10-CM | POA: Insufficient documentation

## 2017-10-14 DIAGNOSIS — M549 Dorsalgia, unspecified: Secondary | ICD-10-CM

## 2017-10-14 LAB — VAGINOSIS SCREEN
Candida, Vaginal Screen: NEGATIVE
Gardnerella, Vaginal Screen: NEGATIVE
Trichomonas, Vaginal Screen: NEGATIVE

## 2017-10-14 MED ORDER — IBUPROFEN 800 MG OR TABS
800.0000 mg | ORAL_TABLET | Freq: Three times a day (TID) | ORAL | 0 refills | Status: DC | PRN
Start: 2017-10-14 — End: 2018-01-01

## 2017-10-14 NOTE — Progress Notes (Signed)
CC/HPI:  56yoF, former pt of Dr. Dellis Anes, seen in clinic for acute visit for:    Anal itching x 8 wks  All the time. On the outside skin.  Slightly better recently.  No rash or redness. No other areas of itching.  Maybe feels better after washing but not recently.  Lives alone. No close contacts w/ similar symptoms.  Thought she had pinworms and took 2 courses of mebendazole w/ some initial decrease in d/c but w/o change in itching.  No recent travel or medication changes.  No constipation, diarrhea, BRBPR.   Mild white vaginal d/c.  No dysuria.   White strands on her underwear.  Has been using wet wipes and hydrocortisone cream x 2d  No h/o hemorrhoids or psoriasis.    Back pain w/ menstrual cramps  Takes IBP prn. Request refills.    PMH:   Patient Active Problem List   Diagnosis    Infectious mononucleosis    Health maintenance examination    Benign neoplasm of skin, site unspecified    Cold sensitivity    Vitamin D deficiency    Grave's disease    Right foot pain    Dysmenorrhea    Acquired hypothyroidism    Insomnia    Sebaceous cyst    Depression    Low back pain    Fatigue    Palpitations    Skin lesion    Pre-operative examination    Hearing loss    Cerumen impaction    Abdominal pain    Right knee pain    Routine lab draw    Dyspepsia    Intestinal gas excretion    Gluten intolerance    Tension headache    Bilateral low back pain without sciatica    Allergic rhinitis due to pollen    Gastroesophageal reflux disease without esophagitis    Preventative health care    Impingement syndrome, shoulder, right    Radial styloid tenosynovitis    Hyperlipidemia, unspecified hyperlipidemia type    Chronic midline low back pain without sciatica     Meds:   Current Outpatient Medications   Medication Sig Dispense Refill    cetirizine (ZYRTEC) 10 MG tablet Take 10 mg by mouth daily.      clotrimazole (LOTRIMIN) 1 % cream Apply 1 Application topically daily. Apply to affected area  daily. 45 g 3    cyclobenzaprine (FLEXERIL) 5 MG tablet Take 1 tablet (5 mg) by mouth 3 times daily as needed for Muscle Spasms. 30 tablet 1    diclofenac (VOLTAREN) 1 % gel Apply 2 g topically 4 times daily. 1 Tube 3    fluticasone propionate (FLONASE) 50 MCG/ACT nasal spray Spray 1 spray into each nostril 2 times daily. 1 bottle 5    ibuprofen (MOTRIN) 800 MG tablet Take 1 tablet (800 mg) by mouth every 8 hours as needed for Mild Pain (Pain Score 1-3) or Moderate Pain (Pain Score 4-6). 30 tablet 0    levonorgestrel-ethinyl estradiol (AVIANE) 0.1-20 MG-MCG tablet Take 1 tablet by mouth daily. 84 tablet 3    levothyroxine (SYNTHROID) 112 MCG tablet Take 1 tablet (112 mcg) by mouth every other day. Alternating with 125 mcg tablet every other day 45 tablet 1    levothyroxine (SYNTHROID) 125 MCG tablet Take 1 tablet (125 mcg) by mouth every other day. Alternating with 112 mcg tablet every other day. 45 tablet 1    mebendazole (VERMOX) 100 MG chewable tablet Chew one tablet now and one  in 2 weeks 2 tablet 0    metroNIDAZOLE (METROGEL) 1 % gel Apply 1 Application topically daily. Apply to affected area daily. 1 Tube 3    tretinoin (RETIN-A) 0.025 % cream Apply a thin layer at bedtime as directed 1 Tube 3    valACYclovir (VALTREX) 1 GM tablet Take 1 tablet (1,000 mg) by mouth 2 times daily. 20 tablet 2     No current facility-administered medications for this visit.      Allergies   Allergen Reactions    Vicodin [Hydrocodone-Acetaminophen] Hallucinations     PE:    10/14/17  1358   BP: 127/69   Pulse: 69   Resp: 16   Temp: 98 F (36.7 C)   SpO2: 98%   Gen: in NAD  Genitals: Labia w/ mild erythema, no vaginal d/c  Anus: small external hemorrhoid. No rash, fissures or redness.    A/P:  Brittany [name] was seen today for follow up.  Diagnoses and all orders for this visit:    Anal itching  Comment: Diff'l includes pruritis ani, hemorrhoids, inverse psoriasis. Doubt infection given treatment w/ 2 course of mebendazole and  no recent travel.  Plan: Discussed avoiding wetwipes and douches. Using only a mild soap and washing 1x/d. Can use thin layer of vaseline for moisturizer.  -     Check Vaginosis Screen [facility] Affirm Collection System    Back pain  Comment: w/ menstrual periods  Plan:   Refill IBP prn cramping/back pain w/ menses  -     ibuprofen (MOTRIN) 800 MG tablet; Take 1 tablet (800 mg) by mouth every 8 hours as needed for Mild Pain (Pain Score 1-3) or Moderate Pain (Pain Score 4-6).    F/u 39mo as scheduled to establish care w/ PCP.

## 2017-10-14 NOTE — Patient Instructions (Signed)
Try washing once a day with Dove sensitive skin soap.  Just pat dry.    Just wipe front to back after going to bathroom.    Can use wet/dry toilet paper.  Avoid douches, baby wipes and topical creams.    Can use a thin layer of vaseline if needed.

## 2017-10-14 NOTE — Telephone Encounter (Signed)
This med is over 52 years old, rx at that time for acute back pain. Not meant to be chronic.   She can discuss appropriateness of refill at either her visit today with Dr. Donnamarie Poag or her establish care visit with Dr. Laurance Flatten next month

## 2017-10-14 NOTE — Telephone Encounter (Signed)
Medication requested:   Requested Prescriptions     Pending Prescriptions Disp Refills    ibuprofen (MOTRIN) 800 MG tablet 30 tablet 0     Sig: Take 1 tablet (800 mg) by mouth every 8 hours as needed for Mild Pain (Pain Score 1-3) or Moderate Pain (Pain Score 4-6).       Date of last refill:  06/12/2015     Last OV (provider):      08/21/2017  Last OV (department): [facility]    Next OV (provider):      10/14/2017   Next OV (department): [facility]    Pertinent labs:

## 2017-10-14 NOTE — Progress Notes (Signed)
Pt requesting refill for motrin pharmacy denied refill request

## 2017-10-15 ENCOUNTER — Encounter (INDEPENDENT_AMBULATORY_CARE_PROVIDER_SITE_OTHER): Payer: Self-pay | Admitting: Internal Medicine

## 2017-10-16 ENCOUNTER — Encounter (INDEPENDENT_AMBULATORY_CARE_PROVIDER_SITE_OTHER): Payer: Self-pay | Admitting: Physician Assistant

## 2017-10-16 ENCOUNTER — Other Ambulatory Visit: Payer: BLUE CROSS/BLUE SHIELD | Attending: Physician Assistant | Admitting: Physician Assistant

## 2017-10-16 VITALS — BP 115/46 | HR 80 | Temp 98.3°F | Resp 17 | Ht 69.75 in | Wt 189.0 lb

## 2017-10-16 DIAGNOSIS — R300 Dysuria: Principal | ICD-10-CM | POA: Insufficient documentation

## 2017-10-16 DIAGNOSIS — R399 Unspecified symptoms and signs involving the genitourinary system: Secondary | ICD-10-CM | POA: Insufficient documentation

## 2017-10-16 LAB — URINALYSIS
Bilirubin: NEGATIVE
Glucose: NEGATIVE
Ketones: NEGATIVE
Leuk Esterase: NEGATIVE
Nitrite: NEGATIVE
Protein: NEGATIVE
Specific Gravity: 1.006 (ref 1.002–1.030)
Urobilinogen: NEGATIVE
pH: 6 (ref 5.0–8.0)

## 2017-10-16 LAB — UA, CHEM ONLY POCT
Bilirubin: NEGATIVE
Glucose: NEGATIVE
Ketones: NEGATIVE
Leuk Esterase: NEGATIVE
Nitrite: NEGATIVE
Protein: NEGATIVE
Specific Gravity: <=1.005 (ref 1.002–1.030)
Urobilinogen: 0.2 (ref 0.2–1.0)
pH: 5.5 (ref 5–8)

## 2017-10-16 NOTE — Patient Instructions (Signed)
Dysuria, Female    You have been seen for painful urination.    The medical term for painful urination is "Dysuria." There are several causes of painful urination. The most common is an infection in the bladder or kidneys.    Dysuria can also be caused by sexually-transmitted diseases like chlamydia or gonorrhea. Ovarian cysts, kidney stones, endometriosis and certain medicines are also causes. Cancer can be a cause, but this is rare. Dysuria can also happen because of local irritants. This could be douching, vaginal lubricants or scented feminine products or toilet paper.    We are not sure what has caused your case. It does not seem to be from anything dangerous. It is OK for you to go home today.    You should see your family doctor or gynecologist in a few days.    YOU SHOULD SEEK MEDICAL ATTENTION IMMEDIATELY, EITHER HERE OR AT THE NEARST EMERGENCY DEPARTMENT IF ANY OF THE FOLLOWING OCCUR:   You have fever (temperature higher than 100.4F / 38C).   You have abdominal or pelvic pain.   You are vomiting.   You have worsening pain.   If you cannot urinate.

## 2017-10-16 NOTE — Progress Notes (Signed)
Brittany [name]  MRN: [id number]  DOB: 11/26/1965  PMD: [name]        Chief Complaint:   Chief Complaint   Patient presents with    UTI Symptoms     frequent, burning and urgent urination       HPI:  Brittany [name] is a 52 year old female presents with intermittent dysuria, urinary urgency and frequency x2wks   Denies hematuria, abdominal pain, or flank pain.  No f/c.  No n/v.  Drinking fluids well.   Recently treated for pin worms, no complications.    Denies vaginal d/c, irregular bleeding, or dyspareunia.  LMP just ended.  Sexual hx: not active   No h/o frequent UTI.  No h/o kidney stones.    Past Medical History:   Diagnosis Date    Hypothyroidism     h/o hyperthyroidism s/p radioactive iodine    Infectious mononucleosis 08/2006    Insomnia     Menorrhagia      Past Surgical History:   Procedure Laterality Date    CHOLECYSTECTOMY, LAP  1995    left wrist tendon repair  1998    PB MYOMECTOMY 1-4 MYOMAS 250 GM/< VAGINAL APPR      PB REMOVE TONSILS/ADENOIDS,<12 Y/O  1975    wisdom teeth       Social History     Socioeconomic History    Marital status: Divorced     Spouse name: Not on file    Number of children: Not on file    Years of education: Not on file    Highest education level: Not on file   Occupational History    Occupation: Office manager   Social Needs    Financial resource strain: Not on file    Food insecurity:     Worry: Not on file     Inability: Not on file    Transportation needs:     Medical: Not on file     Non-medical: Not on file   Tobacco Use    Smoking status: Never Smoker    Smokeless tobacco: Never Used   Substance and Sexual Activity    Alcohol use: Yes     Comment: 1 bottle wine /week    Drug use: No    Sexual activity: Not Currently   Lifestyle    Physical activity:     Days per week: Not on file     Minutes per session: Not on file    Stress: Not on file   Relationships    Social connections:     Talks on phone: Not on file     Gets together: Not on  file     Attends religious service: Not on file     Active member of club or organization: Not on file     Attends meetings of clubs or organizations: Not on file     Relationship status: Not on file    Intimate partner violence:     Fear of current or ex partner: Not on file     Emotionally abused: Not on file     Physically abused: Not on file     Forced sexual activity: Not on file   Other Topics Concern    Not on file   Social History Narrative    Not on file     Family History   Problem Relation Name Age of Onset    Breast Cancer Mother  84    Other  Sister          Uterine fibroids/hysterectomy    Crohn's Disease Sister      Breast Cancer Sister  87    Ovarian Cancer Neg Hx          but sister w/ovarian cysts age 56     Current medications:  Current Outpatient Medications   Medication Sig    cetirizine (ZYRTEC) 10 MG tablet Take 10 mg by mouth daily.    clotrimazole (LOTRIMIN) 1 % cream Apply 1 Application topically daily. Apply to affected area daily.    cyclobenzaprine (FLEXERIL) 5 MG tablet Take 1 tablet (5 mg) by mouth 3 times daily as needed for Muscle Spasms.    diclofenac (VOLTAREN) 1 % gel Apply 2 g topically 4 times daily.    fluticasone propionate (FLONASE) 50 MCG/ACT nasal spray Spray 1 spray into each nostril 2 times daily.    ibuprofen (MOTRIN) 800 MG tablet Take 1 tablet (800 mg) by mouth every 8 hours as needed for Mild Pain (Pain Score 1-3) or Moderate Pain (Pain Score 4-6).    levonorgestrel-ethinyl estradiol (AVIANE) 0.1-20 MG-MCG tablet Take 1 tablet by mouth daily.    levothyroxine (SYNTHROID) 112 MCG tablet Take 1 tablet (112 mcg) by mouth every other day. Alternating with 125 mcg tablet every other day    levothyroxine (SYNTHROID) 125 MCG tablet Take 1 tablet (125 mcg) by mouth every other day. Alternating with 112 mcg tablet every other day.    mebendazole (VERMOX) 100 MG chewable tablet Chew one tablet now and one in 2 weeks    metroNIDAZOLE (METROGEL) 1 % gel Apply 1  Application topically daily. Apply to affected area daily.    tretinoin (RETIN-A) 0.025 % cream Apply a thin layer at bedtime as directed    valACYclovir (VALTREX) 1 GM tablet Take 1 tablet (1,000 mg) by mouth 2 times daily.     No current facility-administered medications for this visit.        Review of Systems   Constitutional: No fever, chills, malaise  Gastrointestinal: No abdominal pain, n/v/d/c  Genitourinary: see HPI  Musculoskeletal: No acute back pain  Skin:  No rash   Hematologic/lymphatic:  No swollen or painful LN     Physical Examination:    10/16/17  1700   BP: 115/46   Pulse: 80   Resp: 17   Temp: 98.3 F (36.8 C)   SpO2: 96%     Constitutional:  Well appearing, no distress  CV: RRR   Lungs clear.    Abdominal:  ND, soft and non-tender.   GU:  No CVAT.   Skin:  Warm, dry.    Diagnostics (if performed):  Results for orders placed or performed in visit on 10/16/17   UA, Chem Only (POCT)   Result Value Ref Range    Color yellow     Appearance clear     Glucose neg Neg    Bilirubin neg Neg    Ketones neg Neg    Specific Gravity <=1.005 1.002 - 1.030    Blood 2+ (A) Neg    pH 5.5 5 - 8    Protein neg Neg    Urobilinogen 0.2 0.2 - 1.0    Nitrite neg     Leuk Esterase neg Neg       Impression/Plan:   52 year old female with intermittent symptoms of dysuria, urgency and frequency.  Denies abdominal pain or back pain.  Denies fevers, chills, nausea, vomiting or other systemic symptoms.  Vital signs are stable and patient overall appears well clinically, she has 2+ blood on her urinalysis although she just ended her LMP.  Suspicion for UTI is low, symptoms at this time are nonspecific, we will send a formal urinalysis and culture.  Low suspicion for urethritis, pyelonephritis, dehydration, stones, hydronephrosis, or systemic infx.     -Rx: none at this time, cultures pending  -Hydrate  -Primary care f/u  -Return precautions      ICD-10-CM ICD-9-CM    1. UTI symptoms R39.9 788.99 UA, Chem Only (POCT)        Shona Needles PA

## 2017-10-16 NOTE — Interdisciplinary (Signed)
Provider at bedside

## 2017-10-16 NOTE — Interdisciplinary (Signed)
Name/DOB verified.  Pt roomed, VS, and intake done.   Informed on plan of care and aware of delay; pt verbalized understanding.   Ambulatory to clinic: yes  Comfort measures offered: yes   Vitals stable: yes  Pain addressed: yes

## 2017-10-16 NOTE — Interdisciplinary (Signed)
Patient Discharge/Education  AVS printed/explained to pt: Yes  Learner: pt  Pain addressed: yes  Method: Explanation and Handout  Treatment education given: Yes  Response: Verbalizes understanding  Stable upon discharge: Yes; ambulatory to exit   Pleased w/ service and informed of delay: Yes

## 2017-10-17 LAB — CHLAMYDIA/GONORRHEA PCR, URINE
Chlamydia trachomatis PCR, Urine: NOT DETECTED
Neisseria gonorrhoeae PCR, Urine: NOT DETECTED

## 2017-10-18 LAB — URINE CULTURE

## 2017-10-24 ENCOUNTER — Other Ambulatory Visit: Payer: BLUE CROSS/BLUE SHIELD | Attending: Emergency Medicine | Admitting: Emergency Medicine

## 2017-10-24 ENCOUNTER — Encounter (INDEPENDENT_AMBULATORY_CARE_PROVIDER_SITE_OTHER): Payer: Self-pay

## 2017-10-24 VITALS — BP 115/79 | HR 95 | Temp 98.1°F | Resp 17 | Ht 69.75 in | Wt 185.0 lb

## 2017-10-24 DIAGNOSIS — L290 Pruritus ani: Secondary | ICD-10-CM

## 2017-10-24 NOTE — Interdisciplinary (Signed)
Name/DOB verified.  Pt roomed, VS, and intake done.   Informed on plan of care and aware of delay; pt verbalized understanding.   Ambulatory to clinic: yes  Comfort measures offered: yes   Vitals stable: yes  Pain addressed: yes

## 2017-10-24 NOTE — Progress Notes (Signed)
The Date of Service for the Emergency Room encounter is No admission date for patient encounter.     Chief Complaint   Patient presents with    Other     pt has been dealing with pin worms for 2 months now, itching. Was seen by internal medicine but has not been resolved.         I have reviewed EPIC for pertinent medical history information    HPI:  52 year old female with c/o anal itching for 2.5 months.  She has been treated twice with mebendazole courses, which she says she completed.  Last completion ~2 weeks ago per pt.  She says her anal itching persists.  She has seen her PCP/IM for this same.  She states she just had a workup for "vaginitis" and UTI last week by IM.  She states this was "ruled out."  No dysuria.  No vaginal discharge.  Slight vaginal itching but she feels this is related to her anal itching.  No anal discharge.  She denies diarrhea but says she normally has 3 stools a day.  No blood in stools.      Her reason for visit today is to have a piece of tape with white speck on it sent to lab for testing to see if she still has pin worms.    She declines an exam today otherwise.  She states she also has a dermatology apt for this on 6/19 and another IM f/u on 6/26.    She denies known exposure with worms.  No foreign travel.  No kids at home.  No DM.  No fevers.  No abd or pelvic pain.  No LBP.  Has hypothyroidism but no med changes and pt says recent levels were normal.       Past Medical History:   Diagnosis Date    Hypothyroidism     h/o hyperthyroidism s/p radioactive iodine    Infectious mononucleosis 08/2006    Insomnia     Menorrhagia      Past Surgical History:   Procedure Laterality Date    CHOLECYSTECTOMY, LAP  1995    left wrist tendon repair  1998    PB MYOMECTOMY 1-4 MYOMAS 250 GM/< VAGINAL APPR      PB REMOVE TONSILS/ADENOIDS,<12 Y/O  1975    wisdom teeth       Social History     Tobacco Use    Smoking status: Never Smoker    Smokeless tobacco: Never Used   Substance Use  Topics    Alcohol use: Yes     Comment: 1 bottle wine /week     Family History   Problem Relation Name Age of Onset    Breast Cancer Mother  29    Other Sister          Uterine fibroids/hysterectomy    Crohn's Disease Sister      Breast Cancer Sister  62    Ovarian Cancer Neg Hx          but sister w/ovarian cysts age 25       Current Outpatient Medications on File Prior to Visit   Medication Sig Dispense Refill    cetirizine (ZYRTEC) 10 MG tablet Take 10 mg by mouth daily.      clotrimazole (LOTRIMIN) 1 % cream Apply 1 Application topically daily. Apply to affected area daily. 45 g 3    cyclobenzaprine (FLEXERIL) 5 MG tablet Take 1 tablet (5 mg) by mouth 3 times  daily as needed for Muscle Spasms. 30 tablet 1    diclofenac (VOLTAREN) 1 % gel Apply 2 g topically 4 times daily. 1 Tube 3    fluticasone propionate (FLONASE) 50 MCG/ACT nasal spray Spray 1 spray into each nostril 2 times daily. 1 bottle 5    ibuprofen (MOTRIN) 800 MG tablet Take 1 tablet (800 mg) by mouth every 8 hours as needed for Mild Pain (Pain Score 1-3) or Moderate Pain (Pain Score 4-6). 30 tablet 0    levonorgestrel-ethinyl estradiol (AVIANE) 0.1-20 MG-MCG tablet Take 1 tablet by mouth daily. 84 tablet 3    levothyroxine (SYNTHROID) 112 MCG tablet Take 1 tablet (112 mcg) by mouth every other day. Alternating with 125 mcg tablet every other day 45 tablet 1    levothyroxine (SYNTHROID) 125 MCG tablet Take 1 tablet (125 mcg) by mouth every other day. Alternating with 112 mcg tablet every other day. 45 tablet 1    mebendazole (VERMOX) 100 MG chewable tablet Chew one tablet now and one in 2 weeks 2 tablet 0    metroNIDAZOLE (METROGEL) 1 % gel Apply 1 Application topically daily. Apply to affected area daily. 1 Tube 3    tretinoin (RETIN-A) 0.025 % cream Apply a thin layer at bedtime as directed 1 Tube 3    valACYclovir (VALTREX) 1 GM tablet Take 1 tablet (1,000 mg) by mouth 2 times daily. 20 tablet 2     No current  facility-administered medications on file prior to visit.        Allergies   Allergen Reactions    Vicodin [Hydrocodone-Acetaminophen] Hallucinations       ROS: Review of Systems -   No fever or chills  No nausea, vomiting or diarrhea  No dysuria  No chest pain or SOB      PHYSICAL EXAMINATION:  WNWD female NAD,   nontoxic  BP 115/79    Pulse 95    Temp 98.1 F (36.7 C)    Resp 17    Ht 5' 9.75" (1.772 m)    Wt 83.9 kg (185 lb)    SpO2 98%    BMI 26.74 kg/m   HEENT: ncat, perrl, eomi, sclera not icteric  moist mucus membranes  NECK: supple  CHEST: normal respiratory effort  NEURO: alert  Pt declines other exam today.      Assessment & Plan:  52 year old female with anal pruritis     Orders Placed This Encounter   Procedures    Standard Ova And Parasite Exam    Pinworm Exam Pinworm Paddle       ED COURSE:    DIAGNOSTIC STUDIES:  Sent - pt will f/u with PCP/IM for results     Medical Decision Making:    Anal pruritis.  Already treated x2 for worms.  Pt here in [place] just to have sample sent.  Pt agrees to f/u with her PCP for results.  She already has apt scheduled.  She also has derm f/u for this issue.    Despite encouraging pt to have an anal/rectal/vaginal exam today, pt declines these.    D/W pt precautions for ER return and f/u instructions.  All questions answered.      I discussed with patient the nature of the illness/problem, results of all resulted tests, course of treatment, and prospects for recovery/follow up care needs. The patient verbalized understanding and agreement with plan. All questions answered. Parameters of returning to ED also discussed with patient, who voiced understanding. The  patient understands to return immediately if the symptoms worsen or new symptoms develop or if symptoms persist.     DIAGNOSIS:     ICD-10-CM ICD-9-CM    1. Anal pruritus L29.0 698.0 Standard Ova And Parasite Exam      Stool Culture [employer]      Pinworm Exam Pinworm Paddle         Disposition:  home

## 2017-10-24 NOTE — Patient Instructions (Signed)
Anal Pruritus    You have been diagnosed with anal pruritus.     You have anal pruritus when your anus itches. The anus is the part of your body where stool (poop) comes out. Anal pruritus has many causes. You can get it from certain skin diseases. You can also get it from large veins in your anus, called hemorrhoids. Anal pruritus is most common in men and in people between the ages of 40 and 60. There are also diseases that can give you anal pruritus. Some of these diseases are diabetes, cancer, thyroid diseases, sexually transmitted diseases, psoriasis, and pinworms.     Certain foods like chocolate and tomatoes can also irritate your anus. So can drinking too much soda or coffee.  If you have a lot of issues with diarrhea and having episodes of "soiling" your pants, there can be irritation from wiping and scratching.    The doctor will often look at your anus. This is to check for broken skin, infection, and pus drainage. Pus is a thick liquid that is white or yellow. Your doctor might also give you some lab tests or an endoscopy. In an endoscopy, a small camera will be put into your anus.    The doctor will treat the cause of your anal pruritus. Your doctor might also show you some things that you can do to make your itching better.    To help your symptoms, you can do the following at home:   Keep the skin clean and dry. Use talcum powder around your anus.   Use anti-itch medicines like hydrocortisone or Preparation H. You do not need a prescription to buy these medicines.    After you have a bowel movement (poop), wipe your anus with a moist towel or baby wipe.   Don't use cleaners or deodorants in this area as they can cause irritation    Though we don't believe your condition is serious right now, it is important to be careful. Sometimes a problem that seems mild can become serious later. This is why it is very important that you return here or go to the nearest Emergency Department if you are  not improving or your symptoms are getting worse.    YOU SHOULD SEEK MEDICAL ATTENTION IMMEDIATELY, EITHER HERE OR AT THE NEAREST EMERGENCY DEPARTMENT, IF ANY OF THE FOLLOWING OCCUR:     You have a lot of bleeding from your anus.   You have a fever (temperature higher than 100.4F or 38C).   You see pus is draining from the area around your anus. Pus is a thick liquid that is white or yellow.    If you can't follow up with your doctor, or if at any time you feel you need to be rechecked or seen again, come back here or go to the nearest emergency department.

## 2017-10-24 NOTE — Interdisciplinary (Signed)
Patient Discharge/Education  AVS printed/explained to pt: Yes  Learner: patient  Pain addressed: yes  Method: Explanation and Handout  Treatment education given: Yes  Response: Verbalizes understanding  Stable upon discharge: Yes; stable to exit   Pleased w/ service and informed of delay: Yes

## 2017-10-25 ENCOUNTER — Other Ambulatory Visit (INDEPENDENT_AMBULATORY_CARE_PROVIDER_SITE_OTHER): Payer: BLUE CROSS/BLUE SHIELD | Attending: Emergency Medicine

## 2017-10-25 DIAGNOSIS — L290 Pruritus ani: Secondary | ICD-10-CM

## 2017-10-26 LAB — STANDARD O&P
Ova & Parasite Result: NEGATIVE
Trichrome Stain Result: NEGATIVE

## 2017-10-27 ENCOUNTER — Ambulatory Visit (INDEPENDENT_AMBULATORY_CARE_PROVIDER_SITE_OTHER): Payer: BLUE CROSS/BLUE SHIELD | Admitting: Dermatology

## 2017-10-27 LAB — PINWORM EXAM: Pinworm Exam: NEGATIVE

## 2017-10-29 ENCOUNTER — Telehealth (INDEPENDENT_AMBULATORY_CARE_PROVIDER_SITE_OTHER): Payer: Self-pay | Admitting: Internal Medicine

## 2017-10-29 NOTE — Telephone Encounter (Signed)
Forwarded to Dr Joneen Roach for advise of test results done at [facility] on 10/25/2017. Thank you.

## 2017-10-29 NOTE — Telephone Encounter (Signed)
Who is calling: Incoming call from patient  Insurance Coverage Verified: Active- in network  Reason for this call: requesting a call back to discuss lab results done Saturday June 8th      Action required by office: Please expedite request    Duplicate encounter? No previous documentation found on this issue.     Best way to contact: [phone]    Inquiry has been read verbatim to this caller. Verbalizes satisfaction and confirms the above is accurate: yes      Has been advised this message will be transmitted to office and can expect a response within the next 24-72 hours.

## 2017-10-29 NOTE — Telephone Encounter (Signed)
Please advise patient all studies are negative. On stool study is still preliminary so final result pending, but prelim result negative. No evidence  Pinworms, parasite or other infection.

## 2017-10-29 NOTE — Telephone Encounter (Signed)
Called and spoke with patient. Informed of result per Dr Gaspar Skeeters message below. Verbalized good understanding. Patient appreciate the call. Matter resolved. FYI.

## 2017-10-30 ENCOUNTER — Ambulatory Visit (INDEPENDENT_AMBULATORY_CARE_PROVIDER_SITE_OTHER): Payer: BLUE CROSS/BLUE SHIELD | Admitting: Dermatology

## 2017-10-30 ENCOUNTER — Encounter (INDEPENDENT_AMBULATORY_CARE_PROVIDER_SITE_OTHER): Payer: Self-pay | Admitting: Internal Medicine

## 2017-10-30 DIAGNOSIS — Z0000 Encounter for general adult medical examination without abnormal findings: Secondary | ICD-10-CM

## 2017-10-30 LAB — STOOL CULTURE: Shiga Toxin Result: NEGATIVE

## 2017-11-04 ENCOUNTER — Other Ambulatory Visit: Payer: Self-pay

## 2017-11-05 ENCOUNTER — Ambulatory Visit (INDEPENDENT_AMBULATORY_CARE_PROVIDER_SITE_OTHER): Payer: BLUE CROSS/BLUE SHIELD | Admitting: Dermatology

## 2017-11-05 ENCOUNTER — Encounter (INDEPENDENT_AMBULATORY_CARE_PROVIDER_SITE_OTHER): Payer: Self-pay | Admitting: Dermatology

## 2017-11-05 DIAGNOSIS — D229 Melanocytic nevi, unspecified: Secondary | ICD-10-CM

## 2017-11-05 DIAGNOSIS — Z1283 Encounter for screening for malignant neoplasm of skin: Secondary | ICD-10-CM

## 2017-11-05 DIAGNOSIS — I781 Nevus, non-neoplastic: Secondary | ICD-10-CM

## 2017-11-05 DIAGNOSIS — L290 Pruritus ani: Secondary | ICD-10-CM

## 2017-11-05 DIAGNOSIS — D485 Neoplasm of uncertain behavior of skin: Principal | ICD-10-CM

## 2017-11-05 DIAGNOSIS — D1801 Hemangioma of skin and subcutaneous tissue: Secondary | ICD-10-CM

## 2017-11-05 DIAGNOSIS — L821 Other seborrheic keratosis: Secondary | ICD-10-CM

## 2017-11-05 DIAGNOSIS — D239 Other benign neoplasm of skin, unspecified: Secondary | ICD-10-CM

## 2017-11-05 DIAGNOSIS — L578 Other skin changes due to chronic exposure to nonionizing radiation: Secondary | ICD-10-CM

## 2017-11-05 DIAGNOSIS — L814 Other melanin hyperpigmentation: Secondary | ICD-10-CM

## 2017-11-05 DIAGNOSIS — L705 Acne excoriee des jeunes filles: Secondary | ICD-10-CM

## 2017-11-05 NOTE — Progress Notes (Signed)
[facility]   [place], [place] [address]  [phone]     Primary MD: [name]  Consult Requested By: Linna Darner    HISTORY:  Seanne Chirico is a 52 year old female here for TBSE. Last office visit 12/16/16 by Dr. Lilyan Gilford.    Lesion of concern for skin cancer today:   1. New lesion on chest, kind of red. Present on 5 days. Denies itch and pain. Maybe going away  2. Anal pruritus x 2 months. Thought she had pinworms. Changed diet with much improvement. Stools 3-4 times a day    Sun Protection:  intermittent  Melanoma hx: negative   NMSC hx: negative   Family Hx of melanoma: negative   Family hx of skin disease: positive mother had 3 NMSC    Otherwise denies other new itching, bleeding, painful, growing, ulcerating, or concerning lesions.    The patient  has a past medical history of Hypothyroidism, Infectious mononucleosis (08/2006), Insomnia, and Menorrhagia.    Medications: reviewed    Allergies: Vicodin [hydrocodone-acetaminophen]    REVIEW OF SYSTEMS:  CONSTITUTIONAL: negative for fever or chills  GASTROINTESTINAL: nausea, vomiting, diarrhea  DERMATOLOGIC: Feels well, no other skin complaints.    PHYSICAL EXAM:  Skin Type:                    2  General Appearance: within normal limits  Neuro: Alert and oriented x 3  Psych: Mood and affect within normal limits  Areas examined included: scalp, forehead, cheeks, nose, lips, chin, ears, sclera, conjunctiva, eyelids, neck, RUE, LUE,hands, chest, abdomen, back, RLE, LLE, feet, buttock, fingernails and toenails    Pertinent findings below:  -well demarcated, brown, stuck on appearing plaques on head, trunk and extremities  -feathery brown macules on the face, forearms and shoulders  -scattered even-bordered, even-pigmented macules and papules on the head, trunk and extremities  -scattered 2-4 mm cherry red macules  -excoriation of the chin x2  -pink brown papule with + dimple sign on the right knee, right calf  Dyschromia,  thinning, loss of elasticity, and wrinkling of the skin in sun exposed areas    ASSESSMENT AND TREATMENT PLAN:    Neoplasm of uncertain behavior skin  Site: chest   Differential Diagnosis includes arthropod bite   - recommend over the counter hydrocortisone cream until resolved    Excoriation on the chin  -don't pick at face    Seborrheic Keratosis and Lentigines  - reassurance of benign nature of lesions, questions answered  - no treatment necessary today    Multiple Benign Nevi  - no lesions clinically suspicious for malignancy on exam today, advise continued surveillance given history of sun damage  - reassurance of benign nature of lesions, questions answered  - handout provided on the ABCDEs of melanoma     Cherry Angiomas  - reassurance of benign nature of lesions, questions answered  - no treatment necessary today    Dermatofibroma  - reassurance of benign nature of lesions, questions answered  - no treatment necessary today    Sun Damaged Skin and Skin Health Maintenance    - Importance of TBSE by physician reviewed with patient  - Discussed importance of sun protection, including avoidance of sun at peak hours (10 am-4 pm), protective clothing, and use of sunscreen with SPF 30 or greater with frequent reapplication (every 1 to 1.5 hours, or more frequently if in water or if heavy sweating).  Best  sunscreens use zinc or titanium oxide as the active ingredient and act as a physical blocker.     Pruritus ani  - recommend rinsing anus with water after stooling  - wipe with mineral or baby oil on cotton pad     RTC 12 months TBSE  PRN sooner     Marguerite Olea, MD, PhD      Scribe Attestation  The notes I am recording reflect only actions made by and judgments taken by this provider, Dr. Jerene Dilling, for whom I am scribing today. I have performed no independent clinical work.    Revonda Standard    ____________________________________________________________________    Provider Attestation for Scribed Note    As the  attending provider, I agree with the scribed content.  Any changes or edits are noted in the text above.    Chaston Bradburn

## 2017-11-05 NOTE — Patient Instructions (Addendum)
[place]      [facility]    SUN PROTECTION      Quick info about SUNSCREENS:    For acne prone skin:   Use a facial moisturizer with an SPF that says NON-COMEDOGENIC and OIL-FREE on it.        Examples:  ELTA MD UV Clear SPF46 facial moisturizer with sunscreen (can purchase online or at front desk of [place]/[facility] office)  La Roche-Posay Anthelios Clear Skin Dry Touch Sunscreen SPF 60   Aveeno Positively Radiant Daily Facial Moisturizer With Broad Spectrum SPF 30  Neutrogena Sheer Zinc Sunscreen Face Lotion - SPF 50  Neutrogena Clear Face Sunscreen Lotion - SPF 64    For sensitive skin (best if the product has only titanium dioxide or zinc oxide in it):   Solbar SPF30 gel  Vanicream sunscreen for sensitive skin SPF50+    Sunscreens with zinc oxide or titanium dioxide provide broader spectrum coverage against all wavelengths of UV light.     Sunscreen needs to be reapplied every 1.5 to 2 hours when you are outside.     Be aware of reflective light off water and snow.    Consider wearing sun protective clothing and hats (UPF instead of SPF), this provides more even/uniform coverage than sunscreen.  There are many companies now selling this clothing, I buy my clothing from a company called [employer].     Remember to avoid sun during the peak hours of 10 am - 2 pm.  Wear a wide brimmed hat when outside.  Wear sunglasses that will cover your entire eye and upper cheeks if possible.      More in-depth information:    CoachingBuilder.tn    http://www.myers.net/    https://www.melanoma.org    Why protect against the sun?  In the past, sun exposure was thought to be a healthy benefit of outdoor activity. However, studies have shown many unhealthy effects of sun exposure, such as early aging of the skin and skin cancer.    What kind of damage does sun exposure cause?  Part of the sun's energy that reaches earth is composed of rays of invisible ultraviolet (UV) light. When ultraviolet light  rays (UVA and UVB) enter the skin, they damage skin cells, causing visible and invisible injuries.  Sunburn is a visible type of damage, which appears just a few hours after sun exposure. In many people, this type of damage also causes tanning. Freckles, which occur in people with fair skin, are usually due to sun exposure. Freckles are nearly always a sign that sun damage has occurred, and therefore show the need for sun protection.  Ultraviolet light rays also cause invisible damage to skin cells. Some of the injury is repaired, but some of the cell damage adds up year after year. After 20 to 30 years or more, the built-up damage appears as wrinkles, age spots, and even skin cancer. Although window glass blocks UVB light, UVA rays are able to penetrate through glass.    How can I protect myself from excessive sun exposure?   Avoidance. Stay away from the sun in the middle of the day.  Nancy Fetter exposure is more intense closer to the equator, in the mountains, and in the summer. The sun's damaging effects are increased by reflection from water, white sand, and snow. Avoid long periods of direct sun exposure. Sit or play in the shade, especially when your shadow is shorter than you are tall.   Use sun protective clothing.  Cover  up with light colored clothing when outdoors, including a hat to protect the scalp and face. In addition to filtering out the sun, tightly woven clothing reflects heat and helps keep you feeling cool. Sunglasses that block ultraviolet rays protect the eyes and eyelids. Multiple retailers now sell sun protective clothing for adults and children.  Rash guards should be worn during outdoor swimming activity.   Block sun damage by applying a broad-spectrum UVA and UVB sunscreen with an SPF of 30 or higher and reapply approximately every two hours, even on cloudy days. If swimming or participating in intense physical activity, sunscreen may need to be applied more often.  How do I select the right  sunscreen?    Choose a sunscreen with a SPF 30 or higher. The protective ability of sunscreen is rated by Nancy Fetter Protection Factor (SPF) -- the higher the SPF, the stronger the protection. Spread it evenly over all uncovered skin, including ears and lips, but avoid the eyelids. Apply sunscreen about 30 minutes before sun exposure. Re-apply after swimming or excessive sweating.    Most importantly, choose a sunscreen that you will wear.  New sunscreens are added to the marketplace frequently, and selection of a particular brand is often a matter of personal preference.       What about the controversies regarding sunscreens?    Hats, clothing, and shade are the most reliable forms of sun protection.  Few people use enough sunscreen to benefit from the [facility] protection listed on the label. Most practitioners in our group continue to recommend and utilize many sunscreen products, including chemical and physical sunscreens.  However, studies show that people typically use about a quarter of the recommended amount.     There has been much news coverage recently about the possible dangers of sunscreens. The Environmental Working Group publishes an annual sunscreen report.  The 2010 report, which received a considerable amount of media attention, cited concerns about a form of vitamin A called retinyl palmitate, found in many sunscreens. Safety testing is far from conclusive, and the FDA continues to investigate whether this compound may accelerate skin damage and elevate skin cancer risk when applied to skin exposed to sunlight.      Many have also raised concerns about chemical sunscreens, which contain substances such as oxybenzone, avobenzone, benzophenone, and parsol 1789. Some believe that these may be converted into hormones when absorbed through the skin. Some prefer physical agents such as zinc oxide or titanium dioxide, however efforts to make these agents more sheer and cosmetically acceptable have resulted in  formulations that may contain tiny particles known as nanoparticles.  Concerns also exist about their absorption.  Most of the above concerns are theoretical; however, if you are highly concerned about these issues, you can visit the Environmental Working Groups website devoted to sunscreen safety: http://www.marquez-love.com/ sunscreen.   Most dermatologists remain convinced that the risks of unprotected sun exposure far outweigh the above mentioned theoretical risks of sunscreens. The type of sunprotection used remains an individual choice for each parent.    What about vitamin D?    Vitamin D is essential for many processes in the body, and is important for bone growth in children.  Over the last few years, many studies have suggested an association between low vitamin D levels and increased risk of certain types of cancers, neurologic disease, autoimmune disease and cardiovascular disease.  While dermatologists agree that the sun is certainly a source of vitamin D, we are also uniquely aware  it is also a source of harmful ultraviolet radiation resulting in thousands of skin cancers each year.  The official recommendation of the American Academy of Dermatology (AAD) is that vitamin D should be obtained through dietary sources and supplementation rather than from sunlight (ultraviolet radiation).    MOLES AND MELANOMAS      Moles (nevi) are tan or brown, raised or flat areas of the skin which have an increased number of melanocytes. Melanocytes are the cells in our body which make pigment and account for skin color.    Some moles are present at birth (congenital nevi), while others come up later in life (acquired nevi).  The sun can stimulate the body to make more moles.  Sunburns are not the only thing that triggers more moles.  Chronic sun exposure can do it too.     Malignant melanoma is a type of skin cancer that can be deadly if it spreads throughout the body. The incidence of melanoma in the [place] is growing  rapidly.  Melanoma usually grows near the surface of the skin for a period of time, and then begins to grow deeper into the skin. Once it grows deeper into the skin, the risk of spread to other organs greatly increases. Therefore, early detection and removal of a malignant melanoma can result in a complete cure; removal after the tumor has spread may not be effective.    Melanoma can often be suspected by its appearance. The ABCDE's of melanoma are:    Asymmetry: Asymmetry means if you draw a line through the mole, the two halves do not match in color, size, shape, or surface texture. Asymmetry can be a result of rapid enlargement of a mole, the development of a raised area on a previously flat lesion, scaling, ulceration, bleeding or scabbing within the mole.    Border: The border of a melanoma often blends into the normal skin and does not sharply delineate the mole from normal skin.    Color: Different colors within a mole, or the development of dark black, blue, or red areas in a preexisting mole.    Diameter: Size greater than 0.6cm (1/4 of an inch, or the size of a pencil eraser). This is only a guideline, and many normal moles may be this large or even a bit larger.    Evolving: Any change -- in size, shape, color, elevation, or another trait, or any new symptom such as bleeding, itching or crusting    Melanomas should be considered when a mole suddenly appears or changes. Itching, burning, or pain in a pigmented lesion should cause suspicion, but most patients with early melanoma have no skin discomfort whatsoever.  The appearance of a mole remains the most reliable method for identifying a malignant melanoma. Suspicious-looking moles may be removed for microscopic examination.    Melanoma can occur anywhere on the skin, including areas that are difficult for self-examination. Many melanomas are first noticed by other family members.      Occasionally, melanomas appear as rapidly growing, blue-black,  dome-shaped bumps within a previous mole or previous area of normal skin    Dysplastic moles are moles that fit the ABCDE rules of melanoma, but are not melanomas when examined under the microscope.  They may indicate an increased risk of melanoma in that person. If there is a family history of melanoma, most experts agree that the person is at an increased risk for developing a melanoma.  Experts still do not agree on  what dysplastic moles mean in patients without a personal or family history of melanoma.  Dysplastic moles are usually larger than common moles and may have different colors within them with irregular borders. The appearance can be very similar to a melanoma. Biopsies of dysplastic moles may show abnormalities which are different from a regular mole.      You should take steps to decrease you risk of melanoma.   First, avoid the sun and protect your skin when it is exposed to the sun.  People who have lived near the equator, people who have intermittent exposures to large amounts of sun, and people who have had sunburns in childhood or adolescence have an increased risk for melanoma. Sun sense and sun protection are key to preventing melanoma.    Secondly, moles should be looked at regularly.  Melanomas can be diagnosed early by self-examinations at home.  By looking for the ABCDE's of moles once a month, you should notice if any changes have occurred and if so, you should make an appointment earlier than your next check-up in order to be examined.  On the other hand, don’t check your moles more than once a month or you might not notice a change.

## 2017-11-07 ENCOUNTER — Telehealth (INDEPENDENT_AMBULATORY_CARE_PROVIDER_SITE_OTHER): Payer: Self-pay | Admitting: Internal Medicine

## 2017-11-07 ENCOUNTER — Other Ambulatory Visit (INDEPENDENT_AMBULATORY_CARE_PROVIDER_SITE_OTHER): Payer: Self-pay | Admitting: Internal Medicine

## 2017-11-07 DIAGNOSIS — E039 Hypothyroidism, unspecified: Secondary | ICD-10-CM

## 2017-11-07 MED ORDER — LEVOTHYROXINE SODIUM 112 MCG OR TABS
112.0000 ug | ORAL_TABLET | ORAL | 0 refills | Status: DC
Start: 2017-11-07 — End: 2018-02-02

## 2017-11-07 MED ORDER — LEVOTHYROXINE SODIUM 125 MCG OR TABS
125.00 ug | ORAL_TABLET | ORAL | 0 refills | Status: DC
Start: 2017-11-07 — End: 2018-02-02

## 2017-11-07 NOTE — Telephone Encounter (Signed)
Who is calling: Incoming call from patient  Insurance Coverage Verified: Active- in network     Reason for this call: Pt is calling to reschedule the appt she had with Dr Laurance Flatten for 11/12/17 stating nurse contact pt today to cancel. Pt would like to see Dr Laurance Flatten as her new PCP. Per protocol Dr Laurance Flatten is close panel so no new pt appt are available. Pt stated that she has schedule appt with Dr Laurance Flatten since May for 11/10/17 was reschedule and now she is being reschedule again. Pt would like to speak with nurse for appt accommodation with Dr Laurance Flatten.     Action required by office: Please contact caller    Duplicate encounter? No previous documentation found on this issue.     Best way to contact: [phone]      Inquiry has been read verbatim to this caller. Verbalizes satisfaction and confirms the above is accurate: yes      Has been advised this message will be transmitted to office and can expect a response within the next 24-72 hours.

## 2017-11-07 NOTE — Telephone Encounter (Signed)
Patient is requesting refill of medication, please see orders for preloaded prescription refill.    Requested Prescriptions     Pending Prescriptions Disp Refills    levothyroxine (SYNTHROID) 112 MCG tablet 45 tablet 0     Sig: Take 1 tablet (112 mcg) by mouth every other day. Alternating with 125 mcg tablet every other day    levothyroxine (SYNTHROID) 125 MCG tablet 45 tablet 0     Sig: Take 1 tablet (125 mcg) by mouth every other day. Alternating with 112 mcg tablet every other day.       LOV:   08/21/2017  NOV:   11/24/2017 with Dr Laurance Flatten  Last RX fill: 08/20/2017    Last Thyroid Labs:  Lab Results   Component Value Date    TSH 1.12 08/20/2017    FREET4 1.82 (H) 11/25/2014

## 2017-11-07 NOTE — Telephone Encounter (Signed)
Spoke to patient appointment has been re-scheduled to Monday 11/24/17.  Patient agreed to date and time of appointment.  Matter solved

## 2017-11-07 NOTE — Telephone Encounter (Signed)
Patient already has an appt to see Dr Laurance Flatten on 11/12/2017 but was bumped. Routed to [facility] to make an appt.  Thank you.

## 2017-11-08 ENCOUNTER — Encounter (INDEPENDENT_AMBULATORY_CARE_PROVIDER_SITE_OTHER): Payer: Self-pay | Admitting: Dermatology

## 2017-11-10 ENCOUNTER — Encounter

## 2017-11-10 ENCOUNTER — Ambulatory Visit (INDEPENDENT_AMBULATORY_CARE_PROVIDER_SITE_OTHER): Payer: BLUE CROSS/BLUE SHIELD | Admitting: Internal Medicine

## 2017-11-10 NOTE — Telephone Encounter (Signed)
From: [name]  To: Marguerite Olea, MD  Sent: 11/08/2017 1:44 PM PDT  Subject: 20-Other    Hi Dr. Jerene Dilling,  Following up on my family history of skin cancer. My father had a melanoma on his arm. My mother's instances on her nose were either basal cell or squamous.  Best,  Brittany [name]

## 2017-11-12 ENCOUNTER — Ambulatory Visit (INDEPENDENT_AMBULATORY_CARE_PROVIDER_SITE_OTHER): Payer: BLUE CROSS/BLUE SHIELD | Admitting: Internal Medicine

## 2017-11-12 ENCOUNTER — Other Ambulatory Visit: Payer: Self-pay | Admitting: Obstetrics & Gynecology

## 2017-11-12 DIAGNOSIS — Z1239 Encounter for other screening for malignant neoplasm of breast: Secondary | ICD-10-CM

## 2017-11-23 ENCOUNTER — Other Ambulatory Visit: Payer: Self-pay

## 2017-11-24 ENCOUNTER — Ambulatory Visit (INDEPENDENT_AMBULATORY_CARE_PROVIDER_SITE_OTHER): Payer: BLUE CROSS/BLUE SHIELD | Admitting: Internal Medicine

## 2017-11-24 ENCOUNTER — Encounter (INDEPENDENT_AMBULATORY_CARE_PROVIDER_SITE_OTHER): Payer: Self-pay | Admitting: Internal Medicine

## 2017-11-24 VITALS — BP 100/64 | HR 92 | Temp 98.5°F | Resp 16 | Ht 69.0 in | Wt 186.0 lb

## 2017-11-24 DIAGNOSIS — L309 Dermatitis, unspecified: Secondary | ICD-10-CM

## 2017-11-24 DIAGNOSIS — M654 Radial styloid tenosynovitis [de Quervain]: Secondary | ICD-10-CM

## 2017-11-24 DIAGNOSIS — E7800 Pure hypercholesterolemia, unspecified: Secondary | ICD-10-CM

## 2017-11-24 DIAGNOSIS — Z0000 Encounter for general adult medical examination without abnormal findings: Secondary | ICD-10-CM

## 2017-11-24 DIAGNOSIS — Z7689 Persons encountering health services in other specified circumstances: Principal | ICD-10-CM

## 2017-11-24 MED ORDER — DICLOFENAC SODIUM 1 % EX GEL
2.0000 g | Freq: Four times a day (QID) | TRANSDERMAL | 0 refills | Status: DC
Start: 2017-11-24 — End: 2018-02-05

## 2017-11-24 MED ORDER — TRIAMCINOLONE ACETONIDE 0.1 % EX OINT
TOPICAL_OINTMENT | CUTANEOUS | 0 refills | Status: DC
Start: 2017-11-24 — End: 2019-02-13

## 2017-11-24 NOTE — Progress Notes (Signed)
Chief Complaint   Patient presents with    Brittany [name] is a 52 year old female here to reestablish care and address the following:    #1: Mammogram due and breast exam today for completeness    #2: Pap next 2022    #3: Shingrix due and first check with pharmacy    #4: Labs in 4/19 BS normal. No A1c. Lipids higher. Working at it and plan re: check lipids and HGa1c.    #5: Colonoscopy declined and encouraged. No FH. Will continue to consider.    #6: Want trial topical diclofenac with radial styloid teno vs OA 1st MCP    #7: Want to include Hga1c and will order as at risk    #8: Will reorder TAC for dermatitis non specific    Patient Active Problem List    Diagnosis Date Noted    Chronic midline low back pain without sciatica 10/17/2015    Hyperlipidemia, unspecified hyperlipidemia type 09/08/2015    Impingement syndrome, shoulder, right 05/02/2015    Radial styloid tenosynovitis 05/02/2015    Preventative health care 11/28/2014    Allergic rhinitis due to pollen 04/11/2014    Gastroesophageal reflux disease without esophagitis 04/11/2014    Bilateral low back pain without sciatica 09/21/2013    Tension headache 12/26/2011    Gluten intolerance 09/17/2011    Dyspepsia 07/15/2011    Intestinal gas excretion 07/15/2011    Abdominal pain 03/13/2011    Right knee pain 03/13/2011    Routine lab draw 03/13/2011    Hearing loss 12/14/2010    Cerumen impaction 12/14/2010    Pre-operative examination 12/06/2010     Brittany [name] is a 52 year old female G0P0000 with h/o menorrhagia who presents today for preop visit for hysteroscopy, D&C, possible resection of fibroid, and Novasure ablation.     Work-up:  11/22/10: EMB benign  11/22/10 pap: negative    11/28/10 Pelvic sono:  The uterus shows several intramural fibroids, largest measuring 3.7 x 3.2 cm   containing a subcentimeter cystic degeneration, abutting the endometrium. The  endometrial stripe is 1.8-mm. Both  ovaries are normal measuring 2.6 x 1.3 x   1.7 cm on the right and 2.6 x 2.7 x 2.0 cm on the left. No free fluid is   seen        Skin lesion 12/05/2010    Palpitations 10/02/2010    Low back pain 06/15/2010    Fatigue 06/15/2010    Depression 07/25/2009    Sebaceous cyst 03/23/2009    Insomnia 11/16/2008    Acquired hypothyroidism 09/22/2008    Right foot pain 08/18/2008    Dysmenorrhea 08/18/2008    Grave's disease 01/15/2008    Vitamin D deficiency 01/06/2008    Benign neoplasm of skin, site unspecified 08/27/2007    Cold sensitivity 08/27/2007    Health maintenance examination 02/04/2007     Last pap 2006, has had squamous cells, repeats normal  Mammogram: 2/08, all normal  CHOL 225  HDL65  LDLCALC      143    TRIG 87  (12/15/06)  Cycles: regular q28 days, last two cycles "heavier flow" "more cramping" and 1day longer (5 instead of 4)         Infectious mononucleosis 09/11/2006       Past medical history, surgical history, and family history reviewed and updated today as below.  Allergies and medications reviewed and updated as below.    Past  Medical History:   Diagnosis Date    Hypothyroidism     h/o hyperthyroidism s/p radioactive iodine    Infectious mononucleosis 08/2006    Insomnia     Menorrhagia        Past Surgical History:   Procedure Laterality Date    CHOLECYSTECTOMY, LAP  1995    left wrist tendon repair  1998    PB MYOMECTOMY 1-4 MYOMAS 250 GM/< VAGINAL APPR      PB REMOVE TONSILS/ADENOIDS,<12 Y/O  1975    wisdom teeth         OB History   Gravida Para Term Preterm AB Living   0 0 0 0 0 0   SAB TAB Ectopic Multiple Live Births   0 0 0 0 0       Family History   Problem Relation Name Age of Onset    Breast Cancer Mother  51    Other Sister          Uterine fibroids/hysterectomy    Crohn's Disease Sister      Breast Cancer Sister  24    Ovarian Cancer Neg Hx          but sister w/ovarian cysts age 33       Social History:  Social History     Tobacco Use    Smoking status:  Never Smoker    Smokeless tobacco: Never Used   Substance Use Topics    Alcohol use: Yes     Comment: 1 bottle wine /week    Drug use: No     Social History     Social History Narrative    2019- Divorced and no kids. 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 Yoga andswimming/hiking     Allergies:  Allergies   Allergen Reactions    Vicodin [Hydrocodone-Acetaminophen] Hallucinations       Current Outpatient Medications   Medication Sig    cetirizine (ZYRTEC) 10 MG tablet Take 10 mg by mouth daily.    clotrimazole (LOTRIMIN) 1 % cream Apply 1 Application topically daily. Apply to affected area daily.    cyclobenzaprine (FLEXERIL) 5 MG tablet Take 1 tablet (5 mg) by mouth 3 times daily as needed for Muscle Spasms.    diclofenac (VOLTAREN) 1 % gel Apply 2 g topically 4 times daily.    fluticasone propionate (FLONASE) 50 MCG/ACT nasal spray Spray 1 spray into each nostril 2 times daily.    ibuprofen (MOTRIN) 800 MG tablet Take 1 tablet (800 mg) by mouth every 8 hours as needed for Mild Pain (Pain Score 1-3) or Moderate Pain (Pain Score 4-6).    levonorgestrel-ethinyl estradiol (AVIANE) 0.1-20 MG-MCG tablet Take 1 tablet by mouth daily.    levothyroxine (SYNTHROID) 112 MCG tablet Take 1 tablet (112 mcg) by mouth every other day. Alternating with 125 mcg tablet every other day    levothyroxine (SYNTHROID) 125 MCG tablet Take 1 tablet (125 mcg) by mouth every other day. Alternating with 112 mcg tablet every other day.    metroNIDAZOLE (METROGEL) 1 % gel Apply 1 Application topically daily. Apply to affected area daily.    tretinoin (RETIN-A) 0.025 % cream Apply a thin layer at bedtime as directed    triamcinolone (KENALOG) 0.1 % ointment Apply a thin layer as directed     No current facility-administered medications for this visit.        Immunizations reviewed and look into shingrix, please see immunizations in EPIC.  ROS:    Constitutional: negative for fever,  chills, weight loss or weight gain  CV: negative for chest pain, palpitations, LEE  Respiratory: negative for SOB, DOE, PND, orthopnea  GI: negative for nausea, vomiting, abdominal pain      OBJECTIVE:: BP 100/64 (BP Location: Left arm, BP Patient Position: Sitting, BP cuff size: Large)    Pulse 92    Temp 98.5 F (36.9 C) (Oral)    Resp 16    Ht 5\' 9"  (1.753 m)    Wt 84.4 kg (186 lb)    LMP 11/07/2017 (Exact Date)    SpO2 98%    BMI 27.47 kg/m     Physical Exam:   Gen:N/C, A/T and NAD  HEAD AND NECK:  Ears normal.  Throat, oral cavity and  tongue normal.  Neck supple. No adenopathy or masses in the neck or  supraclavicular regions. No thyromegaly.  CHEST:  Clear, good air entry, no wheezes, rhonchi or  rales.   HEART:  S1 and S2 normal, no murmurs, clicks, gallops or  rubs.  Regular rate and rhythm.  No edema   ABDOMEN:  Soft without tenderness, guarding, mass or organomegaly.  No CVA tenderness or inguinal adenopathy.   EXTREMITIES: without edema     Breast Exam: breasts symmetric, no dominant or suspicious mass, no skin or nipple changes and no axillary adenopathy.    I have reviewed the most recent lab and imaging data.    Results for Brittany [name], Brittany [name] (MRN [id number]) as of 11/26/2017 16:35   Ref. Range 08/20/2017 08:30   Sodium Latest Ref Range: 136 - 145 mmol/L 141   Potassium Latest Ref Range: 3.5 - 5.1 mmol/L 4.2   Chloride Latest Ref Range: 98 - 107 mmol/L 104   Bicarbonate Latest Ref Range: 22 - 29 mmol/L 26   Anion Gap Latest Ref Range: 7 - 15 mmol/L 11   BUN Latest Ref Range: 6 - 20 mg/dL 13   Creatinine Latest Ref Range: 0.51 - 0.95 mg/dL 0.82   GFR Latest Units: mL/min >60   Glucose Latest Ref Range: 70 - 99 mg/dL 94   Calcium Latest Ref Range: 8.5 - 10.6 mg/dL 9.6   Alkaline Phos Latest Ref Range: 35 - 140 U/L 41   ALT (SGPT) Latest Ref Range: 0 - 33 U/L 24   AST (SGOT) Latest Ref Range: 0 - 32 U/L 23   Bilirubin, Tot Latest Ref Range: <1.2 mg/dL 2.01 (H)   Albumin Latest Ref Range: 3.5 - 5.2 g/dL 4.3    Total Protein Latest Ref Range: 6.0 - 8.0 g/dL 6.6         A/P:  Lasasha was seen today for establish care.    Generally encouraged increased physical activity and dietary changes    Diagnoses and all orders for this visit:    Establishing care with new doctor, encounter for    Radial styloid tenosynovitis  -     diclofenac (VOLTAREN) 1 % gel; Apply 2 g topically 4 times daily.    Dermatitis  -     triamcinolone (KENALOG) 0.1 % ointment; Apply a thin layer as directed    Elevated cholesterol  -     Lipid Panel Green Plasma Separator Tube; Future    Laboratory examination ordered as part of a routine general medical examination  -     Glycosylated Hgb(A1C), Blood Lavender; Future          Patient instructed on: Vitamin D 1000 iu per  day     calcium intake, diet, exercise    Follow-up after additional labs above  Patient Instruction:  See Patient Education/Instruction section.      Barriers to Learning assessed: none. Patient verbalizes understanding of teaching and instructions and is agreeable to above plan.    LVN pls call pt and clarify where is TAC being used for specific sx

## 2017-11-24 NOTE — Patient Instructions (Addendum)
1) 2 months Fasting lipids and Hga1c -at risk    2) Breast exam today. Mammogram scheduled    3) Vit D 1000 iu per day    4) Shingrix check with pharm    5) Consider colonoscopy    6) F/u 3 months after labs    7) LVN to call and confirm where rash/dermatits is?

## 2017-11-25 ENCOUNTER — Ambulatory Visit
Admission: RE | Admit: 2017-11-25 | Discharge: 2017-11-25 | Disposition: A | Discharge status: Home / Self Care | Payer: BLUE CROSS/BLUE SHIELD | Attending: Neuroradiology | Admitting: Neuroradiology

## 2017-11-25 DIAGNOSIS — Z1231 Encounter for screening mammogram for malignant neoplasm of breast: Secondary | ICD-10-CM | POA: Insufficient documentation

## 2017-11-25 DIAGNOSIS — Z1239 Encounter for other screening for malignant neoplasm of breast: Secondary | ICD-10-CM

## 2017-11-26 ENCOUNTER — Encounter (INDEPENDENT_AMBULATORY_CARE_PROVIDER_SITE_OTHER): Payer: Self-pay | Admitting: Internal Medicine

## 2017-11-27 ENCOUNTER — Encounter (INDEPENDENT_AMBULATORY_CARE_PROVIDER_SITE_OTHER): Payer: Self-pay

## 2017-11-27 ENCOUNTER — Telehealth (INDEPENDENT_AMBULATORY_CARE_PROVIDER_SITE_OTHER): Payer: Self-pay | Admitting: Internal Medicine

## 2017-11-27 NOTE — Telephone Encounter (Signed)
Message from Dr Laurance Flatten;  Pls call pt and confirm where "rash" is or sx for steroid topical??     Attempted to call the pt. by phone with the following result;  left message on machine to call back and or read my chart message.    Message sent.

## 2017-11-28 NOTE — Telephone Encounter (Signed)
Routed to Dr Laurance Flatten.    Hi Liz,  I showed Dr. Laurance Flatten - on the back of both legs from the knees to the thighs, also some on calves and wrists.   Dyann Ruddle

## 2017-12-01 ENCOUNTER — Emergency Department (INDEPENDENT_AMBULATORY_CARE_PROVIDER_SITE_OTHER): Payer: BLUE CROSS/BLUE SHIELD

## 2017-12-01 ENCOUNTER — Other Ambulatory Visit (INDEPENDENT_AMBULATORY_CARE_PROVIDER_SITE_OTHER): Payer: BLUE CROSS/BLUE SHIELD

## 2017-12-01 ENCOUNTER — Encounter (INDEPENDENT_AMBULATORY_CARE_PROVIDER_SITE_OTHER): Payer: Self-pay

## 2017-12-01 ENCOUNTER — Other Ambulatory Visit (INDEPENDENT_AMBULATORY_CARE_PROVIDER_SITE_OTHER): Payer: Self-pay | Admitting: Nurse Practitioner

## 2017-12-01 VITALS — BP 114/60 | HR 88 | Temp 98.4°F | Resp 18

## 2017-12-01 DIAGNOSIS — M5032 Other cervical disc degeneration, mid-cervical region, unspecified level: Secondary | ICD-10-CM

## 2017-12-01 DIAGNOSIS — M542 Cervicalgia: Principal | ICD-10-CM

## 2017-12-01 DIAGNOSIS — M2578 Osteophyte, vertebrae: Secondary | ICD-10-CM

## 2017-12-01 DIAGNOSIS — M47892 Other spondylosis, cervical region: Secondary | ICD-10-CM

## 2017-12-01 DIAGNOSIS — M4802 Spinal stenosis, cervical region: Secondary | ICD-10-CM

## 2017-12-01 NOTE — Interdisciplinary (Signed)
2 pt ID verified.   Pt. Oriented to room  Aware of wait time  Comfort measures offered

## 2017-12-01 NOTE — Patient Instructions (Signed)
Back Pain, Cervical NOS    You have been seen for neck pain. This area is also called the cervical spine.    The cervical spine is between the base of the skull and the top of the shoulders.    There are many different reasons for neck pain. Some of the more common include: Bone pain, muscle strain, muscle spasm, pain from overuse, and pinched nerves.     The x-rays of your neck showed no broken bones.    The doctor still does not know the exact cause of your pain. Your problem does not seem to be from a dangerous cause. It is OK for you to go home today.    Some things you can try to help your neck feel better are:   Apply a warm damp washcloth to the neck for 20 minutes at a time, at least 4 times per day. This will reduce your pain. Massaging your neck might also help.   Have someone massage the sore parts of your neck.   Don't do any heavy lifting or bending. You can go back to normal daily activities if they don't make the pain worse.   Use the over-the-counter anti-inflammatory medication ibuprofen (also known as Advil or Motrin) as directed on the package to help with pain and inflammation.    It is normal for the pain to last for the next few days.     Call your doctor or go to the nearest Emergency Department if you your pain does not improve or your pain is bad enough to seriously limit your normal activities.    YOU SHOULD SEEK MEDICAL ATTENTION IMMEDIATELY, EITHER HERE OR AT THE NEAREST EMERGENCY DEPARTMENT, IF ANY OF THE FOLLOWING OCCURS:   You think the pain is coming from somewhere other than your back. This can include chest pain. This is sometimes from angina (heart pains) or other dangerous causes.   You have shortness of breath, sweating, chest pain (or pressure, heaviness, indigestion, etc).   Your arms and legs tingle or get numb (lose feeling).   Your arms or legs are weak.   You have fever (temperature higher than 100.4F / 38C) along with headache and neck pain.   Your  neck pain is getting worse.   You lose control of your bladder or bowels. If this were to happen, it may cause you to wet or soil yourself.   You have problems urinating (peeing).   Your symptoms get worse or you have new symptoms or concerns.    If you can't follow up with your doctor, or if at any time you feel you need to be rechecked or seen again, come back here or go to the nearest emergency department.

## 2017-12-01 NOTE — Interdisciplinary (Signed)
Patient Discharge/Education  AVS printed/explained to pt: Yes  Learner: pt  Pain addressed: yes  Method: Explanation and Handout  Treatment education given: Yes  Response: Verbalizes understanding  Stable upon discharge: Yes; ambulatory to exit   Pleased w/ service and informed of delay: Yes
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
## 2017-12-02 ENCOUNTER — Other Ambulatory Visit (INDEPENDENT_AMBULATORY_CARE_PROVIDER_SITE_OTHER): Payer: Self-pay | Admitting: Internal Medicine

## 2017-12-02 DIAGNOSIS — M545 Low back pain, unspecified: Secondary | ICD-10-CM

## 2017-12-02 DIAGNOSIS — G8929 Other chronic pain: Principal | ICD-10-CM

## 2017-12-02 MED ORDER — CYCLOBENZAPRINE HCL 5 MG OR TABS
5.0000 mg | ORAL_TABLET | Freq: Three times a day (TID) | ORAL | 0 refills | Status: DC | PRN
Start: 2017-12-02 — End: 2018-08-05

## 2017-12-02 NOTE — Telephone Encounter (Signed)
Patient is requesting refill of medication, please see orders for preloaded prescription refill.    Requested Prescriptions     Pending Prescriptions Disp Refills    cyclobenzaprine (FLEXERIL) 5 MG tablet 30 tablet 1     Sig: Take 1 tablet (5 mg) by mouth 3 times daily as needed for Muscle Spasms.       LOV: 11/24/2017  NOV: 12/03/2017 with Dr Eugenio Hoes.  Last RX fill: 06/25/2017

## 2017-12-03 ENCOUNTER — Ambulatory Visit (INDEPENDENT_AMBULATORY_CARE_PROVIDER_SITE_OTHER): Payer: BLUE CROSS/BLUE SHIELD | Admitting: Internal Medicine

## 2017-12-03 ENCOUNTER — Encounter (INDEPENDENT_AMBULATORY_CARE_PROVIDER_SITE_OTHER): Payer: Self-pay | Admitting: Internal Medicine

## 2017-12-03 VITALS — BP 100/66 | HR 75 | Temp 98.2°F | Resp 16 | Wt 191.0 lb

## 2017-12-03 DIAGNOSIS — S161XXA Strain of muscle, fascia and tendon at neck level, initial encounter: Principal | ICD-10-CM

## 2017-12-03 DIAGNOSIS — M5030 Other cervical disc degeneration, unspecified cervical region: Secondary | ICD-10-CM

## 2017-12-03 DIAGNOSIS — E039 Hypothyroidism, unspecified: Secondary | ICD-10-CM

## 2017-12-03 DIAGNOSIS — E785 Hyperlipidemia, unspecified: Secondary | ICD-10-CM

## 2017-12-07 ENCOUNTER — Encounter (INDEPENDENT_AMBULATORY_CARE_PROVIDER_SITE_OTHER): Payer: Self-pay | Admitting: Internal Medicine
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
## 2017-12-11 ENCOUNTER — Encounter (INDEPENDENT_AMBULATORY_CARE_PROVIDER_SITE_OTHER): Payer: Self-pay | Admitting: Dermatology

## 2017-12-11 NOTE — Telephone Encounter (Signed)
From: [name]  To: Marguerite Olea, MD  Sent: 12/11/2017 10:17 AM PDT  Subject: 1-Non Urgent Medical Advice    Hello Dr. Jerene Dilling,  The red spot on my forehead still hasn't gone away, and I have several more. See attached photo. They peel every day. Is this eczema?   My only other Link Snuffer is that it gets worse after I wear my sunhat, so maybe it is an allergic reaction to the laundry detergent I used to wash the hat.  Thank you,  [name]

## 2017-12-12 ENCOUNTER — Other Ambulatory Visit (INDEPENDENT_AMBULATORY_CARE_PROVIDER_SITE_OTHER): Payer: Self-pay | Admitting: Obstetrics & Gynecology

## 2017-12-12 DIAGNOSIS — N920 Excessive and frequent menstruation with regular cycle: Secondary | ICD-10-CM

## 2017-12-15 NOTE — Telephone Encounter (Signed)
Called pharmacy and left message that pt should have refills. If they have any questions I provided our phone number to call us back.

## 2018-01-01 ENCOUNTER — Other Ambulatory Visit (INDEPENDENT_AMBULATORY_CARE_PROVIDER_SITE_OTHER): Payer: Self-pay | Admitting: Internal Medicine

## 2018-01-01 ENCOUNTER — Other Ambulatory Visit (INDEPENDENT_AMBULATORY_CARE_PROVIDER_SITE_OTHER): Payer: BLUE CROSS/BLUE SHIELD

## 2018-01-01 ENCOUNTER — Emergency Department (INDEPENDENT_AMBULATORY_CARE_PROVIDER_SITE_OTHER): Payer: BLUE CROSS/BLUE SHIELD | Admitting: Physician Assistant

## 2018-01-01 ENCOUNTER — Encounter (INDEPENDENT_AMBULATORY_CARE_PROVIDER_SITE_OTHER): Payer: Self-pay | Admitting: Internal Medicine

## 2018-01-01 ENCOUNTER — Encounter (INDEPENDENT_AMBULATORY_CARE_PROVIDER_SITE_OTHER): Payer: Self-pay

## 2018-01-01 VITALS — BP 113/73 | HR 80 | Temp 98.1°F | Resp 18

## 2018-01-01 DIAGNOSIS — M549 Dorsalgia, unspecified: Principal | ICD-10-CM

## 2018-01-01 DIAGNOSIS — M1711 Unilateral primary osteoarthritis, right knee: Secondary | ICD-10-CM

## 2018-01-01 DIAGNOSIS — S8991XA Unspecified injury of right lower leg, initial encounter: Principal | ICD-10-CM

## 2018-01-01 MED ORDER — IBUPROFEN 800 MG OR TABS
800.0000 mg | ORAL_TABLET | Freq: Three times a day (TID) | ORAL | 1 refills | Status: DC | PRN
Start: 2018-01-01 — End: 2018-08-23

## 2018-01-01 NOTE — Progress Notes (Signed)
Chief Complaint:   Chief Complaint   Patient presents with   . Knee Injury     felt pop in right knee x 1 day; walking up stairs       Case summary is as follows: [name] is a 52 year old female with pmh hypothyroid  who presents with right knee pain. Pt states she felt a popping sensation and pain in right lateral knee while walking up stairs yesterday. Pt states pain worse with ambulation. Pt has been wearing a brace with improvement of symptoms. Denies numbness, tingling, weakness.     Past Medical History:   Diagnosis Date   . Hypothyroidism     h/o hyperthyroidism s/p radioactive iodine   . Infectious mononucleosis 08/2006   . Insomnia    . Menorrhagia      Past Surgical History:   Procedure Laterality Date   . CHOLECYSTECTOMY, LAP  1995   . left wrist tendon repair  1998   . PB MYOMECTOMY 1-4 MYOMAS 250 GM/< VAGINAL APPR     . PB REMOVE TONSILS/ADENOIDS,<12 Y/O  1975   . wisdom teeth       Social History     Socioeconomic History   . Marital status: Divorced     Spouse name: Not on file   . Number of children: Not on file   . Years of education: Not on file   . Highest education level: Not on file   Occupational History   . Occupation: Office manager   Social Needs   . Financial resource strain: Not on file   . Food insecurity:     Worry: Not on file     Inability: Not on file   . Transportation needs:     Medical: Not on file     Non-medical: Not on file   Tobacco Use   . Smoking status: Never Smoker   . Smokeless tobacco: Never Used   Substance and Sexual Activity   . Alcohol use: Yes     Comment: 1 bottle wine /week   . Drug use: No   . Sexual activity: Not Currently   Lifestyle   . Physical activity:     Days per week: Not on file     Minutes per session: Not on file   . Stress: Not on file   Relationships   . Social connections:     Talks on phone: Not on file     Gets together: Not on file     Attends religious service: Not on file     Active member of club or organization: Not on file      Attends meetings of clubs or organizations: Not on file     Relationship status: Not on file   . Intimate partner violence:     Fear of current or ex partner: Not on file     Emotionally abused: Not on file     Physically abused: Not on file     Forced sexual activity: Not on file   Other Topics Concern   . Not on file   Social History Narrative    2019- Divorced and no kids. 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 Lives on own and social theatre/culture. Yoga andswimming/hiking     Family History   Problem Relation Name Age of Onset   . Breast Cancer Mother  6   . Other Sister          Uterine fibroids/hysterectomy   .  Crohn's Disease Sister     . Breast Cancer Sister  92   . Ovarian Cancer Neg Hx          but sister w/ovarian cysts age 28     Medication Review Moment  Cannot display prior to admission medications because the patient has not been admitted in this contact.       Review of Systems   Constitutional: Negative for fever   Musculoskeletal: +right knee pain, negative for hip or ankle pain  Neurological: Negative for numbness, tingling, weakness  Skin: negative for rash    Physical Examination:    01/01/18  0822   BP: 113/73   Pulse: 80   Resp: 18   Temp: 98.1 F (36.7 C)   SpO2: 98%     Vital Signs noted from Triage Page.  Brittany [name] is a well developed well nourished female in no apparent distress. she is alert and oriented x3.   Extremity exam shows FROM right knee, FROM right hip and right ankle, no reproducible tenderness to right knee, no joint laxity, negative ant/post drawer, neg varus/valgus stress, 2+ DP and PT pulse, nv intact  Neuro exam with grossly nonfocal motor/sensory examination. Stable gait.   Skin exam shows no lesions or rashes.    Impression: 52 yo female with right knee pain. Pt felt popping sensation while walking up stairs. Likely strain, sprain, meniscus injury. Also consider avulsion. Xray shows no fracture. Pt to continue use of brace. Requesting crutches.  Referral for MRI placed if continued pain.       ICD-10-CM ICD-9-CM    1. Injury of right knee, initial encounter S89.91XA 959.7 X-Ray Knee 3 Views - Right      MRI Lower Extremity Joint W/O Contrast - Right      Crutches     Plan  Knee brace, crutches. F/u PCP. MRI referral. Strict return precautions.      Return precautions given  Patient to arrange follow up  Patient comfortable with plan    Case discussed with Dr. Ashok Norris, PA

## 2018-01-01 NOTE — Interdisciplinary (Signed)
Two patient identifiers verified. Pt intake complete. Pt aware of wait time, comfort measures offered.

## 2018-01-01 NOTE — Patient Instructions (Addendum)
A referral for an MRI has been placed if you continue to have knee pain.     You may continue to use your brace and use crutches as needed.     Follow up with your doctor in one week for recheck.     Knee Injury, General    You have been seen for a knee injury.    There are a few different kinds of injuries that can happen with the knee. They can be seen separately (individually) or can happen together.    Ligament Injuries:    A sprain is an injury to a ligament (a type of connective tissue). It is usually a tear or partial tear. Sprains can be as painful as broken bones.   There are different degrees of injury. A first-degree sprain is a minor tear. With a third-degree sprain, there is often a chip in the bone torn away where the ligament attaches.    It can be hard to tell which ligament is injured. This is because of the swelling and pain that happen with some knee injuries.   Sprains are treated with medicine to lower pain and splints to limit movement. They are also treated with Rest, Ice, Compressing and Elevating the injured area (RICE).    Cartilage Injuries:   Cartilage is the soft, flexible tissue in the body. The knee has a lot of cartilage. It can be damaged from injury or over-use.    Once the cartilage is damaged, surgery may be needed. This is done to take out the injured pieces. A bone doctor decides if a knee injury needs surgery. This doctor is called an orthopedist.   An injury damaging the knee's cartilage often also damages the soft tissues (ligaments) of the knee.     Treatment for a knee injury includes:   REST: Limit the use of the injured body part.     ICE: By applying ice to the affected area, swelling and pain can be reduced. Place some ice cubes in a re-sealable (Ziploc) bag and add some water. Put a thin washcloth between the bag and the skin. Apply the ice bag to the area for at least 20 minutes. Do this at least 4 times per day. It is okay to do this more often than  directed. You can also do it for longer than directed. NEVER APPLY ICE DIRECTLY TO THE SKIN.     COMPRESS: Compression means to apply pressure around the injured area such as with a splint, cast or an ACE bandage. Compression lowers swelling and makes the area more comfortable. Compression should be tight enough to relieve swelling but not so tight it lowers circulation. Increasing pain, numbness, tingling, or change in skin color are all signs of decreased circulation.      ELEVATE: Elevate the injured part. You can prop an injured leg on a chair while sitting, or on pillows while lying down.    You may take off the splint when you sleep and bathe. You can also take it off during the day when you are not walking. Bend your knee a few times to help with stiffness when you remove the splint.    YOU SHOULD SEEK MEDICAL ATTENTION IMMEDIATELY, EITHER HERE OR AT THE NEAREST EMERGENCY DEPARTMENT, IF ANY OF THE FOLLOWING OCCUR:     You have a severe increase in pain.   You have new numbness or tingling in or below the affected area.   Your leg or foot is cold  or pale. This could mean it is having problems with its blood supply.   You have a fever (temperature higher than 100.23F or 38C) or chills.   Your knee gets more red or feels warm.    If you can't follow up with your doctor, or if at any time you feel you need to be rechecked or seen again, come back here or go to the nearest emergency department.

## 2018-01-01 NOTE — Interdisciplinary (Signed)
I have given crutches to the patient, adjusted them and provided complete instructions on safe use.  Patient Discharge/Education  AVS printed/explained to pt: Yes  Learner: patient  Pain addressed: yes  Method: Explanation and Handout  Treatment education given: Yes  Response: Verbalizes understanding  Stable upon discharge: Yes; stable to exit   Pleased w/ service and informed of delay: Yes

## 2018-01-01 NOTE — Telephone Encounter (Signed)
From: [name]  To: Gita Kudo, MD  Sent: 01/01/2018 11:16 AM PDT  Subject: 2-Procedural Question    Please renew my Rx for 800mg  ibuprofen. I strained a ligament in my knee and went to urgent care today for treatment. Thank you

## 2018-01-01 NOTE — Telephone Encounter (Signed)
Medication requested:   Requested Prescriptions     Pending Prescriptions Disp Refills   . ibuprofen (MOTRIN) 800 MG tablet 30 tablet 0     Sig: Take 1 tablet (800 mg) by mouth every 8 hours as needed for Mild Pain (Pain Score 1-3) or Moderate Pain (Pain Score 4-6).       Date of last refill: 10/14/17    Last OV (provider):      11/24/2017  Last OV (department): 12/03/2017    Next OV (provider):      Visit date not found  Next OV (department): Visit date not found    Pertinent labs:  Results for NASHLEY, CORDOBA (MRN [id number]) as of 01/01/2018 11:57   Ref. Range 08/20/2017 08:30   Sodium Latest Ref Range: 136 - 145 mmol/L 141   Potassium Latest Ref Range: 3.5 - 5.1 mmol/L 4.2   Chloride Latest Ref Range: 98 - 107 mmol/L 104   Bicarbonate Latest Ref Range: 22 - 29 mmol/L 26   Anion Gap Latest Ref Range: 7 - 15 mmol/L 11   BUN Latest Ref Range: 6 - 20 mg/dL 13   Creatinine Latest Ref Range: 0.51 - 0.95 mg/dL 0.82   GFR Latest Units: mL/min >60   Glucose Latest Ref Range: 70 - 99 mg/dL 94   Calcium Latest Ref Range: 8.5 - 10.6 mg/dL 9.6

## 2018-01-08 ENCOUNTER — Encounter (INDEPENDENT_AMBULATORY_CARE_PROVIDER_SITE_OTHER): Payer: Self-pay

## 2018-01-08 ENCOUNTER — Ambulatory Visit (INDEPENDENT_AMBULATORY_CARE_PROVIDER_SITE_OTHER): Payer: BLUE CROSS/BLUE SHIELD

## 2018-01-08 VITALS — BP 112/68 | HR 78 | Temp 97.8°F | Resp 14 | Ht 69.0 in | Wt 191.0 lb

## 2018-01-08 DIAGNOSIS — M170 Bilateral primary osteoarthritis of knee: Secondary | ICD-10-CM

## 2018-01-08 DIAGNOSIS — Z1389 Encounter for screening for other disorder: Secondary | ICD-10-CM

## 2018-01-08 DIAGNOSIS — M25561 Pain in right knee: Principal | ICD-10-CM

## 2018-01-08 DIAGNOSIS — Z1339 Encounter for screening examination for other mental health and behavioral disorders: Secondary | ICD-10-CM

## 2018-01-08 MED ORDER — ALEVE PO
ORAL | Status: DC
Start: ? — End: 2018-08-23

## 2018-01-08 NOTE — Progress Notes (Signed)
Subjective:   Brittany [name] is a 52 year old female who is here for Urgent Care Follow-up (Right Knee Pain )    Patient is being seen as an acute visit.     Seen at [facility] 8/15 with acute right knee pain that started 1 day before while ascending stairs. Xray with R knee mild OA and no fracture. Thought to be strain, sprain or meniscus injury. MRI referral placed and given crutches.     Since then R knee continues to have lateral pain 2/10 resting and 7/10 with exertion. No new effusion and continues to have full ROM. No improvement since urgent care visit.  Using 1 crutch on the left side. Taking ibuprofen 800mg  BID then stopped 4 days ago due to stomach discomfort and switched to aleve. Has not completed MRI yet. Able to bare weight with assistance of crutch.     Review of Systems   Constitutional: Positive for activity change. Negative for chills, fever and unexpected weight change.   Gastrointestinal: Positive for abdominal pain.   Musculoskeletal: Positive for arthralgias and gait problem. Negative for joint swelling and myalgias.        Current Outpatient Medications   Medication Sig Dispense Refill   . cetirizine (ZYRTEC) 10 MG tablet Take 10 mg by mouth daily.     . clotrimazole (LOTRIMIN) 1 % cream Apply 1 Application topically daily. Apply to affected area daily. 45 g 3   . cyclobenzaprine (FLEXERIL) 5 MG tablet Take 1 tablet (5 mg) by mouth 3 times daily as needed for Muscle Spasms. 30 tablet 0   . diclofenac (VOLTAREN) 1 % gel Apply 2 g topically 4 times daily. 1 Tube 0   . fluticasone propionate (FLONASE) 50 MCG/ACT nasal spray Spray 1 spray into each nostril 2 times daily. 1 bottle 5   . ibuprofen (MOTRIN) 800 MG tablet Take 1 tablet (800 mg) by mouth every 8 hours as needed for Mild Pain (Pain Score 1-3) or Moderate Pain (Pain Score 4-6). 30 tablet 1   . levonorgestrel-ethinyl estradiol (AVIANE) 0.1-20 MG-MCG tablet Take 1 tablet by mouth daily. 84 tablet 3   . levothyroxine (SYNTHROID) 112 MCG  tablet Take 1 tablet (112 mcg) by mouth every other day. Alternating with 125 mcg tablet every other day 45 tablet 0   . levothyroxine (SYNTHROID) 125 MCG tablet Take 1 tablet (125 mcg) by mouth every other day. Alternating with 112 mcg tablet every other day. 45 tablet 0   . metroNIDAZOLE (METROGEL) 1 % gel Apply 1 Application topically daily. Apply to affected area daily. 1 Tube 3   . Naproxen Sodium (ALEVE PO)      . tretinoin (RETIN-A) 0.025 % cream Apply a thin layer at bedtime as directed 1 Tube 3   . triamcinolone (KENALOG) 0.1 % ointment Apply a thin layer as directed 1 Tube 0     No current facility-administered medications for this visit.      Allergies   Allergen Reactions   . Vicodin [Hydrocodone-Acetaminophen] Hallucinations     Past Medical History:   Diagnosis Date   . Hypothyroidism     h/o hyperthyroidism s/p radioactive iodine   . Infectious mononucleosis 08/2006   . Insomnia    . Menorrhagia      Past Surgical History:   Procedure Laterality Date   . CHOLECYSTECTOMY, LAP  1995   . left wrist tendon repair  1998   . PB MYOMECTOMY 1-4 MYOMAS 250 GM/< VAGINAL APPR     .  PB REMOVE TONSILS/ADENOIDS,<12 Y/O  1975   . wisdom teeth       Social History     Socioeconomic History   . Marital status: Divorced     Spouse name: Not on file   . Number of children: Not on file   . Years of education: Not on file   . Highest education level: Not on file   Occupational History   . Occupation: Office manager   Social Needs   . Financial resource strain: Not on file   . Food insecurity:     Worry: Not on file     Inability: Not on file   . Transportation needs:     Medical: Not on file     Non-medical: Not on file   Tobacco Use   . Smoking status: Never Smoker   . Smokeless tobacco: Never Used   Substance and Sexual Activity   . Alcohol use: Yes     Comment: 1 bottle wine /week   . Drug use: No   . Sexual activity: Not Currently   Lifestyle   . Physical activity:     Days per week: Not on file     Minutes per session:  Not on file   . Stress: Not on file   Relationships   . Social connections:     Talks on phone: Not on file     Gets together: Not on file     Attends religious service: Not on file     Active member of club or organization: Not on file     Attends meetings of clubs or organizations: Not on file     Relationship status: Not on file   . Intimate partner violence:     Fear of current or ex partner: Not on file     Emotionally abused: Not on file     Physically abused: Not on file     Forced sexual activity: Not on file   Other Topics Concern   . Not on file   Social History Narrative    2019- Divorced and no kids. [place] and marketing and communication. Lives on own and social theatre/culture. Yoga andswimming/hiking     Family History   Problem Relation Name Age of Onset   . Breast Cancer Mother  39   . Other Sister          Uterine fibroids/hysterectomy   . Crohn's Disease Sister     . Breast Cancer Sister  35   . Ovarian Cancer Neg Hx          but sister w/ovarian cysts age 33       Reviewed patients pertinent information related to social history, past medical, past surgical, and family history.     Objective:  Vital signs: BP 112/68 (BP Location: Left arm, BP Patient Position: Sitting, BP cuff size: Large)   Pulse 78   Temp 97.8 F (36.6 C) (Oral)   Resp 14   Ht 5\' 9"  (1.753 m)   Wt 86.6 kg (191 lb)   SpO2 99%   BMI 28.21 kg/m     Physical Exam   Constitutional: She appears well-developed and well-nourished.   Cardiovascular: Normal rate and regular rhythm.   Pulmonary/Chest: Effort normal and breath sounds normal.   Abdominal: Soft. Bowel sounds are normal.   Musculoskeletal: Normal range of motion. She exhibits no edema or deformity.        Right knee: She exhibits normal range  of motion, no swelling, no effusion, no ecchymosis, no deformity, no erythema, no LCL laxity and no MCL laxity. No tenderness found. No medial joint line, no lateral joint line and no patellar tendon tenderness noted.      Previous imaging:  Xray Knee 3 view - 01/01/2018  IMPRESSION:No acute osseous abnormality of the right knee. Mild osteoarthrosis of the medial femorotibial and patellofemoral compartments.    Assessment/Plan:  Chyler was seen today for urgent care follow-up as an acute visit.    Patient with a 8 day history of acute right lateral knee pain after a popping sensation while ascending stairs. She was seen in urgent care 8/15 with Xray showing no fracture. She has continued conservative management with rest, ice, NSAIDs, elevation, brace and crutches without improvement. Given lack of improvement concerned for ligament or other soft tissue injury - recommend patient schedule MRI previously ordered.    Diagnoses and all orders for this visit:    Lateral knee pain, right  Osteoarthritis of both knees, unspecified osteoarthritis type  - Please complete MRI R knee to evaluate for soft tissue injury - please call to schedule  - Continue to use brace, rest, ice, elevation, crutch as needed  - Stop oral NSAID medications given GI upset, can use tylenol PRN  - Start voltaren gel 2g four times daily to right knee    Follow up with PCP    Follow up will be scheduled as directed. Patient verbalized understanding and agreement, and all questions were answered.  See patient instructions.     All of the patients questions were answered and patient verbalized understanding and agreement of the above plan

## 2018-01-08 NOTE — Patient Instructions (Signed)
Please complete MRI R knee to evaluate for soft tissue injury - please call to schedule    Continue to use brace, rest, ice, elevation, crutch as needed    Stop oral NSAID medications, start voltaren gel 2g four times daily to right knee

## 2018-01-23 ENCOUNTER — Ambulatory Visit
Admission: RE | Admit: 2018-01-23 | Discharge: 2018-01-23 | Disposition: A | Discharge status: Home / Self Care | Payer: BLUE CROSS/BLUE SHIELD | Attending: Diagnostic Radiology | Admitting: Diagnostic Radiology

## 2018-01-23 ENCOUNTER — Telehealth (INDEPENDENT_AMBULATORY_CARE_PROVIDER_SITE_OTHER): Payer: Self-pay | Admitting: Internal Medicine

## 2018-01-23 ENCOUNTER — Telehealth (INDEPENDENT_AMBULATORY_CARE_PROVIDER_SITE_OTHER): Payer: Self-pay | Admitting: Physician Assistant

## 2018-01-23 DIAGNOSIS — S83281A Other tear of lateral meniscus, current injury, right knee, initial encounter: Principal | ICD-10-CM

## 2018-01-23 DIAGNOSIS — S8991XA Unspecified injury of right lower leg, initial encounter: Secondary | ICD-10-CM

## 2018-01-23 DIAGNOSIS — M1711 Unilateral primary osteoarthritis, right knee: Secondary | ICD-10-CM | POA: Insufficient documentation

## 2018-01-23 DIAGNOSIS — M2410 Other articular cartilage disorders, unspecified site: Secondary | ICD-10-CM | POA: Insufficient documentation

## 2018-01-23 DIAGNOSIS — M23361 Other meniscus derangements, other lateral meniscus, right knee: Principal | ICD-10-CM | POA: Insufficient documentation

## 2018-01-23 DIAGNOSIS — M76891 Other specified enthesopathies of right lower limb, excluding foot: Secondary | ICD-10-CM | POA: Insufficient documentation

## 2018-01-23 NOTE — Telephone Encounter (Signed)
Fax Received    Date Received: 01/23/18    Date Processed: 01/23/18    From: [facility]    Document Type: Plan of Care     Outside fax imported to patient's media manager and attached to this encounter, please review.  Thank you.

## 2018-01-23 NOTE — Telephone Encounter (Signed)
Paper from [facility] Re; POC  printed from Media and placed at [facility] desk for review/signature.

## 2018-01-23 NOTE — Telephone Encounter (Signed)
Spoke with pt, two identifiers used. Pt updated on MRI results. Consult for ortho placed.

## 2018-02-02 ENCOUNTER — Other Ambulatory Visit (INDEPENDENT_AMBULATORY_CARE_PROVIDER_SITE_OTHER): Payer: Self-pay | Admitting: Internal Medicine

## 2018-02-02 DIAGNOSIS — E039 Hypothyroidism, unspecified: Secondary | ICD-10-CM

## 2018-02-02 NOTE — Telephone Encounter (Signed)
Message/encounter forwarded to the correct point-person/pcp for review & rx refill authorization.

## 2018-02-03 MED ORDER — SYNTHROID 125 MCG OR TABS
ORAL_TABLET | ORAL | 2 refills | Status: DC
Start: 2018-02-03 — End: 2019-02-04

## 2018-02-03 MED ORDER — SYNTHROID 112 MCG OR TABS
ORAL_TABLET | ORAL | 2 refills | Status: DC
Start: 2018-02-03 — End: 2019-02-04

## 2018-02-03 NOTE — Telephone Encounter (Signed)
Patient is requesting refill of medication, please see orders for preloaded prescription refill.    Requested Prescriptions     Pending Prescriptions Disp Refills   . SYNTHROID 125 MCG tablet [Pharmacy Med Name: Synthroid 125 Mcg Tab Abbv] 45 tablet 0     Sig: Take 1 tablet (125 mcg) by mouth every other day. Alternating with 112 mcg tablet every other day.   Marland Kitchen SYNTHROID 112 MCG tablet [Pharmacy Med Name: Synthroid 112 Mcg Tab Abbv] 45 tablet 0     Sig: Take 1 tablet (112 mcg) by mouth every other day. Alternating with 125 mcg tablet every other day       LOV: 11/24/2017  NOV: Visit date not found  Last RX fill: 11/07/2017    Lab Results   Component Value Date    TSH 1.12 08/20/2017

## 2018-02-05 ENCOUNTER — Ambulatory Visit: Payer: BLUE CROSS/BLUE SHIELD | Attending: Physician Assistant | Admitting: Rheumatology

## 2018-02-05 VITALS — BP 113/60 | HR 94 | Temp 98.1°F

## 2018-02-05 DIAGNOSIS — S83281A Other tear of lateral meniscus, current injury, right knee, initial encounter: Secondary | ICD-10-CM | POA: Insufficient documentation

## 2018-02-05 DIAGNOSIS — M1711 Unilateral primary osteoarthritis, right knee: Secondary | ICD-10-CM | POA: Insufficient documentation

## 2018-02-05 MED ORDER — DICLOFENAC SODIUM 1 % EX GEL
4.0000 g | Freq: Four times a day (QID) | TRANSDERMAL | 3 refills | Status: DC
Start: 2018-02-05 — End: 2018-05-14

## 2018-02-05 NOTE — Progress Notes (Signed)
Orthopedics clinic rheumatology follow up for knee  Patient seen and examined with Dr  Malcolm Metro.  Agree with her findings.  ION:GEXBM, Brittany Ivan  [name] returns after having last been seen in 2017 for right de Quervain.  She returns today with a different complaint.  She has had about 4 weeks of pain in her right knee.  This began after she was walking up stairs own heard a pop and had immediate pain followed by swelling.  She tried ibuprofen which upset her stomach and switch to Tylenol.  She has also been using topical diclofenac.  She did a few stretches from physical therapy for knee.  She has been wearing a knee brace.  She overall was very active prior to this and has stopped her exercise.    Past Medical History:   Diagnosis Date   . Hypothyroidism     h/o hyperthyroidism s/p radioactive iodine   . Infectious mononucleosis 08/2006   . Insomnia    . Menorrhagia        Past Surgical History:   Procedure Laterality Date   . CHOLECYSTECTOMY, LAP  1995   . left wrist tendon repair  1998   . PB MYOMECTOMY 1-4 MYOMAS 250 GM/< VAGINAL APPR     . PB REMOVE TONSILS/ADENOIDS,<12 Y/O  1975   . wisdom teeth         Patient Active Problem List   Diagnosis   . Infectious mononucleosis   . Health maintenance examination   . Benign neoplasm of skin, site unspecified   . Cold sensitivity   . Vitamin D deficiency   . Grave's disease   . Right foot pain   . Dysmenorrhea   . Acquired hypothyroidism   . Insomnia   . Sebaceous cyst   . Depression   . Low back pain   . Fatigue   . Palpitations   . Skin lesion   . Pre-operative examination   . Hearing loss   . Cerumen impaction   . Abdominal pain   . Right knee pain   . Routine lab draw   . Dyspepsia   . Intestinal gas excretion   . Gluten intolerance   . Tension headache   . Bilateral low back pain without sciatica   . Allergic rhinitis due to pollen   . Gastroesophageal reflux disease without esophagitis   . Preventative health care   . Impingement syndrome, shoulder,  right   . Radial styloid tenosynovitis   . Hyperlipidemia, unspecified hyperlipidemia type   . Chronic midline low back pain without sciatica         Current Outpatient Medications:   .  cetirizine (ZYRTEC) 10 MG tablet, Take 10 mg by mouth daily., Disp: , Rfl:   .  clotrimazole (LOTRIMIN) 1 % cream, Apply 1 Application topically daily. Apply to affected area daily., Disp: 45 g, Rfl: 3  .  cyclobenzaprine (FLEXERIL) 5 MG tablet, Take 1 tablet (5 mg) by mouth 3 times daily as needed for Muscle Spasms., Disp: 30 tablet, Rfl: 0  .  diclofenac (VOLTAREN) 1 % gel, Apply 2 g topically 4 times daily., Disp: 1 Tube, Rfl: 0  .  fluticasone propionate (FLONASE) 50 MCG/ACT nasal spray, Spray 1 spray into each nostril 2 times daily., Disp: 1 bottle, Rfl: 5  .  ibuprofen (MOTRIN) 800 MG tablet, Take 1 tablet (800 mg) by mouth every 8 hours as needed for Mild Pain (Pain Score 1-3) or Moderate Pain (Pain Score  4-6)., Disp: 30 tablet, Rfl: 1  .  levonorgestrel-ethinyl estradiol (AVIANE) 0.1-20 MG-MCG tablet, Take 1 tablet by mouth daily., Disp: 84 tablet, Rfl: 3  .  metroNIDAZOLE (METROGEL) 1 % gel, Apply 1 Application topically daily. Apply to affected area daily., Disp: 1 Tube, Rfl: 3  .  Naproxen Sodium (ALEVE PO), , Disp: , Rfl:   .  SYNTHROID 112 MCG tablet, Take 1 tablet (112 mcg) by mouth every other day. Alternating with 125 mcg tablet every other day, Disp: 45 tablet, Rfl: 2  .  SYNTHROID 125 MCG tablet, Take 1 tablet (125 mcg) by mouth every other day. Alternating with 112 mcg tablet every other day., Disp: 45 tablet, Rfl: 2  .  tretinoin (RETIN-A) 0.025 % cream, Apply a thin layer at bedtime as directed, Disp: 1 Tube, Rfl: 3  .  triamcinolone (KENALOG) 0.1 % ointment, Apply a thin layer as directed, Disp: 1 Tube, Rfl: 0    Allergies   Allergen Reactions   . Vicodin [Hydrocodone-Acetaminophen] Hallucinations       Physical exam  Vitals:    02/05/18 0818   BP: 113/60   Pulse: 94   Temp: 98.1 F (36.7 C)     There is no  height or weight on file to calculate BMI.    General: well developed female  In no acute distress  Skin no rashes  Head and neck is unremarkable  Good range of motion of the  knees without effusion or instability    MRI shows severe lateral compartment osteoarthritis with associated meniscal tear            FINDINGS:  There is predominantly longitudinal horizontal undersurface tearing body of the lateral meniscus extending towards the junction with the posterior horn and anterior horn. The body is diminutive consistent with superimposed inner margin tearing. There is a very small superiorly extruded meniscal flap arising from the body (coronal image 18). The posterior root attachment remains attached. There is mild thickening and signal abnormality of the origin of the fibular collateral ligament. There is popliteus tendinosis near its femoral attachment site. The lateral supporting structures are otherwise normal. There is full-thickness cartilage loss posterior aspect of the lateral tibial plateau (coronal image 23). There is no displaced cartilage fragment. There is mild cartilage thinning of the lateral femoral condyle with focal areas of moderate to high-grade chondral fissuring of the far posterior aspect of the femoral condyle with subjacent reactive marrow edema. Additional subchondral   marrow edema along the periphery of the lateral tibial plateau.    Medial meniscus appears diminutive, particularly the body, but is otherwise intact.. The medial supporting structures are normal. There is mild diffuse cartilage thinning the medial femorotibial compartment. Small subchondral cyst along the central aspect of the medial tibial plateau.    The anterior cruciate ligament is normal. The posterior cruciate ligament is normal.    The patellar and extensor tendons are normal. There is moderate grade chondral fissuring in the lateral patellar facet and high-grade chondral fissuring of the central femoral  trochlea.    There is no effusion within the joint. No soft tissue swelling is present.. The visualized musculature shows normal signal with no edema.    CONCURRENT SUPERVISION:  I have reviewed the images and agree with the fellow's report.        Preliminary created by: Felicity Coyer   Signed by: Vanetta Mulders 01/23/2018 09:15:53   Impression     IMPRESSION:  Longitudinal horizontal tearing of a  diminutive body of the lateral meniscus extending towards the junctions of the posterior and anterior horns, with a very small superior the extrusion meniscal flap.    Tricompartmental osteoarthrosis with large areas of full-thickness cartilage loss of the lateral tibial plateau posteriorly. Additional high-grade cartilage fissuring of the posterior aspect of the lateral femoral condyle and central femoral trochlea.    Diminutive appearance of the medial meniscal body suggesting inner margin tearing.    Remote low-grade injury of fibular collateral ligament.         Assessment and plan  #1 Osteoarthritis of the right knee/meniscal tear.  Patient with likely longstanding both osteoarthritis and meniscal tear, with recent stress placed on the area after coming down the stairs with continued pain.    We discussed that meniscal tears are seen in almost 100% of patients with at least moderate osteoarthritis.  We discussed this contributes to the overall cartilage insufficiency from the arthritis.  We discussed it is recommended not to have arthroscopic surgery.  Discussed that surgery could not repair the meniscus, only treatment.  Follow this could help with mechanical locking, it can increase cartilage insufficiency and long-term problems from cartilage insufficiency.    The benefits of exercise were discussed.  Patient was encouraged to establish and maintain a consistent exercise routine.  The importance of doing the same amount and same type of exercise every day 7 days a week was discussed.  The importance of not  overdoing it some days, and resting up other days was emphasized.    Discussed with her continued use of topical diclofenac and avoiding nonsteroidals orally.    Discussed the options of local injection as well as knee replacement.  However at this time she does not wish any additional treatment.

## 2018-02-05 NOTE — Progress Notes (Signed)
Orthopedics clinic rheumatology follow up for right knee pain.  [id number]:[id number], Brittany [name]     M Brittany [name] is a 52 y/o woman presenting to clinic for evaluation for her right knee. She reports that about 4 weeks ago she was walking up her stairs with heavy bags of grocery when her right knee twisted. She heard a pop and felt immediate pain. Her knee swelled soon after. She started taking ibuprofen and icing her knee. She was evaluated by internal medicine and an MRI was completed revealing tricompartmental OA and meniscal tear.     She switched to tylenol because of GI upset and has been taking 1-2 a day. She has also been applying diclofenac ointment up to 4x a day. She sees physical therapy for her neck and was given a few exercises to do for both her knees. She has also been wearing a brace on her knee all day. She reports the brace helps prevent her from twisting her knee which causes the twinges of pain. She denies any unsteadiness or instability.     Prior to injuring her right knee, she was doing yoga 2x a week and walking 3-4x a week. She has had to cut out all of this activity. She is able to get around her house ok but has been using one crutch under her left arm when walking long distances.     Overall, she feels that she is doing better since the initial injury 4 weeks ago. She notes that she has arthritis in her left knee that she thinks is worse than her right knee. When she initially twisted her right knee, she thinks it was because she was trying to avoid putting weight on her left knee, which was hurting her more than usual.     She reports otherwise feeling well today.     Past Medical History:   Diagnosis Date   . Hypothyroidism     h/o hyperthyroidism s/p radioactive iodine   . Infectious mononucleosis 08/2006   . Insomnia    . Menorrhagia        Past Surgical History:   Procedure Laterality Date   . CHOLECYSTECTOMY, LAP  1995   . left wrist tendon repair  1998   . PB MYOMECTOMY 1-4 MYOMAS  250 GM/< VAGINAL APPR     . PB REMOVE TONSILS/ADENOIDS,<12 Y/O  1975   . wisdom teeth         Patient Active Problem List   Diagnosis   . Infectious mononucleosis   . Health maintenance examination   . Benign neoplasm of skin, site unspecified   . Cold sensitivity   . Vitamin D deficiency   . Grave's disease   . Right foot pain   . Dysmenorrhea   . Acquired hypothyroidism   . Insomnia   . Sebaceous cyst   . Depression   . Low back pain   . Fatigue   . Palpitations   . Skin lesion   . Pre-operative examination   . Hearing loss   . Cerumen impaction   . Abdominal pain   . Right knee pain   . Routine lab draw   . Dyspepsia   . Intestinal gas excretion   . Gluten intolerance   . Tension headache   . Bilateral low back pain without sciatica   . Allergic rhinitis due to pollen   . Gastroesophageal reflux disease without esophagitis   . Preventative health care   . Impingement syndrome, shoulder, right   .  Radial styloid tenosynovitis   . Hyperlipidemia, unspecified hyperlipidemia type   . Chronic midline low back pain without sciatica         Current Outpatient Medications:   .  cetirizine (ZYRTEC) 10 MG tablet, Take 10 mg by mouth daily., Disp: , Rfl:   .  clotrimazole (LOTRIMIN) 1 % cream, Apply 1 Application topically daily. Apply to affected area daily., Disp: 45 g, Rfl: 3  .  cyclobenzaprine (FLEXERIL) 5 MG tablet, Take 1 tablet (5 mg) by mouth 3 times daily as needed for Muscle Spasms., Disp: 30 tablet, Rfl: 0  .  diclofenac (VOLTAREN) 1 % gel, Apply 2 g topically 4 times daily., Disp: 1 Tube, Rfl: 0  .  fluticasone propionate (FLONASE) 50 MCG/ACT nasal spray, Spray 1 spray into each nostril 2 times daily., Disp: 1 bottle, Rfl: 5  .  ibuprofen (MOTRIN) 800 MG tablet, Take 1 tablet (800 mg) by mouth every 8 hours as needed for Mild Pain (Pain Score 1-3) or Moderate Pain (Pain Score 4-6)., Disp: 30 tablet, Rfl: 1  .  levonorgestrel-ethinyl estradiol (AVIANE) 0.1-20 MG-MCG tablet, Take 1 tablet by mouth daily., Disp:  84 tablet, Rfl: 3  .  metroNIDAZOLE (METROGEL) 1 % gel, Apply 1 Application topically daily. Apply to affected area daily., Disp: 1 Tube, Rfl: 3  .  Naproxen Sodium (ALEVE PO), , Disp: , Rfl:   .  SYNTHROID 112 MCG tablet, Take 1 tablet (112 mcg) by mouth every other day. Alternating with 125 mcg tablet every other day, Disp: 45 tablet, Rfl: 2  .  SYNTHROID 125 MCG tablet, Take 1 tablet (125 mcg) by mouth every other day. Alternating with 112 mcg tablet every other day., Disp: 45 tablet, Rfl: 2  .  tretinoin (RETIN-A) 0.025 % cream, Apply a thin layer at bedtime as directed, Disp: 1 Tube, Rfl: 3  .  triamcinolone (KENALOG) 0.1 % ointment, Apply a thin layer as directed, Disp: 1 Tube, Rfl: 0    Allergies   Allergen Reactions   . Vicodin [Hydrocodone-Acetaminophen] Hallucinations       Physical exam  Vitals:    02/05/18 0818   BP: 113/60   Pulse: 94   Temp: 98.1 F (36.7 C)     There is no height or weight on file to calculate BMI.    General: well developed female  In no acute distress  Skin no rashes  Head and neck is unremarkable  INSPECTION: normal without deformity, erythema, edema or overlying skin changes. No knee effusion. Normal alignment noted.   PALPATION:  No tenderness medial or lateral joint line   No tenderness patella tendon  No tenderness patellar facets  No tenderness posterior knee   No tenderness ITB/lateral femoral condyle  ROM:   Full ROM flexion and extension  STRENGTH TESTING:  5/5 strength knee flexion   5/5 strength extension  STABILITY TESTING:   Neg varus ar valgus stress  Neg anterior and posterior drawer sign    Imaging:  MRI Right Knee (01/23/2018)  Longitudinal horizontal tearing of a diminutive body of the lateral meniscus extending towards the junctions of the posterior and anterior horns, with a very small superior the extrusion meniscal flap.    Tricompartmental osteoarthrosis with large areas of full-thickness cartilage loss of the lateral tibial plateau posteriorly. Additional  high-grade cartilage fissuring of the posterior aspect of the lateral femoral condyle and central femoral trochlea.    Diminutive appearance of the medial meniscal body suggesting inner margin tearing.  Remote low-grade injury of fibular collateral ligament.    Popliteus tendinosis.    Assessment and plan  #1 Right Knee Pain/OA/Meniscus Tear - Discussed with patient how meniscal injury is very common in patients with OA and reviewed tx options including conservative tx with PT, CSI, and the last option of knee replacement rx. She feels that her symptoms are overall improving and she would like to continue with working on increasing her exercise routine. She has been given PT exercises already.   - Renewal of diclofenac, can continue to use up to 4x a day  - Encouraged gradual re-introduction of physical activity and maintaining consistent exercise   - Advised patient to notify clinic if she would like a PT referral  - Advised against wearing knee brace if she doesn't feel it is helping with stability as it may result in more muscle weakness  - RTC if pain persists and if patient is interested in trying CSI     Patient seen and examined with attending, Dr. Lowella Petties, who agrees with the above assessment and plan.     Louis Matte, MD  Internal Medicine, PGY-2  [place]  (p) [phone]

## 2018-02-11 ENCOUNTER — Encounter (HOSPITAL_BASED_OUTPATIENT_CLINIC_OR_DEPARTMENT_OTHER): Payer: Self-pay | Admitting: Rheumatology

## 2018-02-12 ENCOUNTER — Encounter (INDEPENDENT_AMBULATORY_CARE_PROVIDER_SITE_OTHER): Payer: Self-pay | Admitting: Internal Medicine

## 2018-02-13 NOTE — Telephone Encounter (Signed)
Records updated. FYI.

## 2018-02-13 NOTE — Telephone Encounter (Signed)
From: [name]  To: Gita Kudo, MD  Sent: 02/12/2018 11:18 AM PDT  Subject: 20-Other    Hello, I received my flu vaccination at the [facility] at [address] today.  Brittany [name]

## 2018-04-09 ENCOUNTER — Other Ambulatory Visit (INDEPENDENT_AMBULATORY_CARE_PROVIDER_SITE_OTHER): Payer: Self-pay | Admitting: Obstetrics & Gynecology

## 2018-04-09 DIAGNOSIS — N920 Excessive and frequent menstruation with regular cycle: Principal | ICD-10-CM

## 2018-04-24 NOTE — Telephone Encounter (Signed)
LOV:12/14/2015 Dr.Deak  VAE:PNTB  LR:08/08/2017    Called and spoke to Dominica from [facility] and per pharmacy staff pt needs more refills. Out of fills.

## 2018-04-27 MED ORDER — LEVONORGESTREL-ETHINYL ESTRAD 0.1-20 MG-MCG OR TABS
1.00 | ORAL_TABLET | Freq: Every day | ORAL | 3 refills | Status: DC
Start: 2018-04-27 — End: 2019-03-11

## 2018-05-14 ENCOUNTER — Other Ambulatory Visit (HOSPITAL_BASED_OUTPATIENT_CLINIC_OR_DEPARTMENT_OTHER): Payer: Self-pay | Admitting: Rheumatology

## 2018-05-14 DIAGNOSIS — M1711 Unilateral primary osteoarthritis, right knee: Principal | ICD-10-CM

## 2018-05-14 NOTE — Telephone Encounter (Signed)
Triage/Float RN Note: Medication Refill Request     RX pended. Routed to Provider for further review and    Last Refill Date: 02/05/18  Last visit in this department 02/05/2018  Next visit in this department Visit date not found  Requested Medication(s):  Requested Prescriptions     Pending Prescriptions Disp Refills   . diclofenac (VOLTAREN) 1 % gel 1 Tube 3     Sig: Apply 4 g topically 4 times daily.     Send to:     [facility]  [address] [place] [address]  Phone: [phone] Fax: [place], [place] - [address]  [id number]  [place] [address]  Phone: [phone] Fax: [phone] Alternate Fax: [phone]     Allergies   Allergen Reactions   . Vicodin [Hydrocodone-Acetaminophen] Hallucinations     Last labs:   Lab Results   Component Value Date    CHOL 253 (H) 08/20/2017    HDL 56 08/20/2017    LDLCALC 176 (H) 08/20/2017    TRIG 103 08/20/2017    TSH 1.12 08/20/2017    HIV Test  Results for orders placed or performed in visit on 03/25/13   HIV 1/2 Antibody Yellow serum separator tube   Result Value Ref Range    HIV 1/2 Antibody & P24 Antigen Assay Non Reactive      Blood Pressure   02/05/18 113/60   01/08/18 112/68   01/01/18 113/73      Health Maintenance Due   Topic Date Due   . Shingles Vaccine (1 of 2) 12/17/2015   . PHQ9 Depression Monitoring doc flowsheet  05/10/2018      Current Medication(s):   Current Outpatient Medications   Medication Sig Dispense Refill   . cetirizine (ZYRTEC) 10 MG tablet Take 10 mg by mouth daily.     . clotrimazole (LOTRIMIN) 1 % cream Apply 1 Application topically daily. Apply to affected area daily. 45 g 3   . cyclobenzaprine (FLEXERIL) 5 MG tablet Take 1 tablet (5 mg) by mouth 3 times daily as needed for Muscle Spasms. 30 tablet 0   . diclofenac (VOLTAREN) 1 % gel Apply 4 g topically 4 times daily. 1 Tube 3   . fluticasone propionate (FLONASE) 50 MCG/ACT nasal spray Spray 1 spray into each  nostril 2 times daily. 1 bottle 5   . ibuprofen (MOTRIN) 800 MG tablet Take 1 tablet (800 mg) by mouth every 8 hours as needed for Mild Pain (Pain Score 1-3) or Moderate Pain (Pain Score 4-6). 30 tablet 1   . levonorgestrel-ethinyl estradiol (AVIANE) 0.1-20 MG-MCG tablet Take 1 tablet by mouth daily. 84 tablet 3   . metroNIDAZOLE (METROGEL) 1 % gel Apply 1 Application topically daily. Apply to affected area daily. 1 Tube 3   . Naproxen Sodium (ALEVE PO)      . SYNTHROID 112 MCG tablet Take 1 tablet (112 mcg) by mouth every other day. Alternating with 125 mcg tablet every other day 45 tablet 2   . SYNTHROID 125 MCG tablet Take 1 tablet (125 mcg) by mouth every other day. Alternating with 112 mcg tablet every other day. 45 tablet 2   . tretinoin (RETIN-A) 0.025 % cream Apply a thin layer at bedtime as directed 1 Tube 3   . triamcinolone (KENALOG) 0.1 % ointment Apply a thin layer as directed 1 Tube 0  No current facility-administered medications for this visit.

## 2018-05-15 MED ORDER — DICLOFENAC SODIUM 1 % EX GEL
4.0000 g | Freq: Four times a day (QID) | TRANSDERMAL | 3 refills | Status: DC
Start: 2018-05-15 — End: 2018-08-05

## 2018-06-25 ENCOUNTER — Telehealth (INDEPENDENT_AMBULATORY_CARE_PROVIDER_SITE_OTHER): Payer: Self-pay | Admitting: Internal Medicine

## 2018-06-25 NOTE — Telephone Encounter (Signed)
Forms printed and placed in Dr. Moore's in box.

## 2018-06-25 NOTE — Telephone Encounter (Signed)
Fax Received    Date Received: 06/24/2018    Date Processed: 06/25/2018    From: PRN     Document Type: Plan of Care- signature required, received @ 2:02 pm ; located in physician's "LIM to Print" folder ending in 66. Please send to "DONE FOLDER" after printing.     Thank you.

## 2018-06-27 ENCOUNTER — Encounter (INDEPENDENT_AMBULATORY_CARE_PROVIDER_SITE_OTHER): Payer: Self-pay | Admitting: Internal Medicine

## 2018-07-10 ENCOUNTER — Telehealth (INDEPENDENT_AMBULATORY_CARE_PROVIDER_SITE_OTHER): Payer: Self-pay | Admitting: Internal Medicine

## 2018-07-10 NOTE — Telephone Encounter (Signed)
Fax Received    Date Received: 07/10/18    Date Processed: 07/10/18    From: PRN PT    Document Type: PT progress notes requesting signature received @ 1:37pm; located in physician's "LIM to Print" folder ending in 91. Please send to "DONE FOLDER" after printing.      Outside fax, please print and review.  Thank you.

## 2018-07-13 NOTE — Telephone Encounter (Signed)
No forms in the LIM folder.

## 2018-07-14 NOTE — Telephone Encounter (Signed)
Forms printed

## 2018-07-14 NOTE — Telephone Encounter (Signed)
POC received, prepped, and placed in nurses box for md signature.

## 2018-07-15 NOTE — Telephone Encounter (Signed)
POC received. Faxed back as requested to:    [facility]  [place], [place] [address]  [facility] [phone]  Tax ID # [id number]  NPI # [id number]

## 2018-07-15 NOTE — Telephone Encounter (Signed)
Placed forms in PCP's inbox for signature.

## 2018-08-05 ENCOUNTER — Other Ambulatory Visit (HOSPITAL_BASED_OUTPATIENT_CLINIC_OR_DEPARTMENT_OTHER): Payer: Self-pay | Admitting: Rheumatology

## 2018-08-05 ENCOUNTER — Other Ambulatory Visit (INDEPENDENT_AMBULATORY_CARE_PROVIDER_SITE_OTHER): Payer: Self-pay | Admitting: Internal Medicine

## 2018-08-05 DIAGNOSIS — M545 Low back pain, unspecified: Secondary | ICD-10-CM

## 2018-08-05 DIAGNOSIS — M1711 Unilateral primary osteoarthritis, right knee: Secondary | ICD-10-CM

## 2018-08-05 DIAGNOSIS — G8929 Other chronic pain: Principal | ICD-10-CM

## 2018-08-06 MED ORDER — CYCLOBENZAPRINE HCL 5 MG OR TABS
5.00 mg | ORAL_TABLET | Freq: Three times a day (TID) | ORAL | 0 refills | Status: DC | PRN
Start: 2018-08-06 — End: 2019-11-23

## 2018-08-06 MED ORDER — DICLOFENAC SODIUM 1 % EX GEL
4.00 g | Freq: Four times a day (QID) | TRANSDERMAL | 3 refills | Status: DC
Start: 2018-08-06 — End: 2020-02-25

## 2018-08-06 NOTE — Telephone Encounter (Signed)
FLEXERIL is not currently included in the Pharmacy Refill Clinic protocols. Re-routing to the responsible staff for processing.  Thank you

## 2018-08-06 NOTE — Telephone Encounter (Signed)
LOV: 02/05/2018  LRD: 05/15/2018    I will forward refill request to provider for review.

## 2018-08-06 NOTE — Telephone Encounter (Signed)
Patient is requesting refill of medication, please see orders for preloaded prescription refill.    Requested Prescriptions     Pending Prescriptions Disp Refills   . cyclobenzaprine (FLEXERIL) 5 MG tablet 30 tablet 0     Sig: Take 1 tablet (5 mg) by mouth 3 times daily as needed for Muscle Spasms.       LOV:   11/24/17  NOV:   Visit date not found  Last RX fill:     CURES NONE    Last basic metabolic panel:  Lab Results   Component Value Date    NA 141 08/20/2017    K 4.2 08/20/2017    CL 104 08/20/2017    BICARB 26 08/20/2017    BUN 13 08/20/2017    CREAT 0.82 08/20/2017    GLU 94 08/20/2017    [place] 9.6 08/20/2017

## 2018-08-19 ENCOUNTER — Telehealth (INDEPENDENT_AMBULATORY_CARE_PROVIDER_SITE_OTHER): Payer: Self-pay | Admitting: Family

## 2018-08-19 ENCOUNTER — Encounter (INDEPENDENT_AMBULATORY_CARE_PROVIDER_SITE_OTHER): Payer: Self-pay | Admitting: Physician Assistant

## 2018-08-19 ENCOUNTER — Ambulatory Visit (INDEPENDENT_AMBULATORY_CARE_PROVIDER_SITE_OTHER): Payer: Self-pay

## 2018-08-19 DIAGNOSIS — Z1159 Encounter for screening for other viral diseases: Secondary | ICD-10-CM

## 2018-08-19 LAB — COVID-19 CORONAVIRUS DETECTION ASSAY AT ~~LOC~~ LAB: COVID-19 Coronavirus Result: NOT DETECTED

## 2018-08-19 LAB — EMMI, COVID-19: EMMI Video Order Number: 11723551575

## 2018-08-19 NOTE — Telephone Encounter (Signed)
Pt advised on negative results. She is still fatigued and still has a fever.     Advised on conditions to end self isolation. Sent Mychart per her request.     Component Value Lab   COVID-19 Source Oropharyngeal  CALM   COVID-19 Coronavirus Detection Not Detected  CALM

## 2018-08-19 NOTE — Telephone Encounter (Signed)
Patient notified test for COVID-19 are NEGATIVE:          Patient instructions given below:  Because this is a newer test the CDC and [place] still recommend that you leave home only after these three things have happened:    1. You have had no fever for at least 72 hours (that is three full days of no fever without the use medicine that reduces fevers)  2. Other symptoms have improved (for example, when your cough or shortness of breath have improved)  3. At least 7 days have passed since your symptoms first appeared    We advise you to remain in home isolation until all CDC guidelines are met.  Please implement strict social distancing, and only leave home to seek/attend medical care.    Contact our dedicated nurse line [phone], if you are noting worsening respiratory symptoms and need advice. This line is open 8 am -5 pm 7 days a week.  If sudden and severe worsening in symptoms please call [phone].      Thank you,  Jonetta Speak, NP  Signature Derived From [place], August 19, 2018, 3:24 PM

## 2018-08-19 NOTE — Interdisciplinary (Signed)
Obtained oropharyngeal  from patient and submitted to lab. Patient given self care Isolation Instructions to follow while waiting for test results.  Verified Patient with two identifiers.

## 2018-08-19 NOTE — Telephone Encounter (Signed)
Have not been involved in recent care-she can schedule appt

## 2018-08-20 ENCOUNTER — Telehealth (INDEPENDENT_AMBULATORY_CARE_PROVIDER_SITE_OTHER): Payer: BLUE CROSS/BLUE SHIELD | Admitting: Internal Medicine

## 2018-08-20 ENCOUNTER — Encounter (INDEPENDENT_AMBULATORY_CARE_PROVIDER_SITE_OTHER): Payer: Self-pay | Admitting: Internal Medicine

## 2018-08-20 DIAGNOSIS — M791 Myalgia, unspecified site: Secondary | ICD-10-CM

## 2018-08-20 DIAGNOSIS — R509 Fever, unspecified: Secondary | ICD-10-CM

## 2018-08-20 NOTE — Telephone Encounter (Signed)
Pt.notified

## 2018-08-20 NOTE — Progress Notes (Deleted)
Chief Complaint   Patient presents with   . Results     follow-up       SUBJECTIVE::Brittany [name] is a 53 year old female here for follow-up. Problem list was updated and reviewed today with patient:    #1: Works in shifts in Doctor, general practice at [employer]    #2: Had fever 100.6 yesterday and body aches and headache. Started Tuesday    #3: Went to drive thru [place] and covid testing. Is negative    #4: Off work 2 days and will start tele tomorrow and needs to isolate 7 days from start sx. Not scheduled for in person until 4/13    Ambulatory COVID-19 Evaluation  (last updated 08/14/2018)    GUIDANCE EVOLVES AS OUR TESTING CAPACITY EVOLVES AND THE COVID-19 OUTBREAK EVOLVES. THANK YOU FOR WORKING WITH [place] AND UNDERSTANDING GUIDANCE IS CHANGING QUICKLY.     Check-in  1. All patients with symptoms of an acute respiratory viral infection and personnel evaluating should be given a surgical mask as soon as they enter the clinic (give at front desk or front door).  Make sure the patient is instructed to wear over their nose and mouth and asked to use an alcohol hand rub on their hands.   2. Patients who have respiratory symptoms (fever, cough, shortness of breath) should be placed immediately in a private room, Art therapist and physician.  Any patient companion should also wear a surgical mask.  All staff interacting face to face with patient (I.e. vitals, exam, testing, etc) should be instructed to wear a surgical mask, eye protection, gown, and gloves when interacting with the patient.        Please note:  Patients with mild illness who do not belong to a high-risk population do not need to be tested.These patients can be counseled to call back if shortness of breath, worsening cough or high fever develop and stay at home until at least 72 hours after fever resolves and respiratory symptoms improve.    Please give them UCSDCOVID19HOMEISOLATIONINSTRUCTIONS in patient instructions    Evaluation of  patient:  Do patient have a fever OR a new cough or shortness of breath (SOB) {Yes / No:63}  Other symptoms seen in COVID 19 patient include new diarrhea, new myalgias, and acute loss of smell.    AND   Yes to one of the following categories?  . High risk includes age>65, smoker, chronic lung disease, diabetes, active cancer, chronic heart/liver/kidney disease, immunosuppression? {Yes / No:63}  . Any resident of or provider working in a senior living facility, including skilled nursing facilities or assisted living facilities.{Yes / No:63}  . Contact to known COVID-19 cases.{Yes / No:63}  . Health care workers.{Yes / No:63}  . Persons who care for the elderly.{Yes / No:63}  . Travel  in the last 14 days?{Yes / No:63}  . Person experiencing homelessness? {Homelessness Yes/No:25858}  . Close contact with anyone in above above categories? {Yes / No:63}  . Pregnant? {Yes / No:63}    If yes to both symptoms and risk category:    Does patient need admission?  Do they meet clinical criteria for admission (COVID-19 alone is not a criteria for admission)?{YES***/NO:120090}  You can go use [email or web address] (copy and paste link) to calculate this patient's clinical criteria for admission.     If YES to needs admission  -Please call the operator [phone] to ask for the [facility] (Infection Control) on call for next steps.  (Transportation precautions).  -Please arrange  next steps for admission through ED including isolation precautions (no direct admission to the floor).    For Patients Not Needing Admission   -Make sure patient is in a room by themselves if there is a companion in the room they should be masked also.   - All staff interacting face to face with patient (I.e. vitals, exam, testing, etc) should be instructed to wear a surgical mask, eye protection, gown, and gloves when interacting with the patient.     -Evaluate the patient for their illness.   -Obtain a rapid influenza  point of care test first if your clinical area has that capability.  -Obtain just a single oropharyngeal specimen to be sent with the COVID order.    -Consider also doing separate Respiratory pathogen if immunosuppressed (Nasopharyngeal).  - Minimize the number of staff entering/exiting the room to minimize exposure and PPE use.  - Avoid use of nebulizers as they require airborne precautions in potential COVID patients.  Instead use MDI and spacer if needed.  - If needed, venipuncture lab tests should be completed in the room by a phlebotomist or clinician in full PPE.   - Chest x-ray should be deferred unless it will alter management.    - If chest xray is needed, please call ahead to the radiology site and inform them of a potential COVID patient coming for imaging so that appropriate PPE and isolation can be used on patient arrival.     Discharge information:  -Please let them know time frame 72-96 hours for result.  Negative results will release on MyChart.   Positive will be called to COVID team and ordering provider.  -The patient should be asked to stay home and self-isolate and given specific discharge instructions (SmartPhrase UCSDCOVID19HOMEISOLATIONINSTRUCTIONS).   -Please let patients know they should self isolate until 3 days after symptoms resolve even if their test is negative (as per instructions in the SmartPhrase)  -They should be instructed to call back if worsening (increased fever, shortness of breath or worsening cough).   -Clinic staff will contact the patients with results (check to see if already called by Covid team)  -Clinic staff will report patients who test positive to IPCE within 24 hours (No need to call during night)  -While test results are pending please call patient daily (check to see if already called by Covid Ambulatory team and if so no need to call again)  -For patients who test negative, request clinic staff call patient back in 24 hours for a symptom check.    negative      Patient Active Problem List    Diagnosis Date Noted   . Chronic midline low back pain without sciatica 10/17/2015   . Hyperlipidemia, unspecified hyperlipidemia type 09/08/2015   . Impingement syndrome, shoulder, right 05/02/2015   . Radial styloid tenosynovitis 05/02/2015   . Preventative health care 11/28/2014   . Allergic rhinitis due to pollen 04/11/2014   . Gastroesophageal reflux disease without esophagitis 04/11/2014   . Bilateral low back pain without sciatica 09/21/2013   . Tension headache 12/26/2011   . Gluten intolerance 09/17/2011   . Dyspepsia 07/15/2011   . 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 Abdominal pain 03/13/2011   . Right knee pain 03/13/2011   . Routine lab draw 03/13/2011   . Hearing loss 12/14/2010   . Cerumen impaction 12/14/2010   . Pre-operative examination 12/06/2010     Brittany [name] is a 53 year old female G0P0000 with h/o  menorrhagia who presents today for preop visit for hysteroscopy, D&C, possible resection of fibroid, and Novasure ablation.     Work-up:  11/22/10: EMB benign  11/22/10 pap: negative    11/28/10 Pelvic sono:  The uterus shows several intramural fibroids, largest measuring 3.7 x 3.2 cm   containing a subcentimeter cystic degeneration, abutting the endometrium. The  endometrial stripe is 1.8-mm. Both ovaries are normal measuring 2.6 x 1.3 x   1.7 cm on the right and 2.6 x 2.7 x 2.0 cm on the left. No free fluid is   seen       . Skin lesion 12/05/2010   . Palpitations 10/02/2010   . Low back pain 06/15/2010   . Fatigue 06/15/2010   . Depression 07/25/2009   . Sebaceous cyst 03/23/2009   . Insomnia 11/16/2008   . Acquired hypothyroidism 09/22/2008   . Right foot pain 08/18/2008   . Dysmenorrhea 08/18/2008   . Grave's disease 01/15/2008   . Vitamin D deficiency 01/06/2008   . Benign neoplasm of skin, site unspecified 08/27/2007   . Cold sensitivity 08/27/2007   . Health maintenance examination 02/04/2007     Last pap 2006, has had squamous cells, repeats  normal  Mammogram: 2/08, all normal  CHOL 225  HDL65  LDLCALC      143    TRIG 87  (12/15/06)  Cycles: regular q28 days, last two cycles "heavier flow" "more cramping" and 1day longer (5 instead of 4)        . Infectious mononucleosis 09/11/2006       Past medical history and family history reviewed and updated today as below.  Allergies and medications reviewed and updated as below.    Past Medical History:   Diagnosis Date   . Hypothyroidism     h/o hyperthyroidism s/p radioactive iodine   . Infectious mononucleosis 08/2006   . Insomnia    . Menorrhagia        Social History:  Social History     Tobacco Use   . Smoking status: Never Smoker   . Smokeless tobacco: Never Used   Substance Use Topics   . Alcohol use: Yes     Comment: 1 bottle wine /week   . Drug use: No       Allergies:  Allergies   Allergen Reactions   . Vicodin [Hydrocodone-Acetaminophen] Hallucinations       Current Outpatient Medications   Medication Sig   . cetirizine (ZYRTEC) 10 MG tablet Take 10 mg by mouth daily.   . clotrimazole (LOTRIMIN) 1 % cream Apply 1 Application topically daily. Apply to affected area daily.   . cyclobenzaprine (FLEXERIL) 5 MG tablet Take 1 tablet (5 mg) by mouth 3 times daily as needed for Muscle Spasms.   . diclofenac (VOLTAREN) 1 % gel Apply 4 g topically 4 times daily.   . fluticasone propionate (FLONASE) 50 MCG/ACT nasal spray Spray 1 spray into each nostril 2 times daily.   Marland Kitchen ibuprofen (MOTRIN) 800 MG tablet Take 1 tablet (800 mg) by mouth every 8 hours as needed for Mild Pain (Pain Score 1-3) or Moderate Pain (Pain Score 4-6).   Marland Kitchen levonorgestrel-ethinyl estradiol (AVIANE) 0.1-20 MG-MCG tablet Take 1 tablet by mouth daily.   . metroNIDAZOLE (METROGEL) 1 % gel Apply 1 Application topically daily. Apply to affected area daily.   . Naproxen Sodium (ALEVE PO)    . SYNTHROID 112 MCG tablet Take 1 tablet (112 mcg) by mouth every other day. Alternating with  125 mcg tablet every other day   . SYNTHROID 125 MCG tablet Take 1  tablet (125 mcg) by mouth every other day. Alternating with 112 mcg tablet every other day.   . tretinoin (RETIN-A) 0.025 % cream Apply a thin layer at bedtime as directed   . triamcinolone (KENALOG) 0.1 % ointment Apply a thin layer as directed     No current facility-administered medications for this visit.        ROS:    Constitutional: negative for fever, chills, weight loss or weight gain  CV: negative for chest pain, palpitations, LEE  Respiratory: negative for SOB, DOE, PND, orthopnea  GI: negative for nausea, vomiting, abdominal pain    OBJECTIVE:: There were no vitals taken for this visit.  General Appearance: healthy, alert, no distress, pleasant affect, cooperative.  Heart:  normal rate and regular rhythm, no murmurs, clicks, or gallops.  Lungs: clear to auscultation.  Abdomen: BS normal.  Abdomen soft, non-tender.  No masses or organomegaly.  Extremities:  no edema.    Labs and chart reviewed.    A/P:  There are no diagnoses linked to this encounter.    Follow-up in {NUMBERS 1-12:10} {TIME FRAME:9076}.  Barriers to learning assessed: None.  Patient verbalizes understanding and is agreeable to above plan.

## 2018-08-20 NOTE — Patient Instructions (Addendum)
1) Covid neg instructions  2) Home isolation can be discontinued after these three things are met:  - fever free for 72 hours (without the use of anti-pyretics)  - all symptoms have resolved  - at least 7 days have passed since onset of symptoms    3) Call if increase or persistence sx

## 2018-08-20 NOTE — Progress Notes (Signed)
---------------------(data below generated by Gita Kudo, MD)--------------------    Patient Verification & Telemedicine Consent:    I am proceeding with this evaluation at the direct request of the patient.  I have verified this is the correct patient and have obtained verbal consent and written consent from the patient/ surrogate to perform this voluntary telemedicine evaluation (including obtaining history, performing examination and reviewing data provided by the patient).   The patient/ surrogate has the right to refuse this evaluation.  I have explained risks (including potential loss of confidentiality), benefits, alternatives, and the potential need for subsequent face to face care. Patient/ surrogate understands that there is a risk of medical inaccuracies given that our recommendations will be made based on reported data (and we must therefore assume this information is accurate).  Knowing that there is a risk that this information is not reported accurately, and that the telemedicine video, audio, or data feed may be incomplete, the patient agrees to proceed with evaluation and holds [place] harmless knowing these risks. In this evaluation, we will be providing recommendations only. The patient/ surrogate has been notified that other healthcare professionals (including students, residents and Metallurgist) may be involved in this audio-video evaluation.   All laws concerning confidentiality and patient access to medical records and copies of medical records apply to telemedicine.  The patient/ surrogate has received the [place] Notice of Privacy Practices.  I have reviewed this above verification and consent paragraph with the patient/ surrogate.  If the patient is not capacitated to understand the above, and no surrogate is available, since this is not an emergency evaluation, the visit will be rescheduled until such time that the patient can consent, or the surrogate is available to  consent.    Demographics:   Medical Record #: [id number]   Date: August 20, 2018   Patient Name: Brittany [name]   DOB: 1966-02-03  Age: 53 year old  Sex: female  Location: Home address on file    Evaluator(s):   Brittany [name] was evaluated by me today.    Clinic Location: [facility]  [address]  [place] [address]  [phone]    Brittany [name] is a 53 year old female here for follow-up. Problem list was updated and reviewed today with patient:    #1: Works in shifts in Doctor, general practice at [employer]    #2: Had fever 100.6 yesterday and Tuesday and body aches and headache. Started Tuesday    #3: Went to drive thru [place] and covid testing. Is negative    #4: Off work 2 days and will start tele tomorrow and needs to isolate 7 days from start sx. Not scheduled for in person shift until 4/13    #5: Feeling better than was 2 days ago                         Patient Active Problem List    Diagnosis Date Noted   . Chronic midline low back pain without sciatica 10/17/2015   . Hyperlipidemia, unspecified hyperlipidemia type 09/08/2015   . Impingement syndrome, shoulder, right 05/02/2015   . Radial styloid tenosynovitis 05/02/2015   . Preventative health care 11/28/2014   . Allergic rhinitis due to pollen 04/11/2014   . Gastroesophageal reflux disease without esophagitis 04/11/2014   . Bilateral low back pain without sciatica 09/21/2013   . Tension headache 12/26/2011   .  Gluten intolerance 09/17/2011   . Dyspepsia 07/15/2011   . 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 Abdominal pain 03/13/2011   . Right knee pain 03/13/2011   . Routine lab draw 03/13/2011   . Hearing loss 12/14/2010   . Cerumen impaction 12/14/2010   . Pre-operative examination 12/06/2010     Brittany [name] is a 53 year old female G0P0000 with h/o menorrhagia who presents today for preop visit for hysteroscopy, D&C, possible resection of  fibroid, and Novasure ablation.     Work-up:  11/22/10: EMB benign  11/22/10 pap: negative    11/28/10 Pelvic sono:  The uterus shows several intramural fibroids, largest measuring 3.7 x 3.2 cm   containing a subcentimeter cystic degeneration, abutting the endometrium. The  endometrial stripe is 1.8-mm. Both ovaries are normal measuring 2.6 x 1.3 x   1.7 cm on the right and 2.6 x 2.7 x 2.0 cm on the left. No free fluid is   seen       . Skin lesion 12/05/2010   . Palpitations 10/02/2010   . Low back pain 06/15/2010   . Fatigue 06/15/2010   . Depression 07/25/2009   . Sebaceous cyst 03/23/2009   . Insomnia 11/16/2008   . Acquired hypothyroidism 09/22/2008   . Right foot pain 08/18/2008   . Dysmenorrhea 08/18/2008   . Grave's disease 01/15/2008   . Vitamin D deficiency 01/06/2008   . Benign neoplasm of skin, site unspecified 08/27/2007   . Cold sensitivity 08/27/2007   . Health maintenance examination 02/04/2007     Last pap 2006, has had squamous cells, repeats normal  Mammogram: 2/08, all normal  CHOL 225  HDL65  LDLCALC      143    TRIG 87  (12/15/06)  Cycles: regular q28 days, last two cycles "heavier flow" "more cramping" and 1day longer (5 instead of 4)        . Infectious mononucleosis 09/11/2006       Past medical history and family history reviewed and updated today as below.  Allergies and medications reviewed and updated as below.    Past Medical History:   Diagnosis Date   . Hypothyroidism     h/o hyperthyroidism s/p radioactive iodine   . Infectious mononucleosis 08/2006   . Insomnia    . Menorrhagia        Social History:  Social History     Tobacco Use   . Smoking status: Never Smoker   . Smokeless tobacco: Never Used   Substance Use Topics   . Alcohol use: Yes     Comment: 1 bottle wine /week   . Drug use: No     Social History     Patient does not qualify to have social determinant information on file (likely too young).   Social History Narrative    2019- Divorced and no kids. 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 Yoga andswimming/hiking       Allergies:  Allergies   Allergen Reactions   . Vicodin [Hydrocodone-Acetaminophen] Hallucinations       Current Outpatient Medications   Medication Sig   . cetirizine (ZYRTEC) 10 MG tablet Take 10 mg by mouth daily.   . clotrimazole (LOTRIMIN) 1 % cream Apply 1 Application topically daily. Apply to affected area daily.   . cyclobenzaprine (FLEXERIL) 5 MG tablet Take 1 tablet (5 mg) by mouth 3 times daily as needed for Muscle Spasms.   . diclofenac (VOLTAREN)  1 % gel Apply 4 g topically 4 times daily.   . fluticasone propionate (FLONASE) 50 MCG/ACT nasal spray Spray 1 spray into each nostril 2 times daily.   Marland Kitchen ibuprofen (MOTRIN) 800 MG tablet Take 1 tablet (800 mg) by mouth every 8 hours as needed for Mild Pain (Pain Score 1-3) or Moderate Pain (Pain Score 4-6).   Marland Kitchen levonorgestrel-ethinyl estradiol (AVIANE) 0.1-20 MG-MCG tablet Take 1 tablet by mouth daily.   . metroNIDAZOLE (METROGEL) 1 % gel Apply 1 Application topically daily. Apply to affected area daily.   . Naproxen Sodium (ALEVE PO)    . SYNTHROID 112 MCG tablet Take 1 tablet (112 mcg) by mouth every other day. Alternating with 125 mcg tablet every other day   . SYNTHROID 125 MCG tablet Take 1 tablet (125 mcg) by mouth every other day. Alternating with 112 mcg tablet every other day.   . tretinoin (RETIN-A) 0.025 % cream Apply a thin layer at bedtime as directed   . triamcinolone (KENALOG) 0.1 % ointment Apply a thin layer as directed     No current facility-administered medications for this visit.        ROS:    Constitutional: see HPI  CV: negative for chest pain, palpitations, LEE  Respiratory: see HPI  GI: negative for nausea, vomiting, abdominal pain    Labs and chart reviewed.    Results for LATINA, FRANK (MRN [id number]) as of 08/20/2018 12:51   Ref. Range 08/19/2018 09:22   COVID-19 Coronavirus Detection Unknown Not Detected       A/P:  Elzena was seen today for  results.Results for JOVONNE, WILTON (MRN [id number]) as of 08/20/2018 12:51   Ref. Range 08/19/2018 09:22   COVID-19 Coronavirus Detection Unknown Not Detected       Sx fever and myalgias and at risk exposure with job. Was Covid tested and negative.    Recommendations include: those already given yesterday by NP and see on wrap -up. Overall feeling better than with onset 2 days ago and would give time.    Avoid tylenol/NSAID to better assess ? fever        Diagnoses and all orders for this visit:    Fever, unspecified fever cause    Myalgia    Covid negative-see wrap up    Follow-up if sx persist. Understands self confinement and parameters to return to work  Barriers to learning assessed: None.  Patient verbalizes understanding and is agreeable to above plan.

## 2018-08-23 ENCOUNTER — Encounter (INDEPENDENT_AMBULATORY_CARE_PROVIDER_SITE_OTHER): Payer: Self-pay | Admitting: Physician Assistant

## 2018-08-23 ENCOUNTER — Emergency Department (INDEPENDENT_AMBULATORY_CARE_PROVIDER_SITE_OTHER): Payer: BLUE CROSS/BLUE SHIELD | Admitting: Physician Assistant

## 2018-08-23 VITALS — BP 113/58 | HR 93 | Temp 98.3°F | Resp 16

## 2018-08-23 DIAGNOSIS — B349 Viral infection, unspecified: Secondary | ICD-10-CM

## 2018-08-23 MED ORDER — ACETAMINOPHEN 325 MG PO TABS: 650.00 mg | ORAL_TABLET | ORAL | Status: AC | PRN

## 2018-08-23 NOTE — Patient Instructions (Signed)
Isolation information for Patients  (updated 08/14/2018)    Your health care provider will evaluate whether you can be cared for at home. If it is determined that you do not need hospitalization and can be isolated at home, you will be monitored by your health care provider.     You should follow the prevention steps below and even if your test is negative we  follow these guidelines including waiting to leave home until you are no longer contagious as per instructions at end of this document.    Contact our dedicated nurse line [phone] if you are noting worsening respiratory symptoms and need advice. This line is open 8 am -5 pm 7 days a week.  If sudden and severe worsening in symptoms please call 911 and let them know you are being tested or are COVID-19 positive.        Stay home except to get medical care  . You should restrict activities outside your home, except for getting medical care. Do not go to work, school, or public areas. Avoid using public transportation, ride-sharing, or taxis.  . If you have a medical appointment, call the healthcare provider and tell them that you have or may have COVID-19. This will help the healthcare provider's office take steps to keep other people from getting infected or exposed.    Separate yourself from other people and animals in your home  . People: As much as possible, stay in a specific room and away from other people in your home. If available, you should use a separate bathroom.  . Animals: Restrict contact with pets and other animals while you are sick with COVID-19, just like you would around other people. Avoid petting, snuggling, being kissed or licked, and sharing food with pets. When possible, have another member of your household care for your animals while you are sick. If you must care for your pet or be around animals while you are sick, wash your hands before and after you interact with them and wear a facemask.     Wear a facemask   . You should  wear a facemask when you are around other people (e.g., sharing a room or vehicle) or pets and before you enter a health care provider's office. If you are unable to wear a facemask (for example, because it causes trouble breathing), then people who live with you should not stay in the same room with you, or they should wear a facemask if they enter your room.    Cover your coughs and sneezes  . Cover your mouth and nose with a tissue when you cough or sneeze. Throw used tissues in a lined trash can; immediately wash your hands with soap and water for at least 20 seconds or clean your hands with an alcohol-based hand sanitizer that contains 60 to 95% alcohol, covering all surfaces of your hands and rubbing them together until they feel dry. Soap and water should be used preferentially if hands are visibly dirty.    Clean your hands often  . Wash your hands often with soap and water for at least 20 seconds or clean your hands with an alcohol-based hand sanitizer that contains 60 to 95% alcohol, covering all surfaces of your hands and rubbing them together until they feel dry. Soap and water should be used preferentially if hands are visibly dirty. Avoid touching your eyes, nose, and mouth with unwashed hands.    Avoid sharing personal household items  . You should   not share dishes, drinking glasses, cups, eating utensils, towels, or bedding with other people or pets in your home. After using these items, they should be washed thoroughly with soap and water.    Clean all "high-touch" surfaces everyday  . High touch surfaces include counters, tabletops, doorknobs, bathroom fixtures, toilets, phones, keyboards, tablets, and bedside tables. Also, clean any surfaces that may have blood, stool, or bodily fluids on them. Use a household cleaning spray or wipe, according to the label instructions. Labels contain instructions for safe and effective use of the cleaning product including precautions you should take when  applying the product, such as wearing gloves and making sure you have good ventilation during use of the product.    Monitor your symptoms  . Seek prompt medical attention if your illness is worsening (e.g., difficulty breathing). Before seeking care, call your health care provider and tell them that you have, or are being evaluated for, COVID-19. Put on a facemask if you have one before you enter the facility. These steps will help the health care provider's office to keep other people in the office or waiting room from being infected or exposed.   Persons who are placed under active monitoring or facilitated self-monitoring should follow instructions provided by their local health department or occupational health professionals, as appropriate.  . If you have a medical emergency and need to call 911, notify the dispatch personnel that you have, or are being evaluated for COVID-19. If possible, put on a facemask before emergency medical services arrive.    Discontinuing home isolation  Patients with confirmed COVID-19 or with respiratory symptoms and a negative COVID-19 test should remain under home isolation precautions until the following three things have happened:    1. You have had no fever for at least 72 hours (that is three full days of no fever without the use medicine that reduces fevers)  AND  2. Other symptoms have improved (for example, when your cough or shortness of breath have improved)  AND  3. at least 7 days have passed since your symptoms first appeared      Please go to https://www.cdc.gov/coronavirus/2019-ncov/if-you-are-sick/index.html for additional information.

## 2018-08-23 NOTE — Progress Notes (Signed)
CC:   Chief Complaint   Patient presents with   . Fever- 9 Weeks To 74 Years     x 5 days, 101F this morning, tested NEG for covid already   . Headache- New Onset Or New Symptoms   . Generalized Body Aches   . Abdominal Pain       Case Summary: Brittany [name] is a 53 year old female presenting with URI sx x 5 days.   Sx include + low grade HA, body aches, fatigue, low appetite, fevers.  Tmax 101 F at home taken orally this a.m., no antipyretics taken prior to arrival in clinic.  Denies cough, dyspnea, wheezing or SOB.   Was tested for COVID 19 3 days ago, results negative. She returns today because she thought she had a high fever at home.  Works at [employer] data [employer] at [employer] prior to onset.  Patient denies hemoptysis, chest pain, dizziness, syncope, lethargy, vomiting, GIB.  Tolerating fluids at home.  No h/o asthma/pulmonary disease.  Not immunocompromised.    Past Medical History:   Diagnosis Date   . Hypothyroidism     h/o hyperthyroidism s/p radioactive iodine   . Infectious mononucleosis 08/2006   . Insomnia    . Menorrhagia        Past Surgical History:   Procedure Laterality Date   . left wrist tendon repair  1998   . CHOLECYSTECTOMY, LAP  1995   . PB REMOVE TONSILS/ADENOIDS,<12 Y/O  1975   . PB MYOMECTOMY 1-4 MYOMAS 250 GM/< VAGINAL APPR     . wisdom teeth         Social History     Socioeconomic History   . Marital status: Divorced     Spouse name: Not on file   . Number of children: Not on file   . Years of education: Not on file   . Highest education level: Not on file   Occupational History   . Occupation: Office manager   Social Needs   . Financial resource strain: Not on file   . Food insecurity:     Worry: Not on file     Inability: Not on file   . Transportation needs:     Medical: Not on file     Non-medical: Not on file   Tobacco Use   . Smoking status: Never Smoker   . Smokeless tobacco: Never Used   Substance and Sexual Activity   . Alcohol use: Yes     Comment: 1 bottle wine /week    . Drug use: No   . Sexual activity: Not Currently   Lifestyle   . Physical activity:     Days per week: Not on file     Minutes per session: Not on file   . Stress: Not on file   Relationships   . Social connections:     Talks on phone: Not on file     Gets together: Not on file     Attends religious service: Not on file     Active member of club or organization: Not on file     Attends meetings of clubs or organizations: Not on file     Relationship status: Not on file   . Intimate partner violence:     Fear of current or ex partner: Not on file     Emotionally abused: Not on file     Physically abused: Not on file     Forced  sexual activity: Not on file   Other Topics Concern   . Not on file   Social History Narrative    2019- Divorced and no kids. [place] and [employer] and communication. Lives on own and social theatre/culture. Yoga andswimming/hiking       Family History   Problem Relation Name Age of Onset   . Breast Cancer Mother  24   . Other Sister          Uterine fibroids/hysterectomy   . Crohn's Disease Sister     . Breast Cancer Sister  83   . Ovarian Cancer Neg Hx          but sister w/ovarian cysts age 91       Medications:    Current Outpatient Medications   Medication Sig   . acetaminophen (TYLENOL) 325 MG tablet Take 650 mg by mouth every 4 hours as needed for Mild Pain (Pain Score 1-3).   . cetirizine (ZYRTEC) 10 MG tablet Take 10 mg by mouth daily.   . clotrimazole (LOTRIMIN) 1 % cream Apply 1 Application topically daily. Apply to affected area daily.   . cyclobenzaprine (FLEXERIL) 5 MG tablet Take 1 tablet (5 mg) by mouth 3 times daily as needed for Muscle Spasms.   . diclofenac (VOLTAREN) 1 % gel Apply 4 g topically 4 times daily.   . fluticasone propionate (FLONASE) 50 MCG/ACT nasal spray Spray 1 spray into each nostril 2 times daily.   Marland Kitchen levonorgestrel-ethinyl estradiol (AVIANE) 0.1-20 MG-MCG tablet Take 1 tablet by mouth daily.   . metroNIDAZOLE (METROGEL) 1 % gel Apply 1 Application  topically daily. Apply to affected area daily.   Marland Kitchen SYNTHROID 112 MCG tablet Take 1 tablet (112 mcg) by mouth every other day. Alternating with 125 mcg tablet every other day   . SYNTHROID 125 MCG tablet Take 1 tablet (125 mcg) by mouth every other day. Alternating with 112 mcg tablet every other day.   . tretinoin (RETIN-A) 0.025 % cream Apply a thin layer at bedtime as directed   . triamcinolone (KENALOG) 0.1 % ointment Apply a thin layer as directed     No current facility-administered medications for this visit.        Review of Systems:  10 point review of systems performed and negative unless otherwise stated above.    Physical Exam:      08/23/18  0956   BP: 113/58   Pulse: 93   Resp: 16   Temp: 98.3 F (36.8 C)   SpO2: 99%     VS noted from triage.  GEN:  Nontoxic appearing, speaking in full sentences, alert.  HEENT: NCAT. No conjunctival injection. Tolerating secretions.  CHEST/RESP: Good expansions without retractions or accessory muscle use.  Unlabored.  CV:  No edema.    EXT: MAEW  NEURO: Nonfocal, lucid, normal mentation  SKIN: Normal color without rashes, warm, dry    Diagnostic Studies:      MDM: 53 year old female presents with URI sx x 5 days, already tested negative for COVID-19.  No known exposures, no recent travel.  Not immunocompromised.  Tolerating fluids at home.  Denies chest pain, vomiting, hemoptysis, syncope.  Overall pt is nontoxic appearing, presenting with stable VS, afebrile, no signs of respiratory distress.  Neuro nonfocal.  COVID testing not repeated.  Patient is advised to continue distancing and adhere to isolation precautions until sx resolve, regardless of negative test result. She is not due back to work until 4/13.  No signs of ARDS.  Doubt sepsis, pna, strep, flu.      Impression:     ICD-10-CM ICD-9-CM    1. Viral illness B34.9 079.99          Plan:  Tylenol advised, symptomatic treatment  Social distancing, isolation for at least 5-7 days beyond resolution of  symptoms  Rest, gentle hydration  ER precautions discussed in length for worsening SOB, high fever, confusion or other concerns  Follow up with PCP or specialist as discussed      Amada Kingfisher PA  Case discussed with my attending MD Dr. Ulice Brilliant.

## 2018-08-23 NOTE — Interdisciplinary (Signed)
Intake completed over the phone

## 2018-08-23 NOTE — Interdisciplinary (Signed)
Pt roomed, confirmed DOB, vitals/intake completed

## 2018-09-21 ENCOUNTER — Telehealth (INDEPENDENT_AMBULATORY_CARE_PROVIDER_SITE_OTHER): Payer: Self-pay | Admitting: Internal Medicine

## 2018-09-21 NOTE — Telephone Encounter (Signed)
Fax Received    Date Received: 09/18/18    Date Processed: 09/21/18    From: PRN    Document Type: PT plan of care dated 09/18/18-10/29/18 received @ 4:58pm; located in physician's "LIM to Print" folder ending in 80. Please send to "DONE FOLDER" after printing.      Outside fax, please review.  Thank you.

## 2018-09-21 NOTE — Telephone Encounter (Signed)
Forms printed

## 2018-09-22 NOTE — Telephone Encounter (Signed)
Forms received, prepped, and placed in nurses box for md signature

## 2018-09-25 NOTE — Telephone Encounter (Signed)
Signed plan of care scanned into pt's chart and faxed as requested to:  [facility]- [facility]  [place], [place] [address]  [place]  [place]  Tax ID # [id number]  NPI # [id number]

## 2018-10-04 ENCOUNTER — Telehealth (INDEPENDENT_AMBULATORY_CARE_PROVIDER_SITE_OTHER): Payer: Self-pay | Admitting: Family

## 2018-10-04 DIAGNOSIS — Z1159 Encounter for screening for other viral diseases: Secondary | ICD-10-CM

## 2018-10-06 ENCOUNTER — Other Ambulatory Visit: Payer: Self-pay

## 2018-10-07 ENCOUNTER — Telehealth (INDEPENDENT_AMBULATORY_CARE_PROVIDER_SITE_OTHER): Payer: Self-pay | Admitting: Public Health & General Preventive Medicine

## 2018-10-07 ENCOUNTER — Ambulatory Visit (INDEPENDENT_AMBULATORY_CARE_PROVIDER_SITE_OTHER): Payer: Self-pay

## 2018-10-07 DIAGNOSIS — Z1159 Encounter for screening for other viral diseases: Secondary | ICD-10-CM

## 2018-10-07 LAB — SARS-COV-2 IGG: SARS-CoV-2 IgG: NONREACTIVE

## 2018-10-07 NOTE — Interdisciplinary (Signed)
Venipuncture performed per order. Patient confirmed their name and date of birth. Requisition order and specimen labels were verified.  Patient tolerated procedure well.     Obtained nasal specimen from patient per order and submitted to lab. Patient notified results will be given within 24-72 hrs via MyChart.     (Documenting on behalf of  RN/LVN/MA/CPT1 who performed the swab/lab draw)

## 2018-10-07 NOTE — Telephone Encounter (Signed)
Employee    Covid PCR: Pending    Covid Serology Negative    Lab Results   Component Value Date    SARSCOV2IGG Non Reactive 10/07/2018         Please let patient know result.

## 2018-10-08 LAB — COVID-19 CORONAVIRUS DETECTION ASSAY AT ~~LOC~~ LAB: COVID-19 Coronavirus Result: NOT DETECTED

## 2018-10-09 NOTE — Telephone Encounter (Signed)
Pt has reviewed lab results; detailed instructions have been sent via MyChart.   No further action needed.

## 2018-10-28 ENCOUNTER — Telehealth (INDEPENDENT_AMBULATORY_CARE_PROVIDER_SITE_OTHER): Payer: Self-pay | Admitting: Internal Medicine

## 2018-10-28 NOTE — Telephone Encounter (Signed)
POC received, prepped, and placed in nurses box for md review/signature.

## 2018-10-28 NOTE — Telephone Encounter (Signed)
Fax Received    Date Received: 10/27/18    Date Processed: 10/28/18    From: [place]    Document Type: Plan of Care received 10/27/18 @ 5:13pm; located in physician's "LIM to Print" folder ending in 66. Please send to "DONE FOLDER" after printing.

## 2018-10-28 NOTE — Telephone Encounter (Signed)
Forms printed

## 2018-10-30 NOTE — Telephone Encounter (Signed)
Placed in PCP's inbox to review and sign.

## 2018-11-03 NOTE — Telephone Encounter (Signed)
POC received. POC faxed back as requested to:    [facility]- [facility]  [place], [place] [address]  [facility] [place] [phone]  Tax ID # [id number]  NPI # [id number]

## 2018-11-22 ENCOUNTER — Telehealth (INDEPENDENT_AMBULATORY_CARE_PROVIDER_SITE_OTHER): Payer: Self-pay | Admitting: Family

## 2018-11-22 ENCOUNTER — Ambulatory Visit (INDEPENDENT_AMBULATORY_CARE_PROVIDER_SITE_OTHER): Payer: BLUE CROSS/BLUE SHIELD

## 2018-11-22 DIAGNOSIS — Z1159 Encounter for screening for other viral diseases: Secondary | ICD-10-CM

## 2018-11-22 LAB — EMMI, COVID-19: EMMI Video Order Number: 16678178399

## 2018-11-22 LAB — COVID-19 CORONAVIRUS DETECTION ASSAY AT ~~LOC~~ LAB: COVID-19 Coronavirus Result: NOT DETECTED

## 2018-11-22 NOTE — Telephone Encounter (Signed)
COVID PCR ordered .

## 2018-11-22 NOTE — Patient Instructions (Signed)
•Isolation information for Patients  (updated 09/21/2018)    Your health care provider will evaluate whether you can be cared for at home. If it is determined that you do not need hospitalization and can be isolated at home, you will be monitored by your health care provider.     You should follow the prevention steps below and even if your test is negative follow these guidelines including waiting to leave home until you are no longer contagious as per instructions at end of this document. Please follow any additional instructions for return to your workplace if you are an essential worker.    Contact our dedicated nurse line [phone] if you are noting worsening respiratory symptoms and need advice. This line is open 8 am -5 pm 7 days a week.  If sudden and severe worsening in symptoms please call 911 and let them know you are being tested or are COVID-19 positive.        Stay home except to get medical care  Trainer has a Health order for quarantine and it is a misdemeanor if you are not following:  https://www.[place].gov/content/dam/sdc/hhsa/programs/phs/Epidemiology/covid19/HealthOfficerOrder-Isolation.pdf  • You should do no activities outside your home, except for getting medical care. Do not go to work, school, or public areas. Do not use public transportation, ride-sharing, or taxis.  • If you have a medical appointment, call the healthcare provider and tell them that you have or may have COVID-19. This will help the healthcare provider’s office take steps to keep other people from getting infected or exposed.  ; Have all essential items (eg groceries) delivered to your home and left at your doorstep. If you need assistance with this, call 211 to learn about available services.    Separate yourself from other people and animals in your home  • People: As much as possible, stay in a specific room and away from other people in your home. If available, you should use a separate  bathroom.  • Animals: Restrict contact with pets and other animals while you are sick with COVID-19, just like you would around other people. Avoid petting, snuggling, being kissed or licked, and sharing food with pets. When possible, have another member of your household care for your animals while you are sick. If you must care for your pet or be around animals while you are sick, wash your hands before and after you interact with them and wear a facemask.     Notify contacts of your illness if you test positive:  • Because people infected with COVID can spread the illness before they develop symptoms, you need to make a list of all people you’ve been in contact with from 48 hours prior to your first symptom through until you started home isolation.  • Contact the people on this list and inform them of your COVID19 diagnosis or potential diagnosis if you have symptoms but are not tested.   • Inform all of your contacts that they need to quarantine themselves in their homes for 14 days from the last point of contact.  They may leave their homes only to get medical care.    • If any of your contacts are an essential worker, they should contact their employer about their return to work policy.  If their employer does not have a policy, they may follow the CDC guidelines for exposed essential workers:  https://www.cdc.gov/coronavirus/2019-ncov/community/critical-workers/implementing-safety-practices.html     Wear a facemask   • You should wear a facemask when you are   around other people (e.g., sharing a room or vehicle) or pets and before you enter a health care provider’s office. If you are unable to wear a facemask (for example, because it causes trouble breathing), then people who live with you should not stay in the same room with you, or they should wear a facemask if they enter your room.    Cover your coughs and sneezes  • Cover your mouth and nose with a tissue when you cough or sneeze. Throw used tissues in a  lined trash can; immediately wash your hands with soap and water for at least 20 seconds or clean your hands with an alcohol-based hand sanitizer that contains 60 to 95% alcohol, covering all surfaces of your hands and rubbing them together until they feel dry. Soap and water should be used preferentially if hands are visibly dirty.    Clean your hands often  • Wash your hands often with soap and water for at least 20 seconds or clean your hands with an alcohol-based hand sanitizer that contains 60 to 95% alcohol, covering all surfaces of your hands and rubbing them together until they feel dry. Soap and water should be used preferentially if hands are visibly dirty. Avoid touching your eyes, nose, and mouth with unwashed hands.    Avoid sharing personal household items  • You should not share dishes, drinking glasses, cups, eating utensils, towels, or bedding with other people or pets in your home. After using these items, they should be washed thoroughly with soap and water.    Clean all “high-touch” surfaces everyday  • High touch surfaces include counters, tabletops, doorknobs, bathroom fixtures, toilets, phones, keyboards, tablets, and bedside tables. Also, clean any surfaces that may have blood, stool, or bodily fluids on them. Use a household cleaning spray or wipe, according to the label instructions. Labels contain instructions for safe and effective use of the cleaning product including precautions you should take when applying the product, such as wearing gloves and making sure you have good ventilation during use of the product.    Monitor your symptoms  • Seek prompt medical attention if your illness is worsening (e.g., difficulty breathing). Before seeking care, call your health care provider and tell them that you have, or are being evaluated for, COVID-19. Put on a facemask if you have one before you enter the facility. These steps will help the health care provider’s office to keep other people in  the office or waiting room from being infected or exposed.   Persons who are placed under active monitoring or facilitated self-monitoring should follow instructions provided by their local health department or occupational health professionals, as appropriate.  • If you have a medical emergency and need to call 911, notify the dispatch personnel that you have, or are being evaluated for COVID-19. If possible, put on a facemask before emergency medical services arrive.    Discontinuing home isolation  Patients with confirmed COVID-19 or with respiratory symptoms and a negative COVID-19 test should remain under home isolation precautions until the following three things have happened:    1. You have had no fever for at least 72 hours (that is three full days of no fever without the use medicine that reduces fevers)  AND  2. Other symptoms have improved (for example, when your cough or shortness of breath have improved)  AND  3. At least 10 days have passed since your symptoms first appeared    If you are an essential worker,   please contact your employer after you meet the above criteria to discuss their specific return to work policy. Please abide by their policies.    Please go to https://www.cdc.gov/coronavirus/2019-ncov/if-you-are-sick/index.html for additional information.

## 2018-11-22 NOTE — Interdisciplinary (Signed)
Obtained nasal specimen from patient per order and submitted to lab.Patient confirmed their name and date of birth. Requisition order and specimen labels were verified.  Patient tolerated procedure well.Patient notified results will be given within 24-72 hrs via MyChart.     (Documenting on behalf of Ian [place], MA who performed the swab)    Shakisha Abend LVN

## 2018-11-23 NOTE — Telephone Encounter (Signed)
Lab Results   Component Value Date    CO19 Not Detected 11/22/2018    CO19 Not Detected 10/07/2018    CO19 Not Detected 08/19/2018     Symptomatic health employee    PCR negative  Please communicate results to patient.

## 2018-11-25 ENCOUNTER — Other Ambulatory Visit: Payer: Self-pay

## 2018-11-27 ENCOUNTER — Ambulatory Visit
Admission: RE | Admit: 2018-11-27 | Discharge: 2018-11-27 | Disposition: A | Discharge status: Home / Self Care | Payer: BLUE CROSS/BLUE SHIELD | Attending: Neuroradiology | Admitting: Neuroradiology

## 2018-11-27 DIAGNOSIS — Z1239 Encounter for other screening for malignant neoplasm of breast: Secondary | ICD-10-CM

## 2018-11-27 DIAGNOSIS — Z1231 Encounter for screening mammogram for malignant neoplasm of breast: Secondary | ICD-10-CM

## 2018-12-23 ENCOUNTER — Other Ambulatory Visit: Payer: Self-pay

## 2018-12-29 ENCOUNTER — Ambulatory Visit (INDEPENDENT_AMBULATORY_CARE_PROVIDER_SITE_OTHER): Payer: BLUE CROSS/BLUE SHIELD

## 2018-12-29 DIAGNOSIS — Z1159 Encounter for screening for other viral diseases: Secondary | ICD-10-CM

## 2018-12-29 LAB — COVID-19 DETECTION ASSAY ASYM HEALTH EMP: COVID-19 Coronavirus Result: NOT DETECTED

## 2018-12-29 NOTE — Patient Instructions (Signed)
Isolation and Result Information for [facility]  (updated 12/27/2018)    PLEASE KEEP THIS DOCUMENT UNTIL YOU RECEIVE YOUR RESULTS    RESULT INFORMATION:  If your COVID-19 Coronavirus Assay swab is resulted as Detected (Positive) you will receive a call from a Provider or Team that ordered your test, you will receive follow up monitoring for 10 days.     If your COVID-19 Coronavirus Assay swab is resulted as Not Detected (Negative) this suggests that the collected specimen from your nose did not have genetic material consistent with COVID-19. There is a small chance that the test could be falsely negative. Your negative results will be released only to MyChart, if you do not have MyChart we will communicate the results to you by telephone.     More information:  Your health care provider will evaluate whether you can be cared for at home. If it is determined that you do not need hospitalization and can be isolated at home, you will be monitored by your health care provider.      You should follow the prevention steps below and even if your test is negative follow these guidelines including waiting to leave home until you are no longer contagious as per instructions at end of this document.     Contact our dedicated nurse line [phone] if you are noting worsening respiratory symptoms and need advice. This line is open 8 am -5 pm 7 days a week.  If sudden and severe worsening in symptoms please call 911 and let them know you are being tested or are COVID-19 positive.         Stay home except to get medical care  [place] has a Health order for quarantine and it is a misdemeanor if you are not following:  https://www.[place].gov/content/dam/sdc/hhsa/programs/phs/Epidemiology/covid19/HealthOfficerOrder-Isolation.pdf  · You should do no activities outside your home, except for getting medical care. Do not go to work, school, or public areas. Do not use public transportation, ride-sharing, or  taxis.  · If you have a medical appointment, call the healthcare provider and tell them that you have or may have COVID-19. This will help the healthcare provider's office take steps to keep other people from getting infected or exposed.  · Have all essential items (e.g. groceries) delivered to your home and left at your doorstep. If you need assistance with this, call 211 to learn about available services.     Separate yourself from other people and animals in your home  · People: As much as possible, stay in a specific room and away from other people in your home. If available, you should use a separate bathroom.  · Animals: Restrict contact with pets and other animals while you are sick with COVID-19, just like you would around other people. Avoid petting, snuggling, being kissed or licked, and sharing food with pets. When possible, have another member of your household care for your animals while you are sick. If you must care for your pet or be around animals while you are sick, wash your hands before and after you interact with them and wear a facemask.      Notify contacts of your illness if you test positive:  · Because people infected with COVID can spread the illness before they develop symptoms, you need to make a list of all people you've been in contact with from 48 hours prior to your first symptom through until you started home isolation.  · Contact the people on   this list and inform them of your COVID19 diagnosis or potential diagnosis if you have symptoms but are not tested.   · Inform all of your contacts that they need to quarantine themselves in their homes for 14 days from the last point of contact.  They may leave their homes only to get medical care.    · If any of your contacts are an essential worker, they should contact their employer about their return to work policy.  If their employer does not have a policy, they may follow the CDC guidelines for exposed essential workers:   https://www.cdc.gov/coronavirus/2019-ncov/community/critical-workers/implementing-safety-practices.html      Wear a facemask   · You should wear a facemask when you are around other people (e.g., sharing a room or vehicle) or pets and before you enter a health care provider's office. If you are unable to wear a facemask (for example, because it causes trouble breathing), then people who live with you should not stay in the same room with you, or they should wear a facemask if they enter your room.     Cover your coughs and sneezes  · Cover your mouth and nose with a tissue when you cough or sneeze. Throw used tissues in a lined trash can; immediately wash your hands with soap and water for at least 20 seconds or clean your hands with an alcohol-based hand sanitizer that contains 60 to 95% alcohol, covering all surfaces of your hands and rubbing them together until they feel dry. Soap and water should be used preferentially if hands are visibly dirty.     Clean your hands often  · Wash your hands often with soap and water for at least 20 seconds or clean your hands with an alcohol-based hand sanitizer that contains 60 to 95% alcohol, covering all surfaces of your hands and rubbing them together until they feel dry. Soap and water should be used preferentially if hands are visibly dirty. Avoid touching your eyes, nose, and mouth with unwashed hands.     Avoid sharing personal household items  · You should not share dishes, drinking glasses, cups, eating utensils, towels, or bedding with other people or pets in your home. After using these items, they should be washed thoroughly with soap and water.     Clean all “high-touch” surfaces everyday  · High touch surfaces include counters, tabletops, doorknobs, bathroom fixtures, toilets, phones, keyboards, tablets, and bedside tables. Also, clean any surfaces that may have blood, stool, or bodily fluids on them. Use a household cleaning spray or wipe, according to the label  instructions. Labels contain instructions for safe and effective use of the cleaning product including precautions you should take when applying the product, such as wearing gloves and making sure you have good ventilation during use of the product.     Monitor your symptoms  · Seek prompt medical attention if your illness is worsening (e.g., difficulty breathing). Before seeking care, call your health care provider and tell them that you have, or are being evaluated for, COVID-19. Put on a facemask if you have one before you enter the facility. These steps will help the health care provider's office to keep other people in the office or waiting room from being infected or exposed.   Persons who are placed under active monitoring or facilitated self-monitoring should follow instructions provided by their local health department or occupational health professionals, as appropriate.  · If you have a medical emergency and need to call 911, notify the dispatch   personnel that you have, or are being evaluated for COVID-19. If possible, put on a facemask before emergency medical services arrive.     Discontinuing home isolation**  Patients with confirmed COVID-19 or with respiratory symptoms and a negative COVID-19 test should remain under home isolation precautions until the following three things have happened:     • You have had no fever for at least 72 hours (that is three full days of no fever without the use medicine that reduces fevers)  AND  • Other symptoms have improved (for example, when your cough or shortness of breath have improved)  AND  • At least 10 days have passed since your symptoms first appeared     • You should continue to use personal protective equipment.   • If you have any symptoms or questions please contact your Health Care Provider or Primary Care Physician for further guidance.  • Patients established with [facility] PCP please contact your PCP   • For those that do not have a [facility] PCP but would  like additional guidance from [facility] please schedule an express care video visit by contacting [phone] [email or web address]    **For symptomatic [employer] employee please follow up with supervisor for return to work information. Please go to [email or web address] for additional information.     Please go to https://www.cdc.gov/coronavirus/2019-ncov/if-you-are-sick/index.html for additional information.    If you are an essential worker, please contact your employer after you meet the above criteria to discuss their specific return to work policy. Please abide by their policies.     Information for [facility] Students:  • If your results are negative, they will automatically be released to MyStudentChart.  • If your result is positive you will be called by a Student Health Provider and they will provide clinical guidance and initiate contact tracing.  • All [facility] Students will be provided daily medical checks until they are fully recovered  •  students living on campus with a positive test will be provided isolation housing on campus including daily meal support.  • Questions for  students: Please call [phone] to connect to an Advice Nurse both during clinic and after hours,or send a message to Ask-A-Nurse in MyStudentChart if you have questions

## 2018-12-29 NOTE — Interdisciplinary (Signed)
Verified patient with two identifiers.   Armen Celis, MA obtained nasal specimen from patient per order and submitted to lab for processing. Patient tolerated well.   Patient was given self-care isolation instructions to follow while waiting for results.   Negative results will be MyCharted to patient. Positive results will be called to patient.     -Ellise Kovack, LVN

## 2019-01-12 ENCOUNTER — Ambulatory Visit: Payer: BLUE CROSS/BLUE SHIELD | Attending: Family

## 2019-01-12 ENCOUNTER — Telehealth (INDEPENDENT_AMBULATORY_CARE_PROVIDER_SITE_OTHER): Payer: Self-pay | Admitting: Family

## 2019-01-12 DIAGNOSIS — Z1159 Encounter for screening for other viral diseases: Secondary | ICD-10-CM

## 2019-01-12 LAB — COVID-19 CORONAVIRUS DETECTION ASSAY AT ~~LOC~~ LAB: COVID-19 Coronavirus Result: NOT DETECTED

## 2019-01-12 NOTE — Patient Instructions (Signed)
Isolation and Result Information for [facility]  (updated 12/27/2018)    PLEASE KEEP THIS DOCUMENT UNTIL YOU RECEIVE YOUR RESULTS    RESULT INFORMATION:  If your COVID-19 Coronavirus Assay swab is resulted as Detected (Positive) you will receive a call from a Provider or Team that ordered your test, you will receive follow up monitoring for 10 days.     If your COVID-19 Coronavirus Assay swab is resulted as Not Detected (Negative) this suggests that the collected specimen from your nose did not have genetic material consistent with COVID-19. There is a small chance that the test could be falsely negative. Your negative results will be released only to MyChart, if you do not have MyChart we will communicate the results to you by telephone.     More information:  Your health care provider will evaluate whether you can be cared for at home. If it is determined that you do not need hospitalization and can be isolated at home, you will be monitored by your health care provider.      You should follow the prevention steps below and even if your test is negative follow these guidelines including waiting to leave home until you are no longer contagious as per instructions at end of this document.     Contact our dedicated nurse line [phone] if you are noting worsening respiratory symptoms and need advice. This line is open 8 am -5 pm 7 days a week.  If sudden and severe worsening in symptoms please call 911 and let them know you are being tested or are COVID-19 positive.         Stay home except to get medical care  [place] has a Health order for quarantine and it is a misdemeanor if you are not following:  https://www.[place].gov/content/dam/sdc/hhsa/programs/phs/Epidemiology/covid19/HealthOfficerOrder-Isolation.pdf  · You should do no activities outside your home, except for getting medical care. Do not go to work, school, or public areas. Do not use public transportation, ride-sharing, or  taxis.  · If you have a medical appointment, call the healthcare provider and tell them that you have or may have COVID-19. This will help the healthcare provider's office take steps to keep other people from getting infected or exposed.  · Have all essential items (e.g. groceries) delivered to your home and left at your doorstep. If you need assistance with this, call 211 to learn about available services.     Separate yourself from other people and animals in your home  · People: As much as possible, stay in a specific room and away from other people in your home. If available, you should use a separate bathroom.  · Animals: Restrict contact with pets and other animals while you are sick with COVID-19, just like you would around other people. Avoid petting, snuggling, being kissed or licked, and sharing food with pets. When possible, have another member of your household care for your animals while you are sick. If you must care for your pet or be around animals while you are sick, wash your hands before and after you interact with them and wear a facemask.      Notify contacts of your illness if you test positive:  · Because people infected with COVID can spread the illness before they develop symptoms, you need to make a list of all people you've been in contact with from 48 hours prior to your first symptom through until you started home isolation.  · Contact the people on   this list and inform them of your COVID19 diagnosis or potential diagnosis if you have symptoms but are not tested.   · Inform all of your contacts that they need to quarantine themselves in their homes for 14 days from the last point of contact.  They may leave their homes only to get medical care.    · If any of your contacts are an essential worker, they should contact their employer about their return to work policy.  If their employer does not have a policy, they may follow the CDC guidelines for exposed essential workers:   https://www.cdc.gov/coronavirus/2019-ncov/community/critical-workers/implementing-safety-practices.html      Wear a facemask   · You should wear a facemask when you are around other people (e.g., sharing a room or vehicle) or pets and before you enter a health care provider's office. If you are unable to wear a facemask (for example, because it causes trouble breathing), then people who live with you should not stay in the same room with you, or they should wear a facemask if they enter your room.     Cover your coughs and sneezes  · Cover your mouth and nose with a tissue when you cough or sneeze. Throw used tissues in a lined trash can; immediately wash your hands with soap and water for at least 20 seconds or clean your hands with an alcohol-based hand sanitizer that contains 60 to 95% alcohol, covering all surfaces of your hands and rubbing them together until they feel dry. Soap and water should be used preferentially if hands are visibly dirty.     Clean your hands often  · Wash your hands often with soap and water for at least 20 seconds or clean your hands with an alcohol-based hand sanitizer that contains 60 to 95% alcohol, covering all surfaces of your hands and rubbing them together until they feel dry. Soap and water should be used preferentially if hands are visibly dirty. Avoid touching your eyes, nose, and mouth with unwashed hands.     Avoid sharing personal household items  · You should not share dishes, drinking glasses, cups, eating utensils, towels, or bedding with other people or pets in your home. After using these items, they should be washed thoroughly with soap and water.     Clean all “high-touch” surfaces everyday  · High touch surfaces include counters, tabletops, doorknobs, bathroom fixtures, toilets, phones, keyboards, tablets, and bedside tables. Also, clean any surfaces that may have blood, stool, or bodily fluids on them. Use a household cleaning spray or wipe, according to the label  instructions. Labels contain instructions for safe and effective use of the cleaning product including precautions you should take when applying the product, such as wearing gloves and making sure you have good ventilation during use of the product.     Monitor your symptoms  · Seek prompt medical attention if your illness is worsening (e.g., difficulty breathing). Before seeking care, call your health care provider and tell them that you have, or are being evaluated for, COVID-19. Put on a facemask if you have one before you enter the facility. These steps will help the health care provider's office to keep other people in the office or waiting room from being infected or exposed.   Persons who are placed under active monitoring or facilitated self-monitoring should follow instructions provided by their local health department or occupational health professionals, as appropriate.  · If you have a medical emergency and need to call 911, notify the dispatch   personnel that you have, or are being evaluated for COVID-19. If possible, put on a facemask before emergency medical services arrive.     Discontinuing home isolation**  Patients with confirmed COVID-19 or with respiratory symptoms and a negative COVID-19 test should remain under home isolation precautions until the following three things have happened:     • You have had no fever for at least 72 hours (that is three full days of no fever without the use medicine that reduces fevers)  AND  • Other symptoms have improved (for example, when your cough or shortness of breath have improved)  AND  • At least 10 days have passed since your symptoms first appeared     • You should continue to use personal protective equipment.   • If you have any symptoms or questions please contact your Health Care Provider or Primary Care Physician for further guidance.  • Patients established with [facility] PCP please contact your PCP   • For those that do not have a [facility] PCP but would  like additional guidance from [facility] please schedule an express care video visit by contacting [phone] [email or web address]    **For symptomatic [employer] employee please follow up with supervisor for return to work information. Please go to [email or web address] for additional information.     Please go to https://www.cdc.gov/coronavirus/2019-ncov/if-you-are-sick/index.html for additional information.    If you are an essential worker, please contact your employer after you meet the above criteria to discuss their specific return to work policy. Please abide by their policies.     Information for [facility] Students:  • If your results are negative, they will automatically be released to MyStudentChart.  • If your result is positive you will be called by a Student Health Provider and they will provide clinical guidance and initiate contact tracing.  • All [facility] Students will be provided daily medical checks until they are fully recovered  • [facility] students living on campus with a positive test will be provided isolation housing on campus including daily meal support.  • Questions for [place] students: Please call [phone] to connect to an Advice Nurse both during clinic and after hours,or send a message to Ask-A-Nurse in MyStudentChart if you have questions

## 2019-01-12 NOTE — Interdisciplinary (Signed)
Armando Camarena LVN Verified Patient with two identifiers.  Obtained nasal specimen from patient and submitted to lab. Patient given self care islation instructions to follow while waiting for test results.

## 2019-01-13 NOTE — Progress Notes (Signed)
YOUR RESULT IS NEGATIVE (RELEASED TO MYCHART)

## 2019-02-02 ENCOUNTER — Encounter (INDEPENDENT_AMBULATORY_CARE_PROVIDER_SITE_OTHER): Payer: Self-pay | Admitting: Hospital

## 2019-02-03 ENCOUNTER — Telehealth (INDEPENDENT_AMBULATORY_CARE_PROVIDER_SITE_OTHER): Payer: Self-pay | Admitting: Internal Medicine

## 2019-02-03 ENCOUNTER — Encounter (INDEPENDENT_AMBULATORY_CARE_PROVIDER_SITE_OTHER): Payer: Self-pay | Admitting: Internal Medicine

## 2019-02-03 DIAGNOSIS — E039 Hypothyroidism, unspecified: Secondary | ICD-10-CM

## 2019-02-03 DIAGNOSIS — Z0000 Encounter for general adult medical examination without abnormal findings: Secondary | ICD-10-CM

## 2019-02-03 NOTE — Telephone Encounter (Signed)
From: [name]  To: Gita Kudo, MD  Sent: 02/03/2019 6:25 AM PDT  Subject: 20-Other    Hello,  Just submitted a medication renewal request for my 2 doses of synthroid.  Thank you,  Dyann Ruddle

## 2019-02-03 NOTE — Telephone Encounter (Signed)
Patient is requesting refill of medication, please see orders for preloaded prescription refill.    Requested Prescriptions     Pending Prescriptions Disp Refills   . levothyroxine (SYNTHROID) 112 MCG tablet 45 tablet 2   . levothyroxine (SYNTHROID) 125 MCG tablet 45 tablet 2     Pended TSH, please sign, if appropriate.    LOV: 08/20/2018  NOV: Visit date not found  Last RX fill: 02/03/2018

## 2019-02-04 MED ORDER — LEVOTHYROXINE SODIUM 125 MCG OR TABS
125.0000 ug | ORAL_TABLET | ORAL | 0 refills | Status: DC
Start: 2019-02-04 — End: 2019-04-25

## 2019-02-04 MED ORDER — LEVOTHYROXINE SODIUM 112 MCG OR TABS
112.0000 ug | ORAL_TABLET | ORAL | 0 refills | Status: DC
Start: 2019-02-04 — End: 2019-04-25

## 2019-02-04 NOTE — Telephone Encounter (Signed)
MyChart message sent to patient.

## 2019-02-04 NOTE — Telephone Encounter (Signed)
Scheduling Request:   [facility]     Please remind pt to schedule f/u appt w Dr Laurance Flatten, Marisue Ivan, for 70yr f/u active issues and labs (video visit if appropriate), was due on or after 12/04/18.     Authorized 90 days supply + 0 RF until f/u scheduled.         Thanks,    [facility]   Refill and Prior [employer]  Phone:  [phone]  Ext:  [phone]

## 2019-02-04 NOTE — Telephone Encounter (Signed)
Hypothyroid Medication Refill Protocol    Last visit: 08/20/2018  Fever;  12/03/17 CDM  Next scheduled appointment: none      **If no recent dose changes, and last TSH (and T4 for Liothyronine only) normal/within date, no recent notes will be reviewed per Pharmacy Dept protocol**      LABS required:  (Q year TSH)  *If recent dose change: check TSH 2 months after starting new  dose.    Lab Results   Component Value Date    TSH 1.12 08/20/2017    FREET4 1.82 (H) 11/25/2014         Monitoring required:  (Q year BP, HR, Wt)   *If pt is pregnant, DEFER TO MD*    (BP range: 0000000  [id number])  Blood Pressure   08/23/18 113/58   02/05/18 113/60   01/08/18 112/68       (HR range: 55-110)   Pulse Readings from Last 3 Encounters:   08/23/18 93   02/05/18 94   01/08/18 78       Wt Readings from Last 3 Encounters:   01/08/18 86.6 kg (191 lb)   12/03/17 86.6 kg (191 lb)   11/24/17 84.4 kg (186 lb)           Hypothyroid Medication Refill Protocol      **If no recent dose changes, and last TSH (and T4 for Liothyronine only) normal/within date, no recent notes will be reviewed per Pharmacy Dept protocol**      LABS required:  (Q year TSH)  *If recent dose change: check TSH 2 months after starting new  dose.    Lab Results   Component Value Date    TSH 1.12 08/20/2017    FREET4 1.82 (H) 11/25/2014         Monitoring required:  (Q year BP, HR, Wt)   *If pt is pregnant, DEFER TO MD*    (BP range: 0000000  [id number])  Blood Pressure   08/23/18 113/58   02/05/18 113/60   01/08/18 112/68       (HR range: 55-110)   Pulse Readings from Last 3 Encounters:   08/23/18 93   02/05/18 94   01/08/18 78       Wt Readings from Last 3 Encounters:   01/08/18 86.6 kg (191 lb)   12/03/17 86.6 kg (191 lb)   11/24/17 84.4 kg (186 lb)

## 2019-02-09 ENCOUNTER — Other Ambulatory Visit: Payer: Self-pay

## 2019-02-13 ENCOUNTER — Encounter (INDEPENDENT_AMBULATORY_CARE_PROVIDER_SITE_OTHER): Payer: Self-pay | Admitting: Internal Medicine

## 2019-02-13 ENCOUNTER — Ambulatory Visit (INDEPENDENT_AMBULATORY_CARE_PROVIDER_SITE_OTHER): Payer: BLUE CROSS/BLUE SHIELD | Admitting: Internal Medicine

## 2019-02-13 VITALS — BP 90/50 | HR 72 | Temp 99.0°F | Wt 186.0 lb

## 2019-02-13 DIAGNOSIS — Z1211 Encounter for screening for malignant neoplasm of colon: Secondary | ICD-10-CM

## 2019-02-13 DIAGNOSIS — Z0000 Encounter for general adult medical examination without abnormal findings: Secondary | ICD-10-CM

## 2019-02-13 DIAGNOSIS — Z23 Encounter for immunization: Secondary | ICD-10-CM

## 2019-02-13 DIAGNOSIS — N926 Irregular menstruation, unspecified: Secondary | ICD-10-CM

## 2019-02-13 NOTE — Progress Notes (Signed)
Chief Complaint   Patient presents with   . Thyroid Problem       SUBJECTIVE::Brittany [name] is a 53 year old female here for follow-up. Problem list was updated and reviewed today with patient:    #1: Has not been in in a while.    #2: Meds reviewed and wants to continue HRT    #3: Due for fasting labs and will also do [place] though on low hormone and nl value likely not helpful    She is asking for this as wants to be viligant re: her risk endometrial with sister's hx.    Do not know that "hormone" levels helpful    #4: Will refer to GYN to discuss    #5: Last Pap 2017 and neg HPV so due 2022    #6: Previously referred for colonoscopy and never did-will refer and stressed again    Patient Active Problem List    Diagnosis Date Noted   . Chronic midline low back pain without sciatica 10/17/2015   . Hyperlipidemia, unspecified hyperlipidemia type 09/08/2015   . Impingement syndrome, shoulder, right 05/02/2015   . Radial styloid tenosynovitis 05/02/2015   . Preventative health care 11/28/2014   . Allergic rhinitis due to pollen 04/11/2014   . Gastroesophageal reflux disease without esophagitis 04/11/2014   . Bilateral low back pain without sciatica 09/21/2013   . Tension headache 12/26/2011   . Gluten intolerance 09/17/2011   . Dyspepsia 07/15/2011   . 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 Abdominal pain 03/13/2011   . Right knee pain 03/13/2011   . Routine lab draw 03/13/2011   . Hearing loss 12/14/2010   . Cerumen impaction 12/14/2010   . Pre-operative examination 12/06/2010     Brittany [name] is a 53 year old female G0P0000 with h/o menorrhagia who presents today for preop visit for hysteroscopy, D&C, possible resection of fibroid, and Novasure ablation.     Work-up:  11/22/10: EMB benign  11/22/10 pap: negative    11/28/10 Pelvic sono:  The uterus shows several intramural fibroids, largest measuring 3.7 x 3.2 cm   containing a subcentimeter cystic degeneration, abutting the endometrium. The  endometrial stripe  is 1.8-mm. Both ovaries are normal measuring 2.6 x 1.3 x   1.7 cm on the right and 2.6 x 2.7 x 2.0 cm on the left. No free fluid is   seen       . Skin lesion 12/05/2010   . Palpitations 10/02/2010   . Low back pain 06/15/2010   . Fatigue 06/15/2010   . Depression 07/25/2009   . Sebaceous cyst 03/23/2009   . Insomnia 11/16/2008   . Acquired hypothyroidism 09/22/2008   . Right foot pain 08/18/2008   . Dysmenorrhea 08/18/2008   . Grave's disease 01/15/2008   . Vitamin D deficiency 01/06/2008   . Benign neoplasm of skin, site unspecified 08/27/2007   . Cold sensitivity 08/27/2007   . Health maintenance examination 02/04/2007     Last pap 2006, has had squamous cells, repeats normal  Mammogram: 2/08, all normal  CHOL 225  HDL65  LDLCALC      143    TRIG 87  (12/15/06)  Cycles: regular q28 days, last two cycles "heavier flow" "more cramping" and 1day longer (5 instead of 4)        . Infectious mononucleosis 09/11/2006       Past medical history and family history reviewed and updated today as below.  Allergies and medications reviewed and  updated as below.    Past Medical History:   Diagnosis Date   . Hypothyroidism     h/o hyperthyroidism s/p radioactive iodine   . Infectious mononucleosis 08/2006   . Insomnia    . Menorrhagia        Social History:  Social History     Tobacco Use   . Smoking status: Never Smoker   . Smokeless tobacco: Never Used   Substance Use Topics   . Alcohol use: Yes     Comment: 1 bottle wine /week   . Drug use: No     Social History     Social History Narrative    2019- Divorced and no kids. [employer] and [employer] and communication. Lives on own and social theatre/culture. Yoga and swimming/hiking        2020: Sister died of endometrial cancer at 50 yo. Mother still alive in [place]-assisted living       Allergies:  Allergies   Allergen Reactions   . Vicodin [Hydrocodone-Acetaminophen] Hallucinations       Current Outpatient Medications   Medication Sig   . acetaminophen (TYLENOL) 325 MG tablet Take  650 mg by mouth every 4 hours as needed for Mild Pain (Pain Score 1-3).   . cetirizine (ZYRTEC) 10 MG tablet Take 10 mg by mouth daily.   . clotrimazole (LOTRIMIN) 1 % cream Apply 1 Application topically daily. Apply to affected area daily.   . cyclobenzaprine (FLEXERIL) 5 MG tablet Take 1 tablet (5 mg) by mouth 3 times daily as needed for Muscle Spasms.   . diclofenac (VOLTAREN) 1 % gel Apply 4 g topically 4 times daily.   . fluticasone propionate (FLONASE) 50 MCG/ACT nasal spray Spray 1 spray into each nostril 2 times daily.   Marland Kitchen levonorgestrel-ethinyl estradiol (AVIANE) 0.1-20 MG-MCG tablet Take 1 tablet by mouth daily.   Marland Kitchen levothyroxine (SYNTHROID) 112 MCG tablet Take 1 tablet (112 mcg) by mouth every other day. Alternating with 125 mcg tablet every other day   . levothyroxine (SYNTHROID) 125 MCG tablet Take 1 tablet (125 mcg) by mouth every other day. Alternating with 112 mcg tablet every other day   . metroNIDAZOLE (METROGEL) 1 % gel Apply 1 Application topically daily. Apply to affected area daily.   Marland Kitchen tretinoin (RETIN-A) 0.025 % cream Apply a thin layer at bedtime as directed     No current facility-administered medications for this visit.        ROS:    Constitutional: negative for fever, chills, weight loss or weight gain  CV: negative for chest pain, palpitations, LEE  Respiratory: negative for SOB, DOE, PND, orthopnea  GI: negative for nausea, vomiting, abdominal pain    OBJECTIVE:: BP 90/50 (BP Location: Left arm, BP Patient Position: Sitting, BP cuff size: Regular)   Pulse 72   Temp 99 F (37.2 C) (Temporal)   Wt 84.4 kg (186 lb)   SpO2 99%   BMI 27.47 kg/m   General Appearance:  alert, no distress, pleasant affect, cooperative.  Heart:  normal rate and regular rhythm, no murmurs, clicks, or gallops  Extremities:  no edema.    Labs and chart reviewed.    Results for AFUA, BOST (MRN [id number]) as of 02/13/2019 13:38   Ref. Range 08/20/2017 08:30   Sodium Latest Ref Range: 136 - 145 mmol/L 141    Potassium Latest Ref Range: 3.5 - 5.1 mmol/L 4.2   Chloride Latest Ref Range: 98 - 107 mmol/L 104   Bicarbonate Latest  Ref Range: 22 - 29 mmol/L 26   Anion Gap Latest Ref Range: 7 - 15 mmol/L 11   BUN Latest Ref Range: 6 - 20 mg/dL 13   Creatinine Latest Ref Range: 0.51 - 0.95 mg/dL 0.82   GFR Latest Units: mL/min >60   Glucose Latest Ref Range: 70 - 99 mg/dL 94   Calcium Latest Ref Range: 8.5 - 10.6 mg/dL 9.6   Alkaline Phos Latest Ref Range: 35 - 140 U/L 41   ALT (SGPT) Latest Ref Range: 0 - 33 U/L 24   AST (SGOT) Latest Ref Range: 0 - 32 U/L 23   Bilirubin, Tot Latest Ref Range: <1.2 mg/dL 2.01 (H)   Albumin Latest Ref Range: 3.5 - 5.2 g/dL 4.3   Total Protein Latest Ref Range: 6.0 - 8.0 g/dL 6.6   Triglycerides Latest Ref Range: 10 - 170 mg/dL 103   Cholesterol Latest Ref Range: <200 mg/dL 253 (H)   HDL-Cholesterol Latest Units: mg/dL 56   Non-HDL Cholesterol Latest Units: mg/dL 197   LDL-Chol (Calc) Latest Ref Range: <160 mg/dL 176 (H)   TSH Latest Ref Range: 0.27 - 4.20 uIU/mL 1.12       A/P:  Brittany [name] was seen today for thyroid problem.    Diagnoses and all orders for this visit:    Need for vaccination  -     Flu Vaccine (Fluarix, Fluzone), IM, 6+ mo    Laboratory examination ordered as part of a routine general medical examination  -     Glycosylated Hgb(A1C), Blood Lavender; Future  -     Comprehensive Metabolic Panel Green Plasma Separator Tube; Future  -     Lipid Panel Green Plasma Separator Tube; Future  -     CBC w/ Diff [place]ender; Future  -     TSH, Blood Green Plasma Separator Tube; Future    Preventative health care  -     [facility]    Colon cancer screening  -     GI Endoscopy Procedure Service Request (Non-GI Use Only); Future    Will do video visit f/u re: labs and other referrals    Follow-up in 2 wks  Barriers to learning assessed: None.  Patient verbalizes understanding and is agreeable to above plan.

## 2019-02-13 NOTE — Patient Instructions (Addendum)
1) Fasting labs    2) GYN    3) Flushot    4) Colonoscopy    5) Vitamin D 2000 iu per day    6) Video f/u review labs and other referrals

## 2019-02-13 NOTE — Interdisciplinary (Signed)
VIS given,informed consent received and vaccine administered as recorded.  No adverse reactions noted.

## 2019-02-19 ENCOUNTER — Telehealth (INDEPENDENT_AMBULATORY_CARE_PROVIDER_SITE_OTHER): Payer: Self-pay | Admitting: Internal Medicine

## 2019-02-19 ENCOUNTER — Other Ambulatory Visit: Payer: BLUE CROSS/BLUE SHIELD | Attending: Internal Medicine

## 2019-02-19 DIAGNOSIS — N926 Irregular menstruation, unspecified: Secondary | ICD-10-CM | POA: Insufficient documentation

## 2019-02-19 DIAGNOSIS — Z0000 Encounter for general adult medical examination without abnormal findings: Secondary | ICD-10-CM | POA: Insufficient documentation

## 2019-02-19 DIAGNOSIS — E039 Hypothyroidism, unspecified: Secondary | ICD-10-CM | POA: Insufficient documentation

## 2019-02-19 LAB — CBC WITH DIFF, BLOOD
ANC-Automated: 2.4 10*3/uL (ref 1.6–7.0)
Abs Basophils: 0 10*3/uL
Abs Eosinophils: 0.1 10*3/uL (ref 0.0–0.5)
Abs Lymphs: 2.7 10*3/uL (ref 0.8–3.1)
Abs Monos: 0.3 10*3/uL (ref 0.2–0.8)
Basophils: 1 %
Eosinophils: 1 %
Hct: 40.5 % (ref 34.0–45.0)
Hgb: 13.2 gm/dL (ref 11.2–15.7)
Lymphocytes: 49 %
MCH: 32 pg (ref 26.0–32.0)
MCHC: 32.6 g/dL (ref 32.0–36.0)
MCV: 98.3 um3 — ABNORMAL HIGH (ref 79.0–95.0)
MPV: 11.8 fL (ref 9.4–12.4)
Monocytes: 6 %
Plt Count: 200 10*3/uL (ref 140–370)
RBC: 4.12 10*6/uL (ref 3.90–5.20)
RDW: 11.9 % — ABNORMAL LOW (ref 12.0–14.0)
Segs: 43 %
WBC: 5.4 10*3/uL (ref 4.0–10.0)

## 2019-02-19 LAB — COMPREHENSIVE METABOLIC PANEL, BLOOD
ALT (SGPT): 10 U/L (ref 0–33)
AST (SGOT): 10 U/L (ref 0–32)
Albumin: 4.6 g/dL (ref 3.5–5.2)
Alkaline Phos: 36 U/L (ref 35–140)
Anion Gap: 11 mmol/L (ref 7–15)
BUN: 12 mg/dL (ref 6–20)
Bicarbonate: 24 mmol/L (ref 22–29)
Bilirubin, Tot: 2.21 mg/dL — ABNORMAL HIGH (ref <1.2)
Calcium: 9.4 mg/dL (ref 8.5–10.6)
Chloride: 104 mmol/L (ref 98–107)
Creatinine: 0.92 mg/dL (ref 0.51–0.95)
GFR: >60 mL/min
Glucose: 98 mg/dL (ref 70–99)
Potassium: 4 mmol/L (ref 3.5–5.1)
Sodium: 139 mmol/L (ref 136–145)
Total Protein: 6.9 g/dL (ref 6.0–8.0)

## 2019-02-19 LAB — LIPID(CHOL FRACT) PANEL, BLOOD
Cholesterol: 248 mg/dL — ABNORMAL HIGH (ref <200)
HDL-Cholesterol: 52 mg/dL
LDL-Chol (Calc): 172 mg/dL — ABNORMAL HIGH (ref <160)
Non-HDL Cholesterol: 196 mg/dL
Triglycerides: 122 mg/dL (ref 10–170)

## 2019-02-19 LAB — GLYCOSYLATED HGB(A1C), BLOOD: Glyco Hgb (A1C): 5.4 % (ref 4.8–5.8)

## 2019-02-19 LAB — TSH, BLOOD: TSH: 0.3 u[IU]/mL (ref 0.27–4.20)

## 2019-02-19 LAB — FOLLICLE STIMULATING HORMONE, BLOOD: FSH: 22.8 m[IU]/mL

## 2019-02-19 NOTE — Interdisciplinary (Signed)
Blood drawn from right arm with 21 gauge needle. 3 tubes taken.   Patient identity authenticated by Lisa Romero.

## 2019-02-19 NOTE — Telephone Encounter (Signed)
Forms printed and placed in MD's in-box in the tower of power.

## 2019-02-19 NOTE — Telephone Encounter (Signed)
Fax Received    Date Received: 02/18/2019    Date Processed: 02/19/2019    From: PRN    Document Type: Plan of Care-received @ 4:09 pm; located in physician's "LIM to Print" folder ending in 86. Please send to "DONE FOLDER" after printing.         Thank you.

## 2019-02-21 ENCOUNTER — Telehealth (INDEPENDENT_AMBULATORY_CARE_PROVIDER_SITE_OTHER): Payer: Self-pay

## 2019-02-21 NOTE — Telephone Encounter (Signed)
1st attempt, spoke with patient and will have to arrange for transportation.  She will callback once ready to schedule colonoscopy .

## 2019-02-24 NOTE — Telephone Encounter (Signed)
Faxed back signed paperwork.

## 2019-03-01 ENCOUNTER — Other Ambulatory Visit: Payer: Self-pay

## 2019-03-02 ENCOUNTER — Other Ambulatory Visit: Payer: BLUE CROSS/BLUE SHIELD

## 2019-03-02 DIAGNOSIS — Z1159 Encounter for screening for other viral diseases: Secondary | ICD-10-CM

## 2019-03-02 NOTE — Interdisciplinary (Signed)
Verified patient with two identifiers.   [place] exposure notification app used? No.  This person sought care for a reason other than use of the [place] exposure notification app.  Documented on behalf of Sandra.R MA   Nasal specimen obtained from both nostrils from patient per order and submitted to lab for processing.   Patient tolerated well.   Negative results will be sent via MyChart or MyStudentChart to patient.   Patient will be notified via a phone call if positive.  Self-care instructions provided in AVS.

## 2019-03-02 NOTE — Patient Instructions (Signed)
.Isolation information for Patients  (updated 02/08/2019)    Your health care provider will evaluate whether you can be cared for at home. If it is determined that you do not need hospitalization and can be isolated at home, you will be monitored by your health care provider.     You should follow the prevention steps below and even if your test is negative follow these guidelines including waiting to leave home until you are no longer contagious as per instructions at end of this document. Please follow any additional instructions for return to your workplace if you are an essential worker.    Contact our dedicated nurse line [phone] if you are noting worsening respiratory symptoms and need advice. This line is open 8 am -5 pm M-F days a week, on the weekend please call your physician or go Emergency Department.  If sudden and severe worsening in symptoms please call 911 and let them know you are being tested or are COVID-19 positive.        Stay home except to get medical care  [place] has a Health order for quarantine and it is a misdemeanor if you are not following:    https://www.[place].gov/content/dam/sdc/hhsa/programs/phs/Epidemiology/covid19/HealthOfficerOrder-Isolation.pdf  . You should do no activities outside your home, except for getting medical care. Do not go to work, school, or public areas. Do not use public transportation, ride-sharing, or taxis.  . If you have a medical appointment, call the healthcare provider and tell them that you have or may have COVID-19. This will help the healthcare provider's office take steps to keep other people from getting infected or exposed.  ; Have all essential items (eg groceries) delivered to your home and left at your doorstep. If you need assistance with this, call 211 to learn about available services.    Separate yourself from other people and animals in your home  . People: As much as possible, stay in a specific room and away from other  people in your home. If available, you should use a separate bathroom.  . Animals: Restrict contact with pets and other animals while you are sick with COVID-19, just like you would around other people. Avoid petting, snuggling, being kissed or licked, and sharing food with pets. When possible, have another member of your household care for your animals while you are sick. If you must care for your pet or be around animals while you are sick, wash your hands before and after you interact with them and wear a facemask.     Notify contacts of your illness if you test positive:  . Because people infected with COVID can spread the illness before they develop symptoms, you need to make a list of all people you've been in contact with from 48 hours prior to your first symptom through until you started home isolation.  . Contact the people on this list and inform them of your COVID19 diagnosis or potential diagnosis if you have symptoms but are not tested.   . Inform all of your contacts that they need to quarantine themselves in their homes for 14 days from the last point of contact.  They may leave their homes only to get medical care.    . If any of your contacts are an essential worker, they should contact their employer about their return to work policy.  If their employer does not have a policy, they may follow the CDC guidelines for exposed essential workers:  https://www.cdc.gov/coronavirus/2019-ncov/community/critical-workers/implementing-safety-practices.html     Wear   a facemask   . You should wear a facemask when you are around other people (e.g., sharing a room or vehicle) or pets and before you enter a health care provider's office. If you are unable to wear a facemask (for example, because it causes trouble breathing), then people who live with you should not stay in the same room with you, or they should wear a facemask if they enter your room.    Cover your coughs and sneezes  . Cover your mouth and nose  with a tissue when you cough or sneeze. Throw used tissues in a lined trash can; immediately wash your hands with soap and water for at least 20 seconds or clean your hands with an alcohol-based hand sanitizer that contains 60 to 95% alcohol, covering all surfaces of your hands and rubbing them together until they feel dry. Soap and water should be used preferentially if hands are visibly dirty.    Clean your hands often  . Wash your hands often with soap and water for at least 20 seconds or clean your hands with an alcohol-based hand sanitizer that contains 60 to 95% alcohol, covering all surfaces of your hands and rubbing them together until they feel dry. Soap and water should be used preferentially if hands are visibly dirty. Avoid touching your eyes, nose, and mouth with unwashed hands.    Avoid sharing personal household items  . You should not share dishes, drinking glasses, cups, eating utensils, towels, or bedding with other people or pets in your home. After using these items, they should be washed thoroughly with soap and water.    Clean all "high-touch" surfaces everyday  . High touch surfaces include counters, tabletops, doorknobs, bathroom fixtures, toilets, phones, keyboards, tablets, and bedside tables. Also, clean any surfaces that may have blood, stool, or bodily fluids on them. Use a household cleaning spray or wipe, according to the label instructions. Labels contain instructions for safe and effective use of the cleaning product including precautions you should take when applying the product, such as wearing gloves and making sure you have good ventilation during use of the product.    Monitor your symptoms  . Seek prompt medical attention if your illness is worsening (e.g., difficulty breathing). Before seeking care, call your health care provider and tell them that you have, or are being evaluated for, COVID-19. Put on a facemask if you have one before you enter the facility. These steps will  help the health care provider's office to keep other people in the office or waiting room from being infected or exposed.   Persons who are placed under active monitoring or facilitated self-monitoring should follow instructions provided by their local health department or occupational health professionals, as appropriate.  . If you have a medical emergency and need to call 911, notify the dispatch personnel that you have, or are being evaluated for COVID-19. If possible, put on a facemask before emergency medical services arrive.    Discontinuing home isolation  Patients with confirmed COVID-19 or with respiratory symptoms and a negative COVID-19 test should remain under home isolation precautions until the following three things have happened:    1. You have had no fever for at least 24 hours (that is one full day of no fever without the use medicine that reduces fevers)  AND  2. Other symptoms have improved (for example, when your cough or shortness of breath have improved)  AND  3. At least 10 days have passed since   your symptoms first appeared    If you are an essential worker, please contact your employer after you meet the above criteria to discuss their specific return to work policy. Please abide by their policies.    Please go to https://www.cdc.gov/coronavirus/2019-ncov/if-you-are-sick/index.html for additional information.

## 2019-03-03 LAB — COVID-19 DETECTION ASSAY ASYM CAMPUS EMP: COVID-19 Coronavirus Result: NOT DETECTED

## 2019-03-08 ENCOUNTER — Other Ambulatory Visit (INDEPENDENT_AMBULATORY_CARE_PROVIDER_SITE_OTHER): Payer: Self-pay | Admitting: Gastroenterology

## 2019-03-08 DIAGNOSIS — Z1211 Encounter for screening for malignant neoplasm of colon: Secondary | ICD-10-CM

## 2019-03-08 DIAGNOSIS — Z01818 Encounter for other preprocedural examination: Secondary | ICD-10-CM

## 2019-03-08 NOTE — Telephone Encounter (Signed)
Please pend order for Pre Procedure COVID-19 screening test. [facility] intake created. Thank you    Reviewed allergies listed in banner with the patient: yes  1. Do you have any additional known allergies to over the counter or prescription medications? No  2. Are you allergic to latex? No    Patient is scheduled for a Colonoscopy on 04/09/2019 with Dr. Chestine Spore. Please pend order for bowel prep and route to provider for review.     Patient's preferred pharmacy:     [facility] - [place], [place] - [address]  [address]  [place] [address]  Phone: [phone] Fax: [phone] Alternate Fax: [phone]

## 2019-03-08 NOTE — Telephone Encounter (Signed)
COVID 19 Pre-Op / Pre-Procedure [place] Screening test pending. Routed to provider for review.     Bowel prep request received. Allergies verified/updated, pended order for provider's approval.   Pended medication to pt's preferred pharmacy on file:     [facility] - [place], [place] - [address]  [address]  [place] [address]  Phone: [phone] Fax: [phone] Alternate Fax: [phone]

## 2019-03-11 ENCOUNTER — Other Ambulatory Visit (INDEPENDENT_AMBULATORY_CARE_PROVIDER_SITE_OTHER): Payer: Self-pay | Admitting: Obstetrics & Gynecology

## 2019-03-11 DIAGNOSIS — N920 Excessive and frequent menstruation with regular cycle: Secondary | ICD-10-CM

## 2019-03-12 MED ORDER — NA SULFATE-K SULFATE-MG SULF 17.5-3.13-1.6 GM/177ML PO SOLN
ORAL | 0 refills | Status: AC
Start: 2019-03-12 — End: ?

## 2019-03-16 NOTE — Telephone Encounter (Signed)
12/14/2015 is the last time pt was seen and no appointment scheduled at this time. Please review

## 2019-03-17 ENCOUNTER — Other Ambulatory Visit: Payer: BLUE CROSS/BLUE SHIELD

## 2019-03-17 ENCOUNTER — Other Ambulatory Visit: Payer: Self-pay

## 2019-03-17 DIAGNOSIS — Z1159 Encounter for screening for other viral diseases: Secondary | ICD-10-CM

## 2019-03-17 MED ORDER — LEVONORGESTREL-ETHINYL ESTRAD 0.1-20 MG-MCG OR TABS
1.0000 | ORAL_TABLET | Freq: Every day | ORAL | 1 refills | Status: DC
Start: 2019-03-17 — End: 2019-05-31

## 2019-03-17 NOTE — Telephone Encounter (Signed)
Rx for 6 months only sent to pharmacy.  Pt needs appt for wwe prior to additional refills.

## 2019-03-17 NOTE — Interdisciplinary (Signed)
Verified patient with two identifiers.   [place] exposure notification app used? No.  This person sought care for a reason other than use of the [place] exposure notification app.  Documented on behalf of [place] R, MA  Nasal specimen obtained from both nostrils from patient per order and submitted to lab for processing.   Patient tolerated well.   Negative results will be sent via MyChart or MyStudentChart to patient.   Patient will be notified via a phone call if positive.  Self-care instructions provided in AVS.

## 2019-03-17 NOTE — Patient Instructions (Signed)
.Isolation information for Patients  (updated 02/08/2019)    Your health care provider will evaluate whether you can be cared for at home. If it is determined that you do not need hospitalization and can be isolated at home, you will be monitored by your health care provider.     You should follow the prevention steps below and even if your test is negative follow these guidelines including waiting to leave home until you are no longer contagious as per instructions at end of this document. Please follow any additional instructions for return to your workplace if you are an essential worker.    Contact our dedicated nurse line [phone] if you are noting worsening respiratory symptoms and need advice. This line is open 8 am -5 pm M-F days a week, on the weekend please call your physician or go Emergency Department.  If sudden and severe worsening in symptoms please call 911 and let them know you are being tested or are COVID-19 positive.        Stay home except to get medical care  [place] has a Health order for quarantine and it is a misdemeanor if you are not following:    https://www.[place].gov/content/dam/sdc/hhsa/programs/phs/Epidemiology/covid19/HealthOfficerOrder-Isolation.pdf  . You should do no activities outside your home, except for getting medical care. Do not go to work, school, or public areas. Do not use public transportation, ride-sharing, or taxis.  . If you have a medical appointment, call the healthcare provider and tell them that you have or may have COVID-19. This will help the healthcare provider's office take steps to keep other people from getting infected or exposed.  ; Have all essential items (eg groceries) delivered to your home and left at your doorstep. If you need assistance with this, call 211 to learn about available services.    Separate yourself from other people and animals in your home  . People: As much as possible, stay in a specific room and away from other  people in your home. If available, you should use a separate bathroom.  . Animals: Restrict contact with pets and other animals while you are sick with COVID-19, just like you would around other people. Avoid petting, snuggling, being kissed or licked, and sharing food with pets. When possible, have another member of your household care for your animals while you are sick. If you must care for your pet or be around animals while you are sick, wash your hands before and after you interact with them and wear a facemask.     Notify contacts of your illness if you test positive:  . Because people infected with COVID can spread the illness before they develop symptoms, you need to make a list of all people you've been in contact with from 48 hours prior to your first symptom through until you started home isolation.  . Contact the people on this list and inform them of your COVID19 diagnosis or potential diagnosis if you have symptoms but are not tested.   . Inform all of your contacts that they need to quarantine themselves in their homes for 14 days from the last point of contact.  They may leave their homes only to get medical care.    . If any of your contacts are an essential worker, they should contact their employer about their return to work policy.  If their employer does not have a policy, they may follow the CDC guidelines for exposed essential workers:  https://www.cdc.gov/coronavirus/2019-ncov/community/critical-workers/implementing-safety-practices.html     Wear   a facemask   . You should wear a facemask when you are around other people (e.g., sharing a room or vehicle) or pets and before you enter a health care provider's office. If you are unable to wear a facemask (for example, because it causes trouble breathing), then people who live with you should not stay in the same room with you, or they should wear a facemask if they enter your room.    Cover your coughs and sneezes  . Cover your mouth and nose  with a tissue when you cough or sneeze. Throw used tissues in a lined trash can; immediately wash your hands with soap and water for at least 20 seconds or clean your hands with an alcohol-based hand sanitizer that contains 60 to 95% alcohol, covering all surfaces of your hands and rubbing them together until they feel dry. Soap and water should be used preferentially if hands are visibly dirty.    Clean your hands often  . Wash your hands often with soap and water for at least 20 seconds or clean your hands with an alcohol-based hand sanitizer that contains 60 to 95% alcohol, covering all surfaces of your hands and rubbing them together until they feel dry. Soap and water should be used preferentially if hands are visibly dirty. Avoid touching your eyes, nose, and mouth with unwashed hands.    Avoid sharing personal household items  . You should not share dishes, drinking glasses, cups, eating utensils, towels, or bedding with other people or pets in your home. After using these items, they should be washed thoroughly with soap and water.    Clean all "high-touch" surfaces everyday  . High touch surfaces include counters, tabletops, doorknobs, bathroom fixtures, toilets, phones, keyboards, tablets, and bedside tables. Also, clean any surfaces that may have blood, stool, or bodily fluids on them. Use a household cleaning spray or wipe, according to the label instructions. Labels contain instructions for safe and effective use of the cleaning product including precautions you should take when applying the product, such as wearing gloves and making sure you have good ventilation during use of the product.    Monitor your symptoms  . Seek prompt medical attention if your illness is worsening (e.g., difficulty breathing). Before seeking care, call your health care provider and tell them that you have, or are being evaluated for, COVID-19. Put on a facemask if you have one before you enter the facility. These steps will  help the health care provider's office to keep other people in the office or waiting room from being infected or exposed.   Persons who are placed under active monitoring or facilitated self-monitoring should follow instructions provided by their local health department or occupational health professionals, as appropriate.  . If you have a medical emergency and need to call 911, notify the dispatch personnel that you have, or are being evaluated for COVID-19. If possible, put on a facemask before emergency medical services arrive.    Discontinuing home isolation  Patients with confirmed COVID-19 or with respiratory symptoms and a negative COVID-19 test should remain under home isolation precautions until the following three things have happened:    1. You have had no fever for at least 24 hours (that is one full day of no fever without the use medicine that reduces fevers)  AND  2. Other symptoms have improved (for example, when your cough or shortness of breath have improved)  AND  3. At least 10 days have passed since   your symptoms first appeared    If you are an essential worker, please contact your employer after you meet the above criteria to discuss their specific return to work policy. Please abide by their policies.    Please go to https://www.cdc.gov/coronavirus/2019-ncov/if-you-are-sick/index.html for additional information.

## 2019-03-18 ENCOUNTER — Encounter (INDEPENDENT_AMBULATORY_CARE_PROVIDER_SITE_OTHER): Payer: Self-pay | Admitting: Internal Medicine

## 2019-03-18 LAB — COVID-19 DETECTION ASSAY ASYM CAMPUS EMP: COVID-19 Coronavirus Result: NOT DETECTED

## 2019-04-06 ENCOUNTER — Telehealth (INDEPENDENT_AMBULATORY_CARE_PROVIDER_SITE_OTHER): Payer: Self-pay | Admitting: Family

## 2019-04-06 ENCOUNTER — Other Ambulatory Visit (INDEPENDENT_AMBULATORY_CARE_PROVIDER_SITE_OTHER): Payer: BLUE CROSS/BLUE SHIELD

## 2019-04-06 DIAGNOSIS — Z1159 Encounter for screening for other viral diseases: Secondary | ICD-10-CM

## 2019-04-06 LAB — EMMI, COVID-19: EMMI Video Order Number: 16144718315

## 2019-04-07 LAB — COVID-19 DETECTION ASSAY SYM HEALTH EMP: COVID-19 Coronavirus Result: NOT DETECTED

## 2019-04-07 NOTE — Interdisciplinary (Signed)
Verified patient with two identifiers.   Seve Monette LVN obtained nasal specimen from patient per order and submitted to lab for processing. Patient tolerated well.   Patient was given self-care isolation instructions to follow while waiting for results.   Negative results will be MyCharted to patient. Positive results will be called to patient.     - Shiann Kam, LVN

## 2019-04-07 NOTE — Patient Instructions (Signed)
Isolation and Result Information for [facility] Testing  (updated 12/27/2018)    PLEASE KEEP THIS DOCUMENT UNTIL YOU RECEIVE YOUR RESULTS    RESULT INFORMATION:  If your COVID-19 Coronavirus Assay swab is resulted as Detected (Positive) you will receive a call from a Provider or Team that ordered your test, you will receive follow up monitoring for 10 days.     If your COVID-19 Coronavirus Assay swab is resulted as Not Detected (Negative) this suggests that the collected specimen from your nose did not have genetic material consistent with COVID-19. There is a small chance that the test could be falsely negative. Your negative results will be released only to MyChart, if you do not have MyChart we will communicate the results to you by telephone.     More information:  Your health care provider will evaluate whether you can be cared for at home. If it is determined that you do not need hospitalization and can be isolated at home, you will be monitored by your health care provider.      You should follow the prevention steps below and even if your test is negative follow these guidelines including waiting to leave home until you are no longer contagious as per instructions at end of this document.     Contact our dedicated nurse line [phone] if you are noting worsening respiratory symptoms and need advice. This line is open 8 am -5 pm 7 days a week.  If sudden and severe worsening in symptoms please call 911 and let them know you are being tested or are COVID-19 positive.         Stay home except to get medical care  [place] has a Health order for quarantine and it is a misdemeanor if you are not following:  https://www.[place].gov/content/dam/sdc/hhsa/programs/phs/Epidemiology/covid19/HealthOfficerOrder-Isolation.pdf  · You should do no activities outside your home, except for getting medical care. Do not go to work, school, or public areas. Do not use public transportation, ride-sharing, or  taxis.  · If you have a medical appointment, call the healthcare provider and tell them that you have or may have COVID-19. This will help the healthcare provider's office take steps to keep other people from getting infected or exposed.  · Have all essential items (e.g. groceries) delivered to your home and left at your doorstep. If you need assistance with this, call 211 to learn about available services.     Separate yourself from other people and animals in your home  · People: As much as possible, stay in a specific room and away from other people in your home. If available, you should use a separate bathroom.  · Animals: Restrict contact with pets and other animals while you are sick with COVID-19, just like you would around other people. Avoid petting, snuggling, being kissed or licked, and sharing food with pets. When possible, have another member of your household care for your animals while you are sick. If you must care for your pet or be around animals while you are sick, wash your hands before and after you interact with them and wear a facemask.      Notify contacts of your illness if you test positive:  · Because people infected with COVID can spread the illness before they develop symptoms, you need to make a list of all people you've been in contact with from 48 hours prior to your first symptom through until you started home isolation.  · Contact the people on   this list and inform them of your COVID19 diagnosis or potential diagnosis if you have symptoms but are not tested.   · Inform all of your contacts that they need to quarantine themselves in their homes for 14 days from the last point of contact.  They may leave their homes only to get medical care.    · If any of your contacts are an essential worker, they should contact their employer about their return to work policy.  If their employer does not have a policy, they may follow the CDC guidelines for exposed essential workers:   https://www.cdc.gov/coronavirus/2019-ncov/community/critical-workers/implementing-safety-practices.html      Wear a facemask   · You should wear a facemask when you are around other people (e.g., sharing a room or vehicle) or pets and before you enter a health care provider's office. If you are unable to wear a facemask (for example, because it causes trouble breathing), then people who live with you should not stay in the same room with you, or they should wear a facemask if they enter your room.     Cover your coughs and sneezes  · Cover your mouth and nose with a tissue when you cough or sneeze. Throw used tissues in a lined trash can; immediately wash your hands with soap and water for at least 20 seconds or clean your hands with an alcohol-based hand sanitizer that contains 60 to 95% alcohol, covering all surfaces of your hands and rubbing them together until they feel dry. Soap and water should be used preferentially if hands are visibly dirty.     Clean your hands often  · Wash your hands often with soap and water for at least 20 seconds or clean your hands with an alcohol-based hand sanitizer that contains 60 to 95% alcohol, covering all surfaces of your hands and rubbing them together until they feel dry. Soap and water should be used preferentially if hands are visibly dirty. Avoid touching your eyes, nose, and mouth with unwashed hands.     Avoid sharing personal household items  · You should not share dishes, drinking glasses, cups, eating utensils, towels, or bedding with other people or pets in your home. After using these items, they should be washed thoroughly with soap and water.     Clean all “high-touch” surfaces everyday  · High touch surfaces include counters, tabletops, doorknobs, bathroom fixtures, toilets, phones, keyboards, tablets, and bedside tables. Also, clean any surfaces that may have blood, stool, or bodily fluids on them. Use a household cleaning spray or wipe, according to the label  instructions. Labels contain instructions for safe and effective use of the cleaning product including precautions you should take when applying the product, such as wearing gloves and making sure you have good ventilation during use of the product.     Monitor your symptoms  · Seek prompt medical attention if your illness is worsening (e.g., difficulty breathing). Before seeking care, call your health care provider and tell them that you have, or are being evaluated for, COVID-19. Put on a facemask if you have one before you enter the facility. These steps will help the health care provider's office to keep other people in the office or waiting room from being infected or exposed.   Persons who are placed under active monitoring or facilitated self-monitoring should follow instructions provided by their local health department or occupational health professionals, as appropriate.  · If you have a medical emergency and need to call 911, notify the dispatch   personnel that you have, or are being evaluated for COVID-19. If possible, put on a facemask before emergency medical services arrive.     Discontinuing home isolation**  Patients with confirmed COVID-19 or with respiratory symptoms and a negative COVID-19 test should remain under home isolation precautions until the following three things have happened:     • You have had no fever for at least 72 hours (that is three full days of no fever without the use medicine that reduces fevers)  AND  • Other symptoms have improved (for example, when your cough or shortness of breath have improved)  AND  • At least 10 days have passed since your symptoms first appeared     • You should continue to use personal protective equipment.   • If you have any symptoms or questions please contact your Health Care Provider or Primary Care Physician for further guidance.  • Patients established with [facility] PCP please contact your PCP   • For those that do not have a [facility] PCP but would  like additional guidance from [facility] please schedule an express care video visit by contacting [phone] [email or web address]    **For symptomatic [place] employee please follow up with supervisor for return to work information. Please go to [email or web address] for additional information.     Please go to https://www.cdc.gov/coronavirus/2019-ncov/if-you-are-sick/index.html for additional information.    If you are an essential worker, please contact your employer after you meet the above criteria to discuss their specific return to work policy. Please abide by their policies.     Information for [place] Students:  • If your results are negative, they will automatically be released to MyStudentChart.  • If your result is positive you will be called by a Student Health Provider and they will provide clinical guidance and initiate contact tracing.  • All  Students will be provided daily medical checks until they are fully recovered  •  students living on campus with a positive test will be provided isolation housing on campus including daily meal support.  • Questions for  students: Please call [phone] to connect to an Advice Nurse both during clinic and after hours,or send a message to Ask-A-Nurse in MyStudentChart if you have questions

## 2019-04-18 ENCOUNTER — Other Ambulatory Visit: Payer: Self-pay

## 2019-04-20 ENCOUNTER — Telehealth (INDEPENDENT_AMBULATORY_CARE_PROVIDER_SITE_OTHER): Payer: Self-pay | Admitting: Gastroenterology

## 2019-04-20 NOTE — Telephone Encounter (Signed)
3 day pre-procedure call made. Confirmed with the patient the time, date, and location. Reminded patient that they need transportation post procedure and discussed preparation instructions. Confirmed that patient is scheduled for their COVID test on 04/22/2019. The patient had no further questions at this time. Thank you.

## 2019-04-22 ENCOUNTER — Other Ambulatory Visit (INDEPENDENT_AMBULATORY_CARE_PROVIDER_SITE_OTHER): Payer: BLUE CROSS/BLUE SHIELD

## 2019-04-22 ENCOUNTER — Other Ambulatory Visit (INDEPENDENT_AMBULATORY_CARE_PROVIDER_SITE_OTHER): Payer: BLUE CROSS/BLUE SHIELD | Attending: Gastroenterology

## 2019-04-22 DIAGNOSIS — Z01818 Encounter for other preprocedural examination: Secondary | ICD-10-CM | POA: Insufficient documentation

## 2019-04-22 DIAGNOSIS — Z1159 Encounter for screening for other viral diseases: Secondary | ICD-10-CM | POA: Insufficient documentation

## 2019-04-22 DIAGNOSIS — Z20828 Contact with and (suspected) exposure to other viral communicable diseases: Secondary | ICD-10-CM | POA: Insufficient documentation

## 2019-04-23 LAB — COVID-19 CORONAVIRUS DETECTION ASSAY AT ~~LOC~~ LAB: COVID-19 Coronavirus Result: NOT DETECTED

## 2019-04-24 ENCOUNTER — Ambulatory Visit
Admission: RE | Admit: 2019-04-24 | Discharge: 2019-04-24 | Disposition: A | Discharge status: Home / Self Care | Payer: BLUE CROSS/BLUE SHIELD | Attending: Gastroenterology | Admitting: Gastroenterology

## 2019-04-24 ENCOUNTER — Encounter (HOSPITAL_BASED_OUTPATIENT_CLINIC_OR_DEPARTMENT_OTHER): Admission: RE | Disposition: A | Discharge status: Home Health | Payer: Self-pay | Attending: Gastroenterology

## 2019-04-24 DIAGNOSIS — Z79899 Other long term (current) drug therapy: Secondary | ICD-10-CM | POA: Insufficient documentation

## 2019-04-24 DIAGNOSIS — Z885 Allergy status to narcotic agent status: Secondary | ICD-10-CM | POA: Insufficient documentation

## 2019-04-24 DIAGNOSIS — K648 Other hemorrhoids: Secondary | ICD-10-CM

## 2019-04-24 DIAGNOSIS — E039 Hypothyroidism, unspecified: Secondary | ICD-10-CM | POA: Insufficient documentation

## 2019-04-24 DIAGNOSIS — K5730 Diverticulosis of large intestine without perforation or abscess without bleeding: Secondary | ICD-10-CM | POA: Insufficient documentation

## 2019-04-24 DIAGNOSIS — Z1211 Encounter for screening for malignant neoplasm of colon: Secondary | ICD-10-CM | POA: Insufficient documentation

## 2019-04-24 SURGERY — COLONOSCOPY
Anesthesia: Moderate Sedation - by non-anesthesia staff only

## 2019-04-24 MED ORDER — SODIUM CHLORIDE 0.9 % IV SOLN
INTRAVENOUS | Status: AC | PRN
Start: 2019-04-24 — End: 2019-04-24
  Administered 2019-04-24: 11:00:00 500 mL via INTRAVENOUS

## 2019-04-24 MED ORDER — MIDAZOLAM HCL 5 MG/5ML IJ SOLN
INTRAMUSCULAR | Status: DC | PRN
Start: 2019-04-24 — End: 2019-04-24
  Administered 2019-04-24 (×2): 2 mg via INTRAVENOUS
  Administered 2019-04-24: 1 mg via INTRAVENOUS

## 2019-04-24 MED ORDER — MIDAZOLAM HCL 5 MG/5ML IJ SOLN
INTRAMUSCULAR | Status: AC
Start: 2019-04-24 — End: 2019-04-24
  Filled 2019-04-24: qty 5

## 2019-04-24 MED ORDER — FENTANYL CITRATE (PF) 100 MCG/2ML IJ SOLN
INTRAMUSCULAR | Status: DC | PRN
Start: 2019-04-24 — End: 2019-04-24
  Administered 2019-04-24: 25 ug via INTRAVENOUS
  Administered 2019-04-24: 50 ug via INTRAVENOUS

## 2019-04-24 MED ORDER — FENTANYL CITRATE (PF) 100 MCG/2ML IJ SOLN
INTRAMUSCULAR | Status: AC
Start: 2019-04-24 — End: 2019-04-24
  Filled 2019-04-24: qty 2

## 2019-04-24 NOTE — H&P (Signed)
HISTORY AND PHYSICAL    INDICATION FOR PROCEDURE:  Index screening for colorectal cancer, average risk.    PAST MEDICAL HISTORY:  Past Medical History:   Diagnosis Date   . Hypothyroidism     h/o hyperthyroidism s/p radioactive iodine   . Infectious mononucleosis 08/2006   . Insomnia    . Menorrhagia        PAST SURGICAL HISTORY:  Past Surgical History:   Procedure Laterality Date   . left wrist tendon repair  1998   . CHOLECYSTECTOMY, LAP  1995   . PB REMOVE TONSILS/ADENOIDS,<12 Y/O  1975   . PB MYOMECTOMY 1-4 MYOMAS 250 GM/< VAGINAL APPR     . wisdom teeth         ALLERGIES:  Vicodin [hydrocodone-acetaminophen]    MEDICATIONS:  No current facility-administered medications on file prior to encounter.      Current Outpatient Medications on File Prior to Encounter   Medication Sig Dispense Refill   . acetaminophen (TYLENOL) 325 MG tablet Take 650 mg by mouth every 4 hours as needed for Mild Pain (Pain Score 1-3).     . cetirizine (ZYRTEC) 10 MG tablet Take 10 mg by mouth daily.     . clotrimazole (LOTRIMIN) 1 % cream Apply 1 Application topically daily. Apply to affected area daily. 45 g 3   . cyclobenzaprine (FLEXERIL) 5 MG tablet Take 1 tablet (5 mg) by mouth 3 times daily as needed for Muscle Spasms. 30 tablet 0   . diclofenac (VOLTAREN) 1 % gel Apply 4 g topically 4 times daily. 1 Tube 3   . fluticasone propionate (FLONASE) 50 MCG/ACT nasal spray Spray 1 spray into each nostril 2 times daily. 1 bottle 5   . levothyroxine (SYNTHROID) 112 MCG tablet Take 1 tablet (112 mcg) by mouth every other day. Alternating with 125 mcg tablet every other day 45 tablet 0   . levothyroxine (SYNTHROID) 125 MCG tablet Take 1 tablet (125 mcg) by mouth every other day. Alternating with 112 mcg tablet every other day 45 tablet 0   . metroNIDAZOLE (METROGEL) 1 % gel Apply 1 Application topically daily. Apply to affected area daily. 1 Tube 3   . tretinoin (RETIN-A) 0.025 % cream Apply a thin layer at bedtime as directed 1 Tube 3             PHYSICAL EXAMINATION:  Vital Signs:    04/24/19  0954   BP: 124/50   Pulse: 75   Resp: 12   Temp: 97.2 F (36.2 C)   SpO2: 99%     General:   Alert, conversable, pleasant  Lungs:    No wheezing or stridor  CV:    No friction rub or mechanical click  Abdomen:   Soft, protuberant, no borborygmus    ASA Score:   1   Airway (Mallimpati) Score:  Class I - Soft palate, uvula, fauces, and pillars are visible.    ASSESSMENT AND PLAN:  Proceed to planned procedure.    The patient (or the patient's designee) has consented to the procedure, which may be done with sedation.  I have assessed the patient's status immediately prior to this procedure.  I have discussed pain management needs and options for the patient with the patient or the patient's designee.      The patient  (or the patient's designee) agrees that the patient will be full code for the duration of the procedure.    Sedation options, risks, and plans have been  discussed with the patient or the patient's designee.  Questions were answered.  The patient or the patient's designee agrees to proceed as planned.    Marvis Repress

## 2019-04-24 NOTE — Procedures (Signed)
[facility]  Gastroenterology/Special Procedures    Patient Name: Brittany [name]  Date of Birth: [birth year]  Record Number: [id number]  Date of Procedure: 04/24/2019  Referring Physician: Belenda Cruise M.D.  Endoscopist: Alvira Monday   Asst. Endoscopist:     Nurse: Kelli Hope    PROCEDURE PERFORMED  Colonoscopy    INDICATIONS FOR EXAMINATION   Index screening for colorectal cancer, average risk.     Instruments:  Olympus pediatric 180 narrow band imaging colonoscope/911  Medications: Versed 4 mg IVP, Fentanyl 100 mcg IVP  Moderate Sedation Time: 18  minutes of physician intra-service time was spent providing moderate sedation to the patient in continous face-to-face monitoring of moderate sedation with the nurse Kelli Hope  Sedation StartTime: 10:50:00 AM  Sedation   Stop Time: 11:08:00 AM      The attending physician, Dr. Oneita Jolly, was present for the entire examination.  Procedure Technique: A physical exam was performed. Informed consent was obtained from the patient (or the patient's designee) after explaining all the risks (perforation, bleeding, infection, missed lesions, and adverse effects to the medicine),   benefits, and alternatives to the procedure which the patient or designee appeared to understand and so stated.  A formal timeout was performed.  The patient was connected to the monitoring devices and placed in the left lateral position. Continuous   oxygen was provided with a nasal cannula and IV medicine administered through an indwelling cannula. After adequate sedation was achieved, a digital rectal exam was performed and the colonoscope was introduced into the rectum and advanced under direct   visualization to the terminal ileum. The scope was subsequently removed slowly while carefully examining the color, texture, anatomy, and integrity of the mucosa on the way out, with 360 degree circumferential views. Carbon dioxide gas was used for   insufflation. In the rectum, the scope was retroflexed  to evaluate for anorectal pathology such as hemorrhoids. The patient was subsequently transferred to the recovery area in satisfactory condition.  Extent of Exam: terminal ileum.  Biopsy Obtained: No.  Complications: None.   Estimated Blood Loss: None.   Need for Future Anesthesia: Moderate Sedation for future procedures.  Cecal Withdrawal Time:  Total Procedure Time:     FINDINGS  The mucosa in the terminal ileum appeared normal, without erosion or ulcer. The mucosa throughout the colon appeared shiny, with normal vascular pattern and folds. No polyps encountered on today's exam. Diverticula present throughout the colon, most   dense in the left colon. Retroflexion in the rectum revealed small internal hemorrhoids.    ENDOSCOPIC DIAGNOSIS  1. No polyps present on today's exam.  2. Pan-colonic diverticulosis, most dense in the left colon.   3. Small internal hemorrhoids.    RECOMMENDATIONS  1. Fiber rich diet, with goal 25-30 grams fiber intake per day.   2. Avoid prolonged sitting or straining with bowel movements.   3. Your next colonoscopy can take place in ten years.  4. Please follow-up with your referring provider.    Signature:_________________________________ Alvira Monday, MD              [id number])    Note:  The final official report is in the [facility] medical record.      This electronic signature authenticates all electronic and/or handwritten documentation, including orders, generated by the signer during the episode of care contained in this record.  04/24/2019 11:14:28 AM By Alvira Monday MD

## 2019-04-25 ENCOUNTER — Other Ambulatory Visit (INDEPENDENT_AMBULATORY_CARE_PROVIDER_SITE_OTHER): Payer: Self-pay | Admitting: Internal Medicine

## 2019-04-25 DIAGNOSIS — E039 Hypothyroidism, unspecified: Secondary | ICD-10-CM

## 2019-04-26 MED ORDER — LEVOTHYROXINE SODIUM 125 MCG OR TABS
125.0000 ug | ORAL_TABLET | ORAL | 3 refills | Status: DC
Start: 2019-04-26 — End: 2020-02-27

## 2019-04-26 MED ORDER — LEVOTHYROXINE SODIUM 112 MCG OR TABS
112.0000 ug | ORAL_TABLET | ORAL | 3 refills | Status: DC
Start: 2019-04-26 — End: 2020-02-27

## 2019-04-26 NOTE — Telephone Encounter (Signed)
Bayard Beaver, PHARMD, ([facility])      Hypothyroid Medication Refill Protocol    Last visit: 02/13/2019  W/ PCP re: thyroid    Next f/u appt due:  2 weeks (lab)  Next scheduled appointment: Visit date not found        **If no recent dose changes, and last TSH (and T4 for Liothyronine only) normal/within date, no recent notes will be reviewed per Pharmacy Dept protocol**      LABS required:  (Q year TSH)  *If recent dose change: check TSH 2 months after starting new  dose.    Lab Results   Component Value Date    TSH 0.30 02/19/2019    FREET4 1.82 (H) 11/25/2014         Monitoring required:  (Q year BP, HR, Wt)   *If pt is pregnant, DEFER TO MD*    (BP range: 0000000  [id number])  Blood Pressure   04/24/19 107/73   02/13/19 90/50   08/23/18 113/58       (HR range: 55-110)   Pulse Readings from Last 3 Encounters:   04/24/19 67   02/13/19 72   08/23/18 93       Wt Readings from Last 3 Encounters:   02/13/19 84.4 kg (186 lb)   01/08/18 86.6 kg (191 lb)   12/03/17 86.6 kg (191 lb)         Hypothyroid Medication Refill Protocol      **If no recent dose changes, and last TSH (and T4 for Liothyronine only) normal/within date, no recent notes will be reviewed per Pharmacy Dept protocol**      LABS required:  (Q year TSH)  *If recent dose change: check TSH 2 months after starting new  dose.    Lab Results   Component Value Date    TSH 0.30 02/19/2019    FREET4 1.82 (H) 11/25/2014         Monitoring required:  (Q year BP, HR, Wt)   *If pt is pregnant, DEFER TO MD*    (BP range: 0000000  [id number])  Blood Pressure   04/24/19 107/73   02/13/19 90/50   08/23/18 113/58       (HR range: 55-110)   Pulse Readings from Last 3 Encounters:   04/24/19 67   02/13/19 72   08/23/18 93       Wt Readings from Last 3 Encounters:   02/13/19 84.4 kg (186 lb)   01/08/18 86.6 kg (191 lb)   12/03/17 86.6 kg (191 lb)

## 2019-05-05 ENCOUNTER — Other Ambulatory Visit (HOSPITAL_BASED_OUTPATIENT_CLINIC_OR_DEPARTMENT_OTHER): Payer: Self-pay | Admitting: Family

## 2019-05-05 ENCOUNTER — Other Ambulatory Visit: Payer: BLUE CROSS/BLUE SHIELD | Attending: Family

## 2019-05-05 ENCOUNTER — Telehealth (INDEPENDENT_AMBULATORY_CARE_PROVIDER_SITE_OTHER): Payer: Self-pay | Admitting: Family

## 2019-05-05 DIAGNOSIS — Z1159 Encounter for screening for other viral diseases: Secondary | ICD-10-CM

## 2019-05-06 LAB — COVID-19 DETECTION ASSAY SYM HEALTH EMP: COVID-19 Coronavirus Result: NOT DETECTED

## 2019-05-13 ENCOUNTER — Encounter (INDEPENDENT_AMBULATORY_CARE_PROVIDER_SITE_OTHER): Payer: BLUE CROSS/BLUE SHIELD

## 2019-05-15 ENCOUNTER — Encounter: Payer: Self-pay | Admitting: Hospital

## 2019-05-15 ENCOUNTER — Encounter: Payer: Self-pay | Admitting: Internal Medicine

## 2019-05-20 ENCOUNTER — Ambulatory Visit: Payer: BLUE CROSS/BLUE SHIELD

## 2019-05-20 DIAGNOSIS — Z23 Encounter for immunization: Secondary | ICD-10-CM

## 2019-05-23 ENCOUNTER — Other Ambulatory Visit: Payer: BLUE CROSS/BLUE SHIELD

## 2019-05-23 ENCOUNTER — Other Ambulatory Visit: Payer: Self-pay

## 2019-05-23 DIAGNOSIS — Z1159 Encounter for screening for other viral diseases: Secondary | ICD-10-CM

## 2019-05-25 LAB — COVID-19 DETECTION ASSAY ASYM HEALTH EMP: COVID-19 Coronavirus Result: NOT DETECTED

## 2019-05-29 ENCOUNTER — Encounter: Payer: Self-pay | Admitting: Hospital

## 2019-05-31 ENCOUNTER — Other Ambulatory Visit: Payer: Self-pay

## 2019-05-31 ENCOUNTER — Other Ambulatory Visit (INDEPENDENT_AMBULATORY_CARE_PROVIDER_SITE_OTHER): Payer: Self-pay | Admitting: Obstetrics & Gynecology

## 2019-05-31 DIAGNOSIS — N920 Excessive and frequent menstruation with regular cycle: Secondary | ICD-10-CM

## 2019-06-01 MED ORDER — LEVONORGESTREL-ETHINYL ESTRAD 0.1-20 MG-MCG OR TABS
1.0000 | ORAL_TABLET | Freq: Every day | ORAL | 1 refills | Status: DC
Start: 2019-06-01 — End: 2019-11-29

## 2019-06-01 NOTE — Telephone Encounter (Signed)
Pt was last seen in 2017. Please review

## 2019-06-03 ENCOUNTER — Encounter: Payer: Self-pay | Admitting: Hospital

## 2019-06-04 ENCOUNTER — Other Ambulatory Visit: Payer: BLUE CROSS/BLUE SHIELD

## 2019-06-04 DIAGNOSIS — Z1159 Encounter for screening for other viral diseases: Secondary | ICD-10-CM

## 2019-06-05 LAB — COVID-19 DETECTION ASSAY ASYM HEALTH EMP: COVID-19 Coronavirus Result: NOT DETECTED

## 2019-06-08 ENCOUNTER — Encounter: Payer: Self-pay | Admitting: Hospital

## 2019-06-17 ENCOUNTER — Ambulatory Visit: Payer: BLUE CROSS/BLUE SHIELD

## 2019-06-17 DIAGNOSIS — Z23 Encounter for immunization: Secondary | ICD-10-CM

## 2019-06-21 ENCOUNTER — Other Ambulatory Visit: Payer: BLUE CROSS/BLUE SHIELD

## 2019-06-21 ENCOUNTER — Other Ambulatory Visit (INDEPENDENT_AMBULATORY_CARE_PROVIDER_SITE_OTHER): Payer: Self-pay

## 2019-06-21 DIAGNOSIS — Z1159 Encounter for screening for other viral diseases: Secondary | ICD-10-CM

## 2019-06-22 LAB — COVID-19 DETECTION ASSAY SYM HEALTH EMP: COVID-19 Coronavirus Result: NOT DETECTED

## 2019-06-23 ENCOUNTER — Encounter (INDEPENDENT_AMBULATORY_CARE_PROVIDER_SITE_OTHER): Payer: BLUE CROSS/BLUE SHIELD

## 2019-06-30 ENCOUNTER — Other Ambulatory Visit: Payer: BLUE CROSS/BLUE SHIELD

## 2019-06-30 DIAGNOSIS — Z1159 Encounter for screening for other viral diseases: Secondary | ICD-10-CM

## 2019-06-30 LAB — COVID-19 DETECTION ASSAY ASYM HEALTH EMP: COVID-19 Coronavirus Result: NOT DETECTED

## 2019-07-07 ENCOUNTER — Other Ambulatory Visit: Payer: BLUE CROSS/BLUE SHIELD

## 2019-07-07 DIAGNOSIS — Z1159 Encounter for screening for other viral diseases: Secondary | ICD-10-CM

## 2019-07-08 LAB — COVID-19 DETECTION ASSAY ASYM HEALTH EMP: COVID-19 Coronavirus Result: NOT DETECTED

## 2019-07-30 ENCOUNTER — Other Ambulatory Visit (INDEPENDENT_AMBULATORY_CARE_PROVIDER_SITE_OTHER): Payer: BLUE CROSS/BLUE SHIELD

## 2019-07-30 ENCOUNTER — Other Ambulatory Visit (INDEPENDENT_AMBULATORY_CARE_PROVIDER_SITE_OTHER): Payer: Self-pay | Admitting: Internal Medicine

## 2019-07-30 DIAGNOSIS — G8929 Other chronic pain: Secondary | ICD-10-CM

## 2019-07-30 DIAGNOSIS — Z1159 Encounter for screening for other viral diseases: Secondary | ICD-10-CM

## 2019-07-30 DIAGNOSIS — M545 Low back pain, unspecified: Secondary | ICD-10-CM

## 2019-07-30 LAB — COVID-19 DETECTION ASSAY ASYM HEALTH EMP: COVID-19 Coronavirus Result: NOT DETECTED

## 2019-08-02 NOTE — Telephone Encounter (Signed)
[facility]     cyclobenzaprine  is not currently included in the Pharmacy Refill Clinic protocols. Re-routing to the responsible staff for processing.  Thank you

## 2019-08-03 NOTE — Telephone Encounter (Signed)
Medication requested:   Requested Prescriptions     Pending Prescriptions Disp Refills   . cyclobenzaprine (FLEXERIL) 5 MG tablet 30 tablet 0     Sig: Take 1 tablet (5 mg) by mouth 3 times daily as needed for Muscle Spasms.       Date of last refill: 08/06/18    Last OV (provider):      02/13/2019  Last OV (department): 02/13/2019    Next OV (provider):      Visit date not found  Next OV (department): Visit date not found

## 2019-08-06 ENCOUNTER — Encounter (INDEPENDENT_AMBULATORY_CARE_PROVIDER_SITE_OTHER): Payer: Self-pay | Admitting: Internal Medicine

## 2019-08-06 ENCOUNTER — Encounter (INDEPENDENT_AMBULATORY_CARE_PROVIDER_SITE_OTHER): Payer: Self-pay | Admitting: Dermatology

## 2019-08-06 DIAGNOSIS — Z1283 Encounter for screening for malignant neoplasm of skin: Secondary | ICD-10-CM

## 2019-08-06 NOTE — Telephone Encounter (Signed)
From: [name]  To: Gita Kudo, MD  Sent: 08/06/2019 7:22 AM PDT  Subject: 1-Non Urgent Medical Advice    Hello Dr. Kirk Ruths,  Please renew my Rx for Voltaren and Flexiril.  Thank you.  Brittany [name]

## 2019-08-11 ENCOUNTER — Other Ambulatory Visit (INDEPENDENT_AMBULATORY_CARE_PROVIDER_SITE_OTHER): Payer: BLUE CROSS/BLUE SHIELD

## 2019-08-11 DIAGNOSIS — Z1159 Encounter for screening for other viral diseases: Secondary | ICD-10-CM

## 2019-08-12 LAB — COVID-19 DETECTION ASSAY ASYM HEALTH EMP: COVID-19 Coronavirus Result: NOT DETECTED

## 2019-09-10 ENCOUNTER — Other Ambulatory Visit (INDEPENDENT_AMBULATORY_CARE_PROVIDER_SITE_OTHER): Payer: BLUE CROSS/BLUE SHIELD

## 2019-09-10 DIAGNOSIS — Z1159 Encounter for screening for other viral diseases: Secondary | ICD-10-CM

## 2019-09-10 LAB — COVID-19 DETECTION ASSAY ASYM HEALTH EMP: COVID-19 Coronavirus Result: NOT DETECTED

## 2019-09-24 NOTE — Progress Notes (Signed)
[facility]   [place], [place] [address]  [phone]     Primary MD: Gita Kudo  Consult Requested By: Marguerite Olea    HISTORY:  Brittany [name] is a 54 year old female here for TBSE due to sun damaged skin.     LOV 11/05/2017: recommended HC for lesion on chest (thought likely arthropod bite); otherwise benign skin check.     Lesion of concern today:   1. She has 2 bright red lesions on her right arm. Reports she recently moved houses and bumped her arms.   2. There is a lesion on her right thigh she would like checked today.   3. She would like to recheck multiple nevi on her pubic area. Denies growing or changing in appearance. One of the lesions she had biopsied in the past, (path with junctional melanocytic nevus ~2007) is now starting to grow back.     Patient had both doses of the COVID-19 vaccine.     Sunburns: 1 major sunburn with peeling as a child   Sun Protection: intermittent  Melanoma RI:[id number]  NMSC RI:[id number]  Family Hx of melanoma: negative   Family hx of skin disease:positivemother had 3 NMSC (she did not use sunscreen), father with 1 NMSC    Otherwise denies other new itching, bleeding, painful, growing, ulcerating, or concerning lesions.    PMH: reviewed    Medications: reviewed    Allergies: Vicodin [hydrocodone-acetaminophen]    REVIEW OF SYSTEMS:  DERMATOLOGIC: no other skin complaints.  Constitutional: feels well, no concerns     PHYSICAL EXAM:  Skin Type:                    2  General Appearance: within normal limits  Neuro: Alert and oriented x 3  Psych: Mood and affect within normal limits  Eyes: Inspection of lids, sclera, and conjunctiva within normal limits   Cardiovascular: Inspection of peripheral vascular system showed no clubbing, cyanosis, edema  Respiratory: respiration non-labored  Areas skin examined included: scalp, forehead, eyebrows, cheeks, nose, lips, chin, ears, sclera, conjunctiva, eyelids, neck, RUE, LUE, hands, chest,  abdomen, back, RLE, LLE, feet, buttock, fingernails and toenails    Pertinent findings below:  -scattered even-bordered, even-pigmented macules and papules on the head, trunk and extremities  -feathery brown macules on the face, forearms and shoulders  -well demarcated, brown, stuck on appearing plaques on head, trunk and extremities  -scattered 2-4 mm cherry red macules  -pink brown papule with + dimple sign on the right thigh  -2 healing purpuric patches on the right arm   Dyschromia, thinning, loss of elasticity, and wrinkling of the skin in sun exposed areas    ASSESSMENT AND TREATMENT PLAN:    Multiple Benign Nevi  Lentigines  Seborrheic Keratoses  Cherry angioma  Dermatofibroma  Bruising   - benign lesions reassured    Sun damaged skin  - educational information about sun protection and skin cancers provided in handout, broad spectrum SPF 30 or higher  - RTC if notices any new or concerning growths  - Diagnoses, natural course, potential treatments and risks were discussed with the patient. Shared decision making. Questions answered.    RTC 12 months TBSE    Marguerite Olea, MD, PhD    ------------------------    COVID-19 - counseled on risks of COVID-19, social distancing, appropriate hygiene, and techniques to avoid exposure      Scribe Attestation  The notes I am recording  reflect only actions made by and judgments taken by this provider, Dr. Jerene Dilling, for whom I am scribing today. I have performed no independent clinical work.    Brea A Degasperis    ____________________________________________________________________    Provider Attestation for Scribed Note    As the attending provider, I agree with the scribed content.  Any changes or edits are noted in the text above.    Kristel Durkee

## 2019-10-01 ENCOUNTER — Ambulatory Visit (INDEPENDENT_AMBULATORY_CARE_PROVIDER_SITE_OTHER): Payer: BLUE CROSS/BLUE SHIELD | Admitting: Dermatology

## 2019-10-01 ENCOUNTER — Encounter (INDEPENDENT_AMBULATORY_CARE_PROVIDER_SITE_OTHER): Payer: Self-pay | Admitting: Dermatology

## 2019-10-01 DIAGNOSIS — D229 Melanocytic nevi, unspecified: Secondary | ICD-10-CM

## 2019-10-01 DIAGNOSIS — D239 Other benign neoplasm of skin, unspecified: Secondary | ICD-10-CM

## 2019-10-01 DIAGNOSIS — L578 Other skin changes due to chronic exposure to nonionizing radiation: Secondary | ICD-10-CM

## 2019-10-01 DIAGNOSIS — L814 Other melanin hyperpigmentation: Secondary | ICD-10-CM

## 2019-10-01 DIAGNOSIS — T148XXA Other injury of unspecified body region, initial encounter: Secondary | ICD-10-CM

## 2019-10-01 DIAGNOSIS — L821 Other seborrheic keratosis: Secondary | ICD-10-CM

## 2019-10-01 DIAGNOSIS — Z808 Family history of malignant neoplasm of other organs or systems: Secondary | ICD-10-CM

## 2019-10-01 DIAGNOSIS — D1801 Hemangioma of skin and subcutaneous tissue: Secondary | ICD-10-CM

## 2019-10-04 ENCOUNTER — Other Ambulatory Visit (INDEPENDENT_AMBULATORY_CARE_PROVIDER_SITE_OTHER): Payer: BLUE CROSS/BLUE SHIELD

## 2019-10-04 DIAGNOSIS — Z1159 Encounter for screening for other viral diseases: Secondary | ICD-10-CM

## 2019-10-04 LAB — COVID-19 DETECTION ASSAY SYM HEALTH EMP: COVID-19 Coronavirus Result: NOT DETECTED

## 2019-11-07 ENCOUNTER — Encounter (INDEPENDENT_AMBULATORY_CARE_PROVIDER_SITE_OTHER): Payer: Self-pay | Admitting: Obstetrics & Gynecology

## 2019-11-08 NOTE — Telephone Encounter (Signed)
From: [name]  To: Hinda Kehr, MD  Sent: 11/07/2019 4:12 PM PDT  Subject: 1-Non Urgent Medical Advice    Hello Dr. [name],    I stopped taking Aviane last month since I'm in menopause and haven't had a period for months. Now I have pretty extreme hot flashes. Would you prescribe Pristiq? I read that a low dose really helps. I have an appointment with you 9/14 (earliest I could schedule). If you could prescribe now that would be great. Thank you! Brittany [name]

## 2019-11-10 ENCOUNTER — Ambulatory Visit (INDEPENDENT_AMBULATORY_CARE_PROVIDER_SITE_OTHER): Payer: BLUE CROSS/BLUE SHIELD | Admitting: Nurse Practitioner

## 2019-11-10 DIAGNOSIS — M898X0 Other specified disorders of bone, multiple sites: Secondary | ICD-10-CM

## 2019-11-10 DIAGNOSIS — H6123 Impacted cerumen, bilateral: Secondary | ICD-10-CM

## 2019-11-10 NOTE — Progress Notes (Signed)
SUBJECTIVE:   Brittany [name] is a 54 year old female. She is here today for ear cleaning. Patient denies  pain and drainage. In the past the patient has required ear cleaning. Patient's surgical history on their ears is negative. Family history for ear disorders: negative  Been a couple of years since cleaning  Not surfing/swimming as much as previously    OBJECTIVE:  Physical exam revealed bilateral cerumen impaction which was cleaned under otomicroscope. Left ear intact; right ear intact. Exostoses left small ones; right exostoses 40-50%    ASSESSMENT:  Encounter Diagnoses   Name Primary?    Excessive cerumen in both ear canals Yes    Exostoses, multiple        PLAN:   Follow up: PRN Blake Divine, NP

## 2019-11-11 ENCOUNTER — Other Ambulatory Visit (INDEPENDENT_AMBULATORY_CARE_PROVIDER_SITE_OTHER): Payer: BLUE CROSS/BLUE SHIELD

## 2019-11-11 DIAGNOSIS — Z1159 Encounter for screening for other viral diseases: Secondary | ICD-10-CM

## 2019-11-12 LAB — COVID-19 DETECTION ASSAY ASYM HEALTH EMP: COVID-19 Coronavirus Result: NOT DETECTED

## 2019-11-14 ENCOUNTER — Encounter (INDEPENDENT_AMBULATORY_CARE_PROVIDER_SITE_OTHER): Payer: Self-pay | Admitting: Internal Medicine

## 2019-11-15 NOTE — Telephone Encounter (Signed)
From: [name]  To: Gita Kudo, MD  Sent: 11/14/2019 9:14 AM PDT  Subject: 20-Other    Hello,  For my medical record, I received dose 1 of the Shingrix vaccine on June 25 at [facility] on [address]. Thank you.

## 2019-11-15 NOTE — Telephone Encounter (Signed)
Information updated.Matter resolved

## 2019-11-23 ENCOUNTER — Telehealth (INDEPENDENT_AMBULATORY_CARE_PROVIDER_SITE_OTHER): Payer: Self-pay | Admitting: Internal Medicine

## 2019-11-23 ENCOUNTER — Other Ambulatory Visit: Payer: Self-pay | Admitting: Internal Medicine

## 2019-11-23 ENCOUNTER — Other Ambulatory Visit (INDEPENDENT_AMBULATORY_CARE_PROVIDER_SITE_OTHER): Payer: Self-pay | Admitting: Internal Medicine

## 2019-11-23 DIAGNOSIS — G8929 Other chronic pain: Secondary | ICD-10-CM

## 2019-11-23 DIAGNOSIS — Z1231 Encounter for screening mammogram for malignant neoplasm of breast: Secondary | ICD-10-CM

## 2019-11-23 DIAGNOSIS — M545 Low back pain, unspecified: Secondary | ICD-10-CM

## 2019-11-23 MED ORDER — CYCLOBENZAPRINE HCL 5 MG OR TABS
5.0000 mg | ORAL_TABLET | Freq: Three times a day (TID) | ORAL | 0 refills | Status: DC | PRN
Start: 2019-11-23 — End: 2020-08-21

## 2019-11-23 NOTE — Telephone Encounter (Signed)
Incoming call from patient requesting refill     Patient is re establishing care with Dr. Raquel James, soonest appt available was on 01/18/20. Pt is requesting a refill on medication     Established with: Gita Kudo   Last OV with PCP: Visit date not found   Next OV with PCP: Visit date not found   Last OV in department: Visit date not found   Next OV in department: Visit date not found    Requested Medication(s):  Requested Prescriptions     Pending Prescriptions Disp Refills    cyclobenzaprine (FLEXERIL) 5 MG tablet 30 tablet 0     Sig: Take 1 tablet (5 mg) by mouth 3 times daily as needed for Muscle Spasms.     Allergies   Allergen Reactions    Vicodin [Hydrocodone-Acetaminophen] Hallucinations        Send to:     [id number] - [place], [place] - [address]  [address]  [place] [address]  Phone: [phone] Fax: [phone] Alternate Fax: [phone]      Last labs:   Lab Results   Component Value Date    CHOL 248 (H) 02/19/2019    HDL 52 02/19/2019    LDLCALC 172 (H) 02/19/2019    TRIG 122 02/19/2019    TSH 0.30 02/19/2019    A1C 5.4 02/19/2019      Blood Pressure   04/24/19 107/73   02/13/19 90/50   08/23/18 113/58      Health Maintenance Due   Topic Date Due    PHQ9 Depression Monitoring doc flowsheet  06/11/2019        Current Medication(s):   Current Outpatient Medications   Medication Sig Dispense Refill    acetaminophen (TYLENOL) 325 MG tablet Take 650 mg by mouth every 4 hours as needed for Mild Pain (Pain Score 1-3).      cetirizine (ZYRTEC) 10 MG tablet Take 10 mg by mouth daily.      clotrimazole (LOTRIMIN) 1 % cream Apply 1 Application topically daily. Apply to affected area daily. 45 g 3    cyclobenzaprine (FLEXERIL) 5 MG tablet Take 1 tablet (5 mg) by mouth 3 times daily as needed for Muscle Spasms. 30 tablet 0    diclofenac (VOLTAREN) 1 % gel Apply 4 g topically 4 times daily. 1 Tube 3    fluticasone propionate (FLONASE) 50 MCG/ACT nasal spray Spray 1 spray into each  nostril 2 times daily. 1 bottle 5    levonorgestrel-ethinyl estradiol (AVIANE) 0.1-20 MG-MCG tablet Take 1 tablet by mouth daily. 84 tablet 1    levothyroxine (SYNTHROID) 112 MCG tablet Take 1 tablet (112 mcg) by mouth every other day. Alternating with 125 mcg tablet every other day 45 tablet 3    levothyroxine (SYNTHROID) 125 MCG tablet Take 1 tablet (125 mcg) by mouth every other day. Alternating with 112 mcg tablet every other day 45 tablet 3    metroNIDAZOLE (METROGEL) 1 % gel Apply 1 Application topically daily. Apply to affected area daily. 1 Tube 3    Na Sulfate-K Sulfate-Mg Sulf (SUPREP) bowel prep kit Take for split dose preparation per written instructions given in GI clinic. 1 kit 0    tretinoin (RETIN-A) 0.025 % cream Apply a thin layer at bedtime as directed 1 Tube 3     No current facility-administered medications for this visit.        Encounter created by Care Assist MA.  If further action required  please route encounter to appropriate in clinic MA/LVN/Resident pool.

## 2019-11-23 NOTE — Telephone Encounter (Signed)
General Inquiry     Who is calling: Incoming call from patient    Reason for this call: Patient requesting for Dr.Winsted retirement letter to be sent to her via mychart, pt expressed she was upset she did not receive a Press photographer or letter in the mail with information her PCP was leaving the office. Patient requested to re establish with an attending provider, [place] assisted patient with an appt on 01/18/20.    Action required by office: Please send patient retirement letter     Duplicate encounter? No previous documentation found on this issue.     Best way to contact: [phone]  Alternative: na    Inquiry has been read verbatim to this caller. Verbalizes satisfaction and confirms the above is accurate: yes    Has been advised this message will be transmitted to office and can expect a response within the next 24-72 hours.    Encounter created by Care Assist MA.  If further action required please route encounter to appropriate in clinic MA/LVN/Resident Pool

## 2019-11-23 NOTE — Telephone Encounter (Signed)
Medication requested:   Requested Prescriptions     Pending Prescriptions Disp Refills    cyclobenzaprine (FLEXERIL) 5 MG tablet 30 tablet 0     Sig: Take 1 tablet (5 mg) by mouth 3 times daily as needed for Muscle Spasms.       Date of last refill: 08/06/18    Last OV (provider):      02/13/19  Last OV (department): Visit date not found    Next OV (provider):      01/18/20 Dr.Abeles to establish care  Next OV (department): Visit date not found

## 2019-11-23 NOTE — Telephone Encounter (Signed)
[facility]     cyclobenzaprine  is not currently included in the Pharmacy Refill Clinic protocols. Re-routing to the responsible staff for processing.  Thank you

## 2019-11-24 ENCOUNTER — Encounter (INDEPENDENT_AMBULATORY_CARE_PROVIDER_SITE_OTHER): Payer: Self-pay | Admitting: Hospital

## 2019-11-25 ENCOUNTER — Telehealth (INDEPENDENT_AMBULATORY_CARE_PROVIDER_SITE_OTHER): Payer: Self-pay | Admitting: Obstetrics & Gynecology

## 2019-11-25 NOTE — Telephone Encounter (Signed)
Refill request received from Fax server:    Medication name and dose:levonorgestrel-ethinyl estradiol (AVIANE) 0.1-20 MG-MCG tablet     Provider who gave the medication.: Dr. [name]    Date last seen by provider: 02/06/2017    Date of next appointment: 02/01/2020    Pharmacy [place], [place] - [address]  Phone: [phone]

## 2019-11-29 ENCOUNTER — Other Ambulatory Visit (INDEPENDENT_AMBULATORY_CARE_PROVIDER_SITE_OTHER): Payer: Self-pay

## 2019-11-29 DIAGNOSIS — N920 Excessive and frequent menstruation with regular cycle: Secondary | ICD-10-CM

## 2019-11-29 MED ORDER — LEVONORGESTREL-ETHINYL ESTRAD 0.1-20 MG-MCG OR TABS
1.0000 | ORAL_TABLET | Freq: Every day | ORAL | 1 refills | Status: DC
Start: 2019-11-29 — End: 2020-01-25

## 2019-11-29 NOTE — Telephone Encounter (Signed)
Please advise if appropriate for refills.

## 2019-11-30 NOTE — Telephone Encounter (Addendum)
Tiffany can you help with this request.  Also agent scheduled an appointment to re-establish care with Dr. Raquel James on 01/18/20 on a 20 min slot.

## 2019-12-01 NOTE — Telephone Encounter (Signed)
Rx was e-rx on 11/29/2019. Please refer to encounter from 11/29/2019.

## 2019-12-02 ENCOUNTER — Encounter (INDEPENDENT_AMBULATORY_CARE_PROVIDER_SITE_OTHER): Payer: Self-pay | Admitting: Hospital

## 2019-12-03 ENCOUNTER — Encounter (INDEPENDENT_AMBULATORY_CARE_PROVIDER_SITE_OTHER): Payer: Self-pay | Admitting: Hospital

## 2019-12-03 NOTE — Telephone Encounter (Signed)
Mychart message sent to patient with attachment of Dr. Tawanna Sat departure letter.      Call center has been informed to recheck appt scheduled due to visit type and length.

## 2019-12-06 ENCOUNTER — Telehealth (INDEPENDENT_AMBULATORY_CARE_PROVIDER_SITE_OTHER): Payer: Self-pay | Admitting: Obstetrics & Gynecology

## 2019-12-06 DIAGNOSIS — N951 Menopausal and female climacteric states: Secondary | ICD-10-CM

## 2019-12-06 NOTE — Telephone Encounter (Signed)
Who is calling: Sangeeta     What are the symptoms: pt has been experiencing hot flashes -pt stopped taking her Rx Aviane  Pt stopped because she does not have her cycle anymore and she has been on the pill for decades-pt would like a different solution if possible     Pt is also calling to follow up on her request sent over via MyChart for a prescription wanted to advise that she stopped taking Aviane and would like a Rx for Pristiq    Please review mychart message 11/07/19       When did they start: 4-5 months ago         What is the patients current insurance: [employer]     Does it require an authorization to be seen:no auth needed       What is the best call back number: [phone]    Is it ok to leave a VM in the case we are unable to reach you? yes        If this is a red flag please contact the nurse immediately.

## 2019-12-06 NOTE — Telephone Encounter (Signed)
Pt has WWE with Dr. [name].    Requesting Pristiq.  Please advise.

## 2019-12-08 ENCOUNTER — Encounter (INDEPENDENT_AMBULATORY_CARE_PROVIDER_SITE_OTHER): Payer: Self-pay

## 2019-12-08 MED ORDER — DESVENLAFAXINE SUCCINATE 50 MG OR TB24
50.0000 mg | ORAL_TABLET | Freq: Every day | ORAL | 3 refills | Status: DC
Start: 2019-12-08 — End: 2020-02-01

## 2019-12-08 NOTE — Telephone Encounter (Signed)
Rx sent. Please notify pt.

## 2019-12-14 ENCOUNTER — Encounter (INDEPENDENT_AMBULATORY_CARE_PROVIDER_SITE_OTHER): Payer: Self-pay | Admitting: Internal Medicine

## 2019-12-15 ENCOUNTER — Encounter (INDEPENDENT_AMBULATORY_CARE_PROVIDER_SITE_OTHER): Payer: Self-pay | Admitting: Internal Medicine

## 2019-12-15 NOTE — Telephone Encounter (Signed)
From: [name]  To: Rod Holler Hsiu-Ts Ranae Palms, MD  Sent: 12/14/2019 5:18 PM PDT  Subject: 20-Other    Hello,  I just had my re-establishing appointment rescheduled to video on August 20 at 4:20p. Turns out I can't make that time as I'm already scheduled at 4p that day for a mammogram. If you have a 40-min video appt earlier that day, I can do that. Please call me at [phone].  Thank you.

## 2019-12-23 ENCOUNTER — Encounter: Payer: Self-pay | Admitting: Hospital

## 2019-12-23 DIAGNOSIS — Z1159 Encounter for screening for other viral diseases: Secondary | ICD-10-CM

## 2019-12-26 ENCOUNTER — Other Ambulatory Visit (INDEPENDENT_AMBULATORY_CARE_PROVIDER_SITE_OTHER): Payer: BLUE CROSS/BLUE SHIELD

## 2019-12-26 DIAGNOSIS — Z1159 Encounter for screening for other viral diseases: Secondary | ICD-10-CM

## 2019-12-27 LAB — COVID-19 DETECTION ASSAY ASYM HEALTH EMP: COVID-19 Coronavirus Result: NOT DETECTED

## 2019-12-31 ENCOUNTER — Ambulatory Visit
Admission: RE | Admit: 2019-12-31 | Discharge: 2019-12-31 | Disposition: A | Discharge status: Home / Self Care | Payer: BLUE CROSS/BLUE SHIELD | Attending: Internal Medicine | Admitting: Internal Medicine

## 2019-12-31 DIAGNOSIS — Z1231 Encounter for screening mammogram for malignant neoplasm of breast: Secondary | ICD-10-CM | POA: Insufficient documentation

## 2020-01-01 ENCOUNTER — Other Ambulatory Visit (INDEPENDENT_AMBULATORY_CARE_PROVIDER_SITE_OTHER): Payer: Self-pay | Admitting: Obstetrics & Gynecology

## 2020-01-01 ENCOUNTER — Encounter (INDEPENDENT_AMBULATORY_CARE_PROVIDER_SITE_OTHER): Payer: Self-pay

## 2020-01-01 DIAGNOSIS — N951 Menopausal and female climacteric states: Secondary | ICD-10-CM

## 2020-01-03 NOTE — Telephone Encounter (Signed)
From: [name]  To: Guadalupe Maple, RN  Sent: 01/01/2020 9:13 AM PDT  Subject: Pristiq    Hello,    Please note I need a monthly refill for Pristiq.   The Rx says only 3 refills through July 2022. [facility] should be sending a refill request soon.    Thank you,  Brittany [name]      ----- Message -----   From:Brittany Warwick Gillie Manners, RN   Sent:12/08/2019 4:23 PM PDT   SM:OLMBEM Truett Perna   Subject:Pristiq    Brittany [name],    Dr. [name] sent the Pristiq to your pharmacy.    Regards,    Information systems manager  Triage Nurse  [facility]

## 2020-01-05 ENCOUNTER — Telehealth (INDEPENDENT_AMBULATORY_CARE_PROVIDER_SITE_OTHER): Payer: Self-pay | Admitting: Internal Medicine

## 2020-01-05 DIAGNOSIS — M722 Plantar fascial fibromatosis: Secondary | ICD-10-CM

## 2020-01-05 NOTE — Telephone Encounter (Signed)
Referral Request    Name of PCP Provider: Gita Kudo     Who is requesting the referral? Incoming call from patient   What type of referral is being requested? Physical Therapy  Reason for request? Plantar fasciitis in bilateral feet since 7/25. Soonest appt pt could make was 9/7.  Has this issue been discussed with provider? no  Last office visit: Visit date not found   Next office visit: Visit date not found  Best way to contact patient: [phone]    Alternative communication method: [phone]    Full name of external provider or vendor: PRN  Specialty Type: Referral Specialties/ DME: Physical Therapy    Full Address: [address]  [place], [place] [address]  Main Phone: [phone]  Referrals Fax: [phone]    Patient's insurance is accepted? yes    Patient has been advised this message will be transmitted to office and can expect a response within the next 24-72 hours.    Encounter created by Care Assist MA.  If further action required please route encounter to appropriate in clinic MA/LVN/Resident pool.

## 2020-01-05 NOTE — Telephone Encounter (Signed)
New issue;  Needs evaluation.   Keep next available clinician

## 2020-01-07 ENCOUNTER — Ambulatory Visit (HOSPITAL_BASED_OUTPATIENT_CLINIC_OR_DEPARTMENT_OTHER): Payer: BLUE CROSS/BLUE SHIELD

## 2020-01-07 ENCOUNTER — Encounter (INDEPENDENT_AMBULATORY_CARE_PROVIDER_SITE_OTHER): Payer: BLUE CROSS/BLUE SHIELD | Admitting: Internal Medicine

## 2020-01-11 NOTE — Telephone Encounter (Signed)
Patient scheduled appointment on 01/25/2020.

## 2020-01-11 NOTE — Telephone Encounter (Signed)
Verified with pt's pharmacy that she has 2 refills left for the Pristiq

## 2020-01-15 ENCOUNTER — Other Ambulatory Visit (INDEPENDENT_AMBULATORY_CARE_PROVIDER_SITE_OTHER): Payer: BLUE CROSS/BLUE SHIELD

## 2020-01-15 DIAGNOSIS — Z1159 Encounter for screening for other viral diseases: Secondary | ICD-10-CM

## 2020-01-15 LAB — COVID-19 DETECTION ASSAY ASYM HEALTH EMP: COVID-19 Coronavirus Result: NOT DETECTED

## 2020-01-18 ENCOUNTER — Encounter (INDEPENDENT_AMBULATORY_CARE_PROVIDER_SITE_OTHER): Payer: BLUE CROSS/BLUE SHIELD | Admitting: Internal Medicine

## 2020-01-19 ENCOUNTER — Other Ambulatory Visit (INDEPENDENT_AMBULATORY_CARE_PROVIDER_SITE_OTHER): Payer: BLUE CROSS/BLUE SHIELD

## 2020-01-19 DIAGNOSIS — Z1159 Encounter for screening for other viral diseases: Secondary | ICD-10-CM

## 2020-01-19 LAB — COVID-19 DETECTION ASSAY SYM HEALTH EMP: COVID-19 Coronavirus Result: NOT DETECTED

## 2020-01-20 ENCOUNTER — Encounter (INDEPENDENT_AMBULATORY_CARE_PROVIDER_SITE_OTHER): Payer: BLUE CROSS/BLUE SHIELD

## 2020-01-20 DIAGNOSIS — Z1159 Encounter for screening for other viral diseases: Secondary | ICD-10-CM

## 2020-01-24 ENCOUNTER — Other Ambulatory Visit: Payer: Self-pay

## 2020-01-25 ENCOUNTER — Ambulatory Visit (INDEPENDENT_AMBULATORY_CARE_PROVIDER_SITE_OTHER): Payer: BLUE CROSS/BLUE SHIELD | Admitting: Student in an Organized Health Care Education/Training Program

## 2020-01-25 VITALS — BP 122/78 | HR 88 | Temp 97.4°F | Ht 69.0 in | Wt 186.0 lb

## 2020-01-25 DIAGNOSIS — M1711 Unilateral primary osteoarthritis, right knee: Secondary | ICD-10-CM

## 2020-01-25 DIAGNOSIS — F320 Major depressive disorder, single episode, mild: Secondary | ICD-10-CM

## 2020-01-25 DIAGNOSIS — Z658 Other specified problems related to psychosocial circumstances: Secondary | ICD-10-CM

## 2020-01-25 DIAGNOSIS — M722 Plantar fascial fibromatosis: Secondary | ICD-10-CM

## 2020-01-25 MED ORDER — DICLOFENAC SODIUM 1 % EX GEL
4.0000 g | Freq: Four times a day (QID) | CUTANEOUS | 3 refills | Status: DC
Start: 2020-01-25 — End: 2021-03-28
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
## 2020-01-25 NOTE — Patient Instructions (Signed)

## 2020-01-28 ENCOUNTER — Other Ambulatory Visit (INDEPENDENT_AMBULATORY_CARE_PROVIDER_SITE_OTHER): Payer: Self-pay

## 2020-01-28 ENCOUNTER — Other Ambulatory Visit (INDEPENDENT_AMBULATORY_CARE_PROVIDER_SITE_OTHER): Payer: BLUE CROSS/BLUE SHIELD | Attending: Acute Care

## 2020-01-28 DIAGNOSIS — Z1159 Encounter for screening for other viral diseases: Secondary | ICD-10-CM | POA: Insufficient documentation

## 2020-01-29 LAB — COVID-19 DETECTION ASSAY ASYM HEALTH EMP: COVID-19 Coronavirus Result: NOT DETECTED

## 2020-01-31 ENCOUNTER — Emergency Department (INDEPENDENT_AMBULATORY_CARE_PROVIDER_SITE_OTHER): Payer: BLUE CROSS/BLUE SHIELD | Admitting: Emergency Medicine

## 2020-01-31 ENCOUNTER — Encounter (INDEPENDENT_AMBULATORY_CARE_PROVIDER_SITE_OTHER): Payer: Self-pay

## 2020-01-31 VITALS — BP 149/82 | HR 98 | Temp 98.0°F | Resp 17

## 2020-01-31 DIAGNOSIS — M25561 Pain in right knee: Secondary | ICD-10-CM

## 2020-01-31 NOTE — Progress Notes (Signed)
54 year old female history of osteoarthritis and right knee presents with atraumatic right knee pain for the last few days no direct injury recalled has been ambulatory no fevers no history of crystal arthropathy  Has been using Naprosyn for pain    Allergies:  Norco  Past medical history:  Hypothyroidism, osteoarthritis right knee  Medications:  Levothyroxine  Past surgical history:  No prior knee surgery    I reviewed prior MRI 2019 right knee see below  IMPRESSION:  Longitudinal horizontal tearing of a diminutive body of the lateral meniscus extending towards the junctions of the posterior and anterior horns, with a very small superior the extrusion meniscal flap.    Tricompartmental osteoarthrosis with large areas of full-thickness cartilage loss of the lateral tibial plateau posteriorly. Additional high-grade cartilage fissuring of the posterior aspect of the lateral femoral condyle and central femoral trochlea.    Diminutive appearance of the medial meniscal body suggesting inner margin tearing.    Remote low-grade injury of fibular collateral ligament.    Popliteus tendinosis.          Specimen Collected: 01/23/18 09:18 Last Resulted: 01/23/18 09:15        Alert afebrile well-appearing  Full range of motion right hip no pain  Has full active flexion and extension right knee  No effusion no significantly increased warmth no erythema no effusion  No bony tenderness palpation patella tibia nor fibula  Normal strength in extension and flexion at the right knee  Normal plantar flexion plantar extension strength right ankle  Negative Lachman's  No ligamentous laxity of MCL nor LCL  2+ DP pulse right  Normal gait      Impression/plan  Right knee pain  -suspect flare of osteoarthritis reassuring history of physical examination I am not suspicious for an infection nor crystal arthropathy Will DC with return precautions patient is happy with plan I do not see benefit of plain imaging here today

## 2020-01-31 NOTE — Patient Instructions (Signed)
As discussed continue to use Naprosyn as needed you can also use acetaminophen/Tylenol as needed for pain.  If her symptoms significantly worsen or do not significantly improve over the next week you will need further evaluation as discussed

## 2020-02-01 ENCOUNTER — Other Ambulatory Visit: Payer: BLUE CROSS/BLUE SHIELD | Attending: Obstetrics & Gynecology | Admitting: Obstetrics & Gynecology

## 2020-02-01 ENCOUNTER — Encounter (INDEPENDENT_AMBULATORY_CARE_PROVIDER_SITE_OTHER): Payer: Self-pay | Admitting: Obstetrics & Gynecology

## 2020-02-01 VITALS — BP 133/86 | HR 82 | Temp 97.9°F | Resp 16 | Ht 69.0 in | Wt 187.4 lb

## 2020-02-01 DIAGNOSIS — Z01419 Encounter for gynecological examination (general) (routine) without abnormal findings: Secondary | ICD-10-CM

## 2020-02-01 DIAGNOSIS — N951 Menopausal and female climacteric states: Secondary | ICD-10-CM

## 2020-02-01 DIAGNOSIS — Z0000 Encounter for general adult medical examination without abnormal findings: Secondary | ICD-10-CM

## 2020-02-01 MED ORDER — DESVENLAFAXINE SUCCINATE ER 25 MG PO TB24
25.0000 mg | ORAL_TABLET | Freq: Every day | ORAL | 4 refills | Status: DC
Start: 2020-02-01 — End: 2021-02-08

## 2020-02-01 MED ORDER — DESVENLAFAXINE SUCCINATE 50 MG OR TB24
50.0000 mg | ORAL_TABLET | Freq: Every day | ORAL | 3 refills | Status: DC
Start: 2020-02-01 — End: 2020-05-29

## 2020-02-04 ENCOUNTER — Ambulatory Visit: Payer: BLUE CROSS/BLUE SHIELD

## 2020-02-04 DIAGNOSIS — Z23 Encounter for immunization: Secondary | ICD-10-CM

## 2020-02-04 LAB — HPV HIGH RISK DNA PROBE, FEMALE
HPV High Risk Genotype 16: NOT DETECTED
HPV High Risk Genotype 18: NOT DETECTED
HPV Other High Risk Genotypes (Not type 16 or 18): NOT DETECTED

## 2020-02-07 ENCOUNTER — Encounter (INDEPENDENT_AMBULATORY_CARE_PROVIDER_SITE_OTHER): Payer: Self-pay | Admitting: Obstetrics & Gynecology

## 2020-02-08 NOTE — Telephone Encounter (Signed)
From: [name]  To: Hinda Kehr, MD  Sent: 02/07/2020 11:39 PM PDT  Subject: 1-Non Urgent Medical Advice    Hello Dr. [name],    I have been taking 75mg  of Pristiq since Saturday (3 days). I have had tinnitus in my right ear for many years, but tonight noticed it is especially loud. Wondering if I should go back to just 50mg .     Brittany [name]

## 2020-02-13 ENCOUNTER — Encounter (INDEPENDENT_AMBULATORY_CARE_PROVIDER_SITE_OTHER): Payer: Self-pay | Admitting: Dermatology

## 2020-02-13 DIAGNOSIS — L710 Perioral dermatitis: Secondary | ICD-10-CM

## 2020-02-14 MED ORDER — METRONIDAZOLE 1 % EX GEL
1.0000 | Freq: Every day | CUTANEOUS | 11 refills | Status: AC
Start: 2020-02-14 — End: ?

## 2020-02-14 NOTE — Telephone Encounter (Addendum)
Pls sign pending Rx if approved  Last office visit   10/01/2019   Upcoming visit    09/29/2020  Prescription last filled   0

## 2020-02-22 ENCOUNTER — Other Ambulatory Visit (INDEPENDENT_AMBULATORY_CARE_PROVIDER_SITE_OTHER): Payer: BLUE CROSS/BLUE SHIELD

## 2020-02-22 DIAGNOSIS — Z1159 Encounter for screening for other viral diseases: Secondary | ICD-10-CM

## 2020-02-23 ENCOUNTER — Other Ambulatory Visit: Payer: Self-pay

## 2020-02-23 LAB — COVID-19 DETECTION ASSAY ASYM HEALTH EMP: COVID-19 Coronavirus Result: NOT DETECTED

## 2020-02-24 ENCOUNTER — Other Ambulatory Visit (INDEPENDENT_AMBULATORY_CARE_PROVIDER_SITE_OTHER): Payer: Self-pay | Admitting: Student in an Organized Health Care Education/Training Program

## 2020-02-24 DIAGNOSIS — M722 Plantar fascial fibromatosis: Secondary | ICD-10-CM

## 2020-02-24 NOTE — Progress Notes (Signed)
Interval History  Brittany [name] is a 54 year old female who is here for   Well woman exam.  Pt reports doing well.  Had been on low dose ocp for many years, then discontinued as now menopausal.  Started pritiq for menopausal sx.  Overall doing well on this but thinks her sx are worsening a bit and wishes try a higher dose of the medication.      VITALS: BP 133/86 (BP Location: Right arm, BP Patient Position: Sitting, BP cuff size: Regular)    Pulse 82    Temp 97.9 F (36.6 C) (Oral)    Resp 16    Ht 5\' 9"  (1.753 m)    Wt 85 kg (187 lb 6.4 oz)    LMP 08/19/2019    SpO2 98%    BMI 27.67 kg/m     Allergies   Allergen Reactions    Vicodin [Hydrocodone-Acetaminophen] Hallucinations       1.  OB/GYN HISTORY:  OB History   Gravida Para Term Preterm AB Living   0 0 0 0 0 0   SAB TAB Ectopic Multiple Live Births   0 0 0 0 0     Patient's last menstrual period was 08/19/2019.   . Menstrual history includes abnormalities:  none. Last Mammogram was 1 month(s) ago.  Her last pap smear was 5 year(s), and it was normal, she has had no procedures.      Review of Systems  Constitutional: Negative  Eyes: Glasses/Contacts  ENT: Ringing in ears  Cardiac: Palpitations  Pulmonary: Shortness of breath  Gastrointestional: Negative  Musculoskeletal: Pain in joints;Lower back pain  Skin: Bruising  Neurologic: Negative  Psychiatric: Difficulty Sleeping  Endocrine: Hot flashes  Blood Disease: Negative  Allergy: Sinus problems  OB/Gyn: Negative    I did review available medical, surgical, obstetrical and social history.    PHYSICAL EXAM:  Head: Normocephalic. No masses, lesions, tenderness or abnormalities  Neck:  thyroid normal  Breasts: no lymphadenopathy, no skin changes, no masses or discharge  Abdomen: abdomen soft, non-tender, BS normal, no masses or HSM  Pelvic: normal external female genitalia  Vulva/Vagina: normal  Cervix: normal in appearance, no lesions or masses  Uterus: normal sized, mobile and non-tender  Adnexa: no  adnexal masses or tenderness  Rectal Exam: not performed  Abnormal Findings: none    IMPRESSION/ PLAN: Normal gyn exam  Menopausal sx, will try increase of pristiq to 75mg   HCM: PAP Smear collected  mmg yearly    Counseling: Exercise and Monthly SBE         Patient instructed on: diet, exercise    Patient barriers to [place]: none    Pt/Family understanding: verbalizes    Follow-Up: Return in 1 year for annual exam or  sooner if problems should occur.    Authored by: Hinda Kehr, MD

## 2020-02-25 ENCOUNTER — Other Ambulatory Visit: Payer: BLUE CROSS/BLUE SHIELD | Attending: Student in an Organized Health Care Education/Training Program

## 2020-02-25 ENCOUNTER — Ambulatory Visit (INDEPENDENT_AMBULATORY_CARE_PROVIDER_SITE_OTHER): Payer: BLUE CROSS/BLUE SHIELD | Admitting: Student in an Organized Health Care Education/Training Program

## 2020-02-25 ENCOUNTER — Encounter (INDEPENDENT_AMBULATORY_CARE_PROVIDER_SITE_OTHER): Payer: Self-pay | Admitting: Student in an Organized Health Care Education/Training Program

## 2020-02-25 VITALS — BP 121/81 | HR 96 | Temp 97.7°F | Resp 16 | Ht 69.0 in | Wt 188.0 lb

## 2020-02-25 DIAGNOSIS — Z131 Encounter for screening for diabetes mellitus: Secondary | ICD-10-CM

## 2020-02-25 DIAGNOSIS — E785 Hyperlipidemia, unspecified: Secondary | ICD-10-CM | POA: Insufficient documentation

## 2020-02-25 DIAGNOSIS — F32A Depression, unspecified: Secondary | ICD-10-CM

## 2020-02-25 DIAGNOSIS — Z7689 Persons encountering health services in other specified circumstances: Secondary | ICD-10-CM

## 2020-02-25 DIAGNOSIS — Z0189 Encounter for other specified special examinations: Secondary | ICD-10-CM

## 2020-02-25 DIAGNOSIS — Z1159 Encounter for screening for other viral diseases: Secondary | ICD-10-CM

## 2020-02-25 DIAGNOSIS — M545 Low back pain, unspecified: Secondary | ICD-10-CM

## 2020-02-25 DIAGNOSIS — Z1321 Encounter for screening for nutritional disorder: Secondary | ICD-10-CM | POA: Insufficient documentation

## 2020-02-25 DIAGNOSIS — G8929 Other chronic pain: Secondary | ICD-10-CM

## 2020-02-25 DIAGNOSIS — J301 Allergic rhinitis due to pollen: Secondary | ICD-10-CM

## 2020-02-25 DIAGNOSIS — Z13228 Encounter for screening for other metabolic disorders: Secondary | ICD-10-CM

## 2020-02-25 DIAGNOSIS — E663 Overweight: Secondary | ICD-10-CM | POA: Insufficient documentation

## 2020-02-25 DIAGNOSIS — Z130 Encounter for screening for diseases of the blood and blood-forming organs and certain disorders involving the immune mechanism: Secondary | ICD-10-CM

## 2020-02-25 DIAGNOSIS — Z6827 Body mass index (BMI) 27.0-27.9, adult: Secondary | ICD-10-CM

## 2020-02-25 DIAGNOSIS — E039 Hypothyroidism, unspecified: Secondary | ICD-10-CM | POA: Insufficient documentation

## 2020-02-25 DIAGNOSIS — Z0000 Encounter for general adult medical examination without abnormal findings: Secondary | ICD-10-CM

## 2020-02-25 LAB — COMPREHENSIVE METABOLIC PANEL, BLOOD
ALT (SGPT): 38 U/L — ABNORMAL HIGH (ref 0–33)
AST (SGOT): 24 U/L (ref 0–32)
Albumin: 4.6 g/dL (ref 3.5–5.2)
Alkaline Phos: 74 U/L (ref 35–140)
Anion Gap: 9 mmol/L (ref 7–15)
BUN: 10 mg/dL (ref 6–20)
Bicarbonate: 26 mmol/L (ref 22–29)
Bilirubin, Tot: 1.06 mg/dL (ref <1.2)
Calcium: 9.7 mg/dL (ref 8.5–10.6)
Chloride: 106 mmol/L (ref 98–107)
Creatinine: 0.78 mg/dL (ref 0.51–0.95)
GFR: >60 mL/min
Glucose: 101 mg/dL — ABNORMAL HIGH (ref 70–99)
Potassium: 4.4 mmol/L (ref 3.5–5.1)
Sodium: 141 mmol/L (ref 136–145)
Total Protein: 6.8 g/dL (ref 6.0–8.0)

## 2020-02-25 LAB — CBC WITH DIFF, BLOOD
ANC-Automated: 1.8 10*3/uL (ref 1.6–7.0)
Abs Basophils: 0 10*3/uL (ref <0.1)
Abs Eosinophils: 0 10*3/uL (ref 0.0–0.5)
Abs Lymphs: 2.1 10*3/uL (ref 0.8–3.1)
Abs Monos: 0.3 10*3/uL (ref 0.2–0.8)
Basophils: 1 %
Eosinophils: 1 %
Hct: 40.9 % (ref 34.0–45.0)
Hgb: 13.6 gm/dL (ref 11.2–15.7)
Lymphocytes: 49 %
MCH: 31.6 pg (ref 26.0–32.0)
MCHC: 33.3 g/dL (ref 32.0–36.0)
MCV: 94.9 um3 (ref 79.0–95.0)
MPV: 11.9 fL (ref 9.4–12.4)
Monocytes: 7 %
Plt Count: 211 10*3/uL (ref 140–370)
RBC: 4.31 10*6/uL (ref 3.90–5.20)
RDW: 11.9 % — ABNORMAL LOW (ref 12.0–14.0)
Segs: 42 %
WBC: 4.4 10*3/uL (ref 4.0–10.0)

## 2020-02-25 LAB — LIPID(CHOL FRACT) PANEL, BLOOD
Cholesterol: 258 mg/dL — ABNORMAL HIGH (ref <200)
HDL-Cholesterol: 80 mg/dL
LDL-Chol (Calc): 164 mg/dL — ABNORMAL HIGH (ref <160)
Non-HDL Cholesterol: 178 mg/dL
Triglycerides: 69 mg/dL (ref 10–170)

## 2020-02-25 LAB — HEPATITIS C AB, BLOOD: Hepatitis C Ab: NONREACTIVE

## 2020-02-25 LAB — GLYCOSYLATED HGB(A1C), BLOOD: Glyco Hgb (A1C): 5.4 % (ref 4.8–5.8)

## 2020-02-25 LAB — TSH, BLOOD: TSH: 0.03 u[IU]/mL — ABNORMAL LOW (ref 0.27–4.20)

## 2020-02-25 NOTE — Progress Notes (Signed)
Internal Medicine Primary Care Clinic Note    Chief Complaint   Patient presents with    Brittany [name] is a 54 year old female here to establish care with annual exam.    LV Dr. Laurance Flatten 01/2019.    #Bilateral plantar fasciitis  #R Knee OA--- improving  - PT ordered 01/2020  - voltaren gel prn    #Psychosocial stressors/Depression  - referred to [facility] 01/2020    #Perioral dermatitis: on metronidazole gel    #Menopause: on Pristiq  - [place] [facility] 01/2020, increased dose to 75 mg  - hot flashes improving  - RTC 1 year    Has mild URI at this time; Chills, dizzy, tired. Today not dizzy.  Mild runny nose.  No fevers or cough.  COVID test negative on Tues.    Chronic low back pain  Uses 30 tablets of flexeril every 3-4 months.  1 tablet at night a few times a month.  Has done PT twice a week.  No incontinence, saddle anesthesia, numbness/tingling, or LE weakness.    ROS  A complete ROS was done and was negative except as documented above in HPI  Constitutional: negative for: fatigue, night sweats, weight loss, loss of energy, anorexia, fever.   Eyes: negative for: blurry vision, visual loss, red eyes, eye pain.   CV: negative for: palpitations, chest pain, paroxysmal nocturnal dyspnea, orthopnea, lower extremity edema, pain in calves with walking   Resp: negative for: cough, shortness of breath, pleuritic pain, wheezing, DOE  GI: negative for: heartburn, nausea, vomiting, abdominal pain, constipation, diarrhea, jaundice, change in stool caliber, rectal bleeding or black stools  GU: negative for: nocturia, frequency, dysuria, urgency, hesitancy, hematuria, incontinence   Allergy/Immun: +stuffy nose    Problem list updated today:    Patient Active Problem List    Diagnosis Date Noted    Chronic midline low back pain without sciatica 10/17/2015    Hyperlipidemia, unspecified hyperlipidemia type 09/08/2015    Impingement syndrome, shoulder, right 05/02/2015    Radial styloid tenosynovitis  05/02/2015    Preventative health care 11/28/2014    Allergic rhinitis due to pollen 04/11/2014    Gastroesophageal reflux disease without esophagitis 04/11/2014    Bilateral low back pain without sciatica 09/21/2013    Tension headache 12/26/2011    Gluten intolerance 09/17/2011    Dyspepsia 07/15/2011    Intestinal gas excretion 07/15/2011    Abdominal pain 03/13/2011    Right knee pain 03/13/2011    Routine lab draw 03/13/2011    Hearing loss 12/14/2010    Cerumen impaction 12/14/2010    Pre-operative examination 12/06/2010     Brittany [name] is a 54 year old female G0P0000 with h/o menorrhagia who presents today for preop visit for hysteroscopy, D&C, possible resection of fibroid, and Novasure ablation.     Work-up:  11/22/10: EMB benign  11/22/10 pap: negative    11/28/10 Pelvic sono:  The uterus shows several intramural fibroids, largest measuring 3.7 x 3.2 cm   containing a subcentimeter cystic degeneration, abutting the endometrium. The  endometrial stripe is 1.8-mm. Both ovaries are normal measuring 2.6 x 1.3 x   1.7 cm on the right and 2.6 x 2.7 x 2.0 cm on the left. No free fluid is   seen        Skin lesion 12/05/2010    Palpitations 10/02/2010    Low back pain 06/15/2010    Fatigue 06/15/2010  Depression 07/25/2009    Sebaceous cyst 03/23/2009    Insomnia 11/16/2008    Acquired hypothyroidism 09/22/2008    Right foot pain 08/18/2008    Dysmenorrhea 08/18/2008    Grave's disease 01/15/2008    Vitamin D deficiency 01/06/2008    Benign neoplasm of skin, site unspecified 08/27/2007    Cold sensitivity 08/27/2007    Health maintenance examination 02/04/2007     Last pap 08/08/2004, has had squamous cells, repeats normal  Mammogram: 2/08, all normal  CHOL 225  HDL65  LDLCALC      143    TRIG 87  (12/15/06)  Cycles: regular q28 days, last two cycles "heavier flow" "more cramping" and 1day longer (5 instead of 4)         Infectious mononucleosis 09/11/2006       Past medical history,  surgical history, and family history reviewed and updated today as below.  Allergies and medications reviewed and updated as below.    Past Medical History:   Diagnosis Date    Hypothyroidism     h/o hyperthyroidism s/p radioactive iodine    Infectious mononucleosis 08/2006    Insomnia     Menorrhagia        Past Surgical History:   Procedure Laterality Date    left wrist tendon repair  1998    CHOLECYSTECTOMY, LAP  1995    PB REMOVE TONSILS/ADENOIDS,<12 Y/O  1975    PB MYOMECTOMY 1-4 MYOMAS 250 GM/< VAGINAL APPR      wisdom teeth         OB History   Gravida Para Term Preterm AB Living   0 0 0 0 0 0   SAB TAB Ectopic Multiple Live Births   0 0 0 0 0       Family History   Problem Relation Name Age of Onset    Breast Cancer Mother  51    Other Sister          Uterine fibroids/hysterectomy    Crohn's Disease Sister      Breast Cancer Sister  66    Ovarian Cancer Neg Hx          but sister w/ovarian cysts age 71       Social History:  Social History     Tobacco Use    Smoking status: Never Smoker    Smokeless tobacco: Never Used    Tobacco comment: 2nd hand smoke   Substance Use Topics    Alcohol use: Yes     Comment: 1 bottle wine /week    Drug use: No     Social History     Social History Narrative    August 08, 2017- Divorced and no kids. 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 Yoga and swimming/hiking.        08-08-2018: Sister died of endometrial cancer at 33 yo this year. Very precipitous. Mother still alive in [place]-assisted living.        She is youngest-passing has caused much pain        08-09-19: Hikes occasionally. Yoga on her own. Single.        Allergies:  Allergies   Allergen Reactions    Vicodin [Hydrocodone-Acetaminophen] Hallucinations       Current Outpatient Medications   Medication Sig    acetaminophen (TYLENOL) 325 MG tablet Take 650 mg by mouth every 4 hours as needed for Mild Pain (Pain Score 1-3).    cetirizine (ZYRTEC) 10 MG  tablet Take 10 mg by  mouth daily.    cyclobenzaprine (FLEXERIL) 5 MG tablet Take 1 tablet (5 mg) by mouth 3 times daily as needed for Muscle Spasms.    desvenlafaxine (PRISTIQ) 50 MG TB24 Take 1 tablet (50 mg) by mouth daily.    desvenlafaxine Succinate ER (PRISTIQ) 25 MG TB24 Take 1 tablet (25 mg) by mouth daily.    diclofenac (VOLTAREN) 1 % gel Apply 4 g topically 4 times daily.    fluticasone propionate (FLONASE) 50 MCG/ACT nasal spray Spray 1 spray into each nostril 2 times daily.    metroNIDAZOLE (METROGEL) 1 % gel Apply 1 Application topically daily. Use a small amount as directed    SYNTHROID 100 MCG tablet Take 1 tablet (100 mcg) by mouth every morning (before breakfast).    tretinoin (RETIN-A) 0.025 % cream Apply a thin layer at bedtime as directed     No current facility-administered medications for this visit.         OBJECTIVE:  Physical Exam  BP 121/81 (BP Location: Left arm, BP Patient Position: Sitting, BP cuff size: Regular)    Pulse 96    Temp 97.7 F (36.5 C) (Oral)    Resp 16    Ht 5' 9"  (1.753 m)    Wt 85.3 kg (188 lb)    SpO2 99%    BMI 27.76 kg/m   GEN: WDWN, NAD   EYES: Pupils equal and responsive to light.  Extraocular movements intact.  HEAD: Ears normal.  Throat, oral cavity and tongue normal w/o erythema or exudate.  No sinus tenderness. Mild rhinorrhea w/o bogginess of nasal turbinates.  NECK: Neck supple. No adenopathy or masses in the neck or supraclavicular regions.   CHEST:  Clear, good air entry, no wheezes, rhonchi or rales.   HEART:  Regular rate and rhythm. S1 and S2 normal, no murmurs, clicks, gallops or rubs.    ABDOMEN:  Soft without tenderness, guarding, mass or organomegaly.  BS+  EXTREMITIES:  WWP, no edema   MSK:   Minimal superior-lateral R knee swelling with mild tenderness lateral joint line.  No increased warmth or erythema.  Normal valgus and varus stress. Negative anterior and posterior drawer sign.  Full ROM back.  PSYCH:Oriented to person, place and time.  Normal affect and  judgment.  Linear thought content.     I have reviewed the most recent lab and imaging data.      Lab Results   Component Value Date    WBC 5.4 02/19/2019    RBC 4.12 02/19/2019    HGB 13.2 02/19/2019    HCT 40.5 02/19/2019    MCV 98.3 (H) 02/19/2019    MCHC 32.6 02/19/2019    RDW 11.9 (L) 02/19/2019    PLT 200 02/19/2019    MPV 11.8 02/19/2019       Lab Results   Component Value Date    NA 139 02/19/2019    K 4.0 02/19/2019    CL 104 02/19/2019    BICARB 24 02/19/2019    BUN 12 02/19/2019    CREAT 0.92 02/19/2019    GLU 98 02/19/2019    [place] 9.4 02/19/2019       Lab Results   Component Value Date    AST 10 02/19/2019    ALT 10 02/19/2019    ALK 36 02/19/2019    TP 6.9 02/19/2019    ALB 4.6 02/19/2019    TBILI 2.21 (H) 02/19/2019    DBILI 0.2 11/25/2014  Lab Results   Component Value Date    CHOL 248 (H) 02/19/2019    HDL 52 02/19/2019    LDLCALC 172 (H) 02/19/2019    TRIG 122 02/19/2019       Lab Results   Component Value Date    A1C 5.4 02/19/2019     Lab Results   Component Value Date    TSH 0.30 02/19/2019         A/P:  Brittany [name] is a 54 year old female here to establish care with annual exam.      Encounter for routine adult medical examination  Encounter to establish care    Encounter for routine laboratory testing  -     Lipid Panel Green Plasma Separator Tube; Future  -     Hepatitis C Antibody (Hep C Ab); Future    Screening, iron deficiency anemia  -     CBC w/ Diff Lavender; Future    Screening for metabolic disorder  Mildly elevated T bili  ?Gilbert's  -     Comprehensive Metabolic Panel; Future    Encounter for screening for other viral diseases  -     Hepatitis C Antibody (Hep C Ab); Future    Hyperlipidemia, unspecified hyperlipidemia type  -     Lipid Panel Green Plasma Separator Tube; Future    Seasonal allergic rhinitis due to pollen  Mild URI  Likely viral URI at this time w/o systemic signs of infection.  - cont flonase prn  - cont conservative treatment (hydration, nyquil  prn)    Acquired hypothyroidism  -     TSH, Blood - See Instructions; Future  - cont synthroid daily    Mild Depression/Psychosocial stressors  Referred to [facility] 01/2020.  - cont pristiq    Overweight (BMI 25.0-29.9)  -     Glycosylated Hgb(A1C), Blood Lavender; Future  - discussed working on portion control  PAVS Total Activity: 0 minutes/week minutes/week   I have counseled Brittany [name] on improving her level of exercise in the following way:  Discussed 2.5 hours weekly of at least moderately strenuous exercise      Screening for diabetes mellitus  -     Glycosylated Hgb(A1C), Blood Lavender; Future    Chronic bilateral low back pain without sciatica  No alarm sxs.  - cont flexeril prn flares (uses sparingly)  - tylenol 1 g TID prn or ibuprofen prn  - heating packs, voltaren gel prn    Chronic R knee OA  Mild OA xray 12/2017.  - tylenol 1 g TID prn  - voltaren gel prn    #Health Maintenance    Routine health care maintenance were dealt with today. Appropriate screening labs and studies were ordered, and preventative counseling was done.    Immunizations:  Current Immunizations  Reviewed on 02/13/2018    Name Date    (Chicken Pox) Varicella Vaccine  04/25/2014 ,  03/23/2014    (Shingles) Herpes Zoster Recombinant Vaccine [facility])  11/12/2019    COVID-19 (Moderna)  06/17/2019 ,  05/20/2019    Influenza Vaccine >=6 Months  02/04/2020 ,  02/13/2019 ,  02/13/2018 ,  03/07/2017 ,  02/28/2016 ,  03/17/2015 ,  03/19/2014 ,  03/19/2013 ,  03/02/2012 ,  03/13/2011 ,  02/07/2010 ,  04/18/2009    Td  04/28/2007    Tdap  08/18/2017        Shingrix 2nd dose deferred due to mild URI    #Colonoscopy 04/2019: Pan-colonic diverticulosis,  most dense in the left colon. Small internal hemorrhoids.    Women's HM:  #Mammo normal 12/2019  #PAP: Pap normal 02/01/20 and neg HPV       The 10-year ASCVD risk score Mikey Bussing DC Jr., et al., 2013) is: 2.6%    Values used to calculate the score:      Age: 85 years      Sex: Female      Is Non-Hispanic African  American: No      Diabetic: No      Tobacco smoker: No      Systolic Blood Pressure: 702 mmHg      Is BP treated: No      HDL Cholesterol: 52 mg/dL      Total Cholesterol: 248 mg/dL      Patient Instructions   It was nice to meet you Brittany [name].  Labs today.  I encourage getting back into a regular exercise routine as tolerated with your knee.  Consider portion control for diet.        Return in about 1 year (around 02/24/2021) for Annual visit.      Barriers to Learning assessed: none. Patient verbalizes understanding of teaching and instructions and is agreeable to above plan.

## 2020-02-25 NOTE — Patient Instructions (Addendum)
It was nice to meet you Dyann Ruddle.  Labs today.  I encourage getting back into a regular exercise routine as tolerated with your knee.  Consider portion control for diet.

## 2020-02-25 NOTE — Interdisciplinary (Signed)
Blood drawn from right arm with 21 gauge needle. 4 tubes taken.   Patient identity authenticated by Lisa Romero.

## 2020-02-27 ENCOUNTER — Other Ambulatory Visit (INDEPENDENT_AMBULATORY_CARE_PROVIDER_SITE_OTHER): Payer: Self-pay | Admitting: Student in an Organized Health Care Education/Training Program

## 2020-02-27 DIAGNOSIS — E039 Hypothyroidism, unspecified: Secondary | ICD-10-CM

## 2020-02-27 MED ORDER — LEVOTHYROXINE SODIUM 100 MCG OR TABS
100.0000 ug | ORAL_TABLET | Freq: Every day | ORAL | 0 refills | Status: DC
Start: 2020-02-27 — End: 2020-02-28

## 2020-02-27 NOTE — Telephone Encounter (Signed)
Please call pt to let her know that her TSH level is too low meaning that she has excess thyroid hormone and we should decrease her levothyroxine dose.   Please have her decrease to levothyroxine 100 mcg daily. I have placed an Rx.   I have also ordered a repeat TSH level for her to get 6-8 weeks after she decreases her dose.   The rest of her labs look good. Continue working on diet and exercise as discussed.  Thanks.

## 2020-02-28 ENCOUNTER — Encounter (INDEPENDENT_AMBULATORY_CARE_PROVIDER_SITE_OTHER): Payer: Self-pay | Admitting: Obstetrics & Gynecology

## 2020-02-28 MED ORDER — SYNTHROID 100 MCG OR TABS
100.0000 ug | ORAL_TABLET | Freq: Every day | ORAL | 0 refills | Status: DC
Start: 2020-02-28 — End: 2020-04-06

## 2020-02-28 NOTE — Telephone Encounter (Signed)
S/w patient; informed of MD response.  Patient verbalized understanding and expressed gratitude.

## 2020-02-28 NOTE — Telephone Encounter (Signed)
Call transferred from [facility]; patient calling back to request brand medication of Synthroid 100 mcg; stated that she has tried generic in the past and did not respond well.  Patient stating that if cost isn't too extravagant, she would like to try brand name vs generic (patient aware that cost of medication would be between her and insurance).    Order pending approval.

## 2020-02-28 NOTE — Addendum Note (Signed)
Addended by: Ree Shay on: 02/28/2020 12:14 PM     Modules accepted: Orders

## 2020-02-28 NOTE — Addendum Note (Signed)
Addended by: Orvis Brill on: 02/28/2020 10:29 AM     Modules accepted: Orders

## 2020-02-28 NOTE — Telephone Encounter (Signed)
Rx signed. Thanks!

## 2020-02-29 ENCOUNTER — Telehealth (INDEPENDENT_AMBULATORY_CARE_PROVIDER_SITE_OTHER): Payer: Self-pay | Admitting: Student in an Organized Health Care Education/Training Program

## 2020-02-29 NOTE — Telephone Encounter (Addendum)
Fax Received    Date Received: 02/28/2020    Date Processed: 02/29/2020    From: [facility]    Document Type: PAR- SYNTHROID 100 MCG tablet        Thank you.

## 2020-03-01 NOTE — Telephone Encounter (Signed)
Prior Auth Request for Levothyroxine (Synthroid) has been placed in queue for processing. Please allow up to 72 business hours for initiation.     If this is an urgent request due to immediate therapy please re-route as high priority.       Thank you!     [facility]   Refill and Prior Auth Clinical Services

## 2020-03-01 NOTE — Telephone Encounter (Signed)
Prior Auth Request submitted for Synthroid 100MCG   on 03/01/2020  via CMM Key # [id number]        ** Please allow 3-5 business days for determination from insurance**    Insurance Plan:  Kingsley Spittle   Group: [id number]  RX ID:  [id number]  BIN:  [id number]  PCN:  [id number]     Diagnosis:  Acquired hypothyroidism (E03.9)  Medications Tried/Failed:  Levo   Justification:   Per refill 02/27/2020            PMAC will monitor status.     [facility]   Refill and Prior [place]

## 2020-03-01 NOTE — Telephone Encounter (Signed)
Prior Auth Request APPROVED for Synthroid        Valid until 03/01/21    Pharmacy has been notified and will contact patient when ready to be picked up.     No further action needed, closing encounter.       Thank you!    [facility]   Refill and Prior [place]

## 2020-03-08 ENCOUNTER — Other Ambulatory Visit (INDEPENDENT_AMBULATORY_CARE_PROVIDER_SITE_OTHER): Payer: BLUE CROSS/BLUE SHIELD

## 2020-03-08 DIAGNOSIS — Z1159 Encounter for screening for other viral diseases: Secondary | ICD-10-CM

## 2020-03-08 LAB — COVID-19 DETECTION ASSAY SYM HEALTH EMP: COVID-19 Coronavirus Result: NOT DETECTED

## 2020-03-24 ENCOUNTER — Other Ambulatory Visit (INDEPENDENT_AMBULATORY_CARE_PROVIDER_SITE_OTHER): Payer: BLUE CROSS/BLUE SHIELD

## 2020-03-24 ENCOUNTER — Telehealth (INDEPENDENT_AMBULATORY_CARE_PROVIDER_SITE_OTHER): Payer: Self-pay

## 2020-03-24 DIAGNOSIS — Z1159 Encounter for screening for other viral diseases: Secondary | ICD-10-CM

## 2020-03-24 LAB — COVID-19 DETECTION ASSAY SYM HEALTH EMP: COVID-19 Coronavirus Result: NOT DETECTED

## 2020-03-29 ENCOUNTER — Ambulatory Visit (INDEPENDENT_AMBULATORY_CARE_PROVIDER_SITE_OTHER): Payer: BLUE CROSS/BLUE SHIELD

## 2020-03-29 DIAGNOSIS — Z23 Encounter for immunization: Secondary | ICD-10-CM

## 2020-04-04 ENCOUNTER — Ambulatory Visit (INDEPENDENT_AMBULATORY_CARE_PROVIDER_SITE_OTHER): Payer: BLUE CROSS/BLUE SHIELD | Admitting: Internal Medicine

## 2020-04-05 ENCOUNTER — Encounter (INDEPENDENT_AMBULATORY_CARE_PROVIDER_SITE_OTHER): Payer: Self-pay | Admitting: Student in an Organized Health Care Education/Training Program

## 2020-04-05 ENCOUNTER — Other Ambulatory Visit: Payer: BLUE CROSS/BLUE SHIELD | Attending: Student in an Organized Health Care Education/Training Program

## 2020-04-05 DIAGNOSIS — E039 Hypothyroidism, unspecified: Secondary | ICD-10-CM

## 2020-04-05 LAB — TSH, BLOOD: TSH: 0.26 u[IU]/mL — ABNORMAL LOW (ref 0.27–4.20)

## 2020-04-05 NOTE — Interdisciplinary (Signed)
Blood drawn from right arm with 21 gauge needle. 1 tubes taken.   Patient identity authenticated by Lisa Romero.

## 2020-04-05 NOTE — Telephone Encounter (Signed)
From: [name]  To: [name], MD  Sent: 04/05/2020 2:16 PM PST  Subject: Question regarding TSH, BLOOD    Hi Dr. Charlynn Court,  Got the blood work done today. When you have time, please let me know if I should stay on the current dose of 100.   Thank you.  Dyann Ruddle

## 2020-04-06 MED ORDER — SYNTHROID 88 MCG OR TABS
88.0000 ug | ORAL_TABLET | Freq: Every day | ORAL | 0 refills | Status: DC
Start: 2020-04-06 — End: 2020-04-06

## 2020-04-06 MED ORDER — SYNTHROID 100 MCG OR TABS
100.00 ug | ORAL_TABLET | Freq: Every day | ORAL | 0 refills | Status: DC
Start: 2020-04-06 — End: 2020-05-16

## 2020-04-10 ENCOUNTER — Encounter (INDEPENDENT_AMBULATORY_CARE_PROVIDER_SITE_OTHER): Payer: Self-pay | Admitting: Hospital

## 2020-04-18 ENCOUNTER — Other Ambulatory Visit (INDEPENDENT_AMBULATORY_CARE_PROVIDER_SITE_OTHER): Payer: Self-pay | Admitting: Student in an Organized Health Care Education/Training Program

## 2020-04-18 DIAGNOSIS — E039 Hypothyroidism, unspecified: Secondary | ICD-10-CM

## 2020-04-18 NOTE — Telephone Encounter (Signed)
Prescription Refill Request     Incoming fax refill request from pharmacy   Name of PCP Provider: [name]   Last office visit: 02/25/2020  Next office visit: Visit date not found    Allergies   Allergen Reactions    Vicodin [Hydrocodone-Acetaminophen] Hallucinations       Requested Medication/s:   Current Outpatient Medications   Medication Sig Dispense Refill    SYNTHROID 100 MCG tablet Take 1 tablet (100 mcg) by mouth every morning (before breakfast). 90 tablet 0     No current facility-administered medications for this visit.         Patient has made an attempt to refill with pharmacy? Unknown    Send to:     [id number] - [place], [place] - [address]  [address]  [place] [address]  Phone: [phone] Fax: [phone] Alternate Fax: [phone]      Has been advised this message will be transmitted to office and if any issues can expect a response within the next 24-72 hours, otherwise pharmacy should be contacting patient when refill is processed.

## 2020-04-18 NOTE — Telephone Encounter (Signed)
Tally Joe, CPhT  (Rx Refill and [facility] Clinic)      Hypothyroid Medication Refill Protocol    Last visit in enc specialty: 02/25/2020     Recent Visits in This Encounter Department     Date Provider Department Visit Type Primary Dx    02/25/2020 [name], MD [facility] Office Visit Encounter for routine adult medical examination    01/25/2020 Rennis Chris, MD [facility] Office Visit Plantar fasciitis, bilateral         Next f/u appt due:  Return in about 1 year (around 02/24/2021) for Annual visit  Next appt in enc specialty: Visit date not found      Future Appointments 04/18/2020 - 04/17/2025              Date Visit Type Department Provider     09/29/2020  8:15 AM [facility] Marguerite Olea, MD    Appointment Notes:     annual tbse                 Per OV notes from 02/25/2020:  A/P: Acquired hypothyroidism  -     TSH, Blood - See Instructions; Future  - cont synthroid daily    **If no recent dose changes, and last TSH (and T4 for Liothyronine only) normal/within date, no recent notes will be reviewed per Pharmacy Dept protocol**    Per MyChart notes from 04/05/2020:            LABS required:  (Q year TSH)  *If recent dose change: check TSH 2 months after starting new  dose.    Lab Results   Component Value Date    TSH 0.26 (L) 04/05/2020    FREET4 1.82 (H) 11/25/2014       Monitoring required:  (Q year BP, HR, Wt)   *If pt is pregnant, DEFER TO MD*    (BP range: ZOXWRUEA=54-098  JXBJYNWGN=56-21)  Blood Pressure   02/25/20 121/81   02/01/20 133/86   01/31/20 149/82       (HR range: 55-110)   Pulse Readings from Last 3 Encounters:   02/25/20 96   02/01/20 82   01/31/20 98       Wt Readings from Last 3 Encounters:   02/25/20 85.3 kg (188 lb)   02/01/20 85 kg (187 lb 6.4 oz)   01/25/20 84.4 kg (186 lb)

## 2020-04-20 NOTE — Telephone Encounter (Signed)
Dr. Charlynn Court, Cleophas Dunker,    Please review pended refill request for Synthroid.  Pt's last TSH was suppressed, however per 04/05/20 MyChart, pt wanted to finish out her current supply of 100 mcg tabs before switching to 88 mcg.    [facility] is now requesting a refill of 100 mcg.  Ok to send new rx for 88 mcg now, and pt can pick up refill once she has completed the 100 mcg tabs she has at home?        Thank you!    Fraser Din, PharmD, [facility]   Refill and Prior [place]  Phone:  [phone]  Ext:  [phone]

## 2020-04-20 NOTE — Telephone Encounter (Signed)
Patient should have enough pills of the 100 mcg daily until approx 05/30/20 at which time she would be due for repeat thyroid tests. She was very close to a normal TSH so will wait until repeat labs to make a decision on decreasing the dose to 88 mcg, since it is a significant expense out of pocket for her.   No Rx at this time until she finishes 100 mcg daily.  Pt informed. Thanks.

## 2020-04-21 ENCOUNTER — Other Ambulatory Visit (INDEPENDENT_AMBULATORY_CARE_PROVIDER_SITE_OTHER): Payer: BLUE CROSS/BLUE SHIELD

## 2020-04-21 DIAGNOSIS — Z1159 Encounter for screening for other viral diseases: Secondary | ICD-10-CM

## 2020-04-21 LAB — COVID-19 DETECTION ASSAY ASYM HEALTH EMP: COVID-19 Coronavirus Result: NOT DETECTED

## 2020-05-05 ENCOUNTER — Other Ambulatory Visit (INDEPENDENT_AMBULATORY_CARE_PROVIDER_SITE_OTHER): Payer: BLUE CROSS/BLUE SHIELD

## 2020-05-07 ENCOUNTER — Other Ambulatory Visit (INDEPENDENT_AMBULATORY_CARE_PROVIDER_SITE_OTHER): Payer: BLUE CROSS/BLUE SHIELD

## 2020-05-07 DIAGNOSIS — Z1159 Encounter for screening for other viral diseases: Secondary | ICD-10-CM

## 2020-05-07 LAB — COVID-19 DETECTION ASSAY ASYM HEALTH EMP: COVID-19 Coronavirus Result: NOT DETECTED

## 2020-05-09 ENCOUNTER — Ambulatory Visit (INDEPENDENT_AMBULATORY_CARE_PROVIDER_SITE_OTHER): Payer: Self-pay | Admitting: Student in an Organized Health Care Education/Training Program

## 2020-05-09 ENCOUNTER — Emergency Department (HOSPITAL_COMMUNITY): Payer: BLUE CROSS/BLUE SHIELD

## 2020-05-09 ENCOUNTER — Emergency Department
Admission: EM | Admit: 2020-05-09 | Discharge: 2020-05-09 | Disposition: A | Discharge status: Home / Self Care | Payer: BLUE CROSS/BLUE SHIELD | Attending: Emergency Medicine | Admitting: Emergency Medicine

## 2020-05-09 ENCOUNTER — Encounter (HOSPITAL_BASED_OUTPATIENT_CLINIC_OR_DEPARTMENT_OTHER): Payer: Self-pay | Admitting: Student in an Organized Health Care Education/Training Program

## 2020-05-09 DIAGNOSIS — Z79899 Other long term (current) drug therapy: Secondary | ICD-10-CM | POA: Insufficient documentation

## 2020-05-09 DIAGNOSIS — M1711 Unilateral primary osteoarthritis, right knee: Secondary | ICD-10-CM | POA: Insufficient documentation

## 2020-05-09 DIAGNOSIS — R9431 Abnormal electrocardiogram [ECG] [EKG]: Secondary | ICD-10-CM | POA: Insufficient documentation

## 2020-05-09 DIAGNOSIS — Z885 Allergy status to narcotic agent status: Secondary | ICD-10-CM | POA: Insufficient documentation

## 2020-05-09 DIAGNOSIS — E0500 Thyrotoxicosis with diffuse goiter without thyrotoxic crisis or storm: Secondary | ICD-10-CM | POA: Insufficient documentation

## 2020-05-09 DIAGNOSIS — R079 Chest pain, unspecified: Secondary | ICD-10-CM | POA: Insufficient documentation

## 2020-05-09 LAB — CBC WITH DIFF, BLOOD
ANC-Automated: 2.4 10*3/uL (ref 1.6–7.0)
Abs Basophils: 0 10*3/uL (ref <0.1)
Abs Eosinophils: 0 10*3/uL (ref 0.0–0.5)
Abs Lymphs: 2.6 10*3/uL (ref 0.8–3.1)
Abs Monos: 0.4 10*3/uL (ref 0.2–0.8)
Basophils: 1 %
Eosinophils: 1 %
Hct: 35.7 % (ref 34.0–45.0)
Hgb: 12.3 gm/dL (ref 11.2–15.7)
Lymphocytes: 48 %
MCH: 32.3 pg — ABNORMAL HIGH (ref 26.0–32.0)
MCHC: 34.5 g/dL (ref 32.0–36.0)
MCV: 93.7 um3 (ref 79.0–95.0)
MPV: 11.3 fL (ref 9.4–12.4)
Monocytes: 7 %
Plt Count: 194 10*3/uL (ref 140–370)
RBC: 3.81 10*6/uL — ABNORMAL LOW (ref 3.90–5.20)
RDW: 11.7 % — ABNORMAL LOW (ref 12.0–14.0)
Segs: 43 %
WBC: 5.5 10*3/uL (ref 4.0–10.0)

## 2020-05-09 LAB — BASIC METABOLIC PANEL, BLOOD
Anion Gap: 11 mmol/L (ref 7–15)
BUN: 13 mg/dL (ref 6–20)
Bicarbonate: 24 mmol/L (ref 22–29)
Calcium: 9 mg/dL (ref 8.5–10.6)
Chloride: 108 mmol/L — ABNORMAL HIGH (ref 98–107)
Creatinine: 0.78 mg/dL (ref 0.51–0.95)
GFR: >60 mL/min
Glucose: 104 mg/dL — ABNORMAL HIGH (ref 70–99)
Potassium: 3.9 mmol/L (ref 3.5–5.1)
Sodium: 143 mmol/L (ref 136–145)

## 2020-05-09 LAB — PRO BNP, BLOOD: BNPP: 94 pg/mL (ref 0–899)

## 2020-05-09 LAB — TROPONIN T GEN 5 W/REFLEX TO CK/CKMB: Troponin T Gen 5 w/Reflex CK/CKMB: <6 ng/L (ref <14)

## 2020-05-09 LAB — MAGNESIUM, BLOOD: Magnesium: 2.1 mg/dL (ref 1.6–2.6)

## 2020-05-09 LAB — HIV 1/2 ANTIBODY & P24 ANTIGEN ASSAY, BLOOD: HIV 1/2 Antibody & P24 Antigen Assay: NONREACTIVE

## 2020-05-09 NOTE — ED Notes (Signed)
Pt to ed aox4, c/o substernal cp x4 days intermittent  -sob/n/v/d/abd pain fever/chills/dizziness  rr reg unlabored speech clear full sentences moves all extremities skin warm dry  On montior  Ed md mason at bedside

## 2020-05-09 NOTE — ED Notes (Signed)
Report given to [employer]

## 2020-05-09 NOTE — Telephone Encounter (Signed)
Symptom Call          Next office visit:  05/16/2020    What symptom is the patient experiencing? Per MyChart Message:  [name] ,    We have received your message and noted you are currently experiencing symptoms. To better assist you, this message is now being handled by our triage nurse who will be attempting to communicate with you via telephone to discuss your symptoms in further detail. If your symptoms worsen or change, please let us know or proceed to the nearest emergency department or call 911.     In the future, please call our Primary Care line at [phone]) when you are experiencing symptoms.     Thank you for choosing [facility] for your primary care needs.       ===View-only below this line===      ----- Message -----       From:[name]       Sent:05/09/2020  7:36 AM PST         To: [facility]    Subject:Appointment scheduled from [place]    Appointment For: Brittany [name] ([id number])  Visit Type: RETURN PRIMARY CARE PATIENT ([id number])    05/16/2020  11:40 AM  20 mins.  Hakim, Cleophas Dunker, MD   [facility] INTERNAL MEDICINE    Patient Comments:  Tightness/pain in sternum for past few days - GERD? Feel it the most when lying down or bending over      Name of PCP Provider: [name]   Insurance Coverage Verified: Active- in network  Last office visit: 02/25/2020    Who is reporting the symptoms? Patient sent MyChart message on Date/ time: 12/21 at 7:36 AM    Is this a new or ongoing symptom? new  Estimated time since experiencing symptom(s)? UNKNOWN    Best way to contact patient: [phone] (mobile)       P CARE NAV TRIAGE POOL [ [id number] ]

## 2020-05-09 NOTE — ED Provider Notes (Signed)
Nursing triage CC:  Chief Complaint   Patient presents with    Chest Pain - Adult     CP starting "a few days ago". today pretty constant. worse with position change and taking a deep breath. -n/v       HPI:  54 year old female presenting with chest pain. Past medical history of R knee osteoarthritis, depression, Grave's disease    Four days of intermittent substernal chest pain.  Described as sharp and stabbing.  Nonradiating.  No associated shortness of breath, diaphoresis, nausea or vomiting.  No history of cardiac disease.  Nonexertional.  Worse with deep inspiration and when leaning forward.  No worsening or relief with meals.  No cough or congestion, fevers or chills.  Otherwise feeling in her normal state of health.  Has never had anything like this happen before.  No history of heart disease but has never spoken to her been evaluated by a cardiologist before    Med Hx:  - R knee osteoarthritis, depression, Grave's disease    Surg Hx:  - No previous operations or procedures    Social Hx:  - Lives in [place]    Medications:  Prior to Admission Medications   Prescriptions Last Dose Informant Patient Reported? Taking?   SYNTHROID 100 MCG tablet   Yes No   Sig: Take 1 tablet (100 mcg) by mouth every morning (before breakfast).   acetaminophen (TYLENOL) 325 MG tablet   Yes No   Sig: Take 650 mg by mouth every 4 hours as needed for Mild Pain (Pain Score 1-3).   cetirizine (ZYRTEC) 10 MG tablet   Yes No   Sig: Take 10 mg by mouth daily.   cyclobenzaprine (FLEXERIL) 5 MG tablet   No No   Sig: Take 1 tablet (5 mg) by mouth 3 times daily as needed for Muscle Spasms.   desvenlafaxine (PRISTIQ) 50 MG TB24   No No   Sig: Take 1 tablet (50 mg) by mouth daily.   desvenlafaxine Succinate ER (PRISTIQ) 25 MG TB24   No No   Sig: Take 1 tablet (25 mg) by mouth daily.   diclofenac (VOLTAREN) 1 % gel   No No   Sig: Apply 4 g topically 4 times daily.   fluticasone propionate (FLONASE) 50 MCG/ACT nasal spray   No No   Sig: Spray  1 spray into each nostril 2 times daily.   metroNIDAZOLE (METROGEL) 1 % gel   No No   Sig: Apply 1 Application topically daily. Use a small amount as directed   tretinoin (RETIN-A) 0.025 % cream   No No   Sig: Apply a thin layer at bedtime as directed      Facility-Administered Medications: None       Allergies:  Vicodin [hydrocodone-acetaminophen]    Review of Systems:  All other systems reviewed and negative unless otherwise noted in the HPI or above. This was done per my custom and practice for systems appropriate to the chief complaint in an emergency department setting and varies depending on the quality of history that the patient is able to provide.    Physical Exam:   05/09/20  1649 05/09/20  1959   BP: 146/93 141/83   Pulse: 94 97   Resp: 16 16   Temp: 98.5 F (36.9 C) 98 F (36.7 C)   SpO2: 95% 98%     General:  Middle-aged female resting quietly in bed, no acute distress and nontoxic appearing  Cardiac: reg rate  and rhythm, no gallups/rubs/murmurs, no peripheral edema, tender to palpation over sternum  Resp: nonlabored, clear to auscultation bilaterally, no wheezing/ronchi/rales  Abd: soft, no guarding or rigidity, nontender  Psych: good eye contact, not responding to internal stimuli, appropriate mood and affect  Neuro: moves all four extremities spontaneously, normal eye movements, no aphasia  Derm: no obvious rashes or lesions  Head: normocephalic, atraumatic  MSK: no gross deformities or obvious bleeding    EKG:  Sinus rhythm w/ normal PR, QRS, QTc intervals. Nonspecific T wave inversion V2. Normal axis. No ST changes. No Q1, S3, T3 sign. No tachycardia     Bedside US:  Bedside cardiac ultrasound with no evidence of pericardial effusion, right-sided heart strain, focal wall motion abnormality, proximal aortic root dilation, proximal aortic dissection, pleural effusion.  No depressed ejection fraction, IVC with normal collapsibility. No B-lines bilaterally.  Images permanently stored in our image  storing software and interpreted by myself.    Labs:  Labs Reviewed   BASIC METABOLIC PANEL, BLOOD - Abnormal; Notable for the following components:       Result Value    Glucose 104 (*)     Chloride 108 (*)     All other components within normal limits   CBC WITH DIFF, BLOOD - Abnormal; Notable for the following components:    RBC 3.81 (*)     MCH 32.3 (*)     RDW 11.7 (*)     All other components within normal limits   HIV 1/2 ANTIBODY & P24 ANTIGEN ASSAY, BLOOD   MAGNESIUM, BLOOD   TROPONIN T GEN 5 W/REFLEX TO CK/CKMB   PRO BNP, BLOOD   PROTHROMBIN TIME, BLOOD   INFLUENZA A/B & SARS-COV-2 PCR COMBO FOR RAPID RESPONSE LAB       Imaging:  X-Ray Chest Single View   Final Result   IMPRESSION:   1. No active cardiopulmonary process.          Meds administered:  Medications - No data to display    MDM:    54 year old female presenting with chest pain. Past medical history of R knee osteoarthritis, depression, Grave's disease.  Unclear cause of her chest pain. Unlikely ACS with atypical history, negative troponin, and nonischemic EKG. Unlikely PE without tachycardia, R heart strain on POCUS, risk factors (though does have mild pleuritic component). Unlikely pericarditis without EKG changes (though is worse leaning forwards). Unlikely pneumonia without associated symptoms. Unlikely COVID with recent negative test. May represent a mild costochondritis, especially given pain reproducible with palpation, but will discharge with strict return precautions and encouragement to follow up with her PCP     Radene Journey, MD  Resident  05/10/20 0004       Fernande Bras, MD  05/11/20 1226

## 2020-05-09 NOTE — Telephone Encounter (Signed)
PROVIDER ACTION REQUESTED: No, FYI only   Action item needed:  None   Appt scheduled: No    Chief Complaint   Chest pain on and off , constant today    Assessment details:   Hx of GERD.  Reports chest tightness,  Pressure to mid chest / sternum area that comes and goes in the last few days but today has been consistent the  whole day.  Pain ( moderate)  Increases  when she takes a deep breath and or bending.  No cough and breathing is OK.  Nol dizziness reported.  No weakness no numbness reported.  Advise pt to be evaluated in the ER today. ER referral placed.   Will route for FYI.    CLINIC/RN/LVN ACTION REQUEST: NO, FYI Only  Action item needed: NO, FYI Only    Patient advised to be evaluated in ED, and have another person drive them to hospital.  RN placed ED referral in Epic.     COVID VACCINATION STATUS: Yes, fully vaccinated  FLU VACCINATION STATUS: YES  COVID TRIAGE PROTOCOL: NEGATIVE   Symptoms w/in the last 14 days: fever or chills, cough, sob/diff breathing, fatigue, muscle or body aches, headache, loss of taste or smell, sore throat, congestion or runny nose, nausea and vomiting, diarrhea.   Questions:   1. Looking back to the last 14 days, have you been informed by a public health agency or a healthcare agency or a healthcare system that you have been exposed to [place] (COVID-19)  2. Looking back to the last 14 days, has a person in your household been diagnosed with Coronavirus (COVID-19) infection       Reason for Call: Chest Pain     Disposition: Go to ED Now     Reason for Disposition   [1] Chest pain (or "angina") comes and goes AND [2] is happening more often (increasing in frequency) or getting worse (increasing in severity) (Exception: chest pains that last only a few seconds)    Additional Information   Negative: SEVERE difficulty breathing (e.g., struggling for each breath, speaks in single words)   Negative: Difficult to awaken or acting confused (e.g., disoriented, slurred speech)    Negative: Shock suspected (e.g., cold/pale/clammy skin, too weak to stand, low BP, rapid pulse)   Negative: Passed out (i.e., fainted, collapsed and was not responding)   Negative: [1] Chest pain lasts > 5 minutes AND [2] age > 65   Negative: [1] Chest pain lasts > 5 minutes AND [2] age > 30 AND [3] one or more cardiac risk factors (e.g., diabetes, high blood pressure, high cholesterol, smoker, or strong family history of heart disease)   Negative: [1] Chest pain lasts > 5 minutes AND [2] history of heart disease (i.e., angina, heart attack, heart failure, bypass surgery, takes nitroglycerin)   Negative: [1] Chest pain lasts > 5 minutes AND [2] described as crushing, pressure-like, or heavy   Negative: Heart beating < 50 beats per minute OR > 140 beats per minute   Negative: Visible sweat on face or sweat dripping down face   Negative: Sounds like a life-threatening emergency to the triager   Negative: Followed a chest injury   Negative: SEVERE chest pain    Answer Assessment - Initial Assessment Questions  1. LOCATION: "Where does it hurt?"          Mid chest area , sternum area   2. RADIATION: "Does the pain go anywhere else?" (e.g., into neck, jaw, arms, back)  None   3. ONSET: "When did the chest pain begin?" (Minutes, hours or days)        X few days   4. PATTERN "Does the pain come and go, or has it been constant since it started?"  "Does it get worse with exertion?"       On and off   5. DURATION: "How long does it last" (e.g., seconds, minutes, hours)         Been constant all day today   6. SEVERITY: "How bad is the pain?"  (e.g., Scale 1-10; mild, moderate, or severe)     - MILD (1-3): doesn't interfere with normal activities      - MODERATE (4-7): interferes with normal activities or awakens from sleep     - SEVERE (8-10): excruciating pain, unable to do any normal activities            4/10   Increases when taking a deep breath or bending over.  7. CARDIAC RISK FACTORS: "Do you have any  history of heart problems or risk factors for heart disease?" (e.g., angina, prior heart attack; diabetes, high blood pressure, high cholesterol, smoker, or strong family history of heart disease)        None   8. PULMONARY RISK FACTORS: "Do you have any history of lung disease?"  (e.g., blood clots in lung, asthma, emphysema, birth control pills)       None   9. CAUSE: "What do you think is causing the chest pain?"       Pt doesn't know   10. OTHER SYMPTOMS: "Do you have any other symptoms?" (e.g., dizziness, nausea, vomiting, sweating, fever, difficulty breathing, cough)          None   11. PREGNANCY: "Is there any chance you are pregnant?" "When was your last menstrual period?"         N/A  Hx of GERD.  Advise to be evaluated in the ER today. ER referral placed.    Protocols used: CHEST PAIN-A-AH

## 2020-05-10 LAB — ECG 12-LEAD
ATRIAL RATE: 79 {beats}/min
ECG INTERPRETATION: NORMAL
P AXIS: 55 degrees
PR INTERVAL: 130 ms
QRS INTERVAL/DURATION: 86 ms
QT: 382 ms
QTc (Bazett): 438 ms
R AXIS: 34 degrees
T AXIS: 51 degrees
VENTRICULAR RATE: 79 {beats}/min

## 2020-05-14 ENCOUNTER — Other Ambulatory Visit (INDEPENDENT_AMBULATORY_CARE_PROVIDER_SITE_OTHER): Payer: BLUE CROSS/BLUE SHIELD

## 2020-05-14 DIAGNOSIS — Z1159 Encounter for screening for other viral diseases: Secondary | ICD-10-CM

## 2020-05-15 LAB — COVID-19 DETECTION ASSAY ASYM HEALTH EMP: COVID-19 Coronavirus Result: NOT DETECTED

## 2020-05-16 ENCOUNTER — Ambulatory Visit (INDEPENDENT_AMBULATORY_CARE_PROVIDER_SITE_OTHER): Payer: BLUE CROSS/BLUE SHIELD | Admitting: Student in an Organized Health Care Education/Training Program

## 2020-05-16 ENCOUNTER — Encounter (INDEPENDENT_AMBULATORY_CARE_PROVIDER_SITE_OTHER): Payer: Self-pay | Admitting: Student in an Organized Health Care Education/Training Program

## 2020-05-16 ENCOUNTER — Other Ambulatory Visit: Payer: BLUE CROSS/BLUE SHIELD | Attending: Student in an Organized Health Care Education/Training Program

## 2020-05-16 VITALS — BP 107/59 | HR 87 | Temp 98.2°F | Resp 18 | Wt 190.0 lb

## 2020-05-16 DIAGNOSIS — E039 Hypothyroidism, unspecified: Secondary | ICD-10-CM | POA: Insufficient documentation

## 2020-05-16 DIAGNOSIS — R079 Chest pain, unspecified: Secondary | ICD-10-CM

## 2020-05-16 DIAGNOSIS — K219 Gastro-esophageal reflux disease without esophagitis: Secondary | ICD-10-CM

## 2020-05-16 LAB — TSH, BLOOD: TSH: 0.19 u[IU]/mL — ABNORMAL LOW (ref 0.27–4.20)

## 2020-05-16 MED ORDER — SYNTHROID 88 MCG OR TABS
88.0000 ug | ORAL_TABLET | Freq: Every day | ORAL | 0 refills | Status: DC
Start: 2020-05-16 — End: 2020-07-11

## 2020-05-16 NOTE — Interdisciplinary (Signed)
Blood drawn from right arm with 21 gauge needle. 1 tubes taken.   Patient identity authenticated by Helen Grace Rasch.

## 2020-05-16 NOTE — Patient Instructions (Addendum)
You can continue omeprazole 20 mg daily for heartburn. If your symptoms are controlled you can consider either continuing daily or taking as needed. Another medication effective for as needed heartburn is Pepcid complete.  Let us know if you are having chest pain with exertion.

## 2020-05-16 NOTE — Progress Notes (Signed)
Chief Complaint   Patient presents with    ER F/U     pt c/o chest tightnes x 1 week/pain when lying down or bending over.pt states took omeprazole for GERD and symptoms have gone away    Medication Follow-up     pt wants to know if she should continue taking omeprozole for GERD       SUBJECTIVE::Delene Jerricka Sowders is a 54 year old female here for f/u visit.     Seen in ED 05/09/20 for "four days of substernal chest pain. Sharp and stabbing. Non-radiating, no shortness of breath. No pleuritic symptoms.  Symptoms not worse with exertion.  No history of DVT or PE."  Normal CXR, EKG Sinus rhythm w/ nonspecific T wave inversion V2."Bedside cardiac ultrasound with no evidence of pericardial effusion, right-sided heart strain, focal wall motion abnormality, proximal aortic root dilation, proximal aortic dissection, pleural effusion.  No depressed ejection fraction, IVC with normal collapsibility. No B-lines bilaterally." Neg trop.  Thought possible mild costochondritis, given pain reproducible with palpation of sternum.    Today:    #Chest pain, resolved  Thinks her chest pain was related to heartburn. Had re-started eating tomatoes prior to onset of chest pain and wonders if this may have triggered GERD. Has had GERD in the past. Started omeprazole 20 mg after coming back from the ER and no symptoms for the past 3 days. Took Aleve x 1 during the course of sxs and thinks this also may have helped. No anemia, hematochezia, dysphagia, or unintentional weight loss.  A/P: noncardiac chest pain (not pressure-like, not worse w/exertion or improved with rest); Likely GERD at this time, less likely costochondritis.  - continue omeprazole 20 mg daily for GERD. If symptoms are controlled after 8 weeks can consider taking as needed or continue daily if recurrence of sxs. Also discussed Pepcid complete for as needed heartburn relief  - discussed lifestyle/dietary modifications for GERD  - return precautions discussed if chest  pain/pressure recurrence and with exertion    #Hypothyroidism:  TSH 0.26 on 04/05/20 on levothyroxine 100 mcg daily.  - due for repeat TSH, will obtain after visit today    Plantar fasciitis improving with shoes.    ROS  ROS negative except as documented above in HPI    Physical Exam  OBJECTIVE:: BP 107/59 (BP Location: Left arm, BP Patient Position: Sitting, BP cuff size: Regular)    Pulse 87    Temp 98.2 F (36.8 C) (Temporal)    Resp 18    Wt 86.2 kg (190 lb)    SpO2 97%    BMI 28.06 kg/m   General Appearance: alert, no distress, pleasant affect, cooperative.  Heart:  normal rate and regular rhythm, no murmurs, clicks, or gallops.  Lungs: clear to auscultation. No wheezes, rhonchi, or rales.  Abdomen: BS normal.  Abdomen soft, non-tender, no epigastric tenderness.  No masses or hepatosplenomegaly.  Extremities:  no edema.    Labs and chart reviewed.     Assessment/Plan:   See above for details.    Raissa was seen today for er f/u and medication follow-up.    Diagnoses and all orders for this visit:    Chest pain, unspecified type    Gastroesophageal reflux disease without esophagitis    Acquired hypothyroidism        Return if symptoms worsen or fail to improve.  F/u as needed based on TSH results.       Patient Active Problem List  Diagnosis Date Noted    Chronic midline low back pain without sciatica 10/17/2015    Hyperlipidemia, unspecified hyperlipidemia type 09/08/2015    Impingement syndrome, shoulder, right 05/02/2015    Radial styloid tenosynovitis 05/02/2015    Preventative health care 11/28/2014    Allergic rhinitis due to pollen 04/11/2014    Gastroesophageal reflux disease without esophagitis 04/11/2014    Bilateral low back pain without sciatica 09/21/2013    Tension headache 12/26/2011    Gluten intolerance 09/17/2011    Dyspepsia 07/15/2011    Intestinal gas excretion 07/15/2011    Abdominal pain 03/13/2011    Right knee pain 03/13/2011    Routine lab draw 03/13/2011    Hearing  loss 12/14/2010    Cerumen impaction 12/14/2010    Pre-operative examination 12/06/2010     Chena Mellenthin is a 54 year old female G0P0000 with h/o menorrhagia who presents today for preop visit for hysteroscopy, D&C, possible resection of fibroid, and Novasure ablation.     Work-up:  11/22/10: EMB benign  11/22/10 pap: negative    11/28/10 Pelvic sono:  The uterus shows several intramural fibroids, largest measuring 3.7 x 3.2 cm   containing a subcentimeter cystic degeneration, abutting the endometrium. The  endometrial stripe is 1.8-mm. Both ovaries are normal measuring 2.6 x 1.3 x   1.7 cm on the right and 2.6 x 2.7 x 2.0 cm on the left. No free fluid is   seen        Skin lesion 12/05/2010    Palpitations 10/02/2010    Low back pain 06/15/2010    Fatigue 06/15/2010    Depression 07/25/2009    Sebaceous cyst 03/23/2009    Insomnia 11/16/2008    Acquired hypothyroidism 09/22/2008    Right foot pain 08/18/2008    Dysmenorrhea 08/18/2008    Grave's disease 01/15/2008    Vitamin D deficiency 01/06/2008    Benign neoplasm of skin, site unspecified 08/27/2007    Cold sensitivity 08/27/2007    Health maintenance examination 02/04/2007     Last pap 2006, has had squamous cells, repeats normal  Mammogram: 2/08, all normal  CHOL 225  HDL65  LDLCALC      143    TRIG 87  (12/15/06)  Cycles: regular q28 days, last two cycles "heavier flow" "more cramping" and 1day longer (5 instead of 4)         Infectious mononucleosis 09/11/2006       Past Medical History:   Diagnosis Date    Hypothyroidism     h/o hyperthyroidism s/p radioactive iodine    Infectious mononucleosis 08/2006    Insomnia     Menorrhagia        Family History   Problem Relation Name Age of Onset    Breast Cancer Mother  109    Other Sister          Uterine fibroids/hysterectomy    Crohn's Disease Sister      Breast Cancer Sister  45    Cancer Sister          Endometrial cancer at 31    Hypertension Father      Hypertension Sister       Cholesterol/Lipid Disorder Sister      Ovarian Cancer Neg Hx          but sister w/ovarian cysts age 57       Social History:  Social History     Tobacco Use    Smoking status: Never Smoker  Smokeless tobacco: Never Used    Tobacco comment: 2nd hand smoke   Substance Use Topics    Alcohol use: Yes     Comment: 1 bottle wine /week    Drug use: No       Allergies:  Allergies   Allergen Reactions    Vicodin [Hydrocodone-Acetaminophen] Hallucinations       Current Outpatient Medications   Medication Sig    acetaminophen (TYLENOL) 325 MG tablet Take 650 mg by mouth every 4 hours as needed for Mild Pain (Pain Score 1-3).    cetirizine (ZYRTEC) 10 MG tablet Take 10 mg by mouth daily.    cyclobenzaprine (FLEXERIL) 5 MG tablet Take 1 tablet (5 mg) by mouth 3 times daily as needed for Muscle Spasms.    desvenlafaxine (PRISTIQ) 50 MG TB24 Take 1 tablet (50 mg) by mouth daily.    desvenlafaxine Succinate ER (PRISTIQ) 25 MG TB24 Take 1 tablet (25 mg) by mouth daily.    diclofenac (VOLTAREN) 1 % gel Apply 4 g topically 4 times daily.    fluticasone propionate (FLONASE) 50 MCG/ACT nasal spray Spray 1 spray into each nostril 2 times daily.    metroNIDAZOLE (METROGEL) 1 % gel Apply 1 Application topically daily. Use a small amount as directed    SYNTHROID 100 MCG tablet Take 1 tablet (100 mcg) by mouth every morning (before breakfast).    tretinoin (RETIN-A) 0.025 % cream Apply a thin layer at bedtime as directed     No current facility-administered medications for this visit.

## 2020-05-16 NOTE — Progress Notes (Signed)
Rx sent for Synthroid 88 mcg daily (dose decrease from 100 mcg daily due to low TSH).  Message sent to pt.

## 2020-05-24 ENCOUNTER — Other Ambulatory Visit: Payer: Self-pay

## 2020-05-24 ENCOUNTER — Other Ambulatory Visit: Payer: BLUE CROSS/BLUE SHIELD

## 2020-05-24 DIAGNOSIS — Z1159 Encounter for screening for other viral diseases: Secondary | ICD-10-CM

## 2020-05-25 ENCOUNTER — Other Ambulatory Visit (INDEPENDENT_AMBULATORY_CARE_PROVIDER_SITE_OTHER): Payer: BLUE CROSS/BLUE SHIELD

## 2020-05-25 LAB — COVID-19 DETECTION ASSAY SYM HEALTH EMP: COVID-19 Coronavirus Result: NOT DETECTED

## 2020-05-29 ENCOUNTER — Other Ambulatory Visit (INDEPENDENT_AMBULATORY_CARE_PROVIDER_SITE_OTHER): Payer: Self-pay | Admitting: Obstetrics & Gynecology

## 2020-05-29 DIAGNOSIS — N951 Menopausal and female climacteric states: Secondary | ICD-10-CM

## 2020-05-29 MED ORDER — DESVENLAFAXINE SUCCINATE 50 MG OR TB24
50.00 mg | ORAL_TABLET | Freq: Every day | ORAL | 3 refills | Status: AC
Start: 2020-05-29 — End: ?

## 2020-06-02 ENCOUNTER — Other Ambulatory Visit: Payer: Self-pay | Admitting: Acute Care

## 2020-06-02 LAB — COVID-19 CORONAVIRUS DETECTION ASSAY ASYMPTOMATIC: COVID-19 Coronavirus by PCR – External Lab: NOT DETECTED

## 2020-06-06 ENCOUNTER — Other Ambulatory Visit (INDEPENDENT_AMBULATORY_CARE_PROVIDER_SITE_OTHER): Payer: BLUE CROSS/BLUE SHIELD

## 2020-06-06 DIAGNOSIS — Z1159 Encounter for screening for other viral diseases: Secondary | ICD-10-CM

## 2020-06-06 LAB — COVID-19 DETECTION ASSAY SYM HEALTH EMP: COVID-19 Coronavirus Result: NOT DETECTED

## 2020-06-06 LAB — COVID-19 BINAXNOW ANTIGEN (POCT): COVID-19 Antigen (POCT): NEGATIVE

## 2020-06-06 NOTE — Interdisciplinary (Signed)
Pt verified by 2 ID

## 2020-06-14 ENCOUNTER — Other Ambulatory Visit: Payer: Self-pay | Admitting: Acute Care

## 2020-06-15 LAB — COVID-19 CORONAVIRUS DETECTION ASSAY ASYMPTOMATIC: COVID-19 Coronavirus by PCR – External Lab: NOT DETECTED

## 2020-06-16 ENCOUNTER — Other Ambulatory Visit: Payer: BLUE CROSS/BLUE SHIELD

## 2020-06-20 ENCOUNTER — Other Ambulatory Visit (INDEPENDENT_AMBULATORY_CARE_PROVIDER_SITE_OTHER): Payer: BLUE CROSS/BLUE SHIELD

## 2020-06-20 DIAGNOSIS — Z1159 Encounter for screening for other viral diseases: Secondary | ICD-10-CM

## 2020-06-20 LAB — COVID-19 DETECTION ASSAY SYM HEALTH EMP: COVID-19 Coronavirus Result: NOT DETECTED

## 2020-07-03 ENCOUNTER — Other Ambulatory Visit (INDEPENDENT_AMBULATORY_CARE_PROVIDER_SITE_OTHER): Payer: BLUE CROSS/BLUE SHIELD

## 2020-07-03 ENCOUNTER — Other Ambulatory Visit (INDEPENDENT_AMBULATORY_CARE_PROVIDER_SITE_OTHER): Payer: Self-pay

## 2020-07-03 DIAGNOSIS — Z1159 Encounter for screening for other viral diseases: Secondary | ICD-10-CM

## 2020-07-03 LAB — COVID-19 DETECTION ASSAY ASYM HEALTH EMP: COVID-19 Coronavirus Result: NOT DETECTED

## 2020-07-11 ENCOUNTER — Other Ambulatory Visit (INDEPENDENT_AMBULATORY_CARE_PROVIDER_SITE_OTHER): Payer: Self-pay | Admitting: Student in an Organized Health Care Education/Training Program

## 2020-07-11 ENCOUNTER — Other Ambulatory Visit: Payer: BLUE CROSS/BLUE SHIELD | Attending: Student in an Organized Health Care Education/Training Program

## 2020-07-11 ENCOUNTER — Encounter (INDEPENDENT_AMBULATORY_CARE_PROVIDER_SITE_OTHER): Payer: Self-pay | Admitting: Student in an Organized Health Care Education/Training Program

## 2020-07-11 DIAGNOSIS — E039 Hypothyroidism, unspecified: Secondary | ICD-10-CM

## 2020-07-11 LAB — TSH, BLOOD: TSH: 2.29 u[IU]/mL (ref 0.27–4.20)

## 2020-07-11 MED ORDER — SYNTHROID 88 MCG OR TABS
88.0000 ug | ORAL_TABLET | Freq: Every day | ORAL | 0 refills | Status: DC
Start: 2020-07-11 — End: 2020-10-06

## 2020-07-11 NOTE — Addendum Note (Signed)
Addended by: Ree Shay on: 07/11/2020 01:49 PM     Modules accepted: Orders

## 2020-07-11 NOTE — Progress Notes (Signed)
Refill synthroid 88 mcg daily.

## 2020-07-11 NOTE — Interdisciplinary (Signed)
Blood drawn from left arm with 22 gauge needle. 1 tubes taken.   Patient identity authenticated by Lisa Romero.

## 2020-07-13 ENCOUNTER — Telehealth (INDEPENDENT_AMBULATORY_CARE_PROVIDER_SITE_OTHER): Payer: Self-pay | Admitting: Student in an Organized Health Care Education/Training Program

## 2020-07-13 NOTE — Telephone Encounter (Signed)
Fax Received    Date Received: 07/12/20    Date Processed: 07/13/20    From: [facility]       Document Type: Prior authorization request for SYNTHROID 88 MCG tablet.    Fax detail:       Outside fax, please review.  Thank you.

## 2020-07-13 NOTE — Telephone Encounter (Signed)
Prior Auth Request for SYNTHROID has been placed in queue for processing. Please allow up to 72 business hours for initiation.     If this is an urgent request due to immediate therapy please re-route as high priority.       Thank you!     [facility]   Refill and Prior Auth Clinical Services

## 2020-07-13 NOTE — Telephone Encounter (Signed)
Pharmacy billing 120 tabs for 120 days supply, max covered 90ds . Spoke with [facility],  Synthroid ready for $50 for 90 day supply. No PA needed.

## 2020-07-19 ENCOUNTER — Other Ambulatory Visit (INDEPENDENT_AMBULATORY_CARE_PROVIDER_SITE_OTHER): Payer: BLUE CROSS/BLUE SHIELD

## 2020-07-19 ENCOUNTER — Other Ambulatory Visit: Payer: Self-pay

## 2020-07-19 DIAGNOSIS — Z1159 Encounter for screening for other viral diseases: Secondary | ICD-10-CM

## 2020-07-19 LAB — COVID-19 DETECTION ASSAY SYM HEALTH EMP: COVID-19 Coronavirus Result: NOT DETECTED

## 2020-07-20 ENCOUNTER — Encounter (INDEPENDENT_AMBULATORY_CARE_PROVIDER_SITE_OTHER): Payer: Self-pay | Admitting: Student in an Organized Health Care Education/Training Program

## 2020-07-20 DIAGNOSIS — M654 Radial styloid tenosynovitis [de Quervain]: Secondary | ICD-10-CM

## 2020-07-21 NOTE — Telephone Encounter (Signed)
From: [name]  To: [name], MD  Sent: 07/20/2020 8:53 PM PST  Subject: PT for radial styloid tenosynovitis    Hello Dr. Charlynn Court,  Its noted in [facility] on Dec. 13, 2016 that I have tenosynovitis in both thumbs. I went to PT in 2017. Have recently had a flare up and as of today the pain is pretty severe in both hands. Please send a referral for PT at [facility].  Thank you!  Dyann Ruddle

## 2020-07-21 NOTE — Telephone Encounter (Signed)
MyChart message forwarded to PCP for review/advice.  Order/referral is pended, sign if you agree.

## 2020-07-21 NOTE — Telephone Encounter (Signed)
Pt likely need OT for bilateral dequervains tenosynovitis.    I called [facility], [address] [place], [place] [address] Ph: [phone] they do not offer OT treatment but their [facility] location does.    [facility] phone # [phone].

## 2020-07-25 ENCOUNTER — Encounter (HOSPITAL_COMMUNITY): Payer: Self-pay

## 2020-07-28 ENCOUNTER — Telehealth (HOSPITAL_BASED_OUTPATIENT_CLINIC_OR_DEPARTMENT_OTHER): Payer: Self-pay

## 2020-07-28 ENCOUNTER — Other Ambulatory Visit (INDEPENDENT_AMBULATORY_CARE_PROVIDER_SITE_OTHER): Payer: BLUE CROSS/BLUE SHIELD

## 2020-07-28 ENCOUNTER — Encounter (HOSPITAL_BASED_OUTPATIENT_CLINIC_OR_DEPARTMENT_OTHER): Payer: Self-pay | Admitting: Hospital

## 2020-07-28 DIAGNOSIS — Z1159 Encounter for screening for other viral diseases: Secondary | ICD-10-CM

## 2020-07-28 LAB — COVID-19 DETECTION ASSAY ASYM HEALTH EMP: COVID-19 Coronavirus Result: NOT DETECTED

## 2020-07-28 NOTE — Telephone Encounter (Signed)
I have attempted to reach the patient and schedule a therapy appointment. I was able to leave a message and provide our main line [phone], so the patient may call us back to schedule at their earliest convenience.    Thank you,  Rehabilitation Services.

## 2020-08-02 ENCOUNTER — Encounter (INDEPENDENT_AMBULATORY_CARE_PROVIDER_SITE_OTHER): Payer: Self-pay | Admitting: Student in an Organized Health Care Education/Training Program

## 2020-08-02 ENCOUNTER — Ambulatory Visit
Payer: BLUE CROSS/BLUE SHIELD | Attending: Student in an Organized Health Care Education/Training Program | Admitting: Rehabilitative and Restorative Service Providers"

## 2020-08-02 DIAGNOSIS — M79645 Pain in left finger(s): Secondary | ICD-10-CM

## 2020-08-02 DIAGNOSIS — M79644 Pain in right finger(s): Secondary | ICD-10-CM

## 2020-08-02 DIAGNOSIS — M654 Radial styloid tenosynovitis [de Quervain]: Secondary | ICD-10-CM

## 2020-08-02 DIAGNOSIS — G8929 Other chronic pain: Secondary | ICD-10-CM

## 2020-08-02 NOTE — Interdisciplinary (Signed)
Occupational Therapy Splint Evaluation    Outpatient Referring Physician: [name]    Visit number: 1  Visits remaining on current referral: 11 out of 12    Current Referral Expiration Date: 07/21/2021    Outpatient Diagnosis     ICD-10-CM ICD-9-CM    1. De Quervain's tenosynovitis, bilateral  M65.4 727.04        Preferred Language:English    Standardized tests and measures       Splint Eval     Row Name 08/02/20 1400          Start of Service    Start of Care 08/02/20     Onset date  chronic     Reason for referral Activity tolerance limitation;Pain     Row Name 08/02/20 1400          Medical History    Hand Dominance Right     History of presenting condition progressing bilateral thumb pain, greater on the R, without wrist pain     Past medical/surgical history affecting therapy  No significant medical or surgical history;Arthritis     Medications affecting therapy  No significant medications     Activity Restrictions None     Row Name 08/02/20 1400          Functional History    Prior Level of Function No deficits     Communication  No communication impairment     Employment Status Employed     Work modifications None     Instrumental Activities of Daily Living  Independent with IADLs     Row Name 08/02/20 1400          Pain Assessment    Pain Asssessment Tool Numeric Pain Rating Scale     Row Name 08/02/20 1400          Numeric Pain Rating Scale     Pain Intensity - rating at present 6     Pain Intensity - rating at worst  5     Pain Intensity- rating after treatment 5     Frequency  Constant     Description  Aching;Sharp;Throbbing     Location  thumb base     Row Name 08/02/20 1400          Soft Tissue Observations    Soft Tissue Texture Unremarkable     Other Soft Tissue Findings shoulder sign R thumb, hypermobility at [facility]     Row Name 08/02/20 1400          Musculoskeletal Observations    Sensation- involved extremity Intact     Row Name 08/02/20 1400          Assessment     Assessment   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 Noted mild deformity . OT hand therapy to h     Rehab Potential [place] Name 08/02/20 1400          Long Term Splinting Goal    Impairment Splinting/positioning need to prevent functional impairment     Splinting Goal Patient able to independently don and doff splint correctly     To protect healing structures     NO Visits 7-10     Goal Status New     Row Name 08/02/20 1400          Short Term Splinting Goal    Impairment Education     CUSTOM ST GOAL Patient will  be independent in joint protection strategies     Row Name 08/02/20 1400          Patient stated Goal    Patient stated goal to decrease thumb pain     Row Name 08/02/20 1400          Treatment Plan Discussion    Include in My Healthcare Myself     Treatment Plan Discussion & Agreement Patient     Brittany [name] Name 08/02/20 1400          Outpatient Treatment Plan    Amount of Treatment 1 time per day     Frequency of Treatment 1 time per week     Duration of Treatment 6 weeks     Status of Treatment Patient evaluated and will benefit from ongoing skilled therapy     Row Name 08/02/20 1400          Treatment Rational    Learner(s) Patient     Patient/family training in proper wear and care of splint; patient is able to don and doff splint correctly Completed this visit;Initiated;Ongoing     Row Name 08/02/20 1400          Treatment Time     Total TIMED Treatment  (min) 30     Total Treatment Time (min) 45               Patient with R thumb "shoulder sign". Positive grind at [facility] joint, greater on the R thumb. Patient fitted with a hand based thumb spica splint. Educated in joint protection strategies, splint wear. Educated on use of heat, paraffin.

## 2020-08-02 NOTE — Telephone Encounter (Signed)
From: [name]  To: [name], MD  Sent: 08/02/2020 3:52 PM PDT  Subject: X-Ray of hands - arthritis in thumbs    Hello Dr. Charlynn Court,  Saw Jacobo Forest, OT hand specialist today. 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 Last hand X-ray was 2017. She recommended I get new x-ray, preferably of both hands. Please let me know next step.   Thank you,  Brittany [name]

## 2020-08-07 ENCOUNTER — Ambulatory Visit (HOSPITAL_BASED_OUTPATIENT_CLINIC_OR_DEPARTMENT_OTHER)
Admission: RE | Admit: 2020-08-07 | Discharge: 2020-08-07 | Disposition: A | Discharge status: Home Health | Payer: BLUE CROSS/BLUE SHIELD | Attending: Student in an Organized Health Care Education/Training Program | Admitting: Student in an Organized Health Care Education/Training Program

## 2020-08-07 ENCOUNTER — Ambulatory Visit
Admission: RE | Admit: 2020-08-07 | Discharge: 2020-08-07 | Disposition: A | Discharge status: Home / Self Care | Payer: BLUE CROSS/BLUE SHIELD | Attending: Student in an Organized Health Care Education/Training Program | Admitting: Student in an Organized Health Care Education/Training Program

## 2020-08-07 DIAGNOSIS — M79645 Pain in left finger(s): Secondary | ICD-10-CM

## 2020-08-07 DIAGNOSIS — G8929 Other chronic pain: Secondary | ICD-10-CM

## 2020-08-07 DIAGNOSIS — M79644 Pain in right finger(s): Secondary | ICD-10-CM | POA: Insufficient documentation

## 2020-08-07 DIAGNOSIS — M19042 Primary osteoarthritis, left hand: Secondary | ICD-10-CM

## 2020-08-07 DIAGNOSIS — M19041 Primary osteoarthritis, right hand: Secondary | ICD-10-CM

## 2020-08-15 ENCOUNTER — Other Ambulatory Visit (INDEPENDENT_AMBULATORY_CARE_PROVIDER_SITE_OTHER): Payer: BLUE CROSS/BLUE SHIELD

## 2020-08-15 ENCOUNTER — Encounter (INDEPENDENT_AMBULATORY_CARE_PROVIDER_SITE_OTHER): Payer: Self-pay | Admitting: Acute Care

## 2020-08-15 DIAGNOSIS — Z1159 Encounter for screening for other viral diseases: Secondary | ICD-10-CM

## 2020-08-15 LAB — COVID-19 DETECTION ASSAY SYM HEALTH EMP: COVID-19 Coronavirus Result: NOT DETECTED

## 2020-08-15 LAB — COVID-19 BINAXNOW ANTIGEN (POCT): COVID-19 Antigen (POCT): NEGATIVE

## 2020-08-18 ENCOUNTER — Ambulatory Visit
Payer: BLUE CROSS/BLUE SHIELD | Attending: Student in an Organized Health Care Education/Training Program | Admitting: Rehabilitative and Restorative Service Providers"

## 2020-08-18 ENCOUNTER — Other Ambulatory Visit: Payer: Self-pay

## 2020-08-18 DIAGNOSIS — M79646 Pain in unspecified finger(s): Secondary | ICD-10-CM

## 2020-08-18 NOTE — Interdisciplinary (Signed)
Occupational Therapy Daily Follow up Note    Referring Physician: [name]           Visit number:   Visits remaining on current referral: 10 out of 12    Current Referral Expiration Date: 07/21/2021          Preferred [place]    Patient to continue therapy for     ICD-10-CM ICD-9-CM    1. Thumb pain, unspecified laterality  M79.646 729.5         SUBJECTIVE AND OBJECTIVE     Row Name 08/18/20 1500          Activity Restrictions     Activity Restrictions None     Row Name 08/18/20 1500          Subjective    Status since last treatment Patient received comfort cool splints.      [place] Name 08/18/20 1500          Outpatient Pain Assessment    Pain Asssessment Tool Numeric Pain Rating Scale     Row Name 08/18/20 1500          Numeric Pain Rating Scale     Pain Intensity - rating at present 3     Pain Intensity - rating at worst  5     Pain Intensity- rating after treatment 2     Frequency  Intermittent     Description  Aching;Throbbing     Pain Description bilateral thumbs     Row Name 08/18/20 1500          Objective Findings    Objective Findings Pt seen for: paraffin with MHP, instruction, demonstration and participation in dynamic stability program for thumb OA.                     ASSESSMENT AND PLAN     Row Name 08/18/20 1500          Assessment    Assessment Patient with good performance of dynamic stability exercises. With some difficulty controlling for MPJT hyperextension, though with good benefit from prompting.      Progress Towards Goals Patient showing improvement from baseline     Potential to improve with therapy Good     Row Name 08/18/20 1500          Outpatient Treatment Plan    Amount of Treatment 1 time per day     Frequency of Treatment 1 time per week     Duration of Treatment 6 weeks     Status of Treatment Patient evaluated and will benefit from ongoing skilled therapy     Row Name 08/18/20 1500          Focus for Next Treatment    Focus for Next Treatment Manual  therapy;Modalities;Therapeutic exercise     Row Name 08/18/20 1500          Treatment Plan Discussion    Include in My Healthcare Myself;My healthcare team     Treatment Plan Discussion & Agreement Patient;Patient stated understanding and agreement with the therapy plan     Patient/Family Questions Yes - All questions asked & answered     [place] Name 08/18/20 1500          Patient/Family Education    Learner(s) Patient     Patient/Family Training in Appropriate Therapeutic Interventions Ongoing     Education Topic(s) Exercise Program     [place] Name 08/18/20 1500  Therapeutic Procedures    Therapeutic Exercise [phone]) Home Exercise Program (HEP) demonstration and performance;Dynamic exercises         Total TIMED Treatment (min) 30     Row Name 08/18/20 1500          Modalities    Hot/Cold Packs (97010) Moist hot pack application     Paraffin Bath [phone]) Completed     Row Name 08/18/20 1500          Treatment Time    Treatment Start Time 1330     Total TIMED Treatment (min) 45     Total Treatment Time (min) 45

## 2020-08-21 ENCOUNTER — Other Ambulatory Visit (INDEPENDENT_AMBULATORY_CARE_PROVIDER_SITE_OTHER): Payer: Self-pay | Admitting: "Endocrinology

## 2020-08-21 DIAGNOSIS — M545 Low back pain, unspecified: Secondary | ICD-10-CM

## 2020-08-21 NOTE — Telephone Encounter (Signed)
[facility]     Cyclobenzaprine (Flexeril) is not currently included in the Pharmacy Refill Clinic protocols. Re-routing to the responsible staff for processing. Thank you!

## 2020-08-22 ENCOUNTER — Telehealth (HOSPITAL_BASED_OUTPATIENT_CLINIC_OR_DEPARTMENT_OTHER): Payer: Self-pay | Admitting: Rehabilitative and Restorative Service Providers"

## 2020-08-22 ENCOUNTER — Encounter (HOSPITAL_BASED_OUTPATIENT_CLINIC_OR_DEPARTMENT_OTHER): Payer: Self-pay | Admitting: Hospital

## 2020-08-22 MED ORDER — CYCLOBENZAPRINE HCL 5 MG OR TABS
5.0000 mg | ORAL_TABLET | Freq: Three times a day (TID) | ORAL | 0 refills | Status: DC | PRN
Start: 2020-08-22 — End: 2021-03-26

## 2020-08-22 NOTE — Telephone Encounter (Signed)
L/M for pt notifying them that their scheduled appointment for 5/13 with Estill Bamberg has been Cnx as she will be out of clinic.    Provided CC# for rescheduling.        Thank you

## 2020-08-22 NOTE — Telephone Encounter (Signed)
LOV:05/16/2020  TGG:YIRS  LR:11/23/2019    Please accept or decline

## 2020-08-28 ENCOUNTER — Encounter (HOSPITAL_BASED_OUTPATIENT_CLINIC_OR_DEPARTMENT_OTHER): Payer: Self-pay | Admitting: Rehabilitative and Restorative Service Providers"

## 2020-08-29 ENCOUNTER — Other Ambulatory Visit (INDEPENDENT_AMBULATORY_CARE_PROVIDER_SITE_OTHER): Payer: BLUE CROSS/BLUE SHIELD

## 2020-08-29 DIAGNOSIS — Z1159 Encounter for screening for other viral diseases: Secondary | ICD-10-CM

## 2020-08-30 LAB — COVID-19 DETECTION ASSAY ASYM HEALTH EMP: COVID-19 Coronavirus Result: NOT DETECTED

## 2020-09-14 ENCOUNTER — Ambulatory Visit (INDEPENDENT_AMBULATORY_CARE_PROVIDER_SITE_OTHER): Payer: BLUE CROSS/BLUE SHIELD

## 2020-09-15 ENCOUNTER — Other Ambulatory Visit: Payer: Self-pay

## 2020-09-15 ENCOUNTER — Ambulatory Visit (INDEPENDENT_AMBULATORY_CARE_PROVIDER_SITE_OTHER): Payer: BLUE CROSS/BLUE SHIELD

## 2020-09-15 DIAGNOSIS — Z23 Encounter for immunization: Secondary | ICD-10-CM

## 2020-09-21 ENCOUNTER — Other Ambulatory Visit: Payer: Self-pay

## 2020-09-21 ENCOUNTER — Other Ambulatory Visit (INDEPENDENT_AMBULATORY_CARE_PROVIDER_SITE_OTHER): Payer: BLUE CROSS/BLUE SHIELD | Attending: Acute Care

## 2020-09-21 DIAGNOSIS — Z1159 Encounter for screening for other viral diseases: Secondary | ICD-10-CM | POA: Insufficient documentation

## 2020-09-21 LAB — COVID-19 DETECTION ASSAY SYM HEALTH EMP: COVID-19 Coronavirus Result: NOT DETECTED

## 2020-09-21 LAB — COVID-19 BINAXNOW ANTIGEN (POCT): COVID-19 Antigen (POCT): NEGATIVE

## 2020-09-22 ENCOUNTER — Ambulatory Visit (HOSPITAL_BASED_OUTPATIENT_CLINIC_OR_DEPARTMENT_OTHER): Payer: BLUE CROSS/BLUE SHIELD | Admitting: Rehabilitative and Restorative Service Providers"

## 2020-09-25 NOTE — Progress Notes (Signed)
[facility]   [place], [place] [address]  [phone]     Primary MD: Albin Fischer Nabil  Consult Requested By: Marguerite Olea    HISTORY:  Brittany [name] is a 55 year old female here for TBSE due to sun damaged skin.     Last office visit 10/01/2019 with me: benign exam    Lesion of concern today:   1. Today, the patient brings attention to a bruise-like lesion on her right chin that onset after falling asleep reading her phone last night, possibly hitting her face with the phone.   2. Notes spot on the right upper arm is larger and harder. Has been present for years. Endorses scratching the site.   3. Reports spots on her arms will start as red, but change to brown. Specifically points out one on her right forearm.  4. States she gets small white spots on her face. Tries to extract them.  5. Points out a recurrent lesion of the mons pubis. Has not grown.     Works in [employer], [employer]  Sunburns: 1 major sunburn with peeling as a child   Sun Protection: intermittent  Melanoma IN:OMVEHMCN  NMSC OB:SJGGEZMO  Family Hx of melanoma: negative   Family hx of skin disease:positive -mother had 3 NMSC (she did not use sunscreen), father with 1 NMSC    Otherwise denies other new itching, bleeding, painful, growing, ulcerating, or concerning lesions.    PMH: reviewed    Medications: reviewed    Allergies: Vicodin [hydrocodone-acetaminophen]    REVIEW OF SYSTEMS:  DERMATOLOGIC: no other skin complaints.  Constitutional: feels well, no concerns     PHYSICAL EXAM:  Skin Type:                    2  General Appearance: within normal limits  Neuro: Alert and oriented x 3  Psych: Mood and affect within normal limits  Eyes: Inspection of lids, sclera, and conjunctiva within normal limits   Areas skin examined included: scalp, forehead, eyebrows, cheeks, nose, lips, chin, ears, sclera, conjunctiva, eyelids, neck, RUE, LUE, hands, chest, abdomen, back, RLE, LLE, feet, buttock, fingernails, mons  pubis, and toenails    Pertinent findings below:  -white subepidermal cystic 1-3 mm papules on the face  -purpura on the right chin  -scattered even-bordered, even-pigmented macules and papules on the head, trunk and extremities  -feathery brown macules on the face, forearms and shoulders  -well demarcated, brown, stuck on appearing plaques on head, trunk, mons pubis, and extremities  -scattered 2-4 mm cherry red macules  -yellow papules with central dell on the face  -pink brown papule with + dimple sign on the right upper arm, lower extremities   Dyschromia, thinning, loss of elasticity, and wrinkling of the skin in sun exposed areas    ASSESSMENT AND TREATMENT PLAN:    [place]    - Reassured benign. Will resolve with time or may see aesthetician for extraction.  - Advised at-home extraction may damage skin    Purpura  Multiple Benign Nevi  Lentigines  Seborrheic Keratoses  Cherry angioma  Sebaceous hyperplasia  Dermatofibromas  - benign lesions reassured    Sun damaged skin  Family history of NMSC  - educational information about sun protection and skin cancers provided in handout, broad spectrum SPF 30 or higher  - RTC if notices any new or concerning growths    - Diagnoses, natural course, potential treatments and risks were discussed  with the patient. Shared decision making. Questions answered.    RTC 12 months TBSE    Marguerite Olea, MD, PhD    ------------------------      Carlynn Purl Attestation  The notes I am recording reflect only actions made by and judgments taken by this provider, Dr. Jerene Dilling, for whom I am scribing today. I have performed no independent clinical work.    Mickie Hillier    ____________________________________________________________________    Provider Attestation for Scribed Note    As the attending provider, I agree with the scribed content.  Any changes or edits are noted in the text above.    Kimarion Chery

## 2020-09-26 ENCOUNTER — Encounter (INDEPENDENT_AMBULATORY_CARE_PROVIDER_SITE_OTHER): Payer: Self-pay | Admitting: Student in an Organized Health Care Education/Training Program

## 2020-09-26 DIAGNOSIS — Z1329 Encounter for screening for other suspected endocrine disorder: Secondary | ICD-10-CM

## 2020-09-26 NOTE — Telephone Encounter (Signed)
Routing to Dr. Hakim for review and advise.    Pended TSH, please sign if appropriate.

## 2020-09-26 NOTE — Telephone Encounter (Signed)
Signed.

## 2020-09-29 ENCOUNTER — Ambulatory Visit (INDEPENDENT_AMBULATORY_CARE_PROVIDER_SITE_OTHER): Payer: BLUE CROSS/BLUE SHIELD | Admitting: Dermatology

## 2020-09-29 ENCOUNTER — Encounter (INDEPENDENT_AMBULATORY_CARE_PROVIDER_SITE_OTHER): Payer: Self-pay | Admitting: Dermatology

## 2020-09-29 ENCOUNTER — Other Ambulatory Visit: Payer: BLUE CROSS/BLUE SHIELD | Attending: Student in an Organized Health Care Education/Training Program

## 2020-09-29 ENCOUNTER — Other Ambulatory Visit: Payer: Self-pay

## 2020-09-29 ENCOUNTER — Ambulatory Visit (HOSPITAL_BASED_OUTPATIENT_CLINIC_OR_DEPARTMENT_OTHER): Payer: BLUE CROSS/BLUE SHIELD | Admitting: Rehabilitative and Restorative Service Providers"

## 2020-09-29 DIAGNOSIS — Z1329 Encounter for screening for other suspected endocrine disorder: Secondary | ICD-10-CM | POA: Insufficient documentation

## 2020-09-29 DIAGNOSIS — D239 Other benign neoplasm of skin, unspecified: Secondary | ICD-10-CM

## 2020-09-29 DIAGNOSIS — D1801 Hemangioma of skin and subcutaneous tissue: Secondary | ICD-10-CM

## 2020-09-29 DIAGNOSIS — L720 Epidermal cyst: Secondary | ICD-10-CM

## 2020-09-29 DIAGNOSIS — Z808 Family history of malignant neoplasm of other organs or systems: Secondary | ICD-10-CM

## 2020-09-29 DIAGNOSIS — L814 Other melanin hyperpigmentation: Secondary | ICD-10-CM

## 2020-09-29 DIAGNOSIS — D692 Other nonthrombocytopenic purpura: Secondary | ICD-10-CM

## 2020-09-29 DIAGNOSIS — L821 Other seborrheic keratosis: Secondary | ICD-10-CM

## 2020-09-29 DIAGNOSIS — L738 Other specified follicular disorders: Secondary | ICD-10-CM

## 2020-09-29 DIAGNOSIS — D229 Melanocytic nevi, unspecified: Secondary | ICD-10-CM

## 2020-09-29 DIAGNOSIS — L578 Other skin changes due to chronic exposure to nonionizing radiation: Secondary | ICD-10-CM

## 2020-09-29 LAB — TSH, BLOOD: TSH: 5.61 u[IU]/mL — ABNORMAL HIGH (ref 0.27–4.20)

## 2020-09-29 NOTE — Interdisciplinary (Signed)
Blood drawn from right arm with 21 gauge needle. 1 tubes taken.   Patient identity authenticated by Helen Grace Rasch.

## 2020-09-29 NOTE — Patient Instructions (Signed)
[facility]    SUN PROTECTION      Quick info about SUNSCREENS:      Sunscreens with zinc oxide or titanium dioxide provide broader spectrum coverage against all wavelengths of UV light.  These mineral blockers are my preference for sunscreen.  I do not recommend spray sunscreens.      For acne prone skin:   Use a facial moisturizer with an SPF that says NON-COMEDOGENIC and OIL-FREE on it.        Examples:  ELTA MD UV Clear SPF46 facial moisturizer with sunscreen (can purchase online or at front desk of [place]/[facility] office)  La Roche-Posay Anthelios Clear Skin Dry Touch Sunscreen SPF 60   Aveeno Positively Radiant Daily Facial Moisturizer With Broad Spectrum SPF 30  Blue Lizard SPF 30  Coppertone Pure and Simple Baby SPF50   Neutrogena Sheer Zinc Sunscreen Face Lotion - SPF 50  Neutrogena Clear Face Sunscreen Lotion - SPF 55    For sensitive skin (best if the product has only titanium dioxide or zinc oxide in it):   Solbar SPF30 gel  Vanicream sunscreen for sensitive skin SPF50+    Sunscreen needs to be reapplied every 1.5 to 2 hours when you are outside.     Be aware of reflective light off water and snow.    Consider wearing sun protective clothing and hats (UPF instead of SPF), this provides more even/uniform coverage than sunscreen.  There are many companies now selling this clothing, I buy my clothing from a company called [employer].     Remember to avoid sun during the peak hours of 10 am - 2 pm.  Wear a wide brimmed hat when outside.  Wear sunglasses that will cover your entire eye and upper cheeks if possible.      Please see below for a list of sunscreen sprays that contain unsafe levels of benzene that we recommend avoiding:      More in-depth information:    https://www.aad.org/public    https://www.aad.org/public/spot-skin-cancer    https://www.melanoma.org    Why protect against the sun?  In the past, sun exposure was thought to be a healthy benefit of outdoor activity. However, studies have shown  many unhealthy effects of sun exposure, such as early aging of the skin and skin cancer.    What kind of damage does sun exposure cause?  Part of the sun's energy that reaches earth is composed of rays of invisible ultraviolet (UV) light. When ultraviolet light rays (UVA and UVB) enter the skin, they damage skin cells, causing visible and invisible injuries.  Sunburn is a visible type of damage, which appears just a few hours after sun exposure. In many people, this type of damage also causes tanning. Freckles, which occur in people with fair skin, are usually due to sun exposure. Freckles are nearly always a sign that sun damage has occurred, and therefore show the need for sun protection.  Ultraviolet light rays also cause invisible damage to skin cells. Some of the injury is repaired, but some of the cell damage adds up year after year. After 20 to 30 years or more, the built-up damage appears as wrinkles, age spots, and even skin cancer. Although window glass blocks UVB light, UVA rays are able to penetrate through glass.    How can I protect myself from excessive sun exposure?   Avoidance. Stay away from the sun in the middle of the day.  Sun exposure is more intense closer to the equator, in the   mountains, and in the summer. The sun's damaging effects are increased by reflection from water, white sand, and snow. Avoid long periods of direct sun exposure. Sit or play in the shade, especially when your shadow is shorter than you are tall.   Use sun protective clothing.  Cover up with light colored clothing when outdoors, including a hat to protect the scalp and face. In addition to filtering out the sun, tightly woven clothing reflects heat and helps keep you feeling cool. Sunglasses that block ultraviolet rays protect the eyes and eyelids. Multiple retailers now sell sun protective clothing for adults and children.  Rash guards should be worn during outdoor swimming activity.   Block sun damage by applying a  broad-spectrum UVA and UVB sunscreen with an SPF of 30 or higher and reapply approximately every two hours, even on cloudy days. If swimming or participating in intense physical activity, sunscreen may need to be applied more often.  How do I select the right sunscreen?    Choose a sunscreen with a SPF 30 or higher. The protective ability of sunscreen is rated by Sun Protection Factor (SPF) -- the higher the SPF, the stronger the protection. Spread it evenly over all uncovered skin, including ears and lips, but avoid the eyelids. Apply sunscreen about 30 minutes before sun exposure. Re-apply after swimming or excessive sweating.    Most importantly, choose a sunscreen that you will wear.  New sunscreens are added to the marketplace frequently, and selection of a particular brand is often a matter of personal preference.       What about the controversies regarding sunscreens?    Hats, clothing, and shade are the most reliable forms of sun protection.  Few people use enough sunscreen to benefit from the SPF protection listed on the label. Most practitioners in our group continue to recommend and utilize many sunscreen products, including chemical and physical sunscreens.  However, studies show that people typically use about a quarter of the recommended amount.     There has been much news coverage recently about the possible dangers of sunscreens. The Environmental Working Group publishes an annual sunscreen report.  The 2010 report, which received a considerable amount of media attention, cited concerns about a form of vitamin A called retinyl palmitate, found in many sunscreens. Safety testing is far from conclusive, and the FDA continues to investigate whether this compound may accelerate skin damage and elevate skin cancer risk when applied to skin exposed to sunlight.      Many have also raised concerns about chemical sunscreens, which contain substances such as oxybenzone, avobenzone, benzophenone, and parsol  1789. Some believe that these may be converted into hormones when absorbed through the skin. Some prefer physical agents such as zinc oxide or titanium dioxide, however efforts to make these agents more sheer and cosmetically acceptable have resulted in formulations that may contain tiny particles known as "nanoparticles."  Concerns also exist about their absorption.  Most of the above concerns are theoretical; however, if you are highly concerned about these issues, you can visit the Environmental Working Group's website devoted to sunscreen safety: www.ewg.org/2010 sunscreen.   Most dermatologists remain convinced that the risks of unprotected sun exposure far outweigh the above mentioned theoretical risks of sunscreens. The type of sunprotection used remains an individual choice for each parent.    What about vitamin D?    Vitamin D is essential for many processes in the body, and is important for bone growth in children.    Over the last few years, many studies have suggested an association between low vitamin D levels and increased risk of certain types of cancers, neurologic disease, autoimmune disease and cardiovascular disease.  While dermatologists agree that the sun is certainly a source of vitamin D, we are also uniquely aware it is also a source of harmful ultraviolet radiation resulting in thousands of skin cancers each year.  The official recommendation of the American Academy of Dermatology (AAD) is that vitamin D should be obtained through dietary sources and supplementation rather than from sunlight (ultraviolet radiation).    MOLES AND MELANOMAS      Moles (nevi) are tan or brown, raised or flat areas of the skin which have an increased number of melanocytes. Melanocytes are the cells in our body which make pigment and account for skin color.    Some moles are present at birth (congenital nevi), while others come up later in life (acquired nevi).  The sun can stimulate the body to make more moles.   Sunburns are not the only thing that triggers more moles.  Chronic sun exposure can do it too.     Malignant melanoma is a type of skin cancer that can be deadly if it spreads throughout the body. The incidence of melanoma in the [place] is growing rapidly.  Melanoma usually grows near the surface of the skin for a period of time, and then begins to grow deeper into the skin. Once it grows deeper into the skin, the risk of spread to other organs greatly increases. Therefore, early detection and removal of a malignant melanoma can result in a complete cure; removal after the tumor has spread may not be effective.    Melanoma can often be suspected by its appearance. The ABCDE's of melanoma are:    Asymmetry: Asymmetry means if you draw a line through the mole, the two halves do not match in color, size, shape, or surface texture. Asymmetry can be a result of rapid enlargement of a mole, the development of a raised area on a previously flat lesion, scaling, ulceration, bleeding or scabbing within the mole.    Border: The border of a melanoma often blends into the normal skin and does not sharply delineate the mole from normal skin.    Color: Different colors within a mole, or the development of dark black, blue, or red areas in a preexisting mole.    Diameter: Size greater than 0.6cm (1/4 of an inch, or the size of a pencil eraser). This is only a guideline, and many normal moles may be this large or even a bit larger.    Evolving: Any change -- in size, shape, color, elevation, or another trait, or any new symptom such as bleeding, itching or crusting    Melanomas should be considered when a mole suddenly appears or changes. Itching, burning, or pain in a pigmented lesion should cause suspicion, but most patients with early melanoma have no skin discomfort whatsoever.  The appearance of a mole remains the most reliable method for identifying a malignant melanoma. Suspicious-looking moles may be removed for  microscopic examination.    Melanoma can occur anywhere on the skin, including areas that are difficult for self-examination. Many melanomas are first noticed by other family members.      Occasionally, melanomas appear as rapidly growing, blue-black, dome-shaped bumps within a previous mole or previous area of normal skin    Dysplastic moles are moles that fit the ABCDE rules of melanoma, but   are not melanomas when examined under the microscope.  They may indicate an increased risk of melanoma in that person. If there is a family history of melanoma, most experts agree that the person is at an increased risk for developing a melanoma.  Experts still do not agree on what dysplastic moles mean in patients without a personal or family history of melanoma.  Dysplastic moles are usually larger than common moles and may have different colors within them with irregular borders. The appearance can be very similar to a melanoma. Biopsies of dysplastic moles may show abnormalities which are different from a regular mole.      You should take steps to decrease you risk of melanoma.   First, avoid the sun and protect your skin when it is exposed to the sun.  People who have lived near the equator, people who have intermittent exposures to large amounts of sun, and people who have had sunburns in childhood or adolescence have an increased risk for melanoma. Sun sense and sun protection are key to preventing melanoma.    Secondly, moles should be looked at regularly.  Melanomas can be diagnosed early by self-examinations at home.  By looking for the ABCDE's of moles once a month, you should notice if any changes have occurred and if so, you should make an appointment earlier than your next check-up in order to be examined.  On the other hand, don't check your moles more than once a month or you might not notice a change.

## 2020-10-03 ENCOUNTER — Other Ambulatory Visit (INDEPENDENT_AMBULATORY_CARE_PROVIDER_SITE_OTHER): Payer: BLUE CROSS/BLUE SHIELD

## 2020-10-03 ENCOUNTER — Telehealth (INDEPENDENT_AMBULATORY_CARE_PROVIDER_SITE_OTHER): Payer: Self-pay | Admitting: Student in an Organized Health Care Education/Training Program

## 2020-10-03 ENCOUNTER — Other Ambulatory Visit: Payer: Self-pay

## 2020-10-03 DIAGNOSIS — Z1159 Encounter for screening for other viral diseases: Secondary | ICD-10-CM

## 2020-10-03 LAB — COVID-19 DETECTION ASSAY ASYM HEALTH EMP: COVID-19 Coronavirus Result: NOT DETECTED

## 2020-10-03 LAB — COVID-19 BINAXNOW ANTIGEN (POCT): COVID-19 Antigen (POCT): NEGATIVE

## 2020-10-03 NOTE — Telephone Encounter (Signed)
Please schedule patient for follow up to discuss thyroid lab results and medication adjustment.

## 2020-10-03 NOTE — Telephone Encounter (Signed)
Called pt and scheduled her this Friday 10/06/20. Matter resolved.

## 2020-10-05 ENCOUNTER — Other Ambulatory Visit: Payer: Self-pay

## 2020-10-06 ENCOUNTER — Encounter (INDEPENDENT_AMBULATORY_CARE_PROVIDER_SITE_OTHER): Payer: Self-pay | Admitting: Student in an Organized Health Care Education/Training Program

## 2020-10-06 ENCOUNTER — Ambulatory Visit (INDEPENDENT_AMBULATORY_CARE_PROVIDER_SITE_OTHER): Payer: BLUE CROSS/BLUE SHIELD | Admitting: Student in an Organized Health Care Education/Training Program

## 2020-10-06 VITALS — BP 109/75 | HR 84 | Temp 97.3°F | Resp 16 | Ht 69.0 in | Wt 195.0 lb

## 2020-10-06 DIAGNOSIS — E039 Hypothyroidism, unspecified: Secondary | ICD-10-CM

## 2020-10-06 MED ORDER — SYNTHROID 100 MCG OR TABS
100.0000 ug | ORAL_TABLET | ORAL | 0 refills | Status: DC
Start: 2020-10-06 — End: 2020-12-25

## 2020-10-06 MED ORDER — SYNTHROID 112 MCG OR TABS
112.0000 ug | ORAL_TABLET | ORAL | 0 refills | Status: DC
Start: 2020-10-06 — End: 2020-12-25

## 2020-10-06 NOTE — Progress Notes (Addendum)
Chief Complaint   Patient presents with    Follow Up     Lab results       [place] is a 55 year old female here for f/u visit.     Today:  #Hypothyroidism:  Has had fluctuating TSH levels since 02/2020.  Synthroid decreased from 125 mcg to 100 mcg daily due to low TSH in 02/2020, then decreased again to 88 mcg. Was stable for a short period of time in 06/2020, then TSH elevated to below.  Asymptomatic.    Lab Results   Component Value Date    TSH 5.61 (H) 09/29/2020           ROS  ROS negative except as documented above in HPI    Physical Exam  OBJECTIVE:: BP 109/75 (BP Location: Left arm, BP Patient Position: Sitting, BP cuff size: Large)    Pulse 84    Temp 97.3 F (36.3 C) (Temporal)    Resp 16    Ht 5\' 9"  (1.753 m)    Wt 88.5 kg (195 lb)    SpO2 100%    BMI 28.80 kg/m   General Appearance: alert, no distress, pleasant affect, cooperative.  Heart:  normal rate and regular rhythm, no murmurs, clicks, or gallops.  Lungs: clear to auscultation. No wheezes, rhonchi, or rales.      Labs and chart reviewed.     Assessment/Plan:     Ariyannah was seen today for follow up.    Diagnoses and all orders for this visit:    Acquired hypothyroidism  -     SYNTHROID 112 MCG tablet; Take 1 tablet (112 mcg) by mouth every other day. Take before breakfast, alternating with 100 mcg every other day.  -     SYNTHROID 100 MCG tablet; Take 1 tablet (100 mcg) by mouth every other day. Take before breakfast, alternating with 112 mcg every other day.  -     TSH, Blood - See Instructions; Future      Alternate Synthroid 112 mcg and 100 mcg every other day.  Recheck TSH 2 months after increasing the dose.    F/u as needed based on TSH results.       Patient Active Problem List    Diagnosis Date Noted    Chronic midline low back pain without sciatica 10/17/2015    Hyperlipidemia, unspecified hyperlipidemia type 09/08/2015    Impingement syndrome, shoulder, right 05/02/2015    Radial styloid tenosynovitis 05/02/2015     Preventative health care 11/28/2014    Allergic rhinitis due to pollen 04/11/2014    Gastroesophageal reflux disease without esophagitis 04/11/2014    Bilateral low back pain without sciatica 09/21/2013    Tension headache 12/26/2011    Gluten intolerance 09/17/2011    Dyspepsia 07/15/2011    Intestinal gas excretion 07/15/2011    Abdominal pain 03/13/2011    Right knee pain 03/13/2011    Routine lab draw 03/13/2011    Hearing loss 12/14/2010    Cerumen impaction 12/14/2010    Pre-operative examination 12/06/2010     Sanyia Dini is a 55 year old female G0P0000 with h/o menorrhagia who presents today for preop visit for hysteroscopy, D&C, possible resection of fibroid, and Novasure ablation.     Work-up:  11/22/10: EMB benign  11/22/10 pap: negative    11/28/10 Pelvic sono:  The uterus shows several intramural fibroids, largest measuring 3.7 x 3.2 cm   containing a subcentimeter cystic degeneration, abutting the endometrium. The  endometrial  stripe is 1.8-mm. Both ovaries are normal measuring 2.6 x 1.3 x   1.7 cm on the right and 2.6 x 2.7 x 2.0 cm on the left. No free fluid is   seen        Skin lesion 12/05/2010    Palpitations 10/02/2010    Low back pain 06/15/2010    Fatigue 06/15/2010    Depression 07/25/2009    Sebaceous cyst 03/23/2009    Insomnia 11/16/2008    Acquired hypothyroidism 09/22/2008    Right foot pain 08/18/2008    Dysmenorrhea 08/18/2008    Grave's disease 01/15/2008    Vitamin D deficiency 01/06/2008    Benign neoplasm of skin, site unspecified 08/27/2007    Cold sensitivity 08/27/2007    Health maintenance examination 02/04/2007     Last pap 04-Sep-2004, has had squamous cells, repeats normal  Mammogram: 2/08, all normal  CHOL 225  HDL65  LDLCALC      143    TRIG 87  (12/15/06)  Cycles: regular q28 days, last two cycles "heavier flow" "more cramping" and 1day longer (5 instead of 4)         Infectious mononucleosis 09/11/2006       Past Medical History:   Diagnosis  Date    Hypothyroidism     h/o hyperthyroidism s/p radioactive iodine    Infectious mononucleosis 2006-09-05    Insomnia     Menorrhagia        Family History   Problem Relation Name Age of Onset    Breast Cancer Mother  38    Other Sister          Uterine fibroids/hysterectomy    Crohn's Disease Sister      Breast Cancer Sister  64    Cancer Sister          Endometrial cancer at 63    Hypertension Father      Hypertension Sister      Cholesterol/Lipid Disorder Sister      Ovarian Cancer Neg Hx          but sister w/ovarian cysts age 74       Social History:  Social History     Socioeconomic History    Marital status: Divorced   Occupational History    Occupation: Office manager   Tobacco Use    Smoking status: Never Smoker    Smokeless tobacco: Never Used    Tobacco comment: 2nd hand smoke   Substance and Sexual Activity    Alcohol use: Yes     Comment: 1 bottle wine /week    Drug use: No    Sexual activity: Not Currently     Partners: Male   Social History Narrative    04-Sep-2017- Divorced and no kids. [employer] and [employer] and communication. Lives on own and social theatre/culture. Yoga and swimming/hiking.        Sep 05, 2018: Sister died of endometrial cancer at 97 yo this year. Very precipitous. Mother still alive in [place]-assisted living.        She is youngest-passing has caused much pain        09-05-19: Hikes occasionally. Yoga on her own. Single.        Allergies:  Allergies   Allergen Reactions    Vicodin [Hydrocodone-Acetaminophen] Hallucinations       Current Outpatient Medications   Medication Sig    acetaminophen (TYLENOL) 325 MG tablet Take 650 mg by mouth every 4 hours as needed for Mild Pain (Pain Score  1-3).    cetirizine (ZYRTEC) 10 MG tablet Take 10 mg by mouth daily.    cyclobenzaprine (FLEXERIL) 5 MG tablet Take 1 tablet (5 mg) by mouth 3 times daily as needed for Muscle Spasms.    desvenlafaxine (PRISTIQ) 50 MG TB24 Take 1 tablet (50 mg) by mouth daily.    desvenlafaxine Succinate ER  (PRISTIQ) 25 MG TB24 Take 1 tablet (25 mg) by mouth daily.    diclofenac (VOLTAREN) 1 % gel Apply 4 g topically 4 times daily.    fluticasone propionate (FLONASE) 50 MCG/ACT nasal spray Spray 1 spray into each nostril 2 times daily.    metroNIDAZOLE (METROGEL) 1 % gel Apply 1 Application topically daily. Use a small amount as directed    SYNTHROID 88 MCG tablet Take 1 tablet (88 mcg) by mouth every morning (before breakfast).    tretinoin (RETIN-A) 0.025 % cream Apply a thin layer at bedtime as directed     No current facility-administered medications for this visit.

## 2020-10-06 NOTE — Patient Instructions (Signed)
Alternate Synthroid 112 mcg and 100 mcg every other day.  Please recheck TSH 2 months after increasing the dose.

## 2020-11-03 ENCOUNTER — Other Ambulatory Visit (INDEPENDENT_AMBULATORY_CARE_PROVIDER_SITE_OTHER): Payer: BLUE CROSS/BLUE SHIELD

## 2020-11-03 ENCOUNTER — Other Ambulatory Visit (INDEPENDENT_AMBULATORY_CARE_PROVIDER_SITE_OTHER): Payer: Self-pay

## 2020-11-03 ENCOUNTER — Ambulatory Visit (HOSPITAL_BASED_OUTPATIENT_CLINIC_OR_DEPARTMENT_OTHER): Payer: BLUE CROSS/BLUE SHIELD | Admitting: Rehabilitative and Restorative Service Providers"

## 2020-11-03 ENCOUNTER — Other Ambulatory Visit: Payer: Self-pay

## 2020-11-03 DIAGNOSIS — Z1159 Encounter for screening for other viral diseases: Secondary | ICD-10-CM

## 2020-11-03 LAB — COVID-19 DETECTION ASSAY SYM HEALTH EMP: COVID-19 Coronavirus Result: NOT DETECTED

## 2020-11-03 LAB — COVID-19 BINAXNOW ANTIGEN (POCT): COVID-19 Antigen (POCT): NEGATIVE

## 2020-11-13 ENCOUNTER — Other Ambulatory Visit: Payer: Self-pay

## 2020-11-13 ENCOUNTER — Encounter: Payer: Self-pay | Admitting: Acute Care

## 2020-11-13 ENCOUNTER — Other Ambulatory Visit (INDEPENDENT_AMBULATORY_CARE_PROVIDER_SITE_OTHER): Payer: BLUE CROSS/BLUE SHIELD

## 2020-11-13 ENCOUNTER — Other Ambulatory Visit (INDEPENDENT_AMBULATORY_CARE_PROVIDER_SITE_OTHER): Payer: Self-pay

## 2020-11-13 DIAGNOSIS — Z1159 Encounter for screening for other viral diseases: Secondary | ICD-10-CM

## 2020-11-13 LAB — COVID-19 DETECTION ASSAY SYM HEALTH EMP: COVID-19 Coronavirus Result: NOT DETECTED

## 2020-11-13 LAB — COVID-19 BINAXNOW ANTIGEN (POCT): COVID-19 Antigen (POCT): NEGATIVE

## 2020-12-22 ENCOUNTER — Other Ambulatory Visit (INDEPENDENT_AMBULATORY_CARE_PROVIDER_SITE_OTHER): Payer: BLUE CROSS/BLUE SHIELD | Attending: Acute Care

## 2020-12-22 ENCOUNTER — Other Ambulatory Visit: Payer: Self-pay

## 2020-12-22 DIAGNOSIS — Z1159 Encounter for screening for other viral diseases: Secondary | ICD-10-CM | POA: Insufficient documentation

## 2020-12-22 LAB — COVID-19 BINAXNOW ANTIGEN (POCT): COVID-19 Antigen (POCT): NEGATIVE

## 2020-12-22 LAB — COVID-19 DETECTION ASSAY SYM HEALTH EMP: COVID-19 Coronavirus Result: NOT DETECTED

## 2020-12-25 ENCOUNTER — Telehealth (INDEPENDENT_AMBULATORY_CARE_PROVIDER_SITE_OTHER): Payer: Self-pay | Admitting: Student in an Organized Health Care Education/Training Program

## 2020-12-25 DIAGNOSIS — F32A Depression, unspecified: Secondary | ICD-10-CM

## 2020-12-25 DIAGNOSIS — E039 Hypothyroidism, unspecified: Secondary | ICD-10-CM

## 2020-12-25 DIAGNOSIS — Z0000 Encounter for general adult medical examination without abnormal findings: Secondary | ICD-10-CM

## 2020-12-25 NOTE — Telephone Encounter (Signed)
Brittany [name], CPhT  (Rx Refill [facility])      Hypothyroid Medication Refill Protocol  Hypothyroid Medication Refill Protocol    Last visit in enc specialty: 10/06/2020     Recent Visits in This Encounter Department     Date Provider Department Visit Type Primary Dx    10/06/2020 [name], MD [facility] Office Visit Acquired hypothyroidism    05/16/2020 Hakim, Cleophas Dunker, MD [place] [facility] Office Visit Chest pain, unspecified type    02/25/2020 Hakim, Cleophas Dunker, MD [facility] Office Visit Encounter for routine adult medical examination    01/25/2020 Rennis Chris, MD, PhD [facility] Office Visit Plantar fasciitis, bilateral         Population Health Visits  Recent [facility] Visits    None       Next f/u appt due:  F/u as needed based on TSH results.  Next appt in enc specialty: Visit date not found      Future Appointments 12/25/2020 - 12/24/2025              Date Visit Type Department Provider     10/05/2021  8:15 AM RETURN GEN [facility] Marguerite Olea, MD    Appointment Notes:     tbc 1 year                         Per OV 10/06/20:  SUBJECTIVE::    Today:  #Hypothyroidism:  Has had fluctuating TSH levels since 02/2020.  Synthroid decreased from 125 mcg to 100 mcg daily due to low TSH in 02/2020, then decreased again to 88 mcg. Was stable for a short period of time in 06/2020, then TSH elevated to below.  Asymptomatic.          Lab Results   Component Value Date    TSH 5.61 (H) 09/29/2020   Assessment/Plan:    Acquired hypothyroidism  -     SYNTHROID 112 MCG tablet; Take 1 tablet (112 mcg) by mouth every other day. Take before breakfast, alternating with 100 mcg every other day.  -     SYNTHROID 100 MCG tablet; Take 1 tablet (100 mcg) by mouth every other day. Take before breakfast, alternating with 112 mcg every other  day.  -     TSH, Blood - See Instructions; Future      Alternate Synthroid 112 mcg and 100 mcg every other day.  Recheck TSH 2 months after increasing the dose.    F/u as needed based on TSH results.  Instructions    Alternate Synthroid 112 mcg and 100 mcg every other day.  Please recheck TSH 2 months after increasing the dose.              **If no recent dose changes, and last TSH (and T4 for Liothyronine only) normal/within date, no recent notes will be reviewed per Pharmacy Dept protocol**        NO OUTSIDE LABS FOUND CHECKED CARE EVERYWHERE & MEDIA  LABS required:  (Q year TSH)  *If recent dose change: check TSH 2 months after starting new  dose.    Lab Results   Component Value Date    TSH 5.61 (H) 09/29/2020    FREET4 1.82 (H) 11/25/2014  Monitoring required:  (Q year BP, HR, Wt)   *If pt is pregnant, DEFER TO MD*    (BP range: 0000000  [id number])  Blood Pressure   10/06/20 109/75   05/16/20 107/59   05/09/20 141/83       (HR range: 55-110)   Pulse Readings from Last 3 Encounters:   10/06/20 84   05/16/20 87   05/09/20 97       Wt Readings from Last 3 Encounters:   10/06/20 88.5 kg (195 lb)   05/16/20 86.2 kg (190 lb)   05/09/20 87.2 kg (192 lb 3.2 oz)         Last Digital Health Monitoring Vitals:        Last MyChart BP Values:        Last Pt Entered MyChart BP Values:           '

## 2020-12-26 MED ORDER — SYNTHROID 112 MCG OR TABS
112.0000 ug | ORAL_TABLET | ORAL | 0 refills | Status: DC
Start: 2020-12-26 — End: 2021-03-26

## 2020-12-26 MED ORDER — SYNTHROID 100 MCG OR TABS
100.0000 ug | ORAL_TABLET | ORAL | 0 refills | Status: DC
Start: 2020-12-26 — End: 2021-03-26

## 2020-12-26 NOTE — Telephone Encounter (Signed)
Lab Reminder:  [facility]     Please remind pt to have labs drawn (non-fasting).     Authorized 90 days supply + 0 RF until labs complete.       Thanks,    [facility]   Refill and Prior Auth Clinical Services  Phone:  [phone]  Ext:  [phone]

## 2021-01-04 ENCOUNTER — Other Ambulatory Visit (INDEPENDENT_AMBULATORY_CARE_PROVIDER_SITE_OTHER): Payer: BLUE CROSS/BLUE SHIELD

## 2021-01-04 DIAGNOSIS — Z1159 Encounter for screening for other viral diseases: Secondary | ICD-10-CM

## 2021-01-04 LAB — COVID-19 DETECTION ASSAY ASYM HEALTH EMP: COVID-19 Coronavirus Result: NOT DETECTED

## 2021-01-11 ENCOUNTER — Other Ambulatory Visit (INDEPENDENT_AMBULATORY_CARE_PROVIDER_SITE_OTHER): Payer: BLUE CROSS/BLUE SHIELD | Attending: Acute Care

## 2021-01-11 DIAGNOSIS — Z1159 Encounter for screening for other viral diseases: Secondary | ICD-10-CM | POA: Insufficient documentation

## 2021-01-11 LAB — COVID-19 BINAXNOW ANTIGEN (POCT): COVID-19 Antigen (POCT): NEGATIVE

## 2021-01-12 LAB — COVID-19 DETECTION ASSAY SYM HEALTH EMP: COVID-19 Coronavirus Result: NOT DETECTED

## 2021-01-31 ENCOUNTER — Other Ambulatory Visit: Payer: Self-pay

## 2021-02-02 ENCOUNTER — Other Ambulatory Visit (INDEPENDENT_AMBULATORY_CARE_PROVIDER_SITE_OTHER): Payer: Self-pay | Admitting: Obstetrics & Gynecology

## 2021-02-02 DIAGNOSIS — N951 Menopausal and female climacteric states: Secondary | ICD-10-CM

## 2021-02-05 ENCOUNTER — Other Ambulatory Visit
Admit: 2021-02-05 | Discharge: 2021-02-05 | Disposition: A | Discharge status: Home / Self Care | Payer: BLUE CROSS/BLUE SHIELD

## 2021-02-05 ENCOUNTER — Other Ambulatory Visit (INDEPENDENT_AMBULATORY_CARE_PROVIDER_SITE_OTHER): Payer: BLUE CROSS/BLUE SHIELD

## 2021-02-05 ENCOUNTER — Other Ambulatory Visit: Payer: Self-pay

## 2021-02-05 DIAGNOSIS — Z1159 Encounter for screening for other viral diseases: Secondary | ICD-10-CM

## 2021-02-05 LAB — COVID-19 DETECTION ASSAY SYM HEALTH EMP: COVID-19 Coronavirus Result: NOT DETECTED

## 2021-02-06 NOTE — Telephone Encounter (Signed)
02/01/2020 is the last time pt was seen. Please review

## 2021-02-07 ENCOUNTER — Ambulatory Visit: Payer: Self-pay

## 2021-02-07 DIAGNOSIS — Z23 Encounter for immunization: Secondary | ICD-10-CM

## 2021-02-07 NOTE — Progress Notes (Signed)
Employee flu clinic.

## 2021-02-08 MED ORDER — DESVENLAFAXINE SUCCINATE ER 25 MG PO TB24
25.0000 mg | ORAL_TABLET | Freq: Every day | ORAL | 0 refills | Status: DC
Start: 2021-02-08 — End: 2021-05-15

## 2021-02-16 ENCOUNTER — Ambulatory Visit (INDEPENDENT_AMBULATORY_CARE_PROVIDER_SITE_OTHER): Payer: BLUE CROSS/BLUE SHIELD

## 2021-02-16 DIAGNOSIS — Z23 Encounter for immunization: Secondary | ICD-10-CM

## 2021-03-01 ENCOUNTER — Encounter (INDEPENDENT_AMBULATORY_CARE_PROVIDER_SITE_OTHER): Payer: Self-pay | Admitting: Student in an Organized Health Care Education/Training Program

## 2021-03-01 DIAGNOSIS — M25561 Pain in right knee: Secondary | ICD-10-CM

## 2021-03-01 DIAGNOSIS — G8929 Other chronic pain: Secondary | ICD-10-CM

## 2021-03-01 NOTE — Telephone Encounter (Addendum)
Routing to Dr. Charlynn Court for review and advise. Pended referral, please review and sign if appropriate or indicate if patient needs appointment.    From: [name]  To: [name], MD  Sent: 03/01/2021  9:00 AM PDT  Subject: Referral to Dr. Johney Frame     Hello Dr. Charlynn Court,    Requesting a referral to Dr. Raymon Mutton for evaluation of right knee for possible surgery. There is at least one MRI and x-rays in my health record. The pain has been ongoing for years and limits my ability to hike and other exercise.     Thank you,  Brittany [name]

## 2021-03-04 NOTE — Telephone Encounter (Signed)
Referral signed.  Please let patient know that Ortho may request updated imaging, in which case an appointment is needed in our clinic to discuss symptoms.  Surgery is typically considered with severe osteoarthritis and once other conservative measures including physical therapy and joint injection are exhausted.

## 2021-03-06 ENCOUNTER — Telehealth (HOSPITAL_BASED_OUTPATIENT_CLINIC_OR_DEPARTMENT_OTHER): Payer: Self-pay

## 2021-03-06 NOTE — Telephone Encounter (Signed)
Received referral for patient to be seen in ortho clinic: sports med     Referring MD LOV notes: Chronic pain of right knee     Reached out to patient, left message to callback [phone] and schedule. When patient returns call, please verify and update registration/insurance and authorization, and schedule per below. Please link referral to appointment.    Schedule with:  NON-OPERATIVE PROVIDER  Appointment timeframe: NEXT AVAILABLE  Appointment location: OKAY TO OFFER FIRST AVAILABLE LOCATION

## 2021-03-23 ENCOUNTER — Other Ambulatory Visit: Payer: BLUE CROSS/BLUE SHIELD | Attending: Student in an Organized Health Care Education/Training Program

## 2021-03-23 DIAGNOSIS — Z0000 Encounter for general adult medical examination without abnormal findings: Secondary | ICD-10-CM

## 2021-03-23 DIAGNOSIS — F32A Depression, unspecified: Secondary | ICD-10-CM | POA: Insufficient documentation

## 2021-03-23 DIAGNOSIS — E039 Hypothyroidism, unspecified: Secondary | ICD-10-CM

## 2021-03-23 LAB — COMPREHENSIVE METABOLIC PANEL, BLOOD
ALT (SGPT): 13 U/L (ref 0–33)
AST (SGOT): 16 U/L (ref 0–32)
Albumin: 4.7 g/dL (ref 3.5–5.2)
Alkaline Phos: 79 U/L (ref 40–130)
Anion Gap: 9 mmol/L (ref 7–15)
BUN: 14 mg/dL (ref 6–20)
Bicarbonate: 27 mmol/L (ref 22–29)
Bilirubin, Tot: 0.98 mg/dL (ref <1.2)
Calcium: 9.7 mg/dL (ref 8.5–10.6)
Chloride: 103 mmol/L (ref 98–107)
Creatinine: 0.76 mg/dL (ref 0.51–0.95)
Glucose: 83 mg/dL (ref 70–99)
Potassium: 4.1 mmol/L (ref 3.5–5.1)
Sodium: 139 mmol/L (ref 136–145)
Total Protein: 6.9 g/dL (ref 6.0–8.0)
eGFR Based on CKD-EPI 2021 Equation: >60 mL/min

## 2021-03-23 LAB — TSH, BLOOD: TSH: 0.7 u[IU]/mL (ref 0.27–4.20)

## 2021-03-23 NOTE — Interdisciplinary (Signed)
Blood drawn from right arm with 21 gauge needle. 1 tubes taken.   Patient identity authenticated by Helen Grace Rasch.

## 2021-03-26 ENCOUNTER — Other Ambulatory Visit (INDEPENDENT_AMBULATORY_CARE_PROVIDER_SITE_OTHER): Payer: Self-pay | Admitting: Student in an Organized Health Care Education/Training Program

## 2021-03-26 DIAGNOSIS — E039 Hypothyroidism, unspecified: Secondary | ICD-10-CM

## 2021-03-26 DIAGNOSIS — M545 Low back pain, unspecified: Secondary | ICD-10-CM

## 2021-03-26 DIAGNOSIS — G8929 Other chronic pain: Secondary | ICD-10-CM

## 2021-03-26 MED ORDER — CYCLOBENZAPRINE HCL 5 MG OR TABS
5.0000 mg | ORAL_TABLET | Freq: Three times a day (TID) | ORAL | 0 refills | Status: DC | PRN
Start: 2021-03-26 — End: 2022-08-02

## 2021-03-26 NOTE — Telephone Encounter (Signed)
Established with:  Last OV with PCP:  Next OV with Dept: Brittany [name]   10/06/2020  Visit date not found     Medication requested:   Requested Prescriptions     Pending Prescriptions Disp Refills   . cyclobenzaprine (FLEXERIL) 5 MG tablet 30 tablet 0     Sig: Take 1 tablet (5 mg) by mouth 3 times daily as needed for Muscle Spasms.       Last Filled Date 08/22/20   Quantity Last Filled  30 tablet   Refill 0   Send to:    [id number] - [place], [place] - [address]  [address]  [place] [address]  Phone: [phone] Fax: [phone] Alternate Fax: [place]  [id number]  [place] [address]  Phone: [phone] Fax: [phone]      Current Medication(s):  Current Outpatient Medications   Medication Sig Dispense Refill   . acetaminophen (TYLENOL) 325 MG tablet Take 650 mg by mouth every 4 hours as needed for Mild Pain (Pain Score 1-3).     . cetirizine (ZYRTEC) 10 MG tablet Take 10 mg by mouth daily.     . cyclobenzaprine (FLEXERIL) 5 MG tablet Take 1 tablet (5 mg) by mouth 3 times daily as needed for Muscle Spasms. 30 tablet 0   . desvenlafaxine (PRISTIQ) 50 MG TB24 Take 1 tablet (50 mg) by mouth daily. 90 tablet 3   . desvenlafaxine Succinate ER (PRISTIQ) 25 MG TB24 Take 1 tablet (25 mg) by mouth daily. 90 tablet 0   . diclofenac (VOLTAREN) 1 % gel Apply 4 g topically 4 times daily. 1 each 3   . fluticasone propionate (FLONASE) 50 MCG/ACT nasal spray Spray 1 spray into each nostril 2 times daily. 1 bottle 5   . metroNIDAZOLE (METROGEL) 1 % gel Apply 1 Application topically daily. Use a small amount as directed 1 each 11   . SYNTHROID 100 MCG tablet Take 1 tablet (100 mcg) by mouth every other day. Take before breakfast, alternating with 112 mcg every other day. 45 tablet 0   . SYNTHROID 112 MCG tablet Take 1 tablet (112 mcg) by mouth every other day. Take before breakfast, alternating with 100 mcg every other day. 45 tablet 0   .  tretinoin (RETIN-A) 0.025 % cream Apply a thin layer at bedtime as directed 1 Tube 3     No current facility-administered medications for this visit.       Patient information: Allergies   Allergen Reactions   . Vicodin [Hydrocodone-Acetaminophen] Hallucinations      Health Maintenance Due   Topic Date Due   . Shingles Vaccine (2 of 2) 01/07/2020   . PHQ9 Depression Monitoring doc flowsheet  02/06/2021      Last BP:  Blood Pressure   10/06/20 109/75   05/16/20 107/59   05/09/20 141/83      Last labs:  Results for orders placed or performed in visit on 03/23/21   TSH, Blood - See Instructions   Result Value Ref Range    TSH 0.70 0.27 - 4.20 uIU/mL   Comprehensive Metabolic Panel   Result Value Ref Range    Glucose 83 70 - 99 mg/dL    BUN 14 6 - 20 mg/dL    Creatinine 0.76 0.51 - 0.95 mg/dL    eGFR Based on CKD-EPI 2021 Equation >60 mL/min    Sodium 139 136 - 145  mmol/L    Potassium 4.1 3.5 - 5.1 mmol/L    Chloride 103 98 - 107 mmol/L    Bicarbonate 27 22 - 29 mmol/L    Anion Gap 9 7 - 15 mmol/L    Calcium 9.7 8.5 - 10.6 mg/dL    Total Protein 6.9 6.0 - 8.0 g/dL    Albumin 4.7 3.5 - 5.2 g/dL    Bilirubin, Tot 0.98 <1.2 mg/dL    AST (SGOT) 16 0 - 32 U/L    ALT (SGPT) 13 0 - 33 U/L    Alkaline Phos 79 40 - 130 U/L     Lab Results   Component Value Date    CHOL 258 (H) 02/25/2020    HDL 80 02/25/2020    LDLCALC 164 (H) 02/25/2020    TRIG 69 02/25/2020    TSH 0.70 03/23/2021    A1C 5.4 02/25/2020

## 2021-03-26 NOTE — Telephone Encounter (Signed)
flexeril is not currently included in the Pharmacy Refill Clinic protocols. Re-routing to the responsible staff for processing.  Thank you

## 2021-03-26 NOTE — Telephone Encounter (Signed)
*  The requested med passed all protocol parameters in the associated med info guide - Protocol details reviewed by pharmacist.       Hypothyroid Medication Refill Protocol    Last visit in enc specialty: 10/06/2020     Recent Visits in This Encounter Department     Date Provider Department Visit Type Primary Dx    10/06/2020 [name], MD [facility] Office Visit Acquired hypothyroidism    05/16/2020 Hakim, Cleophas Dunker, MD [facility] Office Visit Chest pain, unspecified type    02/25/2020 Hakim, Cleophas Dunker, MD [facility] Office Visit Encounter for routine adult medical examination    01/25/2020 Rennis Chris, MD, PhD [facility] Office Visit Plantar fasciitis, bilateral         Population Health Visits  Recent [facility] Visits    None       Next appt in enc specialty: Visit date not found      Future Appointments 03/26/2021 - 03/25/2026      Date Visit Type Department Provider     04/02/2021  4:00 PM SCREENING MAMMOGRAM BILATERAL K[facility] MAMMOGRAPHY K[facility] MAMMO 3    Appointment Notes:     CO priors [place]<BR>Annual screening; family history             10/05/2021  8:15 AM RETURN GEN [facility] Marguerite Olea, MD    Appointment Notes:     tbc 1 year                       **If no recent dose changes, and last TSH (and T4 for Liothyronine only) normal/within date, no recent notes will be reviewed per Pharmacy Dept protocol**      LABS required:  (Q year TSH)  *If recent dose change: check TSH 2 months after starting new  dose.    Lab Results   Component Value Date    TSH 0.70 03/23/2021    FREET4 1.82 (H) 11/25/2014         Monitoring required:  (Q year BP, HR, Wt)   *If pt is pregnant, DEFER TO MD*    (BP range: GOTLXBWI=20-355  HRCBULAGT=36-46)  Blood Pressure   10/06/20 109/75   05/16/20 107/59   05/09/20 141/83       (HR range: 55-110)    Pulse Readings from Last 3 Encounters:   10/06/20 84   05/16/20 87   05/09/20 97       Wt Readings from Last 3 Encounters:   10/06/20 88.5 kg (195 lb)   05/16/20 86.2 kg (190 lb)   05/09/20 87.2 kg (192 lb 3.2 oz)         Last Digital Health Monitoring Vitals:        Last MyChart BP Values:        Last Pt Entered MyChart BP Values:

## 2021-03-27 MED ORDER — SYNTHROID 112 MCG OR TABS
112.0000 ug | ORAL_TABLET | ORAL | 2 refills | Status: DC
Start: 2021-03-27 — End: 2021-12-22

## 2021-03-27 MED ORDER — SYNTHROID 100 MCG OR TABS
100.0000 ug | ORAL_TABLET | ORAL | 2 refills | Status: DC
Start: 2021-03-27 — End: 2021-12-22

## 2021-03-28 ENCOUNTER — Other Ambulatory Visit (INDEPENDENT_AMBULATORY_CARE_PROVIDER_SITE_OTHER): Payer: Self-pay | Admitting: Student in an Organized Health Care Education/Training Program

## 2021-03-28 DIAGNOSIS — M722 Plantar fascial fibromatosis: Secondary | ICD-10-CM

## 2021-03-28 NOTE — Telephone Encounter (Signed)
[facility]     diclofenac gel is not currently included in the Pharmacy Refill Clinic protocols. Re-routing to the responsible staff for processing.  Thank you.

## 2021-03-29 ENCOUNTER — Other Ambulatory Visit: Payer: Self-pay

## 2021-03-29 MED ORDER — DICLOFENAC SODIUM 1 % EX GEL
4.0000 g | Freq: Four times a day (QID) | CUTANEOUS | 3 refills | Status: DC | PRN
Start: 2021-03-29 — End: 2022-06-17

## 2021-03-29 NOTE — Telephone Encounter (Signed)
Medication requested:   Requested Prescriptions     Pending Prescriptions Disp Refills   . diclofenac (VOLTAREN) 1 % gel 1 each 3     Sig: Apply 4 g topically 4 times daily.       Date of last refill: 01/25/20    Last OV (provider):      10/06/2020  Last OV (department): 10/06/2020    Next OV (provider):      Visit date not found  Next OV (department): Visit date not found

## 2021-04-02 ENCOUNTER — Other Ambulatory Visit: Payer: Self-pay

## 2021-04-02 ENCOUNTER — Ambulatory Visit
Admission: RE | Admit: 2021-04-02 | Discharge: 2021-04-02 | Disposition: A | Discharge status: Home / Self Care | Payer: BLUE CROSS/BLUE SHIELD | Attending: Student in an Organized Health Care Education/Training Program | Admitting: Student in an Organized Health Care Education/Training Program

## 2021-04-02 DIAGNOSIS — Z1231 Encounter for screening mammogram for malignant neoplasm of breast: Secondary | ICD-10-CM

## 2021-04-02 DIAGNOSIS — Z1239 Encounter for other screening for malignant neoplasm of breast: Secondary | ICD-10-CM | POA: Insufficient documentation

## 2021-04-25 ENCOUNTER — Other Ambulatory Visit (INDEPENDENT_AMBULATORY_CARE_PROVIDER_SITE_OTHER): Payer: BLUE CROSS/BLUE SHIELD

## 2021-04-25 DIAGNOSIS — Z1159 Encounter for screening for other viral diseases: Secondary | ICD-10-CM

## 2021-04-25 LAB — COVID-19 DETECTION ASSAY ASYM HEALTH EMP: COVID-19 Coronavirus Result: NOT DETECTED

## 2021-04-27 ENCOUNTER — Emergency Department (INDEPENDENT_AMBULATORY_CARE_PROVIDER_SITE_OTHER): Payer: BLUE CROSS/BLUE SHIELD | Admitting: Emergency Medicine

## 2021-04-27 ENCOUNTER — Encounter (INDEPENDENT_AMBULATORY_CARE_PROVIDER_SITE_OTHER): Payer: Self-pay

## 2021-04-27 ENCOUNTER — Other Ambulatory Visit (INDEPENDENT_AMBULATORY_CARE_PROVIDER_SITE_OTHER): Payer: BLUE CROSS/BLUE SHIELD

## 2021-04-27 ENCOUNTER — Other Ambulatory Visit: Payer: Self-pay

## 2021-04-27 VITALS — BP 127/78 | HR 77 | Temp 98.0°F | Resp 16

## 2021-04-27 DIAGNOSIS — J069 Acute upper respiratory infection, unspecified: Secondary | ICD-10-CM

## 2021-04-27 LAB — RAPID INFLUENZA NAAT (POCT)
Influenza A, Rapid: NOT DETECTED
Influenza B, Rapid: NOT DETECTED

## 2021-04-27 LAB — COVID-19 RAPID NAAT (POCT): COVID-19 Rapid Assay (POCT): NOT DETECTED

## 2021-04-27 NOTE — Progress Notes (Signed)
55 year old female works at [employer] non clinical presents with recent URI symptoms has had runny nose cough had some nausea and some vague abdominal discomfort without vomiting nor diarrhea   No dysuria frequency urgency  No sore throat  No vaginal bleeding or discharge no current abdominal discomfort  Was exposed to a co-worker with COVID patient   Patient did COVID PCR 04/25/2021 that was negative desires repeat testing and influenza testing  No recent travel    Past medical history:  Hypothyroidism  Past surgical history:  Cholecystectomy, tonsillectomy, wisdom teeth  Allergies: Vicodin  Medications:  desvenlafaxine, levothyroxine    Alert well-appearing afebrile pulse oximeter 97% on room air   Anicteric pink conjunctiva  Oropharynx clear  No JVD   Clear to auscultation bilaterally good air movement  Regular rate and rhythm  Abdomen is soft nondistended well-healed laparoscopy scar from cholecystectomy nontender no hepatosplenomegaly   No CVA tenderness bilaterally   No rash  Oriented    Rapid COVID and influenza:  Negative      Impression/plan  URI  -reassuring history of physical examination

## 2021-05-11 ENCOUNTER — Other Ambulatory Visit (INDEPENDENT_AMBULATORY_CARE_PROVIDER_SITE_OTHER): Payer: Self-pay | Admitting: Obstetrics & Gynecology

## 2021-05-11 DIAGNOSIS — N951 Menopausal and female climacteric states: Secondary | ICD-10-CM

## 2021-05-15 ENCOUNTER — Other Ambulatory Visit (INDEPENDENT_AMBULATORY_CARE_PROVIDER_SITE_OTHER): Payer: Self-pay | Admitting: Obstetrics & Gynecology

## 2021-05-15 DIAGNOSIS — N951 Menopausal and female climacteric states: Secondary | ICD-10-CM

## 2021-05-15 NOTE — Telephone Encounter (Signed)
Patient requesting rx renewal.    Last seen: 02/01/2020   Next appt: none     Please review.

## 2021-05-15 NOTE — Telephone Encounter (Signed)
02/01/2020 is the last time pt was seen. No future appointment at this time. Please review    Thank you, Hart [place]

## 2021-05-16 MED ORDER — DESVENLAFAXINE SUCCINATE ER 25 MG PO TB24
25.0000 mg | ORAL_TABLET | Freq: Every day | ORAL | 0 refills | Status: DC
Start: 2021-05-16 — End: 2021-08-12

## 2021-05-16 MED ORDER — DESVENLAFAXINE SUCCINATE 50 MG OR TB24
50.0000 mg | ORAL_TABLET | Freq: Every day | ORAL | 3 refills | Status: DC
Start: 2021-05-16 — End: 2021-08-12

## 2021-05-23 ENCOUNTER — Encounter (INDEPENDENT_AMBULATORY_CARE_PROVIDER_SITE_OTHER): Payer: Self-pay | Admitting: Hospital

## 2021-06-01 ENCOUNTER — Other Ambulatory Visit: Payer: Self-pay

## 2021-06-01 ENCOUNTER — Other Ambulatory Visit: Payer: BLUE CROSS/BLUE SHIELD

## 2021-06-01 DIAGNOSIS — Z1159 Encounter for screening for other viral diseases: Secondary | ICD-10-CM

## 2021-06-01 LAB — COVID-19 CORONAVIRUS DETECTION ASSAY AT ~~LOC~~ LAB: COVID-19 Coronavirus Result: NOT DETECTED

## 2021-06-01 LAB — COVID-19 RAPID NAAT (POCT): COVID-19 Rapid Assay (POCT): NOT DETECTED

## 2021-06-01 NOTE — Interdisciplinary (Signed)
2 Patient Identifiers verified: Brittany [name] 1966/01/09  POCT performed  RT PCR  Specimen collected by RN  June 01, 2021, 11:43 AM   Patient tolerated well

## 2021-06-28 ENCOUNTER — Other Ambulatory Visit: Payer: Self-pay

## 2021-06-29 ENCOUNTER — Ambulatory Visit (INDEPENDENT_AMBULATORY_CARE_PROVIDER_SITE_OTHER): Payer: BLUE CROSS/BLUE SHIELD | Admitting: Student in an Organized Health Care Education/Training Program

## 2021-06-29 VITALS — BP 106/70 | HR 92 | Temp 98.8°F | Ht 69.0 in | Wt 194.0 lb

## 2021-06-29 DIAGNOSIS — Z1339 Encounter for screening examination for other mental health and behavioral disorders: Secondary | ICD-10-CM

## 2021-06-29 DIAGNOSIS — J309 Allergic rhinitis, unspecified: Secondary | ICD-10-CM

## 2021-06-29 DIAGNOSIS — Z723 Lack of physical exercise: Secondary | ICD-10-CM

## 2021-06-29 DIAGNOSIS — E039 Hypothyroidism, unspecified: Secondary | ICD-10-CM

## 2021-06-29 DIAGNOSIS — Z1389 Encounter for screening for other disorder: Secondary | ICD-10-CM

## 2021-06-29 DIAGNOSIS — R5383 Other fatigue: Secondary | ICD-10-CM

## 2021-06-29 DIAGNOSIS — E785 Hyperlipidemia, unspecified: Secondary | ICD-10-CM

## 2021-06-29 MED ORDER — AZELASTINE HCL 137 MCG/SPRAY NA SOLN
1.0000 | Freq: Two times a day (BID) | NASAL | 1 refills | Status: DC
Start: 2021-06-29 — End: 2022-03-04

## 2021-06-29 NOTE — Patient Instructions (Addendum)
Please get fasting cholesterol panel and follow up within 2 months if levels are still high.  I also ordered a routine screening test for blood counts.  Add Astelin spray in addition to nasal saline spray and Flonase. Continue Zyrtec. Recommend re-using HEPA filter.

## 2021-06-29 NOTE — Progress Notes (Signed)
Chief Complaint   Patient presents with   . Fatigue     Blood tests, frequent colds       SUBJECTIVE::Brittany [name] is 56 year old and here for follow-up.    . Mammo normal 03/2021.  Marland Kitchen TSH normal 03/2021 on alternating Synthroid 112 mcg and 100 mcg every other day.  . Reports recurrent "colds" in the winter season yearly. Rhinorrhea, throat scratching, some fatigue. Dry cough intermittently, no wheezing. No teary eyes. Denies fevers or SOB. Drinks hot tea when sxs occur, and she rests. Not sure if seasonal allergies. Takes Zyrtec nightly and uses flonase. Has tried Claritin and Allegra in the past but did not like how they made her feel.  . [place] winds worsen sxs. Has not gotten a chance to replace HEPA filter. Uses hypoallergenic mattress cover and pillow cover. Has cat.  Marland Kitchen HLD:  Diet: Gluten intolerant but not celiac. Olive oil. Does not eat pasta but eats potatoes in various forms. Brown rice. Gluten free bread. Organic dark chocolate for sweets.    The 10-year ASCVD risk score (Arnett DK, et al., 2019) is: 1.3%    Values used to calculate the score:      Age: 89 years      Sex: Female      Is Non-Hispanic African American: No      Diabetic: No      Tobacco smoker: No      Systolic Blood Pressure: 762 mmHg      Is BP treated: No      HDL Cholesterol: 80 mg/dL      Total Cholesterol: 258 mg/dL      Assessment and Plan:  I personally spent 32 total minutes in face-to-face and non-face-to-face activities related to the patient's visit today, excluding interpretation services and any separately reportable services/procedures    Angelyse was seen today for fatigue.    Diagnoses and all orders for this visit:    Allergic rhinitis, unspecified seasonality, unspecified trigger  -     azelastine (ASTELIN) 0.1 % nasal spray; Spray 1 spray into each nostril 2 times daily. Use in each nostril as directed    Fatigue, unspecified type  -     CBC w/ Diff Lavender; Future    Acquired hypothyroidism    Hyperlipidemia, unspecified  hyperlipidemia type  -     Lipid Panel Green Plasma Separator Tube; Future    Inadequate exercise - not at goal    Screened negative for alcohol use    Screened negative for drug use      Known allergic rhinitis, likely exacerbated by the environment and [employer]. Neg RAST panel in the past. Add Astelin spray in addition to nasal saline spray and Flonase. Continue Zyrtec. Recommend re-using HEPA filter.  Fasting cholesterol panel and follow up within 2 months if LDL levels are still high. Has had persistently elevated LDL >160 for the past few years. Diet is overall healthy. Discussed starting statin in that case and pt would like to think about this. Sister age 43 on statin.  PAVS Total Activity: 90 minutes/week minutes/week  I have counseled [name] on improving her level of exercise in the following way:     Advised patient to engage in physical activity as recommended in the patient education handout.     2nd shingrix is planning to get.    OBJECTIVE:: BP 106/70 (BP Location: Left arm, BP Patient Position: Sitting, BP cuff size: Large)   Pulse 92  Temp 98.8 F (37.1 C) (Temporal)   Ht _0  (1.753 m)   Wt 88 kg (194 lb)   SpO2 98%   BMI 28.65 kg/m   Physical Exam  General Appearance: alert, no distress, pleasant   HEENT: PERRL, EOMI, no conjunctival injection; nasal passages with clear-white nasal discharge, no turbinate swelling; OP clear, MMM  Heart:  normal rate and regular rhythm, no murmurs, clicks, or gallops.  Lungs: clear to auscultation. No w/r/c      Labs and chart reviewed.    Lab Results   Component Value Date    WBC 5.5 05/09/2020    RBC 3.81 (L) 05/09/2020    HGB 12.3 05/09/2020    HCT 35.7 05/09/2020    MCV 93.7 05/09/2020    MCHC 34.5 05/09/2020    RDW 11.7 (L) 05/09/2020    PLT 194 05/09/2020    MPV 11.3 05/09/2020       Lab Results   Component Value Date    NA 139 03/23/2021    K 4.1 03/23/2021    CL 103 03/23/2021    BICARB 27 03/23/2021    BUN 14 03/23/2021    CREAT 0.76  03/23/2021    GLU 83 03/23/2021     9.7 03/23/2021       Lab Results   Component Value Date    AST 16 03/23/2021    ALT 13 03/23/2021    ALK 79 03/23/2021    TP 6.9 03/23/2021    ALB 4.7 03/23/2021    TBILI 0.98 03/23/2021    DBILI 0.2 11/25/2014       Lab Results   Component Value Date    A1C 5.4 02/25/2020    A1C 5.4 02/19/2019     Lab Results   Component Value Date    CHOL 258 (H) 02/25/2020    HDL 80 02/25/2020    LDLCALC 164 (H) 02/25/2020    TRIG 69 02/25/2020     Lab Results   Component Value Date    TSH 0.70 03/23/2021       Past medical history and family history reviewed and updated today as below.  Allergies and medications reviewed and updated as below.    Past Medical History:   Diagnosis Date   . Hypothyroidism     h/o hyperthyroidism s/p radioactive iodine   . Infectious mononucleosis 08/2006   . Insomnia    . Menorrhagia        Social History:  Social History     Socioeconomic History   . Marital status: Divorced   Occupational History   . Occupation: Office manager   Tobacco Use   . Smoking status: Never   . Smokeless tobacco: Never   . Tobacco comments:     2nd hand smoke   Substance and Sexual Activity   . Alcohol use: Yes     Comment: 1 bottle wine /week   . Drug use: No   . Sexual activity: Not Currently     Partners: Male   Social History Narrative    07/22/2017- Divorced and no kids. [place] and marketing and communication. Lives on own and social theatre/culture. Yoga and swimming/hiking.        22-Jul-2018: Sister died of endometrial cancer at 23 yo this year. Very precipitous. Mother still alive in [place]-assisted living.        She is youngest-passing has caused much pain        2019-07-23: Hikes occasionally. Yoga on her own.  Single.        Allergies:  Allergies   Allergen Reactions   . Vicodin [Hydrocodone-Acetaminophen] Hallucinations       Current Outpatient Medications   Medication Sig   . acetaminophen (TYLENOL) 325 MG tablet Take 2 tablets (650 mg) by mouth every 4 hours as needed for Mild Pain (Pain  Score 1-3).   . cetirizine (ZYRTEC) 10 MG tablet Take 1 tablet (10 mg) by mouth daily.   . cyclobenzaprine (FLEXERIL) 5 MG tablet Take 1 tablet (5 mg) by mouth 3 times daily as needed for Muscle Spasms.   Marland Kitchen desvenlafaxine (PRISTIQ) 50 MG TB24 Take 1 tablet (50 mg) by mouth daily.   Marland Kitchen desvenlafaxine Succinate ER (PRISTIQ) 25 MG TB24 Take 1 tablet (25 mg) by mouth daily.   . diclofenac (VOLTAREN) 1 % gel Apply 4 g topically 4 times daily as needed (pain).   . fluticasone propionate (FLONASE) 50 MCG/ACT nasal spray Spray 1 spray into each nostril 2 times daily.   . metroNIDAZOLE (METROGEL) 1 % gel Apply 1 Application topically daily. Use a small amount as directed   . SYNTHROID 100 MCG tablet Take 1 tablet (100 mcg) by mouth every other day. Take before breakfast, alternating with 112 mcg every other day.   Marland Kitchen SYNTHROID 112 MCG tablet Take 1 tablet (112 mcg) by mouth every other day. Take before breakfast, alternating with 100 mcg every other day.   . tretinoin (RETIN-A) 0.025 % cream Apply a thin layer at bedtime as directed     No current facility-administered medications for this visit.         Barriers to learning assessed: None.  Patient verbalizes understanding and is agreeable to above plan.

## 2021-07-05 ENCOUNTER — Other Ambulatory Visit: Payer: BLUE CROSS/BLUE SHIELD | Attending: Student in an Organized Health Care Education/Training Program

## 2021-07-05 ENCOUNTER — Encounter (INDEPENDENT_AMBULATORY_CARE_PROVIDER_SITE_OTHER): Payer: Self-pay | Admitting: Student in an Organized Health Care Education/Training Program

## 2021-07-05 DIAGNOSIS — E785 Hyperlipidemia, unspecified: Secondary | ICD-10-CM | POA: Insufficient documentation

## 2021-07-05 DIAGNOSIS — R5383 Other fatigue: Secondary | ICD-10-CM | POA: Insufficient documentation

## 2021-07-05 LAB — CBC WITH DIFF, BLOOD
ANC-Automated: 1.7 10*3/uL (ref 1.6–7.0)
Abs Basophils: 0 10*3/uL (ref <0.2)
Abs Eosinophils: 0 10*3/uL (ref 0.0–0.5)
Abs Lymphs: 2.7 10*3/uL (ref 0.8–3.1)
Abs Monos: 0.3 10*3/uL (ref 0.2–0.8)
Basophils: 0 %
Eosinophils: 0 %
Hct: 39.8 % (ref 34.0–45.0)
Hgb: 13.2 gm/dL (ref 11.2–15.7)
Lymphocytes: 58 %
MCH: 31.5 pg (ref 26.0–32.0)
MCHC: 33.2 g/dL (ref 32.0–36.0)
MCV: 95 um3 (ref 79.0–95.0)
MPV: 11.5 fL (ref 9.4–12.4)
Monocytes: 6 %
Plt Count: 199 10*3/uL (ref 140–370)
RBC: 4.19 10*6/uL (ref 3.90–5.20)
RDW: 11.7 % — ABNORMAL LOW (ref 12.0–14.0)
Segs: 36 %
WBC: 4.6 10*3/uL (ref 4.0–10.0)

## 2021-07-05 LAB — LIPID(CHOL FRACT) PANEL, BLOOD
Cholesterol: 265 mg/dL — ABNORMAL HIGH (ref <200)
HDL-Cholesterol: 65 mg/dL
LDL-Chol (Calc): 188 mg/dL — ABNORMAL HIGH (ref <160)
Non-HDL Cholesterol: 200 mg/dL
Triglycerides: 60 mg/dL (ref 10–170)

## 2021-07-05 NOTE — Interdisciplinary (Signed)
Blood drawn from right arm with 21 gauge needle. 2 tubes taken.   Patient identity authenticated by Megan M Smyth.

## 2021-07-06 MED ORDER — ROSUVASTATIN CALCIUM 10 MG OR TABS
10.0000 mg | ORAL_TABLET | Freq: Every day | ORAL | Status: DC
Start: ? — End: 2021-07-19

## 2021-07-06 MED ORDER — COQ10 200 MG PO CAPS
1.0000 | ORAL_CAPSULE | Freq: Every day | ORAL | Status: DC
Start: ? — End: 2021-07-19

## 2021-07-06 NOTE — Telephone Encounter (Signed)
From: [name]  To: [name], MD  Sent: 07/05/2021 7:22 PM PST  Subject: Statin    My sister takes Crestor - she started on 10mg ; she   takes CoQ10 with it, at least 200 mg.

## 2021-07-18 ENCOUNTER — Encounter (INDEPENDENT_AMBULATORY_CARE_PROVIDER_SITE_OTHER): Payer: BLUE CROSS/BLUE SHIELD | Admitting: Student in an Organized Health Care Education/Training Program

## 2021-07-18 DIAGNOSIS — E785 Hyperlipidemia, unspecified: Secondary | ICD-10-CM

## 2021-07-19 MED ORDER — ROSUVASTATIN CALCIUM 10 MG OR TABS
10.0000 mg | ORAL_TABLET | Freq: Every day | ORAL | 3 refills | Status: DC
Start: 2021-07-19 — End: 2022-07-23

## 2021-07-19 NOTE — Telephone Encounter (Signed)
Routing to Dr. Charlynn Court for review and advise.    Pended crestor 10 mg, please sign if appropriate.

## 2021-07-19 NOTE — Telephone Encounter (Signed)
This patient gave consent for this Medical Advice message and is aware that it may result in a bill to their insurance, as well as the possibility of receiving a bill for a co-pay and/or deductible. They are an established patient but are not seeking information exclusively about a problem treated during an in person or video visit in the last seven days. I do not recommend an in-person or video visit within seven days of my reply.     See the MyChart message from patient and my reply for my assessment and plan.  MyChart encounter date: 07/18/21    I spent a total of  7 minutes reviewing the patient's prior medical records and current request for medical advice, prescribing medications, or ordering tests (if applicable), replying to the patient, and documenting the encounter.

## 2021-08-12 ENCOUNTER — Other Ambulatory Visit (INDEPENDENT_AMBULATORY_CARE_PROVIDER_SITE_OTHER): Payer: Self-pay | Admitting: Obstetrics & Gynecology

## 2021-08-12 DIAGNOSIS — N951 Menopausal and female climacteric states: Secondary | ICD-10-CM

## 2021-08-13 NOTE — Telephone Encounter (Signed)
Pt's last visit was on 02/01/2020 and no future appointment at this time. Pt is requesting a refill for Pristiq 50 mg tablet and Pristiq 25 mg tablet. Please review    Thank you kindly, Hart Rochester

## 2021-08-15 MED ORDER — DESVENLAFAXINE SUCCINATE ER 25 MG PO TB24
25.0000 mg | ORAL_TABLET | Freq: Every day | ORAL | 0 refills | Status: DC
Start: 2021-08-15 — End: 2021-11-14

## 2021-08-15 MED ORDER — DESVENLAFAXINE SUCCINATE 50 MG OR TB24
50.0000 mg | ORAL_TABLET | Freq: Every day | ORAL | 3 refills | Status: DC
Start: 2021-08-15 — End: 2022-01-17

## 2021-08-21 NOTE — Telephone Encounter (Signed)
Mediation sent in 08/15/2021

## 2021-09-16 ENCOUNTER — Other Ambulatory Visit (INDEPENDENT_AMBULATORY_CARE_PROVIDER_SITE_OTHER): Payer: Self-pay | Admitting: Student in an Organized Health Care Education/Training Program

## 2021-09-16 DIAGNOSIS — E039 Hypothyroidism, unspecified: Secondary | ICD-10-CM

## 2021-09-17 NOTE — Telephone Encounter (Signed)
New Rx sent on 11/08/222 to [facility] for #45 w/2 refill(s) (valid for 9 months).        New Rx sent on 03/27/2021 to [facility] for #45 w/2 refill(s) (valid for 9 months).

## 2021-09-21 ENCOUNTER — Encounter (INDEPENDENT_AMBULATORY_CARE_PROVIDER_SITE_OTHER): Payer: Self-pay | Admitting: Hospital

## 2021-09-24 ENCOUNTER — Encounter (INDEPENDENT_AMBULATORY_CARE_PROVIDER_SITE_OTHER): Payer: Self-pay | Admitting: Student in an Organized Health Care Education/Training Program

## 2021-09-24 NOTE — Telephone Encounter (Addendum)
Immunization record updated using Query Imm Registry.      From: [name]  To: [name], MD  Sent: 09/24/2021 12:10 AM PDT  Subject: Immunization update for my medical record    Hello,    Two updates to my immunization record:    I received the second dose of Shingrix vaccine on April 21 at [facility] on [address].    I received the first dose of the Hepatitis B vaccine on May 7 at [facility] on [address].    Suggest making it possible for patient to update their immunizations in MyChart. Seems that feature currently only available for Covid-19.    Hope I'm not charged a co-pay for this message.  Thank you.

## 2021-10-05 ENCOUNTER — Ambulatory Visit (INDEPENDENT_AMBULATORY_CARE_PROVIDER_SITE_OTHER): Payer: BLUE CROSS/BLUE SHIELD | Admitting: Dermatology

## 2021-11-09 ENCOUNTER — Ambulatory Visit (INDEPENDENT_AMBULATORY_CARE_PROVIDER_SITE_OTHER): Payer: BLUE CROSS/BLUE SHIELD | Admitting: Nurse Practitioner

## 2021-11-09 DIAGNOSIS — M898X0 Other specified disorders of bone, multiple sites: Secondary | ICD-10-CM

## 2021-11-09 DIAGNOSIS — H61811 Exostosis of right external canal: Secondary | ICD-10-CM

## 2021-11-09 DIAGNOSIS — H9041 Sensorineural hearing loss, unilateral, right ear, with unrestricted hearing on the contralateral side: Secondary | ICD-10-CM

## 2021-11-09 DIAGNOSIS — H6123 Impacted cerumen, bilateral: Secondary | ICD-10-CM

## 2021-11-09 NOTE — Progress Notes (Signed)
SUBJECTIVE:   Brittany [name] is a 56 year old female. She is here today for ear cleaning. Patient denies  pain and drainage. In the past the patient has not required ear cleaning. Patient's surgical history on their ears is negative. Family history for ear disorders: negative  [place] the [place] much  Thinking about investigating hearing aid for right ear  Last audiogram 2013 - worked up by Dr. Alfonse Spruce for asymmetric hearing loss       OBJECTIVE:  Physical exam revealed bilateral cerumen impaction which was cleaned under otomicroscope. Left ear intact; right ear intact.  Exostoses right 40%    Instrument use:  loop    ASSESSMENT:  Encounter Diagnoses   Name Primary?   . Excessive cerumen in both ear canals Yes       PLAN:   Follow up: PRN  New audiogram and consider CROS aids     Kai Levins, NP

## 2021-11-12 ENCOUNTER — Encounter (INDEPENDENT_AMBULATORY_CARE_PROVIDER_SITE_OTHER): Payer: Self-pay | Admitting: Student in an Organized Health Care Education/Training Program

## 2021-11-12 DIAGNOSIS — M654 Radial styloid tenosynovitis [de Quervain]: Secondary | ICD-10-CM

## 2021-11-12 NOTE — Telephone Encounter (Addendum)
Routing to Dr. Charlynn Court for review and advise.  Pended referral, please review and sign if appropriate or indicate if patient needs to be reevaluated.    From: Brittany [name]  To: [name], MD  Sent: 11/12/2021  1:12 PM PDT  Subject: Update referral to Occupational Therapy    Please renew the referral to [name] in Occupational Therapy. 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 Than you.

## 2021-11-13 ENCOUNTER — Encounter (HOSPITAL_COMMUNITY): Payer: Self-pay

## 2021-11-14 ENCOUNTER — Other Ambulatory Visit (INDEPENDENT_AMBULATORY_CARE_PROVIDER_SITE_OTHER): Payer: Self-pay | Admitting: Obstetrics & Gynecology

## 2021-11-14 DIAGNOSIS — N951 Menopausal and female climacteric states: Secondary | ICD-10-CM

## 2021-11-15 NOTE — Telephone Encounter (Signed)
02/01/2020 was the last time pt was seen and does have an appointment on 01/15/2022. Pt is requesting a refill for Pristiq. Please review    Thank you kindly, Hart Rochester

## 2021-11-16 ENCOUNTER — Other Ambulatory Visit (INDEPENDENT_AMBULATORY_CARE_PROVIDER_SITE_OTHER): Payer: Self-pay | Admitting: Obstetrics & Gynecology

## 2021-11-16 DIAGNOSIS — N951 Menopausal and female climacteric states: Secondary | ICD-10-CM

## 2021-11-16 MED ORDER — DESVENLAFAXINE SUCCINATE ER 25 MG PO TB24
25.0000 mg | ORAL_TABLET | Freq: Every day | ORAL | 0 refills | Status: DC
Start: 2021-11-16 — End: 2022-01-17

## 2021-11-21 NOTE — Telephone Encounter (Signed)
Spoke with pharmacist at [facility] regarding patient medication requested on 6.30.23. Prescription was picked up by patient on 7.3.2023. Patient has 3 months refill.

## 2021-11-28 ENCOUNTER — Ambulatory Visit (INDEPENDENT_AMBULATORY_CARE_PROVIDER_SITE_OTHER): Payer: BLUE CROSS/BLUE SHIELD

## 2021-11-28 ENCOUNTER — Encounter (INDEPENDENT_AMBULATORY_CARE_PROVIDER_SITE_OTHER): Payer: Self-pay

## 2021-11-28 VITALS — BP 119/64 | HR 80 | Temp 98.2°F

## 2021-11-28 DIAGNOSIS — J069 Acute upper respiratory infection, unspecified: Secondary | ICD-10-CM

## 2021-11-28 NOTE — Progress Notes (Signed)
Chief Complaint:   Chief Complaint   Patient presents with   . URI     Ongoing since last Friday  Negative COVID 7/12  Tired, sneezing, congestion ,brain fog        Case summary is as follows: Brittany [name] is a 56 year old female with chief c/o dry cough and congestion since Friday (5 days). No fevers, SOB or wheezing. No underlying respiratory disease.  Negative at home COVID test today, COVID vaccinated. Around sick co worker last week. Tried otc alka seltzer cold.       Past Medical History:   Diagnosis Date   . Hypothyroidism     h/o hyperthyroidism s/p radioactive iodine   . Infectious mononucleosis 08/2006   . Insomnia    . Menorrhagia      Past Surgical History:   Procedure Laterality Date   . left wrist tendon repair  1998   . CHOLECYSTECTOMY, LAP  1995   . PB REMOVE TONSILS/ADENOIDS,<12 Y/O  1975   . PB MYOMECTOMY 1-4 MYOMAS 250 GM/< VAGINAL APPR     . wisdom teeth       Social History     Socioeconomic History   . Marital status: Divorced     Spouse name: Not on file   . Number of children: Not on file   . Years of education: Not on file   . Highest education level: Not on file   Occupational History   . Occupation: Office manager   Tobacco Use   . Smoking status: Never   . Smokeless tobacco: Never   . Tobacco comments:     2nd hand smoke   Substance and Sexual Activity   . Alcohol use: Yes     Comment: 1 bottle wine /week   . Drug use: No   . Sexual activity: Not Currently     Partners: Male   Other Topics Concern   . Not on file   Social History Narrative    08-19-17- Divorced and no kids. [place] and marketing and communication. Lives on own and social theatre/culture. Yoga and swimming/hiking.        August 20, 2018: Sister died of endometrial cancer at 67 yo this year. Very precipitous. Mother still alive in [place] living.        She is youngest-passing has caused much pain        2019/08/20: Hikes occasionally. Yoga on her own. Single.      Social Determinants of Health     Financial Resource Strain: Not on  file   Food Insecurity: Not on file   Transportation Needs: Not on file   Physical Activity: Insufficiently Active (11/28/2021)    Exercise is Medicine (EIM)    . Days of Exercise per Week: Not on file    . Minutes of Exercise per Session: Not on file   Stress: Not on file   Social Connections: Not on file   Intimate Partner Violence: Not on file   Housing Stability: Not on file     Family History   Problem Relation Name Age of Onset   . Breast Cancer Mother  57   . Other Sister          Uterine fibroids/hysterectomy   . Crohn's Disease Sister     . Breast Cancer Sister  63   . Cancer Sister          Endometrial cancer at 89   . Hypertension Father     .  Hypertension Sister     . Cholesterol/Lipid Disorder Sister     . Ovarian Cancer Neg Hx          but sister w/ovarian cysts age 34         Review of Systems -  Review of Systems   Constitutional: Positive for fatigue. Negative for chills and fever.   HENT: Positive for congestion and rhinorrhea. Negative for ear discharge, ear pain, postnasal drip, sinus pressure, sinus pain, sore throat and trouble swallowing.    Respiratory: Positive for cough. Negative for shortness of breath and wheezing.    Gastrointestinal: Negative for abdominal pain, diarrhea, nausea and vomiting.   Skin: Negative for rash.   Neurological: Negative for light-headedness and headaches.        Physical Examination:    11/28/21  1303   BP: 119/64   Pulse: 80   Temp: 98.2 F (36.8 C)   SpO2: 99%     Vital Signs noted from Triage Page.    General:  WDWN, no distress  Head: NCAT  Eyes: PERRL. No conjunctival injection.  Ears:  External ears without erythema, edema, or otorrhea.  TMs NML.   Mouth:  MMM.  Posterior oropharynx without erythema tonsillar edema, lesions, or exudate.  Uvula is midline. No uvulitis.  Nose: Mild nasal congestion. No rhinorrhea.  Neck:  Supple.  Non-tender. No lymphadenopathy. No nuchal rigidity  Chest: Lungs CTAB, respirations are non-labored. No active wheezing. Speaks in  complete sentences with no obvious respiratory distress.  Occasional  cough is noted.  Heart:  RRR.  Normal S1S2.  No peripheral edema, no cyanosis. Cap refill <2 sec.  Abdominal:  Non-distended  Extremities:  MAE x4   Neuro:  GCS 15. Normal mentation.   Skin: Warm, dry.  No rashes        Results:    Results for orders placed or performed in visit on 07/05/21   Lipid Panel Green Plasma Separator Tube   Result Value Ref Range    Cholesterol 265 (H) <200 mg/dL    HDL-Cholesterol 65 mg/dL    LDL-Chol (Calc) 188 (H) <160 mg/dL    Non-HDL Cholesterol 200 mg/dL    Triglycerides 60 10 - 170 mg/dL   CBC w/ Diff Lavender   Result Value Ref Range    WBC 4.6 4.0 - 10.0 1000/mm3    RBC 4.19 3.90 - 5.20 mill/mm3    Hgb 13.2 11.2 - 15.7 gm/dL    Hct 39.8 34.0 - 45.0 %    MCV 95.0 79.0 - 95.0 um3    MCH 31.5 26.0 - 32.0 pgm    MCHC 33.2 32.0 - 36.0 g/dL    RDW 11.7 (L) 12.0 - 14.0 %    MPV 11.5 9.4 - 12.4 fL    Plt Count 199 140 - 370 1000/mm3    Segs 36 %    Lymphocytes 58 %    Monocytes 6 %    Eosinophils 0 %    Basophils 0 %    ANC-Automated 1.7 1.6 - 7.0 1000/mm3    Abs Lymphs 2.7 0.8 - 3.1 1000/mm3    Abs Monos 0.3 0.2 - 0.8 1000/mm3    Abs Eosinophils 0.0 0.0 - 0.5 1000/mm3    Abs Basophils 0.0 <0.2 1000/mm3    Diff Type Automated                ICD-10-CM ICD-9-CM    1. Viral URI with cough  J06.9 465.9  Impression:     Well-appearing and nontoxic patient presenting to clinic with above complaints.    Constellation of symptoms is consistent with viral etiology. Declined COVID testing today since at home negative.     There are no adventitious breath sounds, pleuritic chest pains, tachypnea, tachycardia, hypoxia, dyspnea, chills or fevers or evidence to suggest pneumonia. No evidence of bronchoconstriction/reactive airway disease. Not in acute respiratory distress. No stridor or increased work of breathing or evidence of epiglottitis. Do not suspect PE, myocarditis, pericarditis, acute otitis media, acute bacterial  sinusitis, dehydration.    Recommend supportive/symptomatic treatment.    Plan:  Maintain a good fluid intake.  Normal diet as tolerated.  Do some steam inhalation or use a cool-mist vaporizer to help loosen the secretions.  Use Tylenol or Motrin 3-4 times/day as needed for fever or discomfort.  May take Mucinex DM or Delsym OTC (non prescription) for cough.   Call your primary care physician or return for any worsening weakness, or more head or chest congestion or heavier coughing, or higher fevers   I discussed with patient/caregiver the nature of this patient's illness/problem, results of all resulted tests, course of treatment, and prospects for recovery/follow up care needs.   Parameters of returning to [place] or ED also discussed with patient. The patient/caregiver understands to return immediately if the symptoms worsen or new symptoms develop.   Patient/caregiver verbalized understanding and agreement with plan and all questions answered.      Portions of this encounter were used with voice recognition software. Some errors, erroneous grammar and misrepresented words may be reflected in the dictation.     Cassell Clement, DNP, FNP-C    Supervising physician for this visit is Dr. Roselyn Reef

## 2021-11-28 NOTE — Patient Instructions (Signed)
Maintain a good fluid intake.  Normal diet as tolerated.  Do some steam inhalation or use a cool-mist vaporizer to help loosen the secretions.  Use Tylenol or Motrin 3-4 times/day as needed for fever or discomfort.  May take Mucinex DM or Delsym OTC (non prescription) for cough.   Call your primary care physician or return for any worsening weakness, or more head or chest congestion or heavier coughing, or higher fevers

## 2021-12-04 ENCOUNTER — Encounter (INDEPENDENT_AMBULATORY_CARE_PROVIDER_SITE_OTHER): Payer: Self-pay | Admitting: Student in an Organized Health Care Education/Training Program

## 2021-12-04 DIAGNOSIS — E039 Hypothyroidism, unspecified: Secondary | ICD-10-CM

## 2021-12-04 DIAGNOSIS — E785 Hyperlipidemia, unspecified: Secondary | ICD-10-CM

## 2021-12-04 NOTE — Telephone Encounter (Signed)
Please let patient know I had sent her a MyChart message about her February lab results in February.  If I did not specifically address a certain lab result, that means it's normal / no concerning findings, and any lab values marked abnormal can vary with measurement and are not clinically significant.  I ordered a TSH per request. Last TSH was in 03/2021. TSH is checked once a year unless dosage changes are made.

## 2021-12-04 NOTE — Telephone Encounter (Signed)
Immunization record updated using Query Imm Registry.

## 2021-12-04 NOTE — Telephone Encounter (Addendum)
Routing to Dr. Charlynn Court for review and advise.  Added TSH, approve if appropriate.    From: [name]  To: [name], MD  Sent: 12/04/2021  7:57 AM PDT  Subject: Question regarding CBC WITH DIFF, BLOOD    Hi Dr. Charlynn Court,    Some of the results are low and some are high. Is there any result of concern?     In the past MyChart used to show comments from the doctor on bloodwork results, but I don't see any.    Also, please add Thyroid bloodwork to current fasting lipid bloodwork, then I will schedule.    Thank you,  Emagene     Latest Ref Rng 07/05/2021  8:16 AM   WBC 4.0 - 10.0 1000/mm3 4.6    RBC 3.90 - 5.20 mill/mm3 4.19    Hgb 11.2 - 15.7 gm/dL 13.2    Hct 34.0 - 45.0 % 39.8    MCV 79.0 - 95.0 um3 95.0    MCH 26.0 - 32.0 pgm 31.5    MCHC 32.0 - 36.0 g/dL 33.2    RDW 12.0 - 14.0 % 11.7 (L)    MPV 9.4 - 12.4 fL 11.5    Plt Count 140 - 370 1000/mm3 199    Segs % 36    Lymphocytes % 58    Monocytes % 6    Eosinophils % 0    Basophils % 0    ANC-Automated 1.6 - 7.0 1000/mm3 1.7    Abs Lymphs 0.8 - 3.1 1000/mm3 2.7    Abs Monos 0.2 - 0.8 1000/mm3 0.3    Abs Eosinophils 0.0 - 0.5 1000/mm3 0.0    Abs Basophils <0.2 1000/mm3 0.0    Diff Type  Automated

## 2021-12-04 NOTE — Telephone Encounter (Signed)
From: Brittany [name]  To: [name], MD  Sent: 12/04/2021 8:03 AM PDT  Subject: Received Hep B vaccine    Please add to my immunization record that I rcvd both doses of Hep B vaccine, from the [facility] on [address].     Dose 2 received in June 2023.  Thank you.

## 2021-12-05 ENCOUNTER — Ambulatory Visit (HOSPITAL_BASED_OUTPATIENT_CLINIC_OR_DEPARTMENT_OTHER): Payer: BLUE CROSS/BLUE SHIELD

## 2021-12-22 ENCOUNTER — Other Ambulatory Visit (INDEPENDENT_AMBULATORY_CARE_PROVIDER_SITE_OTHER): Payer: Self-pay | Admitting: Student in an Organized Health Care Education/Training Program

## 2021-12-22 DIAGNOSIS — E039 Hypothyroidism, unspecified: Secondary | ICD-10-CM

## 2021-12-24 NOTE — Telephone Encounter (Signed)
*The requested med passed all protocol parameters in the associated med info guide - Protocol details reviewed by pharmacist.       Hypothyroid Medication Refill Protocol    Last visit in enc specialty: 06/29/2021     Recent Visits in This Encounter Department       Date Provider Department Visit Type Primary Dx    06/29/2021 [name], MD [place] [facility] Office Visit Allergic rhinitis, unspecified seasonality, unspecified trigger    10/06/2020 Hakim, Cleophas Dunker, MD [place] [facility] Office Visit Acquired hypothyroidism    05/16/2020 Hakim, Cleophas Dunker, MD [facility] Office Visit Chest pain, unspecified type    02/25/2020 Hakim, Cleophas Dunker, MD  [place] [facility] Office Visit Encounter for routine adult medical examination    01/25/2020 Rennis Chris, MD, PhD [facility] Office Visit Plantar fasciitis, bilateral           Population Health Visits  Recent [facility] Visits    None       Next appt in enc specialty: Visit date not found      Future Appointments 12/24/2021 - 12/23/2026        Date Visit Type Department Provider     01/15/2022  4:00 PM [facility] Deak, Renee Pain, MD    Appointment Notes:     [facility]             05/01/2022  2:45 PM [facility] Marguerite Olea, MD    Appointment Notes:     tbc 1 year                         **If no recent dose changes, and last TSH (and T4 for Liothyronine only) normal/within date, no recent notes will be reviewed per Pharmacy Dept protocol**      LABS required:  (Q year TSH)  *If recent dose change: check TSH 2 months after starting new  dose.    Lab Results   Component Value Date    TSH 0.70 03/23/2021    FREET4 1.82 (H) 11/25/2014         Monitoring required:  (Q year BP, HR, Wt)   *If pt is pregnant,  DEFER TO MD*    (BP range: FBPZWCHE=52-778  EUMPNTIRW=43-15)  Blood Pressure   11/28/21 119/64   06/29/21 106/70   04/27/21 127/78       (HR range: 55-110)   Pulse Readings from Last 3 Encounters:   11/28/21 80   06/29/21 92   04/27/21 77       Wt Readings from Last 3 Encounters:   06/29/21 88 kg (194 lb)   10/06/20 88.5 kg (195 lb)   05/16/20 86.2 kg (190 lb)         Last Digital Health Monitoring Vitals:        Last MyChart BP Values:        Last Pt Entered MyChart BP Values:             Hypothyroid Medication Refill Protocol    -----------------------------------------------------------------------------------------------------------    The above technician note was evaluated by the pharmacist.  Pharmacist Assessment and Plan:    90 + 0 RF has lab appt  Lauralyn Primes, PharmD, [facility]   Refill and Prior [place]  Phone:  [phone]  Ext:  [phone]

## 2021-12-24 NOTE — Telephone Encounter (Signed)
Addressed in separate refill encounter 12/22/21.  Duplicate request.  Closing.

## 2021-12-25 MED ORDER — SYNTHROID 112 MCG OR TABS
112.0000 ug | ORAL_TABLET | ORAL | 0 refills | Status: DC
Start: 2021-12-25 — End: 2022-03-14

## 2021-12-25 MED ORDER — SYNTHROID 100 MCG OR TABS
100.0000 ug | ORAL_TABLET | ORAL | 0 refills | Status: DC
Start: 2021-12-25 — End: 2022-03-14

## 2021-12-27 ENCOUNTER — Other Ambulatory Visit: Payer: BLUE CROSS/BLUE SHIELD | Attending: Student in an Organized Health Care Education/Training Program

## 2021-12-27 DIAGNOSIS — E785 Hyperlipidemia, unspecified: Secondary | ICD-10-CM | POA: Insufficient documentation

## 2021-12-27 DIAGNOSIS — E039 Hypothyroidism, unspecified: Secondary | ICD-10-CM | POA: Insufficient documentation

## 2021-12-27 LAB — COMPREHENSIVE METABOLIC PANEL, BLOOD
ALT (SGPT): 19 U/L (ref 0–33)
AST (SGOT): 22 U/L (ref 0–32)
Albumin: 4.5 g/dL (ref 3.5–5.2)
Alkaline Phos: 72 U/L (ref 40–130)
Anion Gap: 11 mmol/L (ref 7–15)
BUN: 13 mg/dL (ref 6–20)
Bicarbonate: 24 mmol/L (ref 22–29)
Bilirubin, Tot: 1.74 mg/dL — ABNORMAL HIGH (ref <1.2)
Calcium: 9.7 mg/dL (ref 8.5–10.6)
Chloride: 107 mmol/L (ref 98–107)
Creatinine: 0.83 mg/dL (ref 0.51–0.95)
Glucose: 103 mg/dL — ABNORMAL HIGH (ref 70–99)
Potassium: 3.9 mmol/L (ref 3.5–5.1)
Sodium: 142 mmol/L (ref 136–145)
Total Protein: 6.7 g/dL (ref 6.0–8.0)
eGFR Based on CKD-EPI 2021 Equation: >60 mL/min/{1.73_m2}

## 2021-12-27 LAB — LIPID(CHOL FRACT) PANEL, BLOOD
Cholesterol: 162 mg/dL (ref <200)
HDL-Cholesterol: 67 mg/dL
LDL-Chol (Calc): 80 mg/dL (ref <160)
Non-HDL Cholesterol: 95 mg/dL
Triglycerides: 74 mg/dL (ref 10–170)

## 2021-12-27 LAB — TSH, BLOOD: TSH: 0.35 u[IU]/mL (ref 0.27–4.20)

## 2021-12-27 NOTE — Interdisciplinary (Signed)
Blood drawn from right arm with 21 gauge needle. 1 tubes taken.   Patient identity authenticated by Lisa Romero.

## 2021-12-31 ENCOUNTER — Encounter (INDEPENDENT_AMBULATORY_CARE_PROVIDER_SITE_OTHER): Payer: Self-pay | Admitting: Student in an Organized Health Care Education/Training Program

## 2021-12-31 DIAGNOSIS — R17 Unspecified jaundice: Secondary | ICD-10-CM

## 2022-01-15 ENCOUNTER — Ambulatory Visit (INDEPENDENT_AMBULATORY_CARE_PROVIDER_SITE_OTHER): Payer: BLUE CROSS/BLUE SHIELD | Admitting: Obstetrics & Gynecology

## 2022-01-16 ENCOUNTER — Other Ambulatory Visit: Payer: BLUE CROSS/BLUE SHIELD

## 2022-01-16 DIAGNOSIS — Z1159 Encounter for screening for other viral diseases: Secondary | ICD-10-CM

## 2022-01-16 NOTE — Interdisciplinary (Signed)
Specimen Verified    2 Patient Identifiers verified    Patients Name Brittany [name]    Age 56 year old   Patient Date of Birth 1966-03-01   Specimen Collected on 01/16/22       Before sending the specimen to the laboratory, specimen collection  transport has been carefully secure and contained, with the adequately affixed label that matches patient identifiers, site, and lab requisition form by Marshia Ly, LVN on January 16, 2022

## 2022-01-17 ENCOUNTER — Ambulatory Visit (INDEPENDENT_AMBULATORY_CARE_PROVIDER_SITE_OTHER): Payer: BLUE CROSS/BLUE SHIELD | Admitting: Obstetrics & Gynecology

## 2022-01-17 ENCOUNTER — Encounter (INDEPENDENT_AMBULATORY_CARE_PROVIDER_SITE_OTHER): Payer: Self-pay | Admitting: Obstetrics & Gynecology

## 2022-01-17 VITALS — BP 121/84 | HR 92 | Temp 97.3°F | Resp 16 | Ht 69.0 in | Wt 204.8 lb

## 2022-01-17 DIAGNOSIS — N951 Menopausal and female climacteric states: Secondary | ICD-10-CM

## 2022-01-17 DIAGNOSIS — M722 Plantar fascial fibromatosis: Secondary | ICD-10-CM

## 2022-01-17 DIAGNOSIS — Z01419 Encounter for gynecological examination (general) (routine) without abnormal findings: Secondary | ICD-10-CM

## 2022-01-17 LAB — COVID-19 CORONAVIRUS DETECTION ASSAY AT ~~LOC~~ LAB: COVID-19 Coronavirus Result: NOT DETECTED

## 2022-01-17 MED ORDER — DESVENLAFAXINE SUCCINATE 50 MG OR TB24
50.0000 mg | ORAL_TABLET | Freq: Every day | ORAL | 4 refills | Status: DC
Start: 2022-01-17 — End: 2023-02-12

## 2022-01-17 MED ORDER — DESVENLAFAXINE SUCCINATE ER 25 MG PO TB24
25.0000 mg | ORAL_TABLET | Freq: Every day | ORAL | 4 refills | Status: DC
Start: 2022-01-17 — End: 2023-02-12

## 2022-01-17 NOTE — Progress Notes (Signed)
Interval History  Brittany [name] is a 56 year old female who is here for   Chief Complaint   Patient presents with    Gyn Exam     wwe   .    VITALS: BP 121/84 (BP Location: Right arm, BP Patient Position: Sitting, BP cuff size: Regular)   Pulse 92   Temp 97.3 F (36.3 C) (Oral)   Resp 16   Ht '5\' 9"'$  (1.753 m)   Wt 92.9 kg (204 lb 12.8 oz)   LMP 08/19/2019   SpO2 96%   BMI 30.24 kg/m     Allergies   Allergen Reactions    Vicodin [Hydrocodone-Acetaminophen] Hallucinations       1.  OB/GYN HISTORY:  OB History   Gravida Para Term Preterm AB Living   0 0 0 0 0 0   SAB IAB Ectopic Multiple Live Births   0 0 0 0 0     Patient's last menstrual period was 08/19/2019.   pt denies any bothersome gyn symptoms.  Denies any vaginal bleeding or abnormal discharge.  Pt reports doing well.  Had been on low dose ocp for many years, then discontinued as menopausal.  Started pritiq for menopausal sx.and ultimately did well on '75mg'$  daily dosing.      Mmg 04/02/21  Pap 02/01/20 normal/neg hpv       Review of Systems  Constitutional: Negative  Eyes: Negative  ENT: Negative  Cardiac: Negative  Pulmonary: Negative  Gastrointestional: Negative  Musculoskeletal: Negative  Skin: Negative  Neurologic: Negative  Psychiatric: Negative  Endocrine: Negative  Blood Disease: Negative  Allergy: Negative  OB/Gyn: Negative      I did review available medical, surgical, obstetrical and social history.    PHYSICAL EXAM:  Head: negative  Neck:  thyroid normal  Breasts: no lymphadenopathy, no skin changes, no masses or discharge  Abdomen: abdomen soft, non-tender, BS normal, no masses or HSM  Pelvic: normal external female genitalia  Vulva/Vagina: normal  Cervix: normal in appearance, no lesions or masses  Uterus: normal sized, mobile, and non-tender  Adnexa: no adnexal masses or tenderness  Rectal Exam: not performed  Abnormal Findings: none    IMPRESSION/ PLAN: Normal gyn exam    HCM: PAP Smear and mammogram utd  Refill  pristiq    Counseling: Monthly SBE          Patient barriers to [place]: none    Pt/Family understanding: verbalizes    Follow-Up: Return in 1 year for annual exam or  sooner if problems should occur.    Authored by: Hinda Kehr, MD

## 2022-02-02 ENCOUNTER — Other Ambulatory Visit (INDEPENDENT_AMBULATORY_CARE_PROVIDER_SITE_OTHER): Payer: Self-pay | Admitting: Student in an Organized Health Care Education/Training Program

## 2022-02-02 DIAGNOSIS — J301 Allergic rhinitis due to pollen: Secondary | ICD-10-CM

## 2022-02-03 ENCOUNTER — Encounter (INDEPENDENT_AMBULATORY_CARE_PROVIDER_SITE_OTHER): Payer: Self-pay | Admitting: Student in an Organized Health Care Education/Training Program

## 2022-02-03 MED ORDER — CETIRIZINE HCL 10 MG OR TABS
10.00 mg | ORAL_TABLET | Freq: Every day | ORAL | 3 refills | Status: AC
Start: 2022-02-03 — End: ?

## 2022-02-13 ENCOUNTER — Ambulatory Visit: Payer: BLUE CROSS/BLUE SHIELD

## 2022-02-13 DIAGNOSIS — Z23 Encounter for immunization: Secondary | ICD-10-CM

## 2022-03-01 IMAGING — MR COLUNA^LOMBAR HSM
4 series · 37 of 48 positions shown · non-contrast
Comparison: none

[Series 100: t2_tse_sag_384_(person_name)_1 · sagittal · 3.0mm · 0.83mm/px · 9 of 18 slices shown]
[im 1/18]
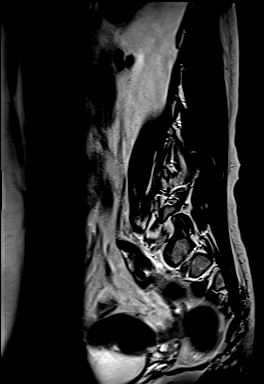
[im 3/18]
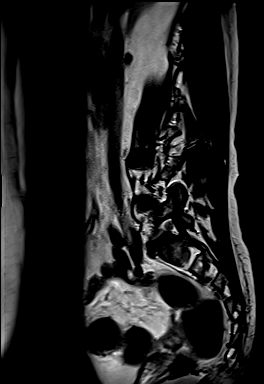
[im 5/18]
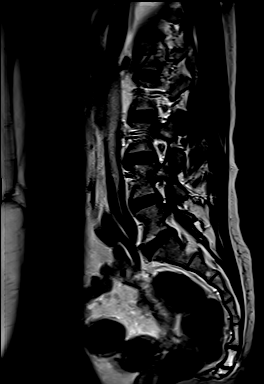
[im 7/18]
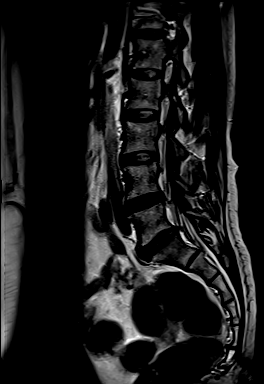
[im 9/18]
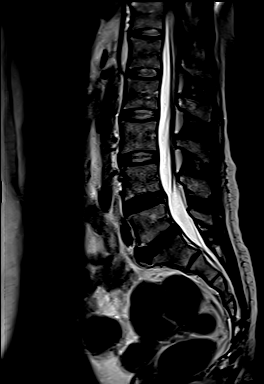
[im 11/18]
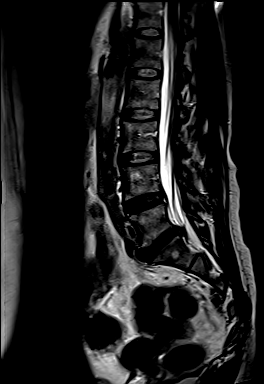
[im 13/18]
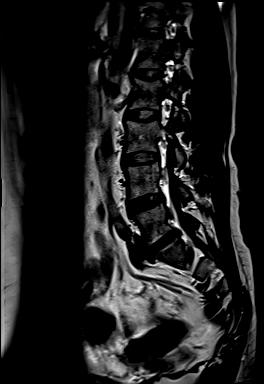
[im 15/18]
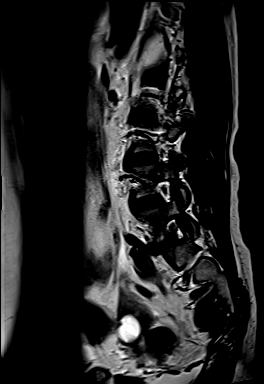
[im 18/18]
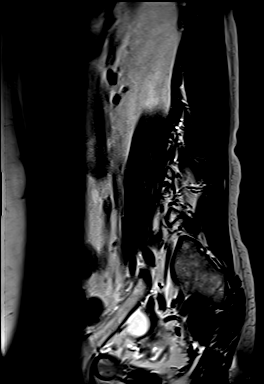

[Series 101: t1_tse_sag_384_(person_name)_1 · sagittal · 3.0mm · 0.83mm/px · 8 of 18 slices shown]
[im 1/18]
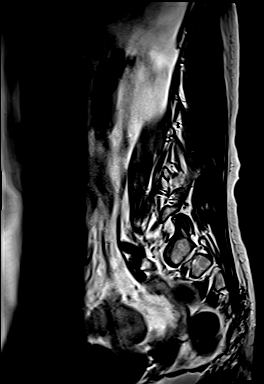
[im 3/18]
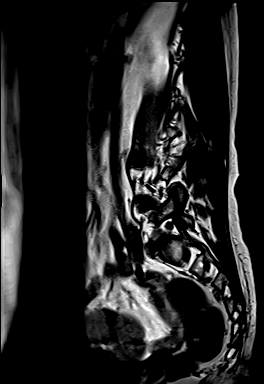
[im 5/18]
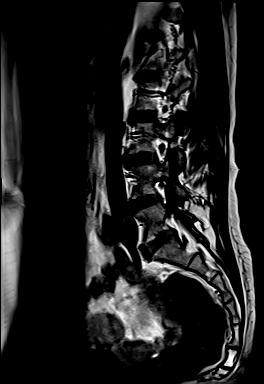
[im 8/18]
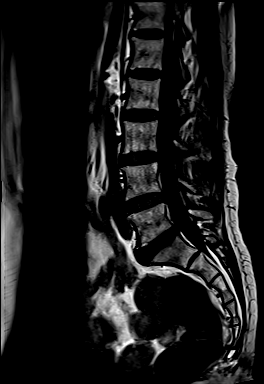
[im 10/18]
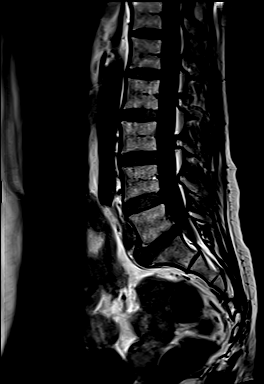
[im 13/18]
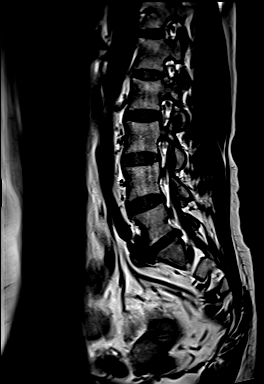
[im 15/18]
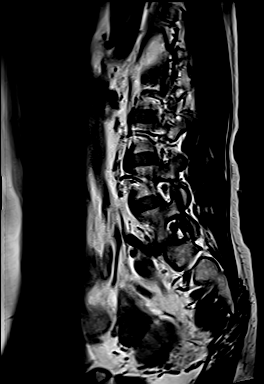
[im 18/18]
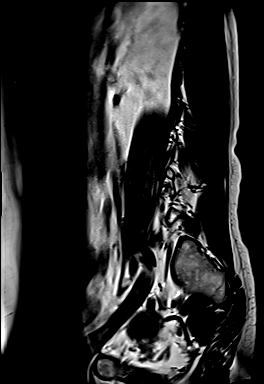

[Series 102: t2_tirm_sag_p2_(person_name)_1 · sagittal · 3.0mm · 1.00mm/px · 8 of 18 slices shown]
[im 1/18]
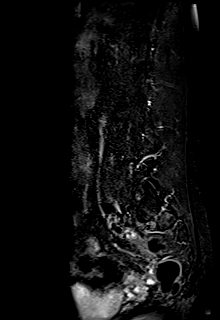
[im 3/18]
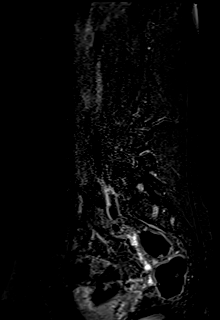
[im 5/18]
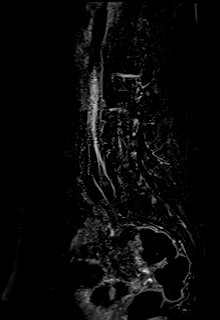
[im 8/18]
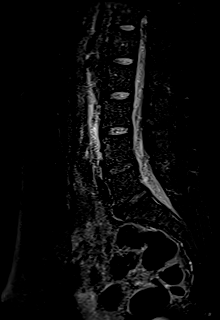
[im 10/18]
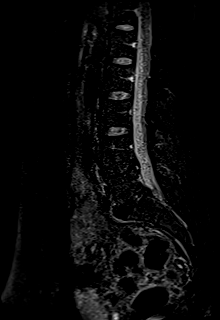
[im 13/18]
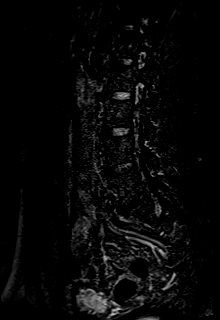
[im 15/18]
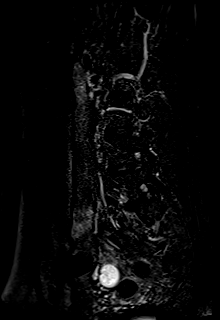
[im 18/18]
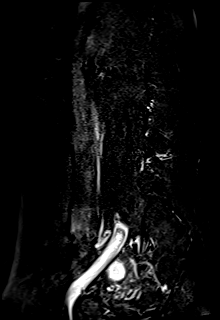

[Series 103: t2_tse_tra siemens_(person_name)_1 · axial · 3.5mm · 0.49mm/px · z∈[-204,+14]mm · 12 of 48 slices shown]
[im 1/48]
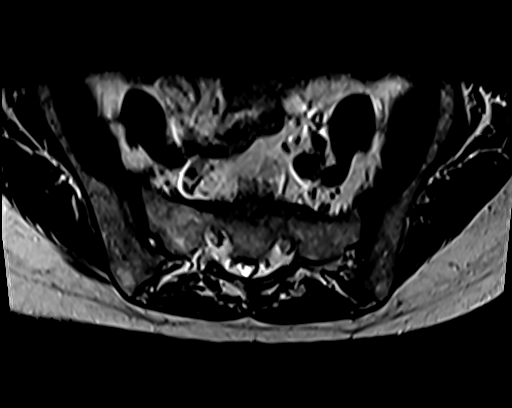
[im 3/48]
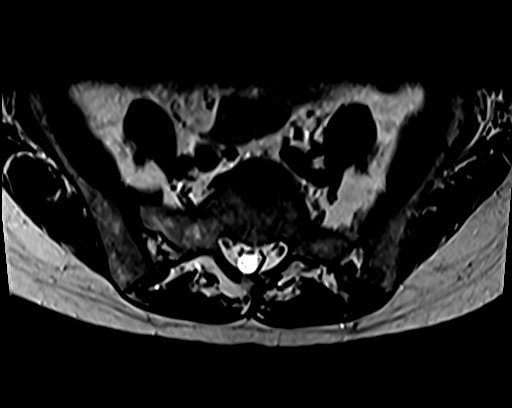
[im 7/48]
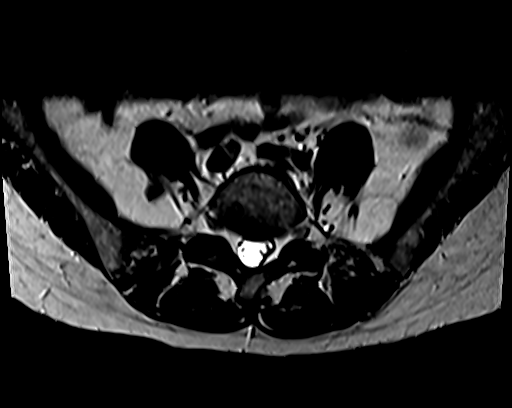
[im 9/48]
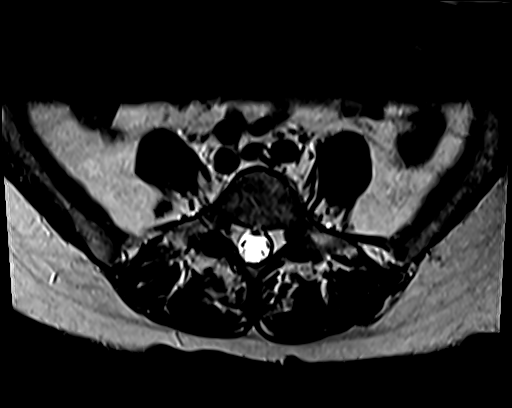
[im 15/48]
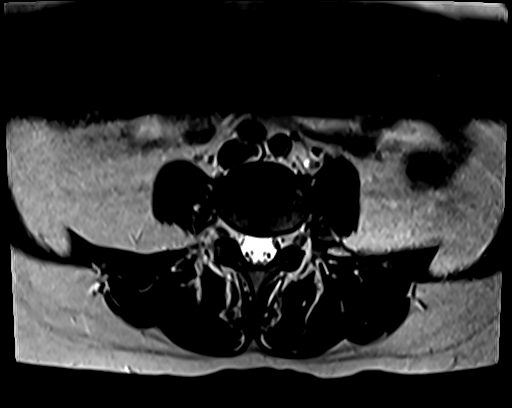
[im 22/48]
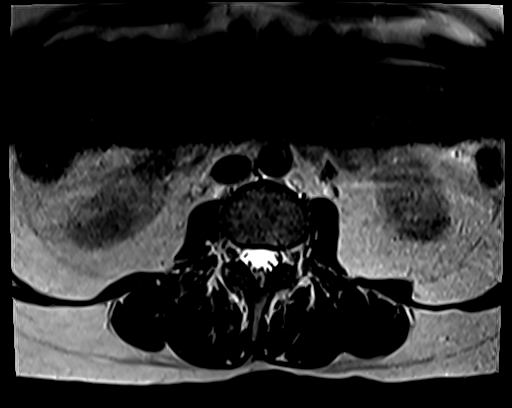
[im 24/48]
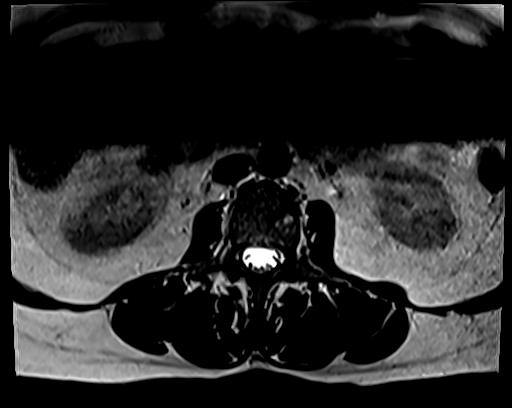
[im 26/48]
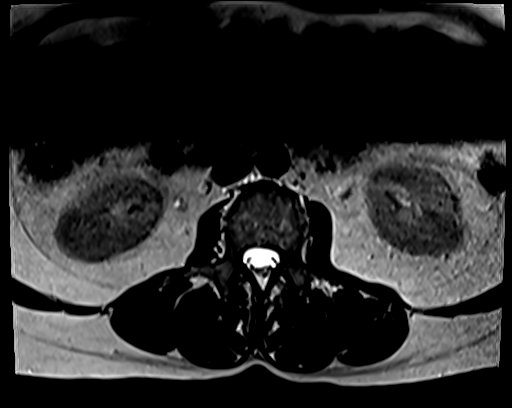
[im 33/48]
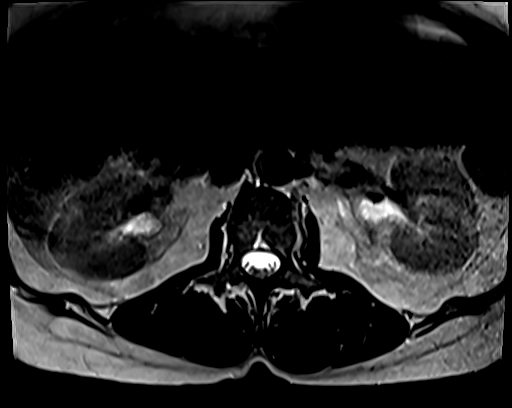
[im 39/48]
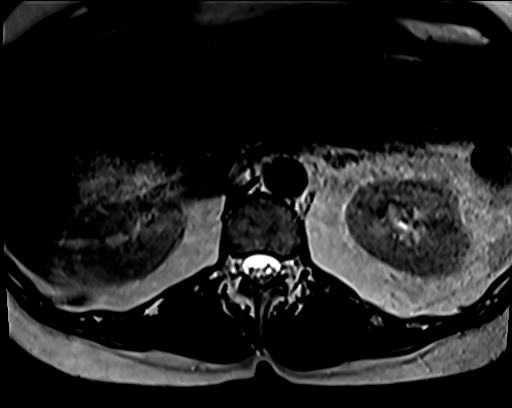
[im 41/48]
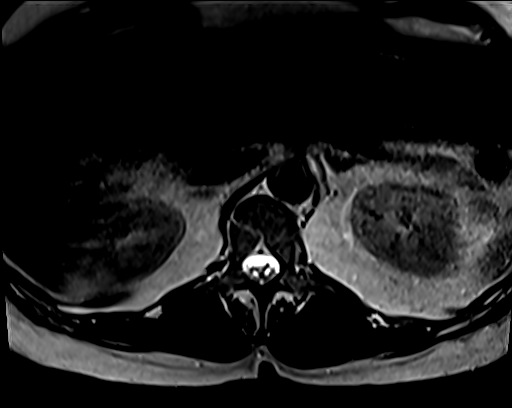
[im 45/48]
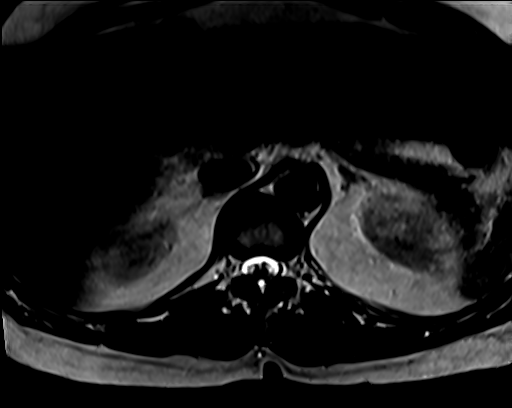

[37 of 48 positions shown; findings below may reference images not displayed]

Paciente:HOI CHEUNG HOULIHAN
Técnica:
Exame realizado com sequências ponderadas em T1 e T2, sem a administração intravenosa do agente de contraste
paramagnético.
Relatório:
Logo Clinica
O canal vertebral exibe dimensões normais por toda extensão avaliada.
Corpos vertebrais com altura preservada, apresentando osteófitos marginais incipientes.
RESSONÂNCIA MAGNÉTICA DA COLUNA LOMBOSSACRA
Desidratação discal parcial em L4-L5 e L5-S1.
Hipertrofia das articulações interfacetárias ao nível de L3-L4 a L5-S1.
Nível L4-L5: Protrusão discal posterior, determinando impressão dural anterior. Fissura do olho fibroso
Nível L5-S1: Abaulamento discal difuso com extensão para as bases foraminais, reduzindo a amplitude dos
mesmos.Existe contato radicular com a raiz emergente L5 a esquerda.
O cone medular é tópico, sendo de calibre e intensidade de sinal normais.
Raízes nervosas da cauda eqüina de morfologia e distribuição anatômica.
Impressão:
Corpos vertebrais com altura preservada, apresentando osteófitos marginais incipientes.
Desidratação discal parcial em L4-L5 e L5-S1.
Hipertrofia das articulações interfacetárias ao nível de L3-L4 a L5-S1.
Nível L4-L5: Protrusão discal posterior, determinando impressão dural anterior. Fissura do olho fibroso
Nível L5-S1: Abaulamento discal difuso com extensão para as bases foraminais, reduzindo a amplitude dos
mesmos.Existe contato radicular com a raiz emergente L5 a esquerda.
[facility] [id number] [phone],
[place] - [place]

## 2022-03-01 IMAGING — MR COLUNA^CERVICAL HSM
4 series · 33 of 48 positions shown · non-contrast
Comparison: none

[Series 6: t2_tse_sag_384_(person_name)_1 · sagittal · 3.3mm · 0.57mm/px · 8 of 15 slices shown]
[im 1/15]
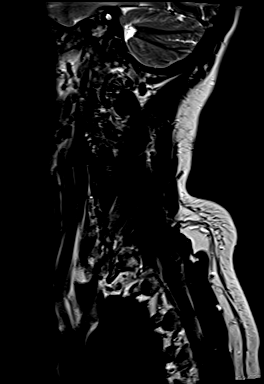
[im 2/15]
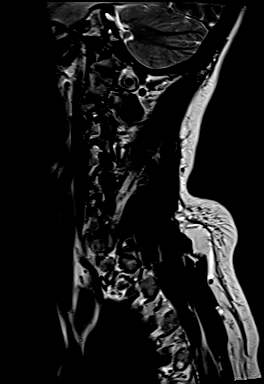
[im 5/15]
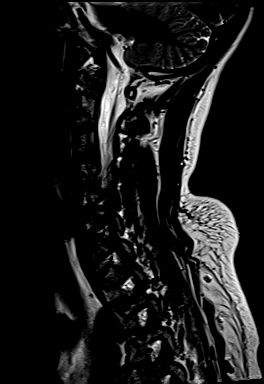
[im 7/15]
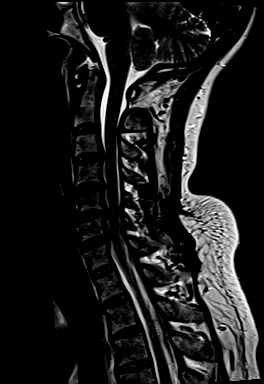
[im 8/15]
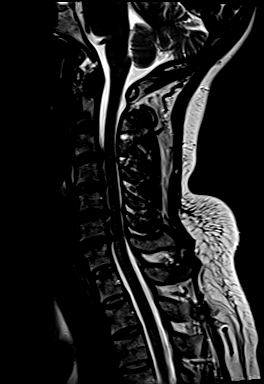
[im 10/15]
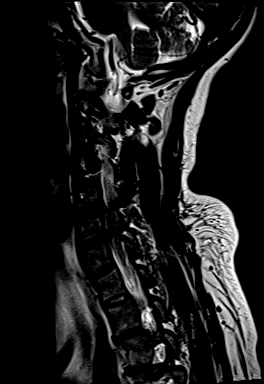
[im 13/15]
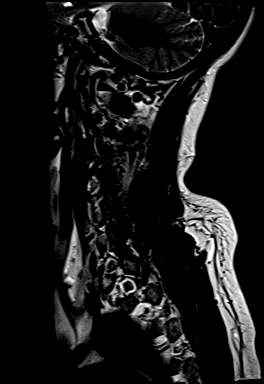
[im 15/15]
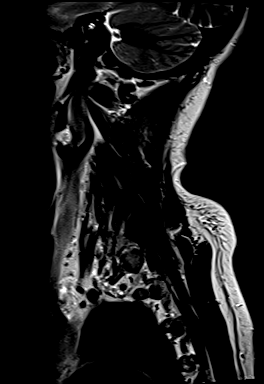

[Series 7: t1_tse_sag_384_(person_name)_1 · sagittal · 3.3mm · 0.57mm/px · 9 of 15 slices shown]
[im 1/15]
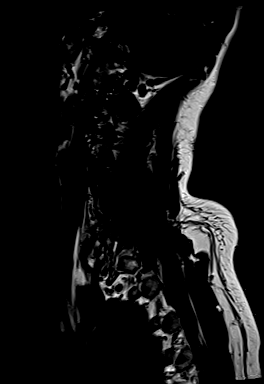
[im 2/15]
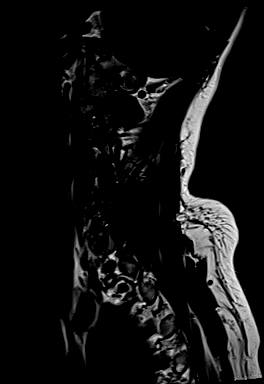
[im 4/15]
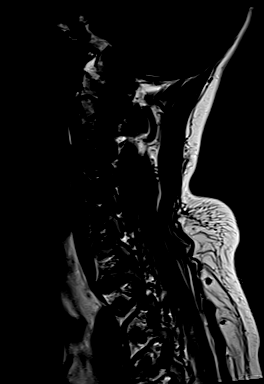
[im 6/15]
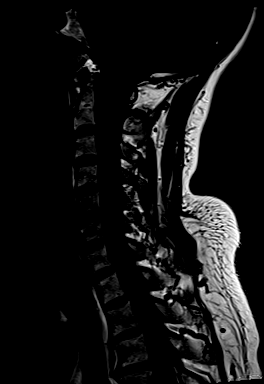
[im 8/15]
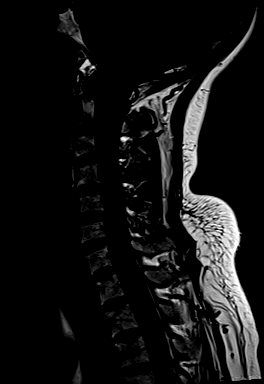
[im 9/15]
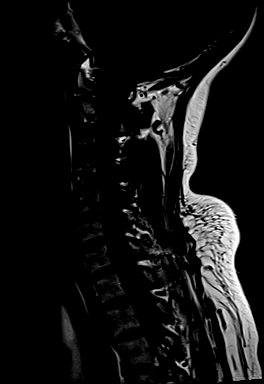
[im 11/15]
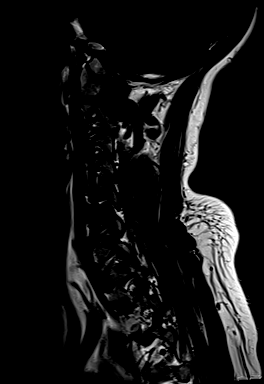
[im 13/15]
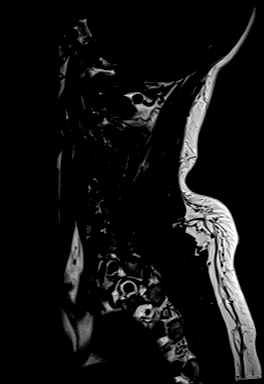
[im 15/15]
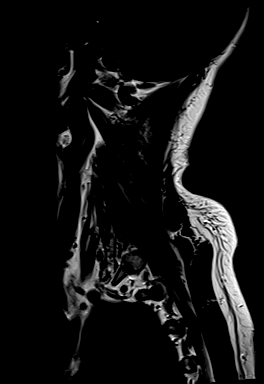

[Series 8: t2_tirm_sag_p2_(person_name)_1 · sagittal · 3.3mm · 0.43mm/px · 9 of 15 slices shown]
[im 1/15]
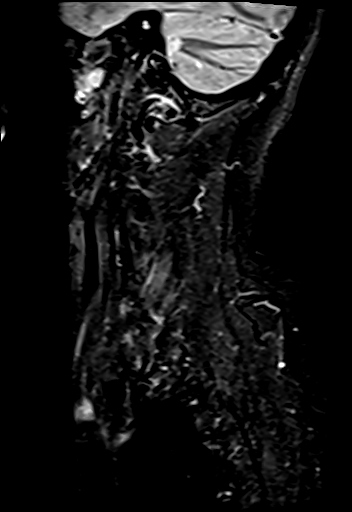
[im 2/15]
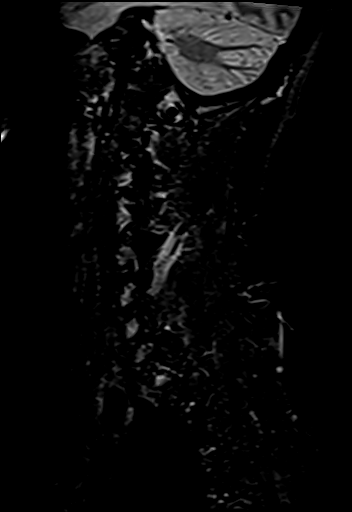
[im 4/15]
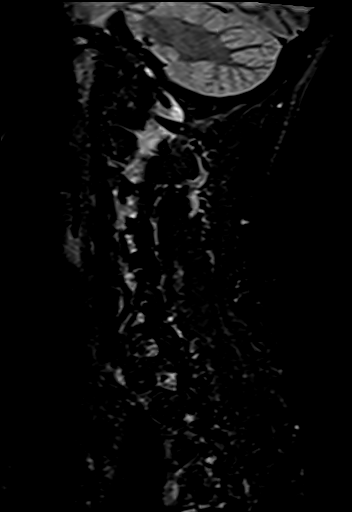
[im 6/15]
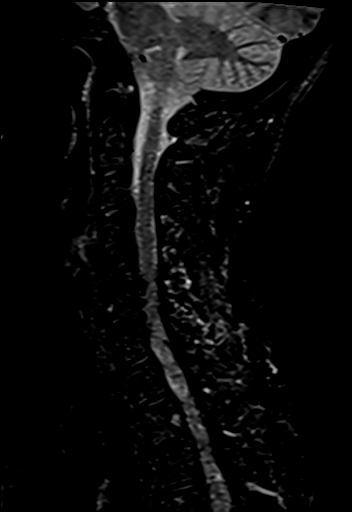
[im 8/15]
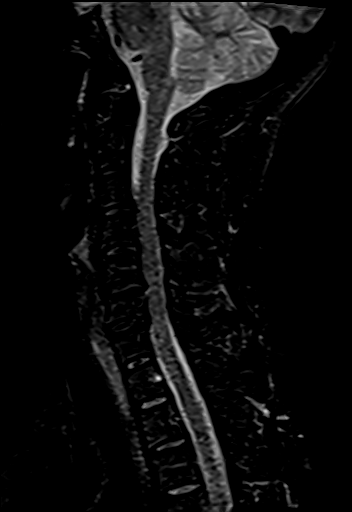
[im 9/15]
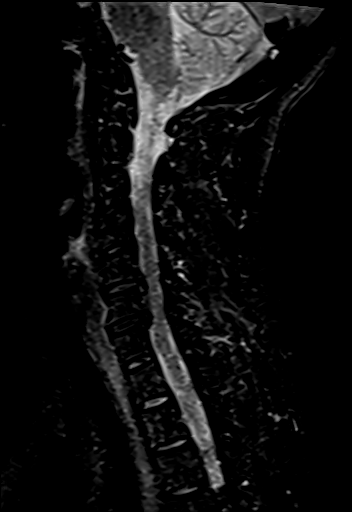
[im 11/15]
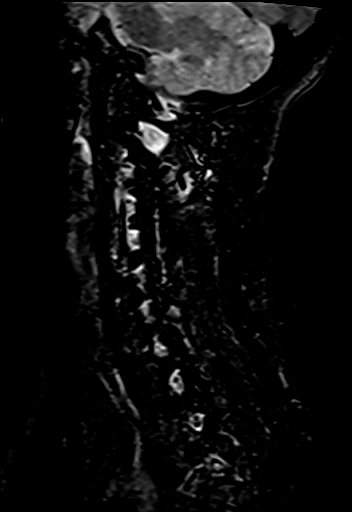
[im 13/15]
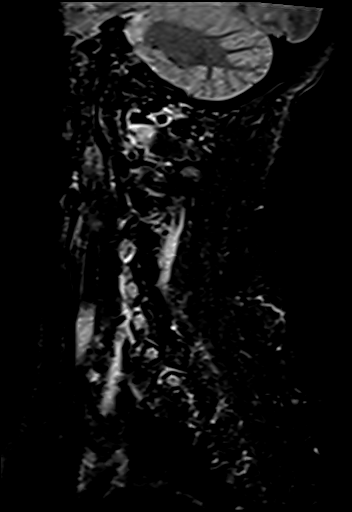
[im 15/15]
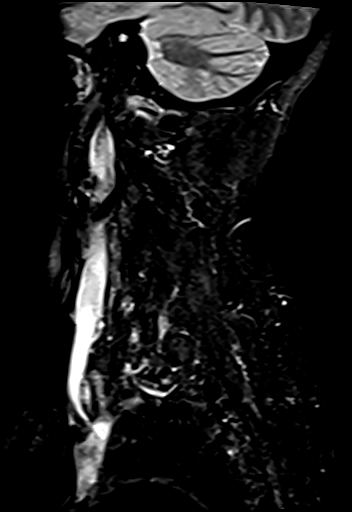

[Series 9: t2_tse_tra siemens_(person_name)_1 · axial · 3.5mm · 0.41mm/px · z∈[-36,+65]mm · 7 of 33 slices shown]
[im 2/33]
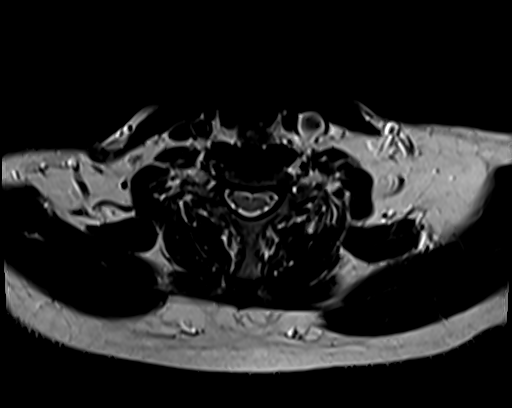
[im 6/33]
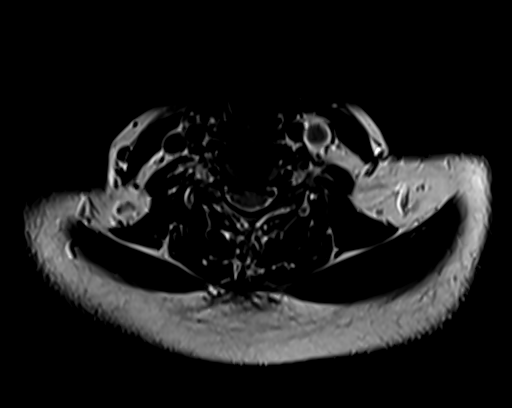
[im 11/33]
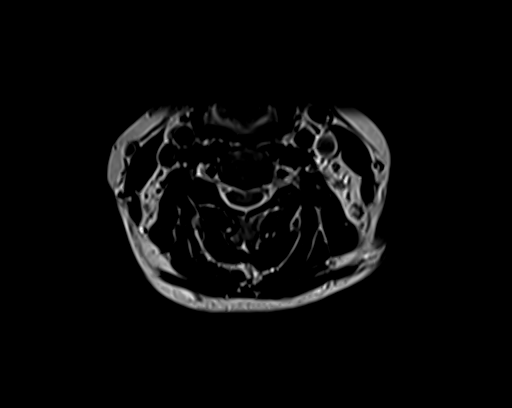
[im 14/33]
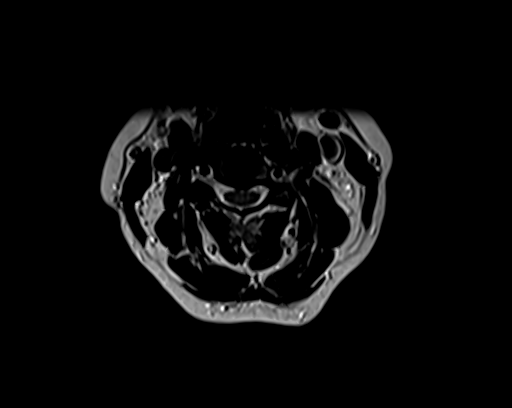
[im 17/33]
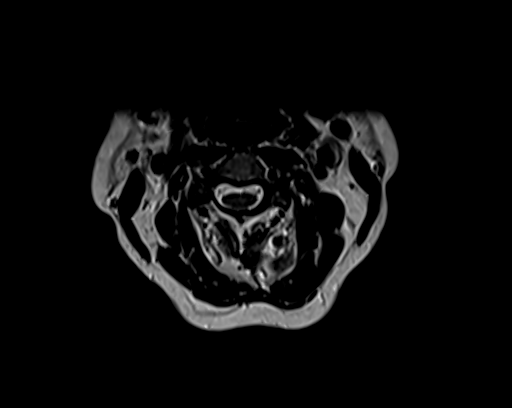
[im 19/33]
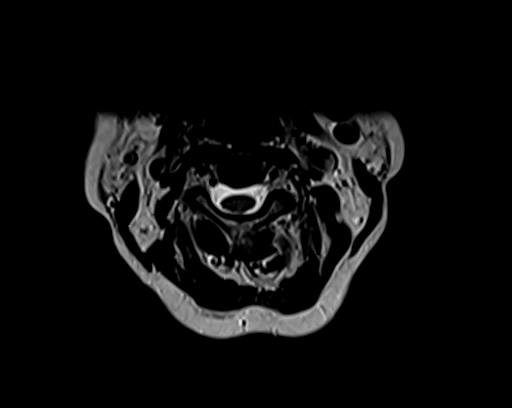
[im 27/33]
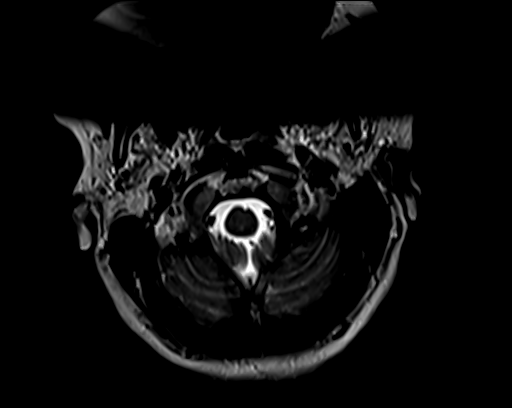

[33 of 48 positions shown; findings below may reference images not displayed]

Paciente:MAYKY TELEKEN
METODOLOGIA:
Exame realizado com sequências ponderadas em T1 e T2, sem a administração intravenosa do agente de contraste
paramagnético.
ANÁLISE:
Logo Clinica
Transição crânio cervical de aspecto anatômico.
O canal vertebral exibe dimensões normais por toda extensão avaliada.
Retificação da lordose cervical.
RESSONÂNCIA MAGNÉTICA DA COLUNA CERVICAL
Corpos vertebrais com altura preservada, apresentando osteófitos marginais.
Desidratação discal nos  diversos níveis estudados.
Hipertrofia das articulações interfacetárias em C3-C4 a C6-C7.
Nível C3-C4: Protrusão disco-osteofitária posterior e focal, determinando compressão medular anterior.
Nível C4-C5: Protrusão disco-osteofitária posterior, determinando impressão dural anterior.
Nível C5-C6: Protrusão disco-osteofitária posterior com base larga, determinando impressão medular anterior.
Nível C6-C7: Protrusão disco-osteofitária posterior, determinando compressão dural anterior.
A medula espinhal cervical foi visibilizada em toda sua extensão, sendo de calibre e sinal normais.
IMPRESSÃO:
Retificação da lordose cervical.
Corpos vertebrais com altura preservada, apresentando osteófitos marginais.
Desidratação discal nos  diversos níveis estudados.
Hipertrofia das articulações interfacetárias em C3-C4 a C6-C7.
Nível C3-C4: Protrusão disco-osteofitária posterior e focal, determinando compressão medular anterior.
[facility] - [id number] [place] [phone],
[place] - [place]
Paciente:MAYKY TELEKEN
Nível C4-C5: Protrusão disco-osteofitária posterior, determinando impressão dural anterior.
Nível C5-C6: Protrusão disco-osteofitária posterior com base larga, determinando impressão medular anterior.
Nível C6-C7: Protrusão disco-osteofitária posterior, determinando compressão dural anterior.
Logo Clinica
[facility] - [address] [phone] [phone],
[place] - [place]

## 2022-03-04 ENCOUNTER — Other Ambulatory Visit (INDEPENDENT_AMBULATORY_CARE_PROVIDER_SITE_OTHER): Payer: Self-pay | Admitting: Student in an Organized Health Care Education/Training Program

## 2022-03-04 DIAGNOSIS — J309 Allergic rhinitis, unspecified: Secondary | ICD-10-CM

## 2022-03-05 MED ORDER — AZELASTINE HCL 137 MCG/SPRAY NA SOLN
1.00 | Freq: Two times a day (BID) | NASAL | 1 refills | Status: AC
Start: 2022-03-05 — End: ?

## 2022-03-05 NOTE — Telephone Encounter (Signed)
Patient MyChart request    Raquel Gwinda Passe, CPhT  ([facility])      Antihistamine Refill Protocol    Last visit in enc specialty: 06/29/2021     Recent Visits in This Encounter Department       Date Provider Department Visit Type Primary Dx    06/29/2021 [name], MD [place] [facility] Office Visit Allergic rhinitis, unspecified seasonality, unspecified trigger    10/06/2020 Hakim, Cleophas Dunker, MD [place] [facility] Office Visit Acquired hypothyroidism    05/16/2020 Hakim, Cleophas Dunker, MD [place] [facility] Office Visit Chest pain, unspecified type    02/25/2020 Hakim, Cleophas Dunker, MD [place] Office Visit Encounter for routine adult medical examination    01/25/2020 Rennis Chris, MD, PhD [facility] Office Visit Plantar fasciitis, bilateral           Population Health Visits  Recent [facility] Visits    None       Next appt in enc specialty: Visit date not found      Future Appointments 03/05/2022 - 03/04/2027        Date Visit Type Department Provider     03/19/2022  8:00 AM [place], [place]    Appointment Notes:     Auth# [id number] General eye exam *vsp*             05/01/2022  2:45 PM [facility] Marguerite Olea, MD    Appointment Notes:     tbc 1 year                     Per OV note on 06/29/21:  Reports recurrent "colds" in the winter season yearly. Rhinorrhea, throat scratching, some fatigue. Dry cough intermittently, no wheezing. No teary eyes. Denies fevers or SOB. Drinks hot tea when sxs occur, and she rests. Not sure if seasonal allergies. Takes Zyrtec nightly and uses flonase. Has tried Claritin and Allegra in the past but did not like how they made her feel.  [place] [employer] winds worsen sxs. Has not gotten a chance to replace HEPA filter. Uses  hypoallergenic mattress cover and pillow cover. Has cat.  A/P: Allergic rhinitis, unspecified seasonality, unspecified trigger  -     azelastine (ASTELIN) 0.1 % nasal spray; Spray 1 spray into each nostril 2 times daily. Use in each nostril as directed    Known allergic rhinitis, likely exacerbated by the environment and [employer]. Neg RAST panel in the past. Add Astelin spray in addition to nasal saline spray and Flonase. Continue Zyrtec. Recommend re-using HEPA filter.       LABS required:  (None)    Monitoring required:  (None)

## 2022-03-14 ENCOUNTER — Other Ambulatory Visit (INDEPENDENT_AMBULATORY_CARE_PROVIDER_SITE_OTHER): Payer: Self-pay | Admitting: Student in an Organized Health Care Education/Training Program

## 2022-03-14 DIAGNOSIS — E039 Hypothyroidism, unspecified: Secondary | ICD-10-CM

## 2022-03-15 MED ORDER — SYNTHROID 100 MCG OR TABS
100.0000 ug | ORAL_TABLET | ORAL | 1 refills | Status: DC
Start: 2022-03-15 — End: 2022-08-05

## 2022-03-15 MED ORDER — SYNTHROID 112 MCG OR TABS
112.0000 ug | ORAL_TABLET | ORAL | 1 refills | Status: DC
Start: 2022-03-15 — End: 2022-08-05

## 2022-03-15 NOTE — Telephone Encounter (Signed)
*The requested med passed all protocol parameters in the associated med info guide - Protocol details reviewed by pharmacist.       Hypothyroid Medication Refill Protocol    Last visit in enc specialty: 06/29/2021     Recent Visits in This Encounter Department       Date Provider Department Visit Type Primary Dx    06/29/2021 [name], MD [place] [facility] Office Visit Allergic rhinitis, unspecified seasonality, unspecified trigger    10/06/2020 Hakim, Cleophas Dunker, MD [place] [facility] Office Visit Acquired hypothyroidism    05/16/2020 Hakim, Cleophas Dunker, MD [place] [facility] Office Visit Chest pain, unspecified type    02/25/2020 Hakim, Cleophas Dunker, MD [place] [facility] Office Visit Encounter for routine adult medical examination    01/25/2020 Rennis Chris, MD, PhD [facility] Office Visit Plantar fasciitis, bilateral           Population Health Visits  Recent [facility] Visits    None       Next appt in enc specialty: Visit date not found      Future Appointments 03/15/2022 - 03/14/2027        Date Visit Type Department Provider     03/19/2022  8:00 AM [facility] [place] [facility] [place], [place]    Appointment Notes:     Auth# [id number] General eye exam *vsp*             05/01/2022  2:45 PM [facility] Marguerite Olea, MD    Appointment Notes:     tbc 1 year                         **If no recent dose changes, and last TSH (and T4 for Liothyronine only) normal/within date, no recent notes will be reviewed per Pharmacy Dept protocol**      LABS required:  (Q year TSH)  *If recent dose change: check TSH 2 months after starting new  dose.    Lab Results   Component Value Date    TSH 0.35 12/27/2021    TSH 0.70 03/23/2021    TSH 5.61 (H) 09/29/2020    FREET4 1.82 (H) 11/25/2014         Monitoring  required:  (Q year BP, HR, Wt)   *If pt is pregnant, DEFER TO MD*    (BP range: VWUJWJXB=14-782  NFAOZHYQM=57-84)  Blood Pressure   01/17/22 121/84   11/28/21 119/64   06/29/21 106/70       (HR range: 55-110)   Pulse Readings from Last 3 Encounters:   01/17/22 92   11/28/21 80   06/29/21 92       Wt Readings from Last 3 Encounters:   01/17/22 92.9 kg (204 lb 12.8 oz)   06/29/21 88 kg (194 lb)   10/06/20 88.5 kg (195 lb)         Last Digital Health Monitoring Vitals:        Last MyChart BP Values:        Last Pt Entered MyChart BP Values:               *The requested med passed all protocol parameters in the associated med info guide - Protocol details reviewed by pharmacist.  Hypothyroid Medication Refill Protocol    Last visit in enc specialty: 06/29/2021     Recent Visits in This Encounter Department       Date Provider Department Visit Type Primary Dx    06/29/2021 [name], MD [place] [facility] Office Visit Allergic rhinitis, unspecified seasonality, unspecified trigger    10/06/2020 Hakim, Cleophas Dunker, MD [facility] Office Visit Acquired hypothyroidism    05/16/2020 Hakim, Cleophas Dunker, MD [place] [facility] Office Visit Chest pain, unspecified type    02/25/2020 Hakim, Cleophas Dunker, MD [place] [facility] Office Visit Encounter for routine adult medical examination    01/25/2020 Rennis Chris, MD, PhD [place] [facility] Office Visit Plantar fasciitis, bilateral           Population Health Visits  Recent [facility] Visits    None       Next appt in enc specialty: Visit date not found      Future Appointments 03/15/2022 - 03/14/2027        Date Visit Type Department Provider     03/19/2022  8:00 AM [place], [place]    Appointment Notes:     Auth# [id number] General eye exam *vsp*             05/01/2022   2:45 PM [facility] Marguerite Olea, MD    Appointment Notes:     tbc 1 year                         **If no recent dose changes, and last TSH (and T4 for Liothyronine only) normal/within date, no recent notes will be reviewed per Pharmacy Dept protocol**      LABS required:  (Q year TSH)  *If recent dose change: check TSH 2 months after starting new  dose.    Lab Results   Component Value Date    TSH 0.35 12/27/2021    TSH 0.70 03/23/2021    TSH 5.61 (H) 09/29/2020    FREET4 1.82 (H) 11/25/2014         Monitoring required:  (Q year BP, HR, Wt)   *If pt is pregnant, DEFER TO MD*    (BP range: QXIHWTUU=82-800  LKJZPHXTA=56-97)  Blood Pressure   01/17/22 121/84   11/28/21 119/64   06/29/21 106/70       (HR range: 55-110)   Pulse Readings from Last 3 Encounters:   01/17/22 92   11/28/21 80   06/29/21 92       Wt Readings from Last 3 Encounters:   01/17/22 92.9 kg (204 lb 12.8 oz)   06/29/21 88 kg (194 lb)   10/06/20 88.5 kg (195 lb)         Last Digital Health Monitoring Vitals:        Last MyChart BP Values:        Last Pt Entered MyChart BP Values:

## 2022-03-19 ENCOUNTER — Ambulatory Visit (INDEPENDENT_AMBULATORY_CARE_PROVIDER_SITE_OTHER): Payer: Vision Other Private Insurance | Admitting: Optometrist

## 2022-03-19 DIAGNOSIS — H52223 Regular astigmatism, bilateral: Secondary | ICD-10-CM

## 2022-03-19 DIAGNOSIS — H40003 Preglaucoma, unspecified, bilateral: Secondary | ICD-10-CM

## 2022-03-19 DIAGNOSIS — H524 Presbyopia: Secondary | ICD-10-CM

## 2022-03-19 DIAGNOSIS — H5213 Myopia, bilateral: Secondary | ICD-10-CM

## 2022-03-19 NOTE — Progress Notes (Signed)
Ophthalmology Service  OPTOMETRY PROGRESS NOTE      HPI    Reasons for Visit: New patient eye exam. LEE 2933    56 year old Brittany [name] reported the following symptoms: Eyes feel dry.     Eye Medications: lid scrubs prn, refresh prn     POH:  (-) Eye Surgery:   (-) Ocular Trauma:   (-) Eye Disease:    [place]: Glaucoma- mother, father, sister. RD- father. Sister who is blind      Lab Results       Component                Value               Date                       A1C                      5.4                 02/25/2020              Last edited by Lawerance Bach on 03/19/2022  8:15 AM.          Assessment/Plan:   1. Myopia of both eyes  2. Regular astigmatism of both eyes  3. Presbyopia of both eyes  Released updated SpecRx.    4. Glaucoma suspect of both eyes  Low risk glc suspect - borderline IOP on applanation today.   [place] appears healthy  Strong FOHx  Baseline rnfl OCT today - patient will bring records of previous OCTs and HVFs from previous office.    Brittany [name]. Brittany [name], O.D. Lic# [id number]

## 2022-04-23 ENCOUNTER — Encounter (INDEPENDENT_AMBULATORY_CARE_PROVIDER_SITE_OTHER): Payer: Self-pay | Admitting: Dermatology

## 2022-04-23 DIAGNOSIS — D492 Neoplasm of unspecified behavior of bone, soft tissue, and skin: Secondary | ICD-10-CM

## 2022-04-23 DIAGNOSIS — L989 Disorder of the skin and subcutaneous tissue, unspecified: Secondary | ICD-10-CM

## 2022-04-23 DIAGNOSIS — Z1283 Encounter for screening for malignant neoplasm of skin: Secondary | ICD-10-CM

## 2022-04-25 ENCOUNTER — Ambulatory Visit
Admission: RE | Admit: 2022-04-25 | Discharge: 2022-04-25 | Disposition: A | Discharge status: Home / Self Care | Payer: BLUE CROSS/BLUE SHIELD | Attending: Obstetrics & Gynecology | Admitting: Obstetrics & Gynecology

## 2022-04-25 DIAGNOSIS — Z1231 Encounter for screening mammogram for malignant neoplasm of breast: Secondary | ICD-10-CM

## 2022-04-25 DIAGNOSIS — Z01419 Encounter for gynecological examination (general) (routine) without abnormal findings: Secondary | ICD-10-CM | POA: Insufficient documentation

## 2022-04-25 DIAGNOSIS — R92333 Mammographic heterogeneous density, bilateral breasts: Secondary | ICD-10-CM

## 2022-05-01 ENCOUNTER — Ambulatory Visit (INDEPENDENT_AMBULATORY_CARE_PROVIDER_SITE_OTHER): Payer: BLUE CROSS/BLUE SHIELD | Admitting: Dermatology

## 2022-05-01 ENCOUNTER — Encounter (INDEPENDENT_AMBULATORY_CARE_PROVIDER_SITE_OTHER): Payer: Self-pay | Admitting: Dermatology

## 2022-05-01 DIAGNOSIS — L738 Other specified follicular disorders: Secondary | ICD-10-CM

## 2022-05-01 DIAGNOSIS — D229 Melanocytic nevi, unspecified: Secondary | ICD-10-CM

## 2022-05-01 DIAGNOSIS — L814 Other melanin hyperpigmentation: Secondary | ICD-10-CM

## 2022-05-01 DIAGNOSIS — L720 Epidermal cyst: Secondary | ICD-10-CM

## 2022-05-01 DIAGNOSIS — L821 Other seborrheic keratosis: Secondary | ICD-10-CM

## 2022-05-01 DIAGNOSIS — D239 Other benign neoplasm of skin, unspecified: Secondary | ICD-10-CM

## 2022-05-01 DIAGNOSIS — D1801 Hemangioma of skin and subcutaneous tissue: Secondary | ICD-10-CM

## 2022-05-01 DIAGNOSIS — L578 Other skin changes due to chronic exposure to nonionizing radiation: Secondary | ICD-10-CM

## 2022-05-01 NOTE — Progress Notes (Signed)
[place]   [place], [place] [address]  [phone]     Primary MD: [name]  Consult Requested By: [name]    HISTORY:  Brittany [name] is a 56 year old female with no personal history of skin cancer here for TBSE due to sun damaged skin.     Last office visit 09/29/20 with me: Benign nature of milia, purpura, sebhype, DF reassured    Lesion of concern today:   1. Patient brings attention to a acne-like lesion on the nose that seems to be persistent. Says mother has had skin cancer surgeries in that area. Otherwise denies other new itching, bleeding, painful, growing, ulcerating, or concerning lesions     Works in Electrical engineer for [employer]: 1 major sunburn with peeling as a child   Sun Protection:  intermittent  Melanoma hx: negative   NMSC hx: negative   Family Hx of melanoma: negative   Family hx of skin disease: positive - mother had 3 NMSC (she did not use sunscreen), father with 1 NMSC    PMH: reviewed    Medications: reviewed    Allergies: Vicodin [hydrocodone-acetaminophen]    REVIEW OF SYSTEMS:  DERMATOLOGIC: no other skin complaints.  Constitutional: feels well, no concerns     PHYSICAL EXAM:  General Appearance: within normal limits  Neuro: Alert and oriented x 3  Psych: Mood and affect within normal limits  Eyes: Inspection of lids, sclera, and conjunctiva within normal limits   Areas skin examined included: scalp, forehead, eyebrows, cheeks, nose, lips, chin, ears, sclera, conjunctiva, eyelids, neck, RUE, LUE, hands, chest, abdomen, back, RLE, LLE, feet, buttock, fingernails and toenails    Pertinent findings below:  -nose clear today  -white subepidermal cystic 1-3 mm papules on the face   -yellow papules with central dell on the face  -pink brown papule with + dimple sign on the right upper arm, lower extremities   -scattered even-bordered, even-pigmented macules and papules on the head, trunk and extremities  -well demarcated,  brown, stuck on appearing plaques on head, trunk and extremities, lesion of concern   -feathery brown macules on the face, forearms and shoulders  -scattered 2-4 mm cherry red macules  -dyschromia, thinning, loss of elasticity, and wrinkling of the skin in sun exposed areas    ASSESSMENT AND TREATMENT PLAN:    Milia of face  Sebaceous hyperplasia of face  Dermatofibromas of extremities  Multiple Benign Nevi  Seborrheic Keratoses  Lentigines  Cherry angioma  - benign lesions reassured  - Discussed option of cosmetic treatment with Dr. Olevia Bowens or Dr. Rande Brunt; $250 out of pocket consult fee (which is applied to cost of procedure).     Sun damaged skin  - educational information about sun protection and skin cancers provided in handout, broad spectrum SPF 30 or higher  - RTC if notices any new or concerning growths  - Diagnoses, natural course, potential treatments and risks were discussed with the patient. Shared decision making. Questions answered.    RTC 12 months TBSE, or sooner as needed     Marguerite Olea, MD, PhD    ------------------------    Carlynn Purl Attestation  The notes I am recording reflect only actions made by and judgments taken by this provider, Dr. Jerene Dilling, for whom I am scribing today. I have performed no independent clinical work.    Rosalyn Gess    ____________________________________________________________________    Provider Attestation for Scribed Note  As the attending provider, I agree with the scribed content.  Any changes or edits are noted in the text above.     Bernadette Gores

## 2022-05-01 NOTE — Patient Instructions (Signed)
[facility]    SUN PROTECTION      Quick info about SUNSCREENS:      Sunscreens with zinc oxide or titanium dioxide provide broader spectrum coverage against all wavelengths of UV light.  These mineral blockers are my preference for sunscreen.  I do not recommend spray sunscreens.      For acne prone skin:   Use a facial moisturizer with an SPF that says NON-COMEDOGENIC and OIL-FREE on it.        Examples:  ELTA MD UV Clear SPF46 facial moisturizer with sunscreen (can purchase online or at front desk of [place]/[facility] office)  La Roche-Posay Anthelios Clear Skin Dry Touch Sunscreen SPF 60   Aveeno Positively Radiant Daily Facial Moisturizer With Broad Spectrum SPF 30  Blue Lizard SPF 30  Coppertone Pure and Simple Baby SPF50   Neutrogena Sheer Zinc Sunscreen Face Lotion - SPF 50  Neutrogena Clear Face Sunscreen Lotion - SPF 55    For sensitive skin (best if the product has only titanium dioxide or zinc oxide in it):   Solbar SPF30 gel  Vanicream sunscreen for sensitive skin SPF50+    Sunscreen needs to be reapplied every 1.5 to 2 hours when you are outside.     Be aware of reflective light off water and snow.    Consider wearing sun protective clothing and hats (UPF instead of SPF), this provides more even/uniform coverage than sunscreen.  There are many companies now selling this clothing, I buy my clothing from a company called [employer].     Remember to avoid sun during the peak hours of 10 am - 2 pm.  Wear a wide brimmed hat when outside.  Wear sunglasses that will cover your entire eye and upper cheeks if possible.      Please see below for a list of sunscreen sprays that contain unsafe levels of benzene that we recommend avoiding:      More in-depth information:    https://www.aad.org/public    https://www.aad.org/public/spot-skin-cancer    https://www.melanoma.org    Why protect against the sun?  In the past, sun exposure was thought to be a healthy benefit of outdoor activity. However, studies have shown  many unhealthy effects of sun exposure, such as early aging of the skin and skin cancer.    What kind of damage does sun exposure cause?  Part of the sun's energy that reaches earth is composed of rays of invisible ultraviolet (UV) light. When ultraviolet light rays (UVA and UVB) enter the skin, they damage skin cells, causing visible and invisible injuries.  Sunburn is a visible type of damage, which appears just a few hours after sun exposure. In many people, this type of damage also causes tanning. Freckles, which occur in people with fair skin, are usually due to sun exposure. Freckles are nearly always a sign that sun damage has occurred, and therefore show the need for sun protection.  Ultraviolet light rays also cause invisible damage to skin cells. Some of the injury is repaired, but some of the cell damage adds up year after year. After 20 to 30 years or more, the built-up damage appears as wrinkles, age spots, and even skin cancer. Although window glass blocks UVB light, UVA rays are able to penetrate through glass.    How can I protect myself from excessive sun exposure?   Avoidance. Stay away from the sun in the middle of the day.  Sun exposure is more intense closer to the equator, in the   mountains, and in the summer. The sun's damaging effects are increased by reflection from water, white sand, and snow. Avoid long periods of direct sun exposure. Sit or play in the shade, especially when your shadow is shorter than you are tall.   Use sun protective clothing.  Cover up with light colored clothing when outdoors, including a hat to protect the scalp and face. In addition to filtering out the sun, tightly woven clothing reflects heat and helps keep you feeling cool. Sunglasses that block ultraviolet rays protect the eyes and eyelids. Multiple retailers now sell sun protective clothing for adults and children.  Rash guards should be worn during outdoor swimming activity.   Block sun damage by applying a  broad-spectrum UVA and UVB sunscreen with an SPF of 30 or higher and reapply approximately every two hours, even on cloudy days. If swimming or participating in intense physical activity, sunscreen may need to be applied more often.  How do I select the right sunscreen?    Choose a sunscreen with a SPF 30 or higher. The protective ability of sunscreen is rated by Sun Protection Factor (SPF) -- the higher the SPF, the stronger the protection. Spread it evenly over all uncovered skin, including ears and lips, but avoid the eyelids. Apply sunscreen about 30 minutes before sun exposure. Re-apply after swimming or excessive sweating.    Most importantly, choose a sunscreen that you will wear.  New sunscreens are added to the marketplace frequently, and selection of a particular brand is often a matter of personal preference.       What about the controversies regarding sunscreens?    Hats, clothing, and shade are the most reliable forms of sun protection.  Few people use enough sunscreen to benefit from the SPF protection listed on the label. Most practitioners in our group continue to recommend and utilize many sunscreen products, including chemical and physical sunscreens.  However, studies show that people typically use about a quarter of the recommended amount.     There has been much news coverage recently about the possible dangers of sunscreens. The Environmental Working Group publishes an annual sunscreen report.  The 2010 report, which received a considerable amount of media attention, cited concerns about a form of vitamin A called retinyl palmitate, found in many sunscreens. Safety testing is far from conclusive, and the FDA continues to investigate whether this compound may accelerate skin damage and elevate skin cancer risk when applied to skin exposed to sunlight.      Many have also raised concerns about chemical sunscreens, which contain substances such as oxybenzone, avobenzone, benzophenone, and parsol  1789. Some believe that these may be converted into hormones when absorbed through the skin. Some prefer physical agents such as zinc oxide or titanium dioxide, however efforts to make these agents more sheer and cosmetically acceptable have resulted in formulations that may contain tiny particles known as "nanoparticles."  Concerns also exist about their absorption.  Most of the above concerns are theoretical; however, if you are highly concerned about these issues, you can visit the Environmental Working Group's website devoted to sunscreen safety: www.ewg.org/2010 sunscreen.   Most dermatologists remain convinced that the risks of unprotected sun exposure far outweigh the above mentioned theoretical risks of sunscreens. The type of sunprotection used remains an individual choice for each parent.    What about vitamin D?    Vitamin D is essential for many processes in the body, and is important for bone growth in children.    Over the last few years, many studies have suggested an association between low vitamin D levels and increased risk of certain types of cancers, neurologic disease, autoimmune disease and cardiovascular disease.  While dermatologists agree that the sun is certainly a source of vitamin D, we are also uniquely aware it is also a source of harmful ultraviolet radiation resulting in thousands of skin cancers each year.  The official recommendation of the American Academy of Dermatology (AAD) is that vitamin D should be obtained through dietary sources and supplementation rather than from sunlight (ultraviolet radiation).    MOLES AND MELANOMAS      Moles (nevi) are tan or brown, raised or flat areas of the skin which have an increased number of melanocytes. Melanocytes are the cells in our body which make pigment and account for skin color.    Some moles are present at birth (congenital nevi), while others come up later in life (acquired nevi).  The sun can stimulate the body to make more moles.   Sunburns are not the only thing that triggers more moles.  Chronic sun exposure can do it too.     Malignant melanoma is a type of skin cancer that can be deadly if it spreads throughout the body. The incidence of melanoma in the [place] is growing rapidly.  Melanoma usually grows near the surface of the skin for a period of time, and then begins to grow deeper into the skin. Once it grows deeper into the skin, the risk of spread to other organs greatly increases. Therefore, early detection and removal of a malignant melanoma can result in a complete cure; removal after the tumor has spread may not be effective.    Melanoma can often be suspected by its appearance. The ABCDE's of melanoma are:    Asymmetry: Asymmetry means if you draw a line through the mole, the two halves do not match in color, size, shape, or surface texture. Asymmetry can be a result of rapid enlargement of a mole, the development of a raised area on a previously flat lesion, scaling, ulceration, bleeding or scabbing within the mole.    Border: The border of a melanoma often blends into the normal skin and does not sharply delineate the mole from normal skin.    Color: Different colors within a mole, or the development of dark black, blue, or red areas in a preexisting mole.    Diameter: Size greater than 0.6cm (1/4 of an inch, or the size of a pencil eraser). This is only a guideline, and many normal moles may be this large or even a bit larger.    Evolving: Any change -- in size, shape, color, elevation, or another trait, or any new symptom such as bleeding, itching or crusting    Melanomas should be considered when a mole suddenly appears or changes. Itching, burning, or pain in a pigmented lesion should cause suspicion, but most patients with early melanoma have no skin discomfort whatsoever.  The appearance of a mole remains the most reliable method for identifying a malignant melanoma. Suspicious-looking moles may be removed for  microscopic examination.    Melanoma can occur anywhere on the skin, including areas that are difficult for self-examination. Many melanomas are first noticed by other family members.      Occasionally, melanomas appear as rapidly growing, blue-black, dome-shaped bumps within a previous mole or previous area of normal skin    Dysplastic moles are moles that fit the ABCDE rules of melanoma, but   are not melanomas when examined under the microscope.  They may indicate an increased risk of melanoma in that person. If there is a family history of melanoma, most experts agree that the person is at an increased risk for developing a melanoma.  Experts still do not agree on what dysplastic moles mean in patients without a personal or family history of melanoma.  Dysplastic moles are usually larger than common moles and may have different colors within them with irregular borders. The appearance can be very similar to a melanoma. Biopsies of dysplastic moles may show abnormalities which are different from a regular mole.      You should take steps to decrease you risk of melanoma.   First, avoid the sun and protect your skin when it is exposed to the sun.  People who have lived near the equator, people who have intermittent exposures to large amounts of sun, and people who have had sunburns in childhood or adolescence have an increased risk for melanoma. Sun sense and sun protection are key to preventing melanoma.    Secondly, moles should be looked at regularly.  Melanomas can be diagnosed early by self-examinations at home.  By looking for the ABCDE's of moles once a month, you should notice if any changes have occurred and if so, you should make an appointment earlier than your next check-up in order to be examined.  On the other hand, don't check your moles more than once a month or you might not notice a change.

## 2022-05-07 ENCOUNTER — Other Ambulatory Visit (INDEPENDENT_AMBULATORY_CARE_PROVIDER_SITE_OTHER): Payer: BLUE CROSS/BLUE SHIELD

## 2022-05-07 DIAGNOSIS — Z1159 Encounter for screening for other viral diseases: Secondary | ICD-10-CM

## 2022-05-07 NOTE — Interdisciplinary (Signed)
Verified patient with two identifiers.   Nasal specimen obtained from both nostrils from patient per order and submitted to lab for processing.   Patient tolerated well.   results will be sent via MyChart or MyStudentChart to patient.

## 2022-05-08 LAB — COVID-19 CORONAVIRUS DETECTION ASSAY AT ~~LOC~~ LAB: COVID-19 Coronavirus Result: NOT DETECTED

## 2022-05-31 ENCOUNTER — Encounter (HOSPITAL_BASED_OUTPATIENT_CLINIC_OR_DEPARTMENT_OTHER): Payer: Self-pay | Admitting: Physical Medicine and Rehab

## 2022-06-12 ENCOUNTER — Other Ambulatory Visit (INDEPENDENT_AMBULATORY_CARE_PROVIDER_SITE_OTHER): Payer: BLUE CROSS/BLUE SHIELD

## 2022-06-12 DIAGNOSIS — Z1159 Encounter for screening for other viral diseases: Secondary | ICD-10-CM

## 2022-06-12 NOTE — Interdisciplinary (Signed)
Verified patient with two identifiers.   Nasal specimen obtained from both nostrils from patient per order and submitted to lab for processing.   Patient tolerated well.   results will be sent via MyChart or MyStudentChart to patient.

## 2022-06-13 LAB — COVID-19 CORONAVIRUS DETECTION ASSAY AT ~~LOC~~ LAB: COVID-19 Coronavirus Result: NOT DETECTED

## 2022-06-17 ENCOUNTER — Other Ambulatory Visit (INDEPENDENT_AMBULATORY_CARE_PROVIDER_SITE_OTHER): Payer: Self-pay | Admitting: Student in an Organized Health Care Education/Training Program

## 2022-06-17 DIAGNOSIS — M722 Plantar fascial fibromatosis: Secondary | ICD-10-CM

## 2022-06-18 NOTE — Telephone Encounter (Signed)
[facility]     diclofenac  is not currently included in the Pharmacy Refill Clinic protocols. Re-routing to the responsible staff for processing.  Thank you

## 2022-06-18 NOTE — Telephone Encounter (Signed)
Established with:  Last OV with PCP:  Next OV with Dept: Brittany [name]   Visit date not found  08/02/2022     Medication requested:   Requested Prescriptions     Pending Prescriptions Disp Refills    diclofenac (VOLTAREN) 1 % gel 1 each 3     Sig: Apply 4 g topically 4 times daily as needed (pain).       Last Filled Date 03/29/21   Quantity Last Filled  1 each   Refill 3   Send to:    [id number] - , [place] - [address]  [address]  [place] [address]  Phone: [phone] Fax: [phone] Alternate Fax: [facility]  [address]  [place] [address]  Phone: [phone] Fax: [phone]      Current Medication(s):  Current Outpatient Medications   Medication Sig Dispense Refill    acetaminophen (TYLENOL) 325 MG tablet Take 2 tablets (650 mg) by mouth every 4 hours as needed for Mild Pain (Pain Score 1-3).      azelastine (ASTELIN) 0.1 % nasal spray Spray 1 spray into each nostril 2 times daily. Use in each nostril as directed 90 mL 1    cetirizine (ZYRTEC) 10 MG tablet Take 1 tablet (10 mg) by mouth daily. 90 tablet 3    cyclobenzaprine (FLEXERIL) 5 MG tablet Take 1 tablet (5 mg) by mouth 3 times daily as needed for Muscle Spasms. 30 tablet 0    desvenlafaxine (PRISTIQ) 50 MG TB24 Take 1 tablet (50 mg) by mouth daily. 90 tablet 4    desvenlafaxine Succinate ER (PRISTIQ) 25 MG TB24 Take 1 tablet (25 mg) by mouth daily. 90 tablet 4    diclofenac (VOLTAREN) 1 % gel Apply 4 g topically 4 times daily as needed (pain). 1 each 3    fluticasone propionate (FLONASE) 50 MCG/ACT nasal spray Spray 1 spray into each nostril 2 times daily. 1 bottle 5    metroNIDAZOLE (METROGEL) 1 % gel Apply 1 Application topically daily. Use a small amount as directed 1 each 11    rosuvastatin (CRESTOR) 10 MG tablet Take 1 tablet (10 mg) by mouth daily. 90 tablet 3    SYNTHROID 100 MCG tablet Take 1 tablet (100 mcg) by mouth every other day. Take before  breakfast, alternating with 112 mcg every other day. 45 tablet 1    SYNTHROID 112 MCG tablet Take 1 tablet (112 mcg) by mouth every other day. Take before breakfast, alternating with 100 mcg every other day. 45 tablet 1    tretinoin (RETIN-A) 0.025 % cream Apply a thin layer at bedtime as directed 1 Tube 3     No current facility-administered medications for this visit.       Patient information: Allergies   Allergen Reactions    Vicodin [Hydrocodone-Acetaminophen] Hallucinations        Health Maintenance Due   Topic Date Due    PHQ9 Depression Monitoring doc flowsheet  10/26/2021      Last BP:  Blood Pressure   01/17/22 121/84   11/28/21 119/64   06/29/21 106/70      Last labs:  Results for orders placed or performed in visit on 06/12/22   SARS-CoV-2 (COVID-19) Nucleic Acid Detection Test   Result Value Ref Range    COVID-19 Source NASAL     COVID-19 Coronavirus Result Not Detected Not Detected     Lab Results  Component Value Date    CHOL 162 12/27/2021    HDL 67 12/27/2021    LDLCALC 80 12/27/2021    TRIG 74 12/27/2021    TSH 0.35 12/27/2021    A1C 5.4 02/25/2020

## 2022-06-19 MED ORDER — DICLOFENAC SODIUM 1 % EX GEL
4.00 g | Freq: Four times a day (QID) | CUTANEOUS | 3 refills | Status: AC | PRN
Start: 2022-06-19 — End: ?

## 2022-06-23 ENCOUNTER — Encounter (INDEPENDENT_AMBULATORY_CARE_PROVIDER_SITE_OTHER): Payer: Self-pay | Admitting: Student in an Organized Health Care Education/Training Program

## 2022-06-23 DIAGNOSIS — L600 Ingrowing nail: Secondary | ICD-10-CM

## 2022-06-24 NOTE — Telephone Encounter (Signed)
From: [name]  To: [name], MD  Sent: 06/23/2022 3:48 PM PST  Subject: Referral to Podiatry    Hello Dr. Charlynn Court,  Please provide a referral to podiatry. I may have the start of an ingrown toenail on my right big toe, and would also like a general consult from a podiatrist. Thank you.   Brittany [name]

## 2022-06-24 NOTE — Telephone Encounter (Signed)
Routing to Dr. Charlynn Court for review and advise    Pended referral, please sign if appropriate.

## 2022-06-26 ENCOUNTER — Ambulatory Visit (HOSPITAL_BASED_OUTPATIENT_CLINIC_OR_DEPARTMENT_OTHER)
Admit: 2022-06-26 | Discharge: 2022-06-26 | Disposition: A | Discharge status: Home Health | Payer: BLUE CROSS/BLUE SHIELD

## 2022-06-26 ENCOUNTER — Encounter (HOSPITAL_COMMUNITY): Payer: Self-pay

## 2022-06-26 ENCOUNTER — Ambulatory Visit: Payer: BLUE CROSS/BLUE SHIELD | Attending: Physical Medicine and Rehab | Admitting: Physical Medicine and Rehab

## 2022-06-26 ENCOUNTER — Encounter (HOSPITAL_BASED_OUTPATIENT_CLINIC_OR_DEPARTMENT_OTHER): Payer: Self-pay | Admitting: Physical Medicine and Rehab

## 2022-06-26 DIAGNOSIS — M25569 Pain in unspecified knee: Secondary | ICD-10-CM | POA: Insufficient documentation

## 2022-06-26 DIAGNOSIS — M1711 Unilateral primary osteoarthritis, right knee: Secondary | ICD-10-CM | POA: Insufficient documentation

## 2022-06-26 DIAGNOSIS — G8929 Other chronic pain: Secondary | ICD-10-CM

## 2022-06-26 DIAGNOSIS — M25512 Pain in left shoulder: Secondary | ICD-10-CM | POA: Insufficient documentation

## 2022-06-26 NOTE — Progress Notes (Signed)
Referring provider:  Self, Referred    CC:  Right knee pain    Brittany [name] is a 57 year old female who was requesting an evaluation for pain.  This has been a problem since 2018 and has no current medical work up.  She was seen in the past by Dr. Lowella Petties and notes dated 02/05/2018 were reviewed.  Plan at that time was conservative care. She denies any injury, no inciting event.  She does use Voltaren gel.  She does not use a knee brace.  She has had 0 steroid injections into her right knee in the last 12 months.  She has never had right knee surgery.  She is independent ADLs.  She has not able to walk down stairs due to right knee pain    She comes today with a second problem which is chronic left shoulder pain.  This has been a problem since she fell in Oct. 2023 and landed on her shoulder.  She reports a slight stiffness that has not resolved with rest, ice, heat.  This problem has no medical work up.    Location:  points to the right knee lateral; points to the left lateral shoulder  Radiation:  None  Severity:   (Pain Score: 0)  Context/Modifying factors:  Worse with walking downstairs, better with nothing so far   Associated signs/symptoms:  claims right knee clicking, popping, locking, instability, give-way weakness      History:  PMedHx:  Past Medical History:   Diagnosis Date    Hypothyroidism     h/o hyperthyroidism s/p radioactive iodine    Infectious mononucleosis 08/19/2006    Insomnia     Menorrhagia      PSxHx:  Past Surgical History:   Procedure Laterality Date    left wrist tendon repair  05/20/1996    CHOLECYSTECTOMY, LAP  05/20/1993    PB REMOVE TONSILS/ADENOIDS,<12 Y/O  05/20/1973    CHOLECYSTECTOMY  1997    PB MYOMECTOMY 1-4 MYOMAS 250 GM/< VAGINAL APPR      wisdom teeth       Social History     Socioeconomic History    Marital status: Divorced   Occupational History    Occupation: Office manager   Tobacco Use    Smoking status: Passive Smoke Exposure - Never Smoker    Smokeless tobacco:  Never    Tobacco comments:     2nd hand smoke   Substance and Sexual Activity    Alcohol use: Yes     Types: 2 Glasses of wine per week     Comment: 1 bottle wine /week    Drug use: No    Sexual activity: Not Currently     Partners: Male     Birth control/protection: Condom   Social Activities of Daily Living Present    Military Service No    Blood Transfusions No    Caffeine Concern No    Occupational Exposure No    Hobby Hazards No    Sleep Concern Yes    Stress Concern Yes    Weight Concern No    Special Diet Yes     Comment: Gluten sensitivity, mother and sister are Celiac    Back Care Yes    Exercises Regularly Yes    Bike Helmet Use Yes    Seat Belt Use Yes    Performs Self-Exams No   Social History Narrative    2019- Divorced and no kids. [place] and [employer] and communication.  Lives on own and social theatre/culture. Yoga and swimming/hiking.        2018-08-29: Sister died of endometrial cancer at 34 yo this year. Very precipitous. Mother still alive in [place]-assisted living.        She is youngest-passing has caused much pain        08/29/19: Hikes occasionally. Yoga on her own. Single.       Current Outpatient Medications on File Prior to Visit   Medication Sig Dispense Refill    acetaminophen (TYLENOL) 325 MG tablet Take 2 tablets (650 mg) by mouth every 4 hours as needed for Mild Pain (Pain Score 1-3).      azelastine (ASTELIN) 0.1 % nasal spray Spray 1 spray into each nostril 2 times daily. Use in each nostril as directed 90 mL 1    cetirizine (ZYRTEC) 10 MG tablet Take 1 tablet (10 mg) by mouth daily. 90 tablet 3    cyclobenzaprine (FLEXERIL) 5 MG tablet Take 1 tablet (5 mg) by mouth 3 times daily as needed for Muscle Spasms. 30 tablet 0    desvenlafaxine (PRISTIQ) 50 MG TB24 Take 1 tablet (50 mg) by mouth daily. 90 tablet 4    desvenlafaxine Succinate ER (PRISTIQ) 25 MG TB24 Take 1 tablet (25 mg) by mouth daily. 90 tablet 4    diclofenac (VOLTAREN) 1 % gel Apply 4 g topically 4 times daily as needed (pain). 1  each 3    fluticasone propionate (FLONASE) 50 MCG/ACT nasal spray Spray 1 spray into each nostril 2 times daily. 1 bottle 5    metroNIDAZOLE (METROGEL) 1 % gel Apply 1 Application topically daily. Use a small amount as directed 1 each 11    rosuvastatin (CRESTOR) 10 MG tablet Take 1 tablet (10 mg) by mouth daily. 90 tablet 3    SYNTHROID 100 MCG tablet Take 1 tablet (100 mcg) by mouth every other day. Take before breakfast, alternating with 112 mcg every other day. 45 tablet 1    SYNTHROID 112 MCG tablet Take 1 tablet (112 mcg) by mouth every other day. Take before breakfast, alternating with 100 mcg every other day. 45 tablet 1    tretinoin (RETIN-A) 0.025 % cream Apply a thin layer at bedtime as directed 1 Tube 3     No current facility-administered medications on file prior to visit.     Allergies   Allergen Reactions    Vicodin [Hydrocodone-Acetaminophen] Hallucinations     Family History   Problem Relation Name Age of Onset    Breast Cancer Mother Edelyn Heidel - deceased 68    Cancer Mother Yalda Herd - deceased         Stage 4 breast cancer    Other Sister Jeniffer Culliver - deceased         Uterine fibroids/hysterectomy    Crohn's Disease Sister Junie Avilla - deceased     Cancer Sister Messiah Rovira - deceased         Endometrial cancer    Breast Cancer Sister Toniann Ket 2038-08-29    Hypertension Sister Toniann Ket         Hypertension / obese    Cancer Sister          Endometrial cancer at 23    Hypertension Father Mr. ARADHANA GIN - deceased     Hypertension Sister      Cholesterol/Lipid Disorder Sister      Diabetes Sister Hensley Aziz         Adult onset  Hypertension Sister Maila Dukes     Thyroid Sister Jaunita Mikels     Ovarian Cancer Neg Hx          but sister w/ovarian cysts age 45         ROS:  Constitutional:  No Fever, no wt loss, no chills, no night sweats    Physical exam  Constitutional: Complexion is normal.  Appears to be healthy.  Alert and oriented x 3.     Head: atraumatic,  normocephalic    Cardiovascular: No swelling in the extremities, dorsalis pedis, popliteal  pulses palpable bilaterally    Psychiatric: Agitation appears to be absent. Anxiety appears to be absent. Depression appears to be absent. Judgment is intact and appropriate. Orientation to person, place and time is intact. Recent memory is intact. Remote memory is intact.    Respiratory: Breath sounds are normal. Breathing is not labored.    Skin: No scars or rashes in the extremities. No erythema or calor around the knee    Lymph: no lymphedema       Musculoskeletal System  - Knee  Gait and station:  Ambulated with out assistive device.  Equal WB bilaterally   Range of motion right knee: Full range knee flexion/extension with pain, no crepitation, no contracture   Stability:  No dislocation, no subluxation,  no deformity of knee, no laxity of right knee joint  Inspection/palpation -  No misalignment or asymmetry, no crepitation, no defects, tenderness lateral joint line, no effusion, able to fully extend knee without pain  Muscle strength and tone - No flaccid limbs, no cog wheel rigidity, no spasticity, no atrophy no dystonic abnormal movements  Squatting cannot be performed.  Anterior/posterior drawer negative  MCL/LCL Strain negative  McMurray's negative  Patellar grind negative     Musculoskeletal System  - Shoulder  Active ROM: mild limited left shoulder flexion/extension/IR/ER/Abduction/Adduction  Passive ROM: full flexion/extension/IR/ER/Abduction/Adduction  with pain in overhead motions, no crepitation, no contracture   Stability:  No dislocation, no subluxation,  no deformity of AC joint, no laxity of left shoulder joint  Inspection/palpation -  No misalignment or asymmetry, + crepitation, no defects, tenderness  along the lateral shoulder   Muscle strength and tone - No flaccid limbs, no cog wheel rigidity, no spasticity, no atrophy no dystonic abnormal movements    Spurling's negative bilaterally    Left   Shoulder:   Neers/ Hawkins/Cross Arm negative  Yergason's negative  AC Joint negative  Biceps Tendon negative  Lift off negative   Empty can negative  abnormal scapular motion  No scapular winging    Imaging:  X-ray today  "IMPRESSION:  Tricompartmental osteoarthrosis of the right knee, principally involving the lateral femorotibial compartment, progressed from prior radiographs and now with severe changes."    "IMPRESSION:  No acute osseous abnormality.      Slight superior migration of the humeral head, suggesting underlying partial-thickness rotator cuff tendon tearing. Enthesopathic sclerosis of the greater tuberosity of the proximal humerus."    MRI right knee 01/23/2018 "IMPRESSION:  Longitudinal horizontal tearing of a diminutive body of the lateral meniscus extending towards the junctions of the posterior and anterior horns, with a very small superior the extrusion meniscal flap.     Tricompartmental osteoarthrosis with large areas of full-thickness cartilage loss of the lateral tibial plateau posteriorly. Additional high-grade cartilage fissuring of the posterior aspect of the lateral femoral condyle and central femoral trochlea.     Diminutive appearance of the medial meniscal body suggesting  inner margin tearing.     Remote low-grade injury of fibular collateral ligament.     Popliteus tendinosis."    Assesment:    Brittany [name] is a 57 year old female who presents for      Diagnosis/Impression     1.  Right knee pain  2.  Right knee osteoarthritis severe in the lateral compartment   3.  Left shoulder pain  4 Rotator cuff tenonopathy         Recommendations:    1. After discussion with the patient and consideration of all theraputic options, the patient decided to pursue joint replacement for her knee.  There are no surgical indications for the left shoulder at this time.  I reviewed and summarized the outside records.  2. We discussed home exercises, and I am recommending doing the same amount of  physical activity every day without days of doing more and days of rest.  I am ordering PT to improve her ROM at the shoulder  3. I personally reviewed the above images which show severe loss of lateral compartment joint space.  No current indication for MRI of the right knee or left shoulder  4. We discussed the risk/benefit/alternative of a left knee intra-articular steroid injection.  She declined for now, but would consider it the future.  5. For bracing options we discussed a lateral unloader brace   6. We discussed the risk/benefit/alternative of a left subacromial steroid injection.  She declined for now, but would consider it the future.  7.  For medication options I am recommending Voltaren gel 4 times a day  8 The patient can follow up with this clinic as needed for non-surgical care

## 2022-06-26 NOTE — Patient Instructions (Addendum)
Nice to meet you today.   I am recommending doing the same amount of physical activity every day without days of doing more and days of rest.  I am recommending Voltaren gel 4 times a day    Your provider has ordered you Durable Medical Equipment.  You will need to contact [facility] and schedule an appointment with them to be fit with your new brace or device.  They have many locations across the county and you may choose the location that is most convenient for you.      You can read more about dietary supplements and arthritis by googling this citation:  Loma Sender, Eyles JP, Francie Massing, Hunter DJ. Dietary supplements for treating osteoarthritis: a systematic review and meta-analysis. Br J Sports Med. 2018 Feb;52(3):167-175. doi: 10.1136/bjsports-2016-097333. Epub 2017 Oct 10. PMID: 38101751.  If you do want to start any of these products, please consult with your pharmacist or primary care physician to discuss drug interactions with important medication you may be taking.  Also please note that the vitamin industry is unregulated, and therefore products bought (especially from [employer]) may not contain the ingredients listed.

## 2022-07-01 NOTE — Telephone Encounter (Signed)
From: [name]  To: [place], [place]  Sent: 06/26/2022 12:01 PM PST  Subject: Left shoulder    Hello Dr. Fransisca Kaufmann,  Pleasure to meet you today. Thank you for the referral to PT. I read the results of my shoulder x-ray. The last sentence in the Findings (copied below) about demineralized bones concerns me. Should I get a bone density scan?     FINDINGS:   No acute fracture. Acromioclavicular joint is congruent. Slight superior migration of the humeral head relative the glenoid with mild narrowing of the acromiohumeral distance, which may reflect partial-thickness rotator cuff tendon tearing. Accompanying enthesopathic sclerosis of the greater tuberosity. Bones appear demineralized.    Thank you,  Brittany [name]

## 2022-07-09 ENCOUNTER — Ambulatory Visit: Payer: BLUE CROSS/BLUE SHIELD | Admitting: Rehabilitative and Restorative Service Providers"

## 2022-07-18 ENCOUNTER — Telehealth (INDEPENDENT_AMBULATORY_CARE_PROVIDER_SITE_OTHER): Payer: Self-pay | Admitting: Optometrist

## 2022-07-18 NOTE — Telephone Encounter (Signed)
Received call from patient wanting to schedule VF and schedule with glaucoma.   DOS 03/19/22 : 4. Glaucoma suspect of both eyes  Low risk glc suspect - borderline IOP on applanation today.   Brittany [name] appears healthy  Strong FOHx  Baseline rnfl OCT today - patient will bring records of previous OCTs and HVFs from previous office.    Does pt also need a vf ?  Pls advise. ty

## 2022-07-20 ENCOUNTER — Other Ambulatory Visit (INDEPENDENT_AMBULATORY_CARE_PROVIDER_SITE_OTHER): Payer: Self-pay | Admitting: Student in an Organized Health Care Education/Training Program

## 2022-07-20 DIAGNOSIS — E785 Hyperlipidemia, unspecified: Secondary | ICD-10-CM

## 2022-07-22 ENCOUNTER — Ambulatory Visit
Payer: BLUE CROSS/BLUE SHIELD | Attending: Physical Medicine and Rehab | Admitting: Rehabilitative and Restorative Service Providers"

## 2022-07-22 DIAGNOSIS — G8929 Other chronic pain: Secondary | ICD-10-CM

## 2022-07-22 DIAGNOSIS — M25512 Pain in left shoulder: Secondary | ICD-10-CM | POA: Insufficient documentation

## 2022-07-22 NOTE — Interdisciplinary (Signed)
 Physical Therapy Evaluation  Ordering Physician: Honor Loh Star    Visit Diagnosis:    ICD-10-CM ICD-9-CM    1. Chronic left shoulder pain  M25.512 719.41     G89.29 338.29         Visit Number: 1     Insurance: BLUE CROSS    Past Medical History:   Diagnosis Date    Hypothyroidism     h/o hyperthyroidism s/p radioactive iodine    Infectious mononucleosis 08/19/2006    Insomnia     Menorrhagia        Past Surgical History:   Procedure Laterality Date    left wrist tendon repair  05/20/1996    CHOLECYSTECTOMY, LAP  05/20/1993    PB REMOVE TONSILS/ADENOIDS,<12 Y/O  05/20/1973    CHOLECYSTECTOMY  1997    PB MYOMECTOMY 1-4 MYOMAS 250 GM/< VAGINAL APPR      wisdom teeth         Allergies: Vicodin [hydrocodone-acetaminophen]    Current Outpatient Medications   Medication Sig Dispense Refill    acetaminophen (TYLENOL) 325 MG tablet Take 2 tablets (650 mg) by mouth every 4 hours as needed for Mild Pain (Pain Score 1-3).      azelastine (ASTELIN) 0.1 % nasal spray Spray 1 spray into each nostril 2 times daily. Use in each nostril as directed 90 mL 1    cetirizine (ZYRTEC) 10 MG tablet Take 1 tablet (10 mg) by mouth daily. 90 tablet 3    cyclobenzaprine (FLEXERIL) 5 MG tablet Take 1 tablet (5 mg) by mouth 3 times daily as needed for Muscle Spasms. 30 tablet 0    desvenlafaxine (PRISTIQ) 50 MG TB24 Take 1 tablet (50 mg) by mouth daily. 90 tablet 4    desvenlafaxine Succinate ER (PRISTIQ) 25 MG TB24 Take 1 tablet (25 mg) by mouth daily. 90 tablet 4    diclofenac (VOLTAREN) 1 % gel Apply 4 g topically 4 times daily as needed (pain). 1 each 3    fluticasone propionate (FLONASE) 50 MCG/ACT nasal spray Spray 1 spray into each nostril 2 times daily. 1 bottle 5    metroNIDAZOLE (METROGEL) 1 % gel Apply 1 Application topically daily. Use a small amount as directed 1 each 11    rosuvastatin (CRESTOR) 10 MG tablet Take 1 tablet (10 mg) by mouth daily. 90 tablet 0    SYNTHROID 100 MCG tablet Take 1 tablet (100 mcg) by mouth every  other day. Take before breakfast, alternating with 112 mcg every other day. 45 tablet 1    SYNTHROID 112 MCG tablet Take 1 tablet (112 mcg) by mouth every other day. Take before breakfast, alternating with 100 mcg every other day. 45 tablet 1    tretinoin (RETIN-A) 0.025 % cream Apply a thin layer at bedtime as directed 1 Tube 3     No current facility-administered medications for this visit.     Start of Care: 07/22/2022                     Weeks since Surgery/Injury (0 if N/A): 0    SUBJECTIVE    Onset date: Last October 2023  Mechanism of injury: Fall on her left shoulder.   History of presenting condition: Pt was carrying a laundry basket down the stairs. Pt misstepped the last step cause her to trip and land onto her left shoulder.   Pt would also like for her right shoulder to be looked at, where she also fell on her  back. Pt was at the top of stairs and tripped and twisted around and landed on her back at the top of the stairs.   Pt feels that her right shoulder has been bother her more. Pt reports of continued right shoulder stiffness.     Symptom progression since onset: Same for left shoulder and worse due to recent fall for right side (fell ~ 2 days ago).     Pain levels:  Pain Location: Left shoulder joint, anterior deltoids, and biceps tendon.  Right upper trapezius area. Pain location: Left shoulder joint, anterior deltoids, and biceps tendon.  Right upper trapezius area.  Pain Quality: Achy and stiff for left shoulder  Intensity: Current:  1/10 L and 4/10 for R     At best: 0/10 R and 1/10                 At worst: 4/10 L and 6/10 R  Frequency: Intermittent    Numbness and Tingling: no  What increases symptoms: General arthritic pain about the left shoulder. Right shoulder: typing   What decreases symptoms:  rest and movement.   Pain Irritability: Moderate  24 Hour Pattern: Yes  Sleep Disturbance: No for shoulders.     Previous treatment for condition: No other treatments for shoulders.   Diagnostic  tests: X-ray see results for details   Employment Status: Working [employer]. Pt has standing desk  Prior level of function: Independent-No Deficits  Functional limitations: Reaching   Fall history: Yes  Home Environment: 2 story condo (2 flights of stairs).   Patient goals:  Pt would like to learn exercises to improve her shoulder ROM and reduce pain.   Other subjective/PMH/Hobbies:      Pt mostly walks for exercise. Some swimming 2x/month (less due to weather).   Pt does some yoga also.     Pt does have some resistance bands and weights at home (5lbs and 2lbs).     Night pain: No      Dominant Side: right-hand dominant    Chaperone:  No sensitive procedure performed.    OBJECTIVE        New/Return Patient Check-In Form    07/08/2022  1:48 PM PST - Filed by Patient   Recent Travel Screening / Indagacin de viaje reciente   Have you traveled outside the [place] within the past 30 days? / Ha viajado fuera de los [place] en los ltimos [address]? No   Where have you traveled? / A dnde ha viajado?    Latex Allergy / [place] M[place] al ltex   Are you allergic to latex? / Es alrgico al ltex? No   Fall Risk / Riesgo de cadas   1 or more of the following = Risk for Falls / 1 o ms de los siguientes = [place] c[place] Fall within 6 months / Cada en los ltimos 6 meses   Are you age 65 or older? / Tiene 71 aos o ms? Yes   I prefer to opt out: / Prefiero no contestar:  No   What is your preferred name? / Cul es su nombre preferido?  [place]   What is your gender identity? / Cul es su identidad de gnero? Female   What is your sexual orientation? / Cul es su inclinacin sexual?  Choose not to disclose   Advance Care Planning / Planificacin de atencin anticipada   Do we have the most recent copy of? Tenemos la copia ms reciente?  [facility] Appt Reason For Visit    07/21/2022  5:41 PM PST - Filed by Patient   What is the nature of your visit? Upper Body Pain (Shoulder, Elbow, Wrist,  Hand)   Instructions   This questionnaire asks about your symptoms as well as your ability to perform certain activities. Please answer every question, based on your condition in the last week, by selecting the appropriate response.    If you did not have the opportunity to perform an activity in the past week, please make your best estimate of which response would be the most accurate.    It doesn't matter which hand or arm you use to perform the activity; please answer based on your ability regardless of how you perform the task.    Please rate your ability to do the following activities in the last week by selecting the appropriate response.   Open a tight or new jar. Mild difficulty   Write. No difficulty   Turn a key. No difficulty   Prepare a meal. No difficulty   Push open a heavy door. No difficulty   Place an object on a shelf above your head. No difficulty   Please rate your ability to do the following activities in the last week by selecting the appropriate response.   Do heavy household chores (e.g., wash walls, wash floors). Mild difficulty   Garden or do yard work. No difficulty   Make a bed. No difficulty   Carry a shopping bag or briefcase. No difficulty   Carry a heavy object (over 10 lbs). Mild difficulty   Please rate your ability to do the following activities in the last week by selecting the appropriate response.   Change a lightbulb overhead. No difficulty   Wash or blow dry your hair. No difficulty   Wash your back. Mild difficulty   Put on a Designer, television/film set. No difficulty   Use a knife to cut food. No difficulty   Please rate your ability to do the following activities in the last week by selecting the appropriate response.   Recreational activities which require little effort (e.g., cardplaying, knitting, etc.). No difficulty   Recreational activities in which you take some force or impact through your arm, shoulder or hand (e.g., golf, hammering, tennis, etc.). Moderate difficulty    Recreational activities in which you move your arm freely (e.g., playing frisbee, badminton, etc.). Mild difficulty   Manage transportation needs (getting from one place to another). No difficulty   Sexual activities. No difficulty   During the past week, to what extent has your arm, shoulder or hand problem interfered with your normal social activities with family, friends, neighbors or groups? Not at all   During the past week, were you limited in your work or other regular daily activities as a result of your arm, shoulder or hand problem? Slightly limited   Please rate the severity of the following symptoms in the last week.   Arm, shoulder or hand pain. Moderate   Arm, shoulder or hand pain when you performed any specific activity. Moderate   Tingling (pins and needles) in your arm, shoulder or hand. None   Weakness in your arm, shoulder or hand. Mild   Stiffness in your arm, shoulder or hand. Moderate   During the past week, how much difficulty have you had sleeping because of the pain in your arm, shoulder or hand? Mild difficulty   I feel less capable, less confident or less  useful because of my arm, shoulder or hand problem.  Neither agree nor disagree   Do you work? Yes   Do you play a sport or an instrument? No   DASH Score (range: 0 - 100) 15   The following questions ask about the impact of your arm, shoulder or hand problem on your ability to work (including homemaking if that is your main work role).     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 Did you have any difficulty:   Using your usual technique for your work? No difficulty   Doing your usual work because of arm, shoulder or hand pain? Mild difficulty   Doing your work as well as you would like? No difficulty   Spending your usual amount of time doing your work? No difficulty   DASH Work Module Score (range: 0 -  100) 6.25         Posture: Rounded shoulders and Increased kyphosis    Cervical Screen:   Left side bending 25  Right sidebending 11  R rotation 50 deg.  L rotation: WNL  WNL of flexion and extension of the neck    Spurling's test: -    Shoulder AROM Left 07/22/2022 Right 07/22/2022   Flexion 172 174   ABD 176 164   ER 69 80   IR HBB T12 T11   *Pain during movement  **Pain at end range    Shoulder PROM Left 07/22/2022 Right 07/22/2022   Flexion WNL WNl   ABD WNL WNL   ER WNL @90  WNL @90    IR  WNL @90  WNL @90    *Pain during movement  **Pain at end range          Strength Left 07/22/2022 Right 07/22/2022 Pain/Response   Flexion in Supination (bicep long head) 5/5 5/5    ABD (deltoid) 4/5 5/5    ER (Infraspinatus) 5/5 5/5    IR (Subscapularis) 5/5 5/5    Full Can (supraspinatus) 4/5 5/5      Special Tests    Rotator Cuff CPR: Age>60: no  External Rotation Weakness: Negative  Hawkins-Kennedy: Positive: mild pain  Drop Arm: Negative - 98% predictive on its own    Rotator Cuff Tear CPR (2):  Painful [id number]  Drop Arm: Negative  External Rotation Weakness (Infraspinatus): Negative    3/3 = 91% positive likelihood ratio (chances of positive diagnosis)    Bankart/Anterior Labral Tear:    Left Right   Crank Test Negative Negative   Apprehension Test Negative Negative   Jobe Relocation Negative Negative   Anterior Load and Shift Negative Negative   Sulcus Sign Negative Negative     Supraspinatus Testing:    Left Right   Full Can Positive: mild pain, but strong Negative   Empty Can Positive: mild pain, and mild weakness Negative   [place] Sign Positive: mild pain Negative       Joint mobility: WNL     Palpation: Mild discomfort and TTP at left AC and biceps tendon   Mild discomfort and TTP at right AC.        ASSESSMENT  Rehabilitation Potential: Excellent    Patient presents with left shoulder pain secondary to impingement syndrome. Deficits include mild left shoulder weakness and painful ROM, limiting the patient from overhead  reaching. In addition, pt with right shoulder strain and neck strain from a  fall.  Patient will benefit from skilled therapy to address impairments and improve function. Patient was given a home exercise program to begin addressing evaluation findings and was educated on how to perform effectively and safely. Patient was instructed to perform home exercise program as tolerated. Discussed evaluation findings with patient and educated on therapy potential and plan of care.    Plan for next visit: Progress shoulder ROM HEP and reassess ROM.     TherEx (97110):  Access Code: [email or web address]  URL: [email or web address]  Date: 07/22/2022  Prepared by:     Exercises  - Thoracic Mobilization on Foam Roll - Hands Clasped  - 1 x daily - 5 x weekly - 1 sets - 15 reps - 5 hold  - [place] Angels on Foam Roll  - 1 x daily - 5 x weekly - 1 sets - 15 reps - 2 hold  - Seated Upper Trapezius Stretch  - 1 x daily - 5 x weekly - 3 sets - 1 reps - 30 hold  - Circular Shoulder Pendulum with Table Support  - 1 x daily - 5 x weekly - 1 sets - 15 reps - 2 hold  - Sidelying Shoulder ER with Towel and Dumbbell  - 1 x daily - 5 x weekly - 3 sets - 10 reps - 2 hold  - Standing Shoulder Scaption with Resistance  - 1 x daily - 5 x weekly - 3 sets - 10 reps - 2 hold  - Squatting High Shoulder Row with Resistance  - 1 x daily - 5 x weekly - 3 sets - 10 reps - 2 hold    Pt educated on shoulder and rotator cuff anatomy.       Goals  Goal 1 Impairment: Education need  Goal 1: Patient able to return demonstrate Home Exercise Program independently to enable patient to achieve stated functional goals   Number of Visits: 3-5  Status :New    Goal 2 Impairment: Range of motion limitations  Goal 2: Pt will demonstrate improved painfreel left shoulder ROM so that pt can repetitively reach overhead for her ADL and work at a desk with pain < 3/10  Number of Visits: 3-5  Status: New    Goal 3 Impairment: Strength limitations  Goal 3: Pt will demonstrate improved  left shoulder strenght to grossly 5/5 MMT so that pt can return to lifting and carrying > 10lbs overhead and at her waist for her ADL with less pain.  Number of Visits: 5-7  Status: New    Long Term Goal Impairment: Activity Tolerance Limitation  Long Term Goal: Pt will return to PLOF ADL, house tasks, exercise, and swimming with a improved left shoulder ROM and 5/5 MMT strength.  Number of Visits: 10-14  Status: New    Long Term Goal Outcome: Patient will decrease QuickDASH by MCID of at least 14 points to reflect clinically significant positive change in function since starting therapy  Number of Visits: 10-14  Status: New                  PLAN  Continue therapy to address: Pain;Activity tolerance limitation;Range of motion limitation;Strength impairment;Difficulty with activities of daily living   Treatment Frequency: 1 time per week   Treatment Duration: 12 weeks       Type of Eval  Low Complexity (16109): Completed  Therapeutic Exercise: Home Exercise Program (HEP) demonstration and performance  Total TIMED Treatment (min) Therapeutic Exercise  (97110): 30  Therapeutic Exercise: see  above                 Treatment Time   Total TIMED Treatment  (min): 30  Total Treatment Time (min): 50     Therapist and Patient discussed treatment plan for     ICD-10-CM ICD-9-CM    1. Chronic left shoulder pain  M25.512 719.41     G89.29 338.29        and all parties agreeable to the following recommendations.    Planned Therapy Interventions: Manual Therapy, Patient Education, and Therapeutic Exercise    Goals of treatment reviewed with Laelani? Yes  Home Exercise Program issued? Yes  Education provided: Home Exercise Program

## 2022-07-22 NOTE — Telephone Encounter (Addendum)
Brittany [name], CPhT  ([facility])      [facility] Refill Protocol    Last visit in enc specialty: 06/29/2021     Recent Visits in This Encounter Department       Date Provider Department Visit Type Primary Dx    06/29/2021 [name], MD [place] [facility] Office Visit Allergic rhinitis, unspecified seasonality, unspecified trigger    10/06/2020 Hakim, Cleophas Dunker, MD [place] [facility] Office Visit Acquired hypothyroidism    05/16/2020 Hakim, Cleophas Dunker, MD [place] [facility] Office Visit Chest pain, unspecified type    02/25/2020 Hakim, Cleophas Dunker, MD [place] [facility] Office Visit Encounter for routine adult medical examination    01/25/2020 Rennis Chris, MD, PhD [place] [facility] Visit Plantar fasciitis, bilateral           Population Health Visits  Recent [facility] Visits    None       Next f/u appt due:  none   Next appt in enc specialty: 08/02/2022      Future Appointments 07/22/2022 - 07/21/2027        Date Visit Type Department Provider     07/22/2022  4:30 PM PT EVAL [place] [facility] Ivette Loyal, PT             08/02/2022  9:45 AM RETURN PHYSICAL EXAM [place] [facility] Hakim, Cleophas Dunker, MD    Appointment Notes:     FatigueWhite and red blood cell counts             01/23/2023  8:00 AM [facility] [place] [facility] Koren Bound, MD    Appointment Notes:     Glaucoma sus ou             05/07/2023 10:30 AM [facility] Marguerite Olea, MD    Appointment Notes:     TBSE                    Per OV  06/29/2021  HPI-  HLD:  Diet: Gluten intolerant but not celiac. Olive oil. Does not eat pasta but eats potatoes in various forms. Brown rice. Gluten free bread. Organic dark chocolate for sweets.      The 10-year ASCVD  risk score (Arnett DK, et al., 2019) is: 1.3%    Values used to calculate the score:      Age: 57 years      Sex: Female      Is Non-Hispanic African American: No      Diabetic: No      Tobacco smoker: No      Systolic Blood Pressure: [id number] mmHg      Is BP treated: No      HDL Cholesterol: 80 mg/dL      Total Cholesterol: 258 mg/dL       A/P-Hyperlipidemia, unspecified hyperlipidemia type  -     Lipid Panel Green Plasma Separator Tube; Future          LABS required:  (Q year Lipid Panel, ALT (baseline only))    Lab Results   Component Value Date    CHOL 162 12/27/2021    TRIG 74 12/27/2021    HDL 67 12/27/2021  [place] 95 12/27/2021    LDLCALC 80 12/27/2021    LDL 155 09/19/2003        Lab Results   Component Value Date    ALT 19 12/27/2021         Monitoring required:  (None)

## 2022-07-22 NOTE — Telephone Encounter (Signed)
Appt is made 09/05

## 2022-07-23 MED ORDER — ROSUVASTATIN CALCIUM 10 MG OR TABS
10.00 mg | ORAL_TABLET | Freq: Every day | ORAL | 0 refills | Status: DC
Start: 2022-07-23 — End: 2022-10-21

## 2022-07-25 ENCOUNTER — Encounter (INDEPENDENT_AMBULATORY_CARE_PROVIDER_SITE_OTHER): Payer: Self-pay | Admitting: Optometrist

## 2022-08-01 ENCOUNTER — Other Ambulatory Visit (INDEPENDENT_AMBULATORY_CARE_PROVIDER_SITE_OTHER): Payer: Self-pay | Admitting: Student in an Organized Health Care Education/Training Program

## 2022-08-01 DIAGNOSIS — M545 Low back pain, unspecified: Secondary | ICD-10-CM

## 2022-08-01 NOTE — Progress Notes (Signed)
Internal Medicine Primary Care Clinic Note    Chief Complaint   Patient presents with    Physical    Fatigue       SUBJECTIVE::Brittany [name] is a 57 year old female here for annual exam.    States that she is a candidate for knee replacement.  Fell twice, int eh fall and a few weeks ago. Going to PT for shoulder pain as she fell on her shoulder one time and another shoulder another time.  First time, itw as dark going down the stairs and missed the last step. Then the     Reports trouble sleeping.    Reports tinnitus in the right ear, stable.   Has not had a hearing test in a long time.      PAVS Total Activity: 90 minutes/week minutes/week  I have counseled Brittany [name] on improving her level of exercise in the following way:     Advised patient to engage in physical activity as recommended in the patient education handout.   Got a knee brace and hoping to eat more.  Still has nights when she eats bag of potato chips.  Fruits instead of chips.  Eating hummus with chips to fill up faster.  Rice cakes.    Getting to sleep is an issue.    Doing free yoga once a week at work.    Seeing an acupressure therapist once a month.    #Hypothyroidism: Has been on alternating doses of Synthroid 112 mcg and 100 mcg every other day since 09/2020.    ROS  A complete ROS was done and was negative except as documented above in HPI.  Constitutional: negative for: night sweats, weight loss, loss of energy, anorexia, fever.   Eyes: negative for: blurry vision, visual loss, red eyes, eye pain.   CV: negative for: palpitations, chest pain, paroxysmal nocturnal dyspnea, orthopnea, lower extremity edema, pain in calves with walking   Resp: negative for: cough, shortness of breath, pleuritic pain, wheezing, DOE  GI: negative for: heartburn, nausea, vomiting, abdominal pain, constipation, diarrhea, jaundice, change in stool caliber, rectal bleeding or black stools  GU: negative for: nocturia, frequency, dysuria, urgency, hesitancy,  hematuria, incontinence   Musculoskeletal: negative for: back pain, muscle pain, joint pain, joint swelling, joint redness   Allergy/Immun: negative for: stuffy nose and sinuses, itchy/red/tearing eyes  Skin: no new skin rashes or lesions      A/P:  Brittany [name] is a 57 year old female here for annual exam.      Brittany [name] was seen today for physical and fatigue.    Diagnoses and all orders for this visit:    Encounter for routine adult medical examination    Encounter for routine laboratory testing  -     CBC w/ Diff [place]; Future  -     Comprehensive Metabolic Panel; Future  -     Glycosylated Hgb(A1C), Blood [place]ender; Future  -     Lipid Panel Green Plasma Separator Tube; Future  -     TSH, Blood; Future    Acquired hypothyroidism  -     TSH, Blood; Future    Hyperlipidemia, unspecified hyperlipidemia type  -     Lipid Panel Green Plasma Separator Tube; Future    Chronic midline low back pain without sciatica  -     cyclobenzaprine (FLEXERIL) 5 MG tablet; Take 1 tablet (5 mg) by mouth 3 times daily as needed for Muscle Spasms.    Total bilirubin,  elevated  Resulted after visit today. Direct bilirubin added. 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      #Health Maintenance  Routine health care maintenance were dealt with today. Appropriate screening labs and studies were ordered, and preventative counseling was done.    The 10-year ASCVD risk score (Arnett DK, et al., 2019) is: 1.1%    Values used to calculate the score:      Age: 37 years      Sex: Female      Is Non-Hispanic African American: No      Diabetic: No      Tobacco smoker: No      Systolic Blood Pressure: 99991111 mmHg      Is BP treated: No      HDL Cholesterol: 67 mg/dL      Total Cholesterol: 162 mg/dL      Patient Instructions   Audiology Department scheduling phone numbers:   [facility]: [place] [phone]       Return in about 1 year (around 08/02/2023).      Barriers to Learning assessed: none. Patient verbalizes understanding of teaching and  instructions and is agreeable to above plan.    ________    OBJECTIVE:  Physical Exam  BP 111/62   Pulse 88   Ht 5\' 9"  (1.753 m)   Wt 88.9 kg (196 lb)   LMP 08/19/2019   SpO2 97%   BMI 28.94 kg/m   GEN: WDWN, NAD, well-appearing  EYES: Pupils equal and responsive to light.  Extraocular movements intact.  HEAD: TM normal b/l.  Throat, oral cavity and tongue normal.    NECK: Neck supple. No adenopathy or masses in the neck or supraclavicular regions.   CHEST:  Clear, good air entry, no wheezes, rhonchi or rales.   HEART:  Regular rate and rhythm. S1 and S2 normal, no murmurs, clicks, gallops or rubs.    ABDOMEN:  Soft without tenderness, guarding, mass or organomegaly.  BS+  EXTREMITIES:  WWP, no LE edema   SKIN:  No rashes or suspicious skin lesions noted.  PSYCH: Oriented to person, place and time. Normal affect and judgment.       I have reviewed the most recent lab and imaging data.      Lab Results   Component Value Date    WBC 4.6 07/05/2021    RBC 4.19 07/05/2021    HGB 13.2 07/05/2021    HCT 39.8 07/05/2021    MCV 95.0 07/05/2021    MCHC 33.2 07/05/2021    RDW 11.7 (L) 07/05/2021    PLT 199 07/05/2021    MPV 11.5 07/05/2021       Lab Results   Component Value Date    NA 142 12/27/2021    K 3.9 12/27/2021    CL 107 12/27/2021    BICARB 24 12/27/2021    BUN 13 12/27/2021    CREAT 0.83 12/27/2021    GLU 103 (H) 12/27/2021    [place] 9.7 12/27/2021       Lab Results   Component Value Date    AST 22 12/27/2021    ALT 19 12/27/2021    ALK 72 12/27/2021    TP 6.7 12/27/2021    ALB 4.5 12/27/2021    TBILI 1.74 (H) 12/27/2021    DBILI 0.2 11/25/2014       Lab Results   Component Value Date    CHOL 162 12/27/2021    HDL 67 12/27/2021    LDLCALC 80 12/27/2021  TRIG 74 12/27/2021        Lab Results   Component Value Date    A1C 5.4 02/25/2020    A1C 5.4 02/19/2019        Lab Results   Component Value Date    TSH 0.35 12/27/2021         Problem list updated today:   Patient Active Problem List    Diagnosis Date Noted     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    Inadequate exercise - not at goal 06/29/2021    Chronic midline low back pain without sciatica 10/17/2015    Hyperlipidemia, unspecified hyperlipidemia type 09/08/2015    Impingement syndrome, shoulder, right 05/02/2015    Radial styloid tenosynovitis 05/02/2015    Allergic rhinitis due to pollen 04/11/2014    Gastroesophageal reflux disease without esophagitis 04/11/2014    Bilateral low back pain without sciatica 09/21/2013    Tension headache 12/26/2011    Gluten intolerance 09/17/2011    Dyspepsia 07/15/2011    Intestinal gas excretion 07/15/2011    Right knee pain 03/13/2011    Hearing loss 12/14/2010    Cerumen impaction 12/14/2010    Skin lesion 12/05/2010    Palpitations 10/02/2010    Fatigue 06/15/2010    Depression 07/25/2009    Sebaceous cyst 03/23/2009    Insomnia 11/16/2008    Acquired hypothyroidism 09/22/2008    Right foot pain 08/18/2008    Grave's disease 01/15/2008    Vitamin D deficiency 01/06/2008    Benign neoplasm of skin, site unspecified 08/27/2007       Past medical history, surgical history, and family history reviewed and updated today as below.  Allergies and medications reviewed and updated as below.    Past Medical History:   Diagnosis Date    Hypothyroidism     h/o hyperthyroidism s/p radioactive iodine    Infectious mononucleosis 08/19/2006    Insomnia     Menorrhagia        Past Surgical History:   Procedure Laterality Date    left wrist tendon repair  05/20/1996    CHOLECYSTECTOMY, LAP  05/20/1993    PB REMOVE TONSILS/ADENOIDS,<12 Y/O  05/20/1973    CHOLECYSTECTOMY  1997    PB MYOMECTOMY 1-4 MYOMAS 250 GM/< VAGINAL APPR      wisdom teeth         Family History   Problem Relation Name Age of Onset    Breast Cancer Mother Genny Ball - deceased 42    Cancer Mother Aizlynn Mendias - deceased         Stage 4 breast cancer    Other Sister Hadil Demirjian - deceased         Uterine fibroids/hysterectomy    Crohn's Disease Sister Auzhane Slutsky - deceased      Cancer Sister Karl Staunton - deceased         Endometrial cancer    Breast Cancer Sister Toniann Ket 40    Hypertension Sister Toniann Ket         Hypertension / obese    Cancer Sister          Endometrial cancer at 57    Hypertension Father Mr. RUNETTE SONTAG - deceased     Hypertension Sister      Cholesterol/Lipid Disorder Sister      Diabetes Sister Iridian Heinzman         Adult onset    Hypertension Sister Leonora Westbrooks     Thyroid Sister Elis Molette  Ovarian Cancer Neg Hx          but sister w/ovarian cysts age 69       Social History:  Social History     Socioeconomic History    Marital status: Divorced   Occupational History    Occupation: Office manager   Tobacco Use    Smoking status: Never     Passive exposure: Yes    Smokeless tobacco: Never    Tobacco comments:     2nd hand smoke   Substance and Sexual Activity    Alcohol use: Yes     Types: 2 Glasses of wine per week     Comment: 1 bottle wine /week    Drug use: No    Sexual activity: Not Currently     Partners: Male     Birth control/protection: Condom   Other Topics Concern    Military Service No    Blood Transfusions No    Caffeine Concern No    Occupational Exposure No    Hobby Hazards No    Sleep Concern Yes    Stress Concern Yes    Weight Concern No    Special Diet Yes     Comment: Gluten sensitivity, mother and sister are Celiac    Back Care Yes    Exercises Regularly Yes    Bike Helmet Use Yes    Seat Belt Use Yes    Performs Self-Exams No   Social History Narrative    09-10-17- Divorced and no kids. [place] and marketing and communication. Lives on own and social theatre/culture. Yoga and swimming/hiking.        2018/09/11: Sister died of endometrial cancer at 88 yo this year. Very precipitous. Mother still alive in [place] living.        She is youngest-passing has caused much pain        11-Sep-2019: Hikes occasionally. Yoga on her own. Single.      Social Determinants of Health      Exercise is Medicine ([facility])     Social History     Social History Narrative     10-Sep-2017- Divorced and no kids. [place] and marketing and communication. Lives on own and social theatre/culture. Yoga and swimming/hiking.        11-Sep-2018: Sister died of endometrial cancer at 43 yo this year. Very precipitous. Mother still alive in [place].        She is youngest-passing has caused much pain        2019-09-11: Hikes occasionally. Yoga on her own. Single.        Allergies:  Allergies   Allergen Reactions    Vicodin [Hydrocodone-Acetaminophen] Hallucinations       Current Outpatient Medications   Medication Sig    acetaminophen (TYLENOL) 325 MG tablet Take 2 tablets (650 mg) by mouth every 4 hours as needed for Mild Pain (Pain Score 1-3).    azelastine (ASTELIN) 0.1 % nasal spray Spray 1 spray into each nostril 2 times daily. Use in each nostril as directed    cetirizine (ZYRTEC) 10 MG tablet Take 1 tablet (10 mg) by mouth daily.    cyclobenzaprine (FLEXERIL) 5 MG tablet Take 1 tablet (5 mg) by mouth 3 times daily as needed for Muscle Spasms.    desvenlafaxine (PRISTIQ) 50 MG TB24 Take 1 tablet (50 mg) by mouth daily.    desvenlafaxine Succinate ER (PRISTIQ) 25 MG TB24 Take 1 tablet (25 mg) by mouth daily.  diclofenac (VOLTAREN) 1 % gel Apply 4 g topically 4 times daily as needed (pain).    fluticasone propionate (FLONASE) 50 MCG/ACT nasal spray Spray 1 spray into each nostril 2 times daily.    metroNIDAZOLE (METROGEL) 1 % gel Apply 1 Application topically daily. Use a small amount as directed    rosuvastatin (CRESTOR) 10 MG tablet Take 1 tablet (10 mg) by mouth daily.    SYNTHROID 100 MCG tablet Take 1 tablet (100 mcg) by mouth every other day. Take before breakfast, alternating with 112 mcg every other day.    SYNTHROID 112 MCG tablet Take 1 tablet (112 mcg) by mouth every other day. Take before breakfast, alternating with 100 mcg every other day.    tretinoin (RETIN-A) 0.025 % cream Apply a thin layer at bedtime as directed     No current facility-administered medications for this visit.

## 2022-08-02 ENCOUNTER — Ambulatory Visit (INDEPENDENT_AMBULATORY_CARE_PROVIDER_SITE_OTHER): Payer: BLUE CROSS/BLUE SHIELD | Admitting: Student in an Organized Health Care Education/Training Program

## 2022-08-02 ENCOUNTER — Other Ambulatory Visit: Payer: BLUE CROSS/BLUE SHIELD | Attending: Student in an Organized Health Care Education/Training Program

## 2022-08-02 ENCOUNTER — Encounter (INDEPENDENT_AMBULATORY_CARE_PROVIDER_SITE_OTHER): Payer: Self-pay | Admitting: Student in an Organized Health Care Education/Training Program

## 2022-08-02 VITALS — BP 111/62 | HR 88 | Ht 69.0 in | Wt 196.0 lb

## 2022-08-02 DIAGNOSIS — G8929 Other chronic pain: Secondary | ICD-10-CM

## 2022-08-02 DIAGNOSIS — R17 Unspecified jaundice: Secondary | ICD-10-CM

## 2022-08-02 DIAGNOSIS — E785 Hyperlipidemia, unspecified: Secondary | ICD-10-CM | POA: Insufficient documentation

## 2022-08-02 DIAGNOSIS — E039 Hypothyroidism, unspecified: Secondary | ICD-10-CM | POA: Insufficient documentation

## 2022-08-02 DIAGNOSIS — M545 Low back pain, unspecified: Secondary | ICD-10-CM

## 2022-08-02 DIAGNOSIS — Z0000 Encounter for general adult medical examination without abnormal findings: Secondary | ICD-10-CM

## 2022-08-02 DIAGNOSIS — Z0189 Encounter for other specified special examinations: Secondary | ICD-10-CM | POA: Insufficient documentation

## 2022-08-02 LAB — TSH, BLOOD: TSH: 0.16 u[IU]/mL — ABNORMAL LOW (ref 0.27–4.20)

## 2022-08-02 LAB — COMPREHENSIVE METABOLIC PANEL, BLOOD
ALT (SGPT): 22 U/L (ref 0–33)
AST (SGOT): 19 U/L (ref 0–32)
Albumin: 4.8 g/dL (ref 3.5–5.2)
Alkaline Phos: 83 U/L (ref 40–130)
Anion Gap: 12 mmol/L (ref 7–15)
BUN: 16 mg/dL (ref 6–20)
Bicarbonate: 25 mmol/L (ref 22–29)
Bilirubin, Tot: 1.62 mg/dL — ABNORMAL HIGH (ref <1.2)
Calcium: 9.6 mg/dL (ref 8.5–10.6)
Chloride: 105 mmol/L (ref 98–107)
Creatinine: 0.77 mg/dL (ref 0.51–0.95)
Glucose: 100 mg/dL — ABNORMAL HIGH (ref 70–99)
Potassium: 4.3 mmol/L (ref 3.5–5.1)
Sodium: 142 mmol/L (ref 136–145)
Total Protein: 6.6 g/dL (ref 6.0–8.0)
eGFR Based on CKD-EPI 2021 Equation: >60 mL/min/{1.73_m2}

## 2022-08-02 LAB — CBC WITH DIFF, BLOOD
ANC-Automated: 2 10*3/uL (ref 1.6–7.0)
Abs Basophils: 0 10*3/uL (ref <0.2)
Abs Eosinophils: 0.1 10*3/uL (ref 0.0–0.5)
Abs Lymphs: 3.2 10*3/uL — ABNORMAL HIGH (ref 0.8–3.1)
Abs Monos: 0.3 10*3/uL (ref 0.2–0.8)
Basophils: 0 %
Eosinophils: 1 %
Hct: 40.6 % (ref 34.0–45.0)
Hgb: 13.9 gm/dL (ref 11.2–15.7)
Lymphocytes: 56 %
MCH: 32.1 pg — ABNORMAL HIGH (ref 26.0–32.0)
MCHC: 34.2 g/dL (ref 32.0–36.0)
MCV: 93.8 um3 (ref 79.0–95.0)
MPV: 12 fL (ref 9.4–12.4)
Monocytes: 6 %
Plt Count: 185 10*3/uL (ref 140–370)
RBC: 4.33 10*6/uL (ref 3.90–5.20)
RDW: 11.9 % — ABNORMAL LOW (ref 12.0–14.0)
Segs: 36 %
WBC: 5.7 10*3/uL (ref 4.0–10.0)

## 2022-08-02 LAB — LIPID(CHOL FRACT) PANEL, BLOOD
Cholesterol: 180 mg/dL (ref <200)
HDL-Cholesterol: 76 mg/dL
LDL-Chol (Calc): 79 mg/dL (ref <160)
Non-HDL Cholesterol: 104 mg/dL
Triglycerides: 123 mg/dL (ref 10–170)

## 2022-08-02 LAB — GLYCOSYLATED HGB(A1C), BLOOD: Glyco Hgb (A1C): 5.8 % (ref 4.8–5.8)

## 2022-08-02 MED ORDER — CYCLOBENZAPRINE HCL 5 MG OR TABS
5.0000 mg | ORAL_TABLET | Freq: Three times a day (TID) | ORAL | 0 refills | Status: AC | PRN
Start: 2022-08-02 — End: ?

## 2022-08-02 NOTE — Interdisciplinary (Signed)
Blood drawn from right arm with 21 gauge needle. 3 tubes taken.   Patient identity authenticated by Helen Grace Rasch.

## 2022-08-02 NOTE — Patient Instructions (Signed)
Audiology Department scheduling phone numbers:   [facility]: [phone]   [place] [phone]

## 2022-08-02 NOTE — Telephone Encounter (Signed)
flexeril is not currently included in the Pharmacy Refill Clinic protocols. Re-routing to the responsible staff for processing.  Thank you

## 2022-08-03 ENCOUNTER — Telehealth (INDEPENDENT_AMBULATORY_CARE_PROVIDER_SITE_OTHER): Payer: Self-pay | Admitting: Student in an Organized Health Care Education/Training Program

## 2022-08-03 DIAGNOSIS — E039 Hypothyroidism, unspecified: Secondary | ICD-10-CM

## 2022-08-04 LAB — BILIRUBIN, DIR BLOOD: Bilirubin, Dir: 0.3 mg/dL — ABNORMAL HIGH (ref <=0.2)

## 2022-08-05 ENCOUNTER — Ambulatory Visit: Payer: BLUE CROSS/BLUE SHIELD | Admitting: Rehabilitative and Restorative Service Providers"

## 2022-08-05 MED ORDER — LEVOTHYROXINE SODIUM 88 MCG OR TABS
88.0000 ug | ORAL_TABLET | Freq: Every day | ORAL | 0 refills | Status: DC
Start: 2022-08-05 — End: 2022-10-10

## 2022-08-08 ENCOUNTER — Encounter (INDEPENDENT_AMBULATORY_CARE_PROVIDER_SITE_OTHER): Payer: Self-pay | Admitting: Student in an Organized Health Care Education/Training Program

## 2022-08-10 ENCOUNTER — Ambulatory Visit: Payer: BLUE CROSS/BLUE SHIELD | Admitting: Rehabilitative and Restorative Service Providers"

## 2022-08-12 NOTE — Telephone Encounter (Addendum)
Routing to Dr. Charlynn Court for review and advise.    From: [name]  Sent: 08/11/2022 10:28 AM PDT  To: Mearl Latin Ma/Nurse  Subject: Lab results    Thank you, Dr. Charlynn Court. I started the 88 mcg only Synthroid several days ago. Feel very shaky during the day and sometimes dizzy in the morning. I still have over 30 tablets of 100 mcg and would like to try every other day with the 88 to see if that works.

## 2022-08-12 NOTE — Telephone Encounter (Addendum)
I am ok with that.  Let's get an follow up appnt on the schedule for beginning of May to follow up on TSH and follow up if any ongoing symptoms.

## 2022-08-17 ENCOUNTER — Ambulatory Visit (HOSPITAL_BASED_OUTPATIENT_CLINIC_OR_DEPARTMENT_OTHER): Payer: BLUE CROSS/BLUE SHIELD | Admitting: Rehabilitative and Restorative Service Providers"

## 2022-08-21 ENCOUNTER — Other Ambulatory Visit (INDEPENDENT_AMBULATORY_CARE_PROVIDER_SITE_OTHER): Payer: BLUE CROSS/BLUE SHIELD

## 2022-08-21 ENCOUNTER — Encounter (INDEPENDENT_AMBULATORY_CARE_PROVIDER_SITE_OTHER): Payer: Self-pay | Admitting: Medical

## 2022-08-21 ENCOUNTER — Ambulatory Visit (INDEPENDENT_AMBULATORY_CARE_PROVIDER_SITE_OTHER): Payer: BLUE CROSS/BLUE SHIELD | Admitting: Medical

## 2022-08-21 VITALS — BP 122/61 | HR 88 | Temp 98.4°F | Resp 18

## 2022-08-21 DIAGNOSIS — R059 Cough, unspecified: Secondary | ICD-10-CM

## 2022-08-21 LAB — RAPID INFLUENZA A&B NAAT (POCT)
Influenza A, Rapid: NOT DETECTED
Influenza B, Rapid: NOT DETECTED

## 2022-08-21 LAB — COVID-19 RAPID NAAT (POCT): COVID-19 Rapid Assay (POCT): NOT DETECTED

## 2022-08-21 NOTE — Progress Notes (Signed)
Chief Complaint:   Chief Complaint   Patient presents with    URI     Started a week ago, got better and restarted again yesterday       Case summary is as follows: Brittany [name] is a 57 year old female with chief c/o upper respiratory symptoms.  Patient states her symptoms started almost 1 week ago, resolved and improved for a proximally 2 days but over the past few days her symptoms have worsened with new onset of subjective fever body aches chills and persistent cough.  Patient has not checked her temperature, she has tried over-the-counter medications with no significant improvement of symptoms.      Past Medical History:   Diagnosis Date    Hypothyroidism     h/o hyperthyroidism s/p radioactive iodine    Infectious mononucleosis 08/19/2006    Insomnia     Menorrhagia          Review of Systems: see HPI      Physical Examination:    08/21/22  1114   BP: 122/61   Pulse: 88   Temp: 98.4 F (36.9 C)   Resp: 18   SpO2: 99%     Vital Signs noted from Triage Page.    General:  WDWN, no distress  Head: NCAT  Eyes: PERRL. No conjunctival injection.  Ears:  External ears without erythema, edema, or otorrhea.  TMs NML.   Mouth:  MMM. Oropharynx clear without erythema, lesions, or exudate.  Tonsils are symmetrical, uvula is midline.  Swallowing secretions without difficulty.  No trismus or hot potato voice.  Minimal postnasal drip noted  Nose:  Nasal congestion and rhinorrhea are present.  Neck:  Supple.  Non-tender. No lymphadenopathy.  Chest: Lungs CTAB, respirations are non-labored.  Occasional cough is noted.  No tachypnea, dyspnea, hypoxia.  Speaks in full sentences without difficulty.  Heart:  RRR.  Normal S1S2.  Cap refill <2 sec.  Extremities:  MAE x4   Neuro:  GCS 15. Normal mentation.   Skin: No rash is visualized.        Results:    Results for orders placed or performed in visit on 08/21/22   Covid-19 Rapid NAAT (POCT)   Result Value Ref Range    COVID-19 Rapid Assay (POCT) Not Detected Not Detected    Rapid Influenza A&B NAAT (POCT)   Result Value Ref Range    Influenza A, Rapid Not Detected Not Detected    Influenza B, Rapid Not Detected Not Detected     X-Ray Chest Frontal And Lateral    Result Date: 08/21/2022  EXAM DESCRIPTION: X-RAY CHEST FRONTAL AND LATERAL CLINICAL HISTORY: Cough COMPARISON: 05/09/2020 FINDINGS: Lines and Tubes: None Mediastinum: The cardiomediastinal silhouette is unremarkable. No lymphadenopathy is appreciated. Lungs: The lungs are clear. Pleura: No pneumothorax or effusion. Bones and soft tissues: Unremarkable    Signed by: Basil Dess 08/21/2022 11:40:48    IMPRESSION: No acute or chronic explanation for cough           Impression:     ICD-10-CM ICD-9-CM    1. Cough, unspecified type  R05.9 786.2 X-Ray Chest Frontal And Lateral          Negative for influenza, COVID in clinic.  Findings of viral upper respiratory infection Low suspicion for bacterial sinusitis (duration of symptoms <10 days), strep pharyngitis (no exudate, no palatal petechiae), PNA (no adventitious breath sounds), bronchoconstriction/reactive airway disease (no wheezing or decreased aeration).       Patient well  appearing and no acute distress.  X-ray is negative for signs of pneumonia.        Plan:  - recommend over-the-counter treatments and continued observation of symptoms  -Symptomatic treatment with Tylenol, Motrin, rest, hydration.    -Anticipatory guidance and supportive care for viral illness was discussed.    -Follow up and return/ED precautions were indicated.  Pt/caregiver agrees with plan of care and verbalized understanding.      Pt was provided a copy of the after visit summary which includes information on their diagnosis and prescriptions that were prescribed today. If they declined a paper copy, a digital copy was made available to them on MyChart.     Portions of this encounter were used with voice recognition software. Some errors, erroneous grammar and misrepresented words may be reflected  in the dictation.     Supervising physician for this visit is Dr. Roselyn Reef

## 2022-08-21 NOTE — Patient Instructions (Signed)
OTC Tylenol and Motrin as needed for pain.  OTC Guaifenesin as needed for congestion.  OTC Fluticasone nasal spray as needed for congestion.  Please be re-evaluated if you have persistent fever > 100.3F (38C) for more than 5 consecutive days.  Return to clinic if your symptoms worsen or fail to improve 10 days from onset.  Go to ED for chest pain, shortness breath, rapid or difficulty breathing, fainting or near fainting, palpitations, persistent vomiting, severe pain, or any other concerns.

## 2022-08-22 ENCOUNTER — Encounter (INDEPENDENT_AMBULATORY_CARE_PROVIDER_SITE_OTHER): Payer: Self-pay | Admitting: Medical

## 2022-08-23 ENCOUNTER — Ambulatory Visit: Payer: BLUE CROSS/BLUE SHIELD | Admitting: Rehabilitative and Restorative Service Providers"

## 2022-08-23 MED ORDER — BENZONATATE 100 MG OR CAPS
100.0000 mg | ORAL_CAPSULE | Freq: Three times a day (TID) | ORAL | 0 refills | Status: AC | PRN
Start: 2022-08-23 — End: 2022-09-02

## 2022-08-23 NOTE — Addendum Note (Signed)
Addended by: Rushie Goltz on: 08/23/2022 08:02 AM     Modules accepted: Orders

## 2022-09-18 ENCOUNTER — Encounter (INDEPENDENT_AMBULATORY_CARE_PROVIDER_SITE_OTHER): Payer: Self-pay | Admitting: Hospital

## 2022-10-07 ENCOUNTER — Other Ambulatory Visit: Payer: BLUE CROSS/BLUE SHIELD | Attending: Student in an Organized Health Care Education/Training Program

## 2022-10-07 DIAGNOSIS — E039 Hypothyroidism, unspecified: Secondary | ICD-10-CM | POA: Insufficient documentation

## 2022-10-07 LAB — TSH, BLOOD: TSH: 1.91 u[IU]/mL (ref 0.27–4.20)

## 2022-10-07 NOTE — Interdisciplinary (Signed)
Blood drawn from right arm with 23 gauge needle. 1 tubes taken.   Patient identity authenticated by Lisa Romero.

## 2022-10-08 ENCOUNTER — Encounter (INDEPENDENT_AMBULATORY_CARE_PROVIDER_SITE_OTHER): Payer: Self-pay | Admitting: Student in an Organized Health Care Education/Training Program

## 2022-10-08 DIAGNOSIS — E039 Hypothyroidism, unspecified: Secondary | ICD-10-CM

## 2022-10-09 NOTE — Telephone Encounter (Signed)
From: [name]  To: Miriam Nabil Hakim  Sent: 10/08/2022 6:37 PM PDT  Subject: Synthroid 100mg     Hello Dr. Corrinne Eagle,  Got my bloodwork done yesterday. Results show range is normal. I have been alternating 100mg  of synthroid with 88mg  every other day. I'm out of the 100mg  but it's not showing in my medication list to renew. After you review the lab results, please prescribe 100mg  synthroid in addition to the 88mg  if you agree.  Thank you!  [name]

## 2022-10-09 NOTE — Telephone Encounter (Signed)
Established with:  Last OV with PCP:  Next OV with Dept: Brittany [name]   08/02/2022  Visit date not found     Medication requested:   Requested Prescriptions     Pending Prescriptions Disp Refills    SYNTHROID 100 MCG tablet 45 tablet 1     Sig: Take 1 tablet (100 mcg) by mouth every other day. Take before breakfast, alternating with 112 mcg every other day.       Last Filled Date 03/15/22   Quantity Last Filled  45 tablet   Refill 1   Send to:    [id number] - [place], [place] - [address]  [address]  [place] [address]  Phone: [phone] Fax: [phone] Alternate Fax: [phone]    [facility]  [address]  [place] [address]  Phone: [phone] Fax: [phone]      Current Medication(s):  Current Outpatient Medications   Medication Sig Dispense Refill    acetaminophen (TYLENOL) 325 MG tablet Take 2 tablets (650 mg) by mouth every 4 hours as needed for Mild Pain (Pain Score 1-3).      azelastine (ASTELIN) 0.1 % nasal spray Spray 1 spray into each nostril 2 times daily. Use in each nostril as directed 90 mL 1    cetirizine (ZYRTEC) 10 MG tablet Take 1 tablet (10 mg) by mouth daily. 90 tablet 3    cyclobenzaprine (FLEXERIL) 5 MG tablet Take 1 tablet (5 mg) by mouth 3 times daily as needed for Muscle Spasms. 30 tablet 0    desvenlafaxine (PRISTIQ) 50 MG TB24 Take 1 tablet (50 mg) by mouth daily. 90 tablet 4    desvenlafaxine Succinate ER (PRISTIQ) 25 MG TB24 Take 1 tablet (25 mg) by mouth daily. 90 tablet 4    diclofenac (VOLTAREN) 1 % gel Apply 4 g topically 4 times daily as needed (pain). 1 each 3    fluticasone propionate (FLONASE) 50 MCG/ACT nasal spray Spray 1 spray into each nostril 2 times daily. 1 bottle 5    levothyroxine (SYNTHROID) 88 MCG tablet Take 1 tablet (88 mcg) by mouth every morning (before breakfast). 90 tablet 0    metroNIDAZOLE (METROGEL) 1 % gel Apply 1 Application topically daily. Use a small amount as directed 1 each 11     rosuvastatin (CRESTOR) 10 MG tablet Take 1 tablet (10 mg) by mouth daily. 90 tablet 0    tretinoin (RETIN-A) 0.025 % cream Apply a thin layer at bedtime as directed 1 Tube 3     No current facility-administered medications for this visit.       Patient information: Allergies   Allergen Reactions    Vicodin [Hydrocodone-Acetaminophen] Hallucinations      There are no preventive care reminders to display for this patient.   Last BP:  Blood Pressure   08/21/22 122/61   08/02/22 111/62   01/17/22 121/84      Last labs:  Results for orders placed or performed in visit on 10/07/22   TSH, Blood - See Instructions   Result Value Ref Range    TSH 1.91 0.27 - 4.20 uIU/mL     Lab Results   Component Value Date    CHOL 180 08/02/2022    HDL 76 08/02/2022    LDLCALC 79 08/02/2022    TRIG 123 08/02/2022    TSH 1.91 10/07/2022    A1C 5.8 08/02/2022

## 2022-10-10 ENCOUNTER — Encounter (INDEPENDENT_AMBULATORY_CARE_PROVIDER_SITE_OTHER): Payer: Self-pay | Admitting: Student in an Organized Health Care Education/Training Program

## 2022-10-10 MED ORDER — LEVOTHYROXINE SODIUM 100 MCG OR TABS
100.00 ug | ORAL_TABLET | ORAL | 3 refills | Status: AC
Start: 2022-10-10 — End: ?

## 2022-10-10 MED ORDER — LEVOTHYROXINE SODIUM 88 MCG OR TABS
88.00 ug | ORAL_TABLET | ORAL | 3 refills | Status: AC
Start: 2022-10-10 — End: ?

## 2022-10-13 ENCOUNTER — Emergency Department (HOSPITAL_COMMUNITY): Payer: BLUE CROSS/BLUE SHIELD

## 2022-10-13 ENCOUNTER — Emergency Department
Admission: EM | Admit: 2022-10-13 | Discharge: 2022-10-13 | Disposition: A | Discharge status: Home / Self Care | Payer: BLUE CROSS/BLUE SHIELD | Attending: Emergency Medicine | Admitting: Emergency Medicine

## 2022-10-13 DIAGNOSIS — Z9049 Acquired absence of other specified parts of digestive tract: Secondary | ICD-10-CM | POA: Insufficient documentation

## 2022-10-13 DIAGNOSIS — R1013 Epigastric pain: Secondary | ICD-10-CM | POA: Insufficient documentation

## 2022-10-13 DIAGNOSIS — R112 Nausea with vomiting, unspecified: Secondary | ICD-10-CM | POA: Insufficient documentation

## 2022-10-13 DIAGNOSIS — Z79899 Other long term (current) drug therapy: Secondary | ICD-10-CM | POA: Insufficient documentation

## 2022-10-13 LAB — URINALYSIS WITH CULTURE REFLEX, WHEN INDICATED
Bilirubin: NEGATIVE
Blood: NEGATIVE
Glucose: NEGATIVE
Leuk Esterase: NEGATIVE Leu/uL
Nitrite: NEGATIVE
Specific Gravity: 1.028 (ref 1.002–1.030)
Urobilinogen: NEGATIVE
pH: 7.5 (ref 5.0–8.0)

## 2022-10-13 LAB — ECG 12-LEAD
ATRIAL RATE: 79 {beats}/min
ECG INTERPRETATION: NORMAL
P AXIS: 44 degrees
PR INTERVAL: 152 ms
QRS INTERVAL/DURATION: 82 ms
QT: 386 ms
QTc (Bazett): 442 ms
QTc (Fredericia): 423 ms
R AXIS: 19 degrees
T AXIS: 42 degrees
VENTRICULAR RATE: 79 {beats}/min

## 2022-10-13 LAB — UA, CHEM ONLY POCT
Glucose: NEGATIVE
Leuk Esterase: NEGATIVE
Nitrite: NEGATIVE
Specific Gravity: 1.025 (ref 1.002–1.030)
Urobilinogen: 0.2 (ref 0.2–1)
pH: 7 (ref 5.0–8.0)

## 2022-10-13 LAB — CBC WITH DIFF, BLOOD
ANC-Automated: 6.6 10*3/uL (ref 1.6–7.0)
Abs Basophils: 0 10*3/uL (ref <0.2)
Abs Eosinophils: 0 10*3/uL (ref 0.0–0.5)
Abs Lymphs: 2 10*3/uL (ref 0.8–3.1)
Abs Monos: 0.4 10*3/uL (ref 0.2–0.8)
Basophils: 0.1 %
Eosinophils: 0.1 %
Hct: 38.9 % (ref 34.0–45.0)
Hgb: 13.3 gm/dL (ref 11.2–15.7)
Imm Gran %: 0.3 % (ref <1)
Lymphocytes: 22 %
MCH: 31.4 pg (ref 26.0–32.0)
MCHC: 34.2 g/dL (ref 32.0–36.0)
MCV: 92 um3 (ref 79.0–95.0)
MPV: 11.1 fL (ref 9.4–12.4)
Monocytes: 4.5 %
Plt Count: 185 10*3/uL (ref 140–370)
RBC: 4.23 10*6/uL (ref 3.90–5.20)
RDW: 12 % (ref 12.0–14.0)
Segs: 73 %
WBC: 9.1 10*3/uL (ref 4.0–10.0)

## 2022-10-13 LAB — COMPREHENSIVE METABOLIC PANEL, BLOOD
ALT (SGPT): 26 U/L (ref 0–33)
AST (SGOT): 19 U/L (ref 0–32)
Albumin: 4.4 g/dL (ref 3.5–5.2)
Alkaline Phos: 72 U/L (ref 40–130)
Anion Gap: 16 mmol/L — ABNORMAL HIGH (ref 7–15)
BUN: 16 mg/dL (ref 6–20)
Bicarbonate: 19 mmol/L — ABNORMAL LOW (ref 22–29)
Bilirubin, Tot: 1.06 mg/dL (ref <1.2)
Calcium: 8.8 mg/dL (ref 8.5–10.6)
Chloride: 109 mmol/L — ABNORMAL HIGH (ref 98–107)
Creatinine: 0.69 mg/dL (ref 0.51–0.95)
Glucose: 112 mg/dL — ABNORMAL HIGH (ref 70–99)
Potassium: 3.8 mmol/L (ref 3.5–5.1)
Sodium: 144 mmol/L (ref 136–145)
Total Protein: 6.3 g/dL (ref 6.0–8.0)
eGFR Based on CKD-EPI 2021 Equation: >60 mL/min/{1.73_m2}

## 2022-10-13 LAB — TROPONIN T GEN 5 W/REFLEX TO CK/CKMB
Troponin T Gen 5 w/Reflex CK/CKMB: 6 ng/L (ref <14)
Troponin T Gen 5 w/Reflex CK/CKMB: <6 ng/L (ref <14)
Troponin T Gen 5: <6 ng/L (ref <14)

## 2022-10-13 LAB — LIPASE, BLOOD: Lipase: 30 U/L (ref 13–60)

## 2022-10-13 MED ORDER — KETOROLAC TROMETHAMINE 15 MG/ML IJ SOLN
15.0000 mg | Freq: Once | INTRAMUSCULAR | Status: AC
Start: 2022-10-13 — End: 2022-10-13
  Administered 2022-10-13: 15 mg via INTRAVENOUS
  Filled 2022-10-13: qty 1

## 2022-10-13 MED ORDER — ONDANSETRON HCL 4 MG/2ML IV SOLN
4.0000 mg | Freq: Once | INTRAMUSCULAR | Status: AC
Start: 2022-10-13 — End: 2022-10-13
  Administered 2022-10-13: 4 mg via INTRAVENOUS
  Filled 2022-10-13: qty 2

## 2022-10-13 NOTE — ED Notes (Signed)
Pt able to tolerate PO intake well.

## 2022-10-13 NOTE — EMS Narrative (Addendum)
EMS note for [id number] by [id number], last updated at 12:14 on 2022-10-13  Marolyn Haller [facility]     1898-05-20     56 Years     72.6   kg                      Patient's symptom(s):  R10.84 - Generalized abdominal pain  R11.0 - Nausea                      Paramedic's impression(s):  R10.84 - Generalized abdominal pain                      M7 arrived on scene of an apartment to find a 57 yo female walking towards   [place] with her neighbor at her side. Pt states she had abdominal pain and   vomited one time.  Pt states she was at home when she felt sudden abdominal   pain, about an hour ago. She vomited one time and then called 911.  She is   A/T/Ox3, alert and tracking, ambulatory on scene, breathing full and   effective on room air, vitals stable, skin warm and clammy. Pt states she   had 6/10 abdominal across the middle of her abdomen. She vomited one time   and no longer has abdominal pain. She has mild nausea. She denies any hx of   abdominal problems. She has been eating and drinking normally. She denies   any prior nausea, vomiting, diarrhea, constipation, or recent illness. She   denies any recent trauma or past surgeries. She is afebrile. Secondary   revealed that she had surgery to straighten one of the toes on her left   foot. She has been taking Tylenol every 6 hours.  Unchanged Vitals   monitored, IV, Zofran, fluid bolus. Pt transported code 20 to [employer], per pt request, with no change in pt status or condition.   Turnover to RN in ER. Her purse was transported and left with her in the   ER.                        Patient's Medical History:  E03.9 - Hypothyroidism, unspecified                      Patient's Known Allergies:  [id number] - Vicodin 10/660  [id number] - Gluten sensitivity                      Paramedic Physical Exam:  Skin Assessment: Warm  Skin Assessment: Clammy  Eye (Left) Assessment: 3-mm  Eye (Right) Assessment: 3-mm  Mental Status Assessment: Oriented-Person  Mental Status  Assessment: Oriented-Time  Mental Status Assessment: Oriented-Place  Neurological Assessment: Normal Baseline for Patient                      Paramedic Medications:  11:28 - Ondansetron - Intravenous (IV) 4 Milligrams (mg)  11:40 - Normal saline - Intravenous (IV) 500 Milliliters (ml)                      Paramedic Procedures:  11:24 - 3-Lead ECG Monitored: Yes  11:27 - Intravenous Catheter Inserted: Yes                      EMS Response Details:  EMS responded at 11:12  EMS arrived at 11:20  EMS made patient contact at 11:22

## 2022-10-13 NOTE — ED Notes (Signed)
Pt provided water for PO trial.

## 2022-10-13 NOTE — ED Provider Notes (Signed)
ED Provider Note  [facility] electronic medical record reviewed for pertinent medical history.     Brittany [name] DOB: [birth year] PMD: Brittany [name]     Chief Complaint   Patient presents with   . Abdominal Pain     57 y.o. F BIB ALS from home. Pt had an episode of sudden 7-8/10 abdominal pain across her upper/mid abdomen. Also had an episode of N/V. Denies urinary and bowel symptoms. Pt states abd pain is resolved but still feels nausea. S/p 4th left toe surgery on 09/13/2022.       HPI: Brittany [name] is a 57 year old female who has a past medical history of Hypothyroidism, Infectious mononucleosis (08/19/2006), Insomnia, and Menorrhagia. Woke up w/ a stomach ache, points to her mid-epigastric region.  Sounds relatively mild but says that @ 1100 acutely started to feel "bad".  When asked what this means says she felt clammy and got sweaty and had an episode of vomiting (no blood no coffee grounds) so called EMS.  Abd pain didn't get any worse during this episode and also denies any associated cp, sob, dysuria, hematuria, f/c.  Hasn't had this before.  Currently just says she feels a "tightness" in her mid-epigastrium w/ some associated nausea.          External Data Sources (Select all that apply):      Pertinent Medical History:    PMHx: As above    Past Surgical History:   Procedure Laterality Date   . left wrist tendon repair  05/20/1996   . CHOLECYSTECTOMY, LAP  05/20/1993   . PB REMOVE TONSILS/ADENOIDS,<12 Y/O  05/20/1973   . CHOLECYSTECTOMY  1997   . PB MYOMECTOMY 1-4 MYOMAS 250 GM/< VAGINAL APPR     . wisdom teeth         Family History   Problem Relation Name Age of Onset   . Breast Cancer Mother Brittany [name] - deceased 34   . Cancer Mother Brittany [name] - deceased         Stage 4 breast cancer   . Other Sister Brittany [name] - deceased         Uterine fibroids/hysterectomy   . Crohn's Disease Sister Brittany [name] - deceased    . Cancer Sister Brittany [name] - deceased         Endometrial cancer   . Breast  Cancer Sister Brittany [name] 40   . Hypertension Sister Brittany [name]         Hypertension / obese   . Cancer Sister          Endometrial cancer at 16   . Hypertension Father Mr. Brittany [name] - deceased    . Hypertension Sister     . Cholesterol/Lipid Disorder Sister     . Diabetes Sister Brittany [name]         Adult onset   . Hypertension Sister Martesha [name]    . Thyroid Sister Brittany [name]    . Ovarian Cancer Neg Hx          but sister w/ovarian cysts age 66       Current Outpatient Medications   Medication Instructions   . acetaminophen (TYLENOL) 650 mg, Oral, EVERY 4 HOURS PRN   . azelastine (ASTELIN) 0.1 % nasal spray 1 spray, Each Naris, 2 TIMES DAILY, Use in each nostril as directed   . cetirizine (ZYRTEC) 10 mg, Oral, DAILY   . cyclobenzaprine (FLEXERIL) 5 mg, Oral, 3 TIMES DAILY PRN   .  desvenlafaxine (PRISTIQ) 50 mg, Oral, DAILY   . desvenlafaxine Succinate ER (PRISTIQ) 25 mg, Oral, DAILY   . diclofenac (VOLTAREN) 4 g, Topical, 4 TIMES DAILY PRN   . fluticasone propionate (FLONASE) 50 MCG/ACT nasal spray 1 spray, Each Naris, 2 TIMES DAILY   . levothyroxine (SYNTHROID) 100 mcg, Oral, EVERY OTHER DAY, Take in the morning before breakfast. Alternate with 88 mcg every other day.   . levothyroxine (SYNTHROID) 88 mcg, Oral, EVERY OTHER DAY, Take in the morning before breakfast. Alternate with 100 mcg every other day.   . metroNIDAZOLE (METROGEL) 1 % gel 1 Application., Topical, DAILY, Use a small amount as directed   . rosuvastatin (CRESTOR) 10 mg, Oral, DAILY   . tretinoin (RETIN-A) 0.025 % cream Apply a thin layer at bedtime as directed       Physical Exam  BP 122/79   Pulse 85   Temp 98 F (36.7 C)   Resp 18   Ht 5\' 10"  (1.778 m)   Wt 95.4 kg (210 lb 5.1 oz)   LMP 08/19/2019   SpO2 100%   BMI 30.18 kg/m   Physical Exam  Constitutional:       General: She is not in acute distress.     Appearance: She is well-developed. She is not diaphoretic.   HENT:      Mouth/Throat:      Mouth: Mucous membranes are moist.    Eyes:      Pupils: Pupils are equal, round, and reactive to light.   Cardiovascular:      Rate and Rhythm: Normal rate and regular rhythm.      Heart sounds: No murmur heard.  Pulmonary:      Effort: Pulmonary effort is normal. No respiratory distress.      Breath sounds: Normal breath sounds.   Abdominal:      General: There is no distension.      Palpations: Abdomen is soft.      Tenderness: There is no abdominal tenderness.      Comments: No cvat B   Musculoskeletal:         General: Normal range of motion.      Right lower leg: No edema.      Left lower leg: No edema.   Skin:     General: Skin is warm and dry.   Neurological:      Mental Status: She is alert and oriented to person, place, and time.           Orders/Medications    Orders Placed This Encounter   Procedures   . X-Ray Chest Frontal And Lateral   . CMP   . HIV 1/2 Antibody & P24 Antigen Assay   . CBC w/ Diff Lavender   . Lipase, Blood Green Plasma Separator Tube   . Urinalysis with Culture Reflex, when indicated   . Troponin T Gen 5 w/Reflex to CK/CKMB Green Plasma Separator Tube   . Troponin T Gen 5 w/Reflex to CK/CKMB Green Plasma Separator Tube   . Troponin T Gen 5 w/Reflex to CK/CKMB Green Plasma Separator Tube   . ECG 12 Lead   . UA, Chem Only POCT       Medications   ketOROLAC (TORADOL) injection 15 mg (has no administration in time range)   ondansetron (ZOFRAN) injection 4 mg (has no administration in time range)       Medical Decision Making/Assessment/Plan    Pt here w/ mild abd pain and transient episode of  clamminess/diaphoresis/vomiting since resolved.  Here she is well appearing w/ a benign abd exam for me and reassuring belly labs.  I did consider if this was pts anginal equivalent given pain in mid-epigastrium and diaphoresis and vomiting but her EKG is reassuring and and trops undetectable x3 so doubt this and won't pursue further.  Pt feels better on reassessment and is tolerating po.  At this point I have low suspicion for  concerning intra-abd issue and will defer CT scan at this point but I did counsel pt to return if sxs recur/get worse or she has any other concerns.    ED Course/Updates/Disposition  ED Clinical Impression as of 10/13/22 1646   Epigastric pain   Nausea and vomiting, unspecified vomiting type                      Data Reviewed:  CXR (interpreted by me): negative for acute findings  EKG (interpreted by me): nsr, nml axis, artifact in V5 otherwise no st changes    Risk of Complications and/or Morbidity:               Dellie Catholic, MD  10/13/22 1645

## 2022-10-13 NOTE — ED Notes (Signed)
Bed: L2  Expected date:   Expected time:   Means of arrival: Paramedic Unit  Comments:

## 2022-10-13 NOTE — ED Notes (Signed)
Pt able to self ambulate to restroom w/ no deficits

## 2022-10-14 LAB — HIV 1/2 ANTIBODY & P24 ANTIGEN ASSAY, BLOOD: HIV 1/2 Antibody & P24 Antigen Assay: NONREACTIVE

## 2022-10-21 ENCOUNTER — Ambulatory Visit (INDEPENDENT_AMBULATORY_CARE_PROVIDER_SITE_OTHER): Payer: BLUE CROSS/BLUE SHIELD | Admitting: Student in an Organized Health Care Education/Training Program

## 2022-10-21 ENCOUNTER — Encounter (INDEPENDENT_AMBULATORY_CARE_PROVIDER_SITE_OTHER): Payer: Self-pay | Admitting: Student in an Organized Health Care Education/Training Program

## 2022-10-21 VITALS — BP 100/62 | HR 94 | Temp 97.0°F | Resp 16 | Ht 70.0 in | Wt 195.0 lb

## 2022-10-21 DIAGNOSIS — Z87898 Personal history of other specified conditions: Secondary | ICD-10-CM

## 2022-10-21 DIAGNOSIS — R1013 Epigastric pain: Secondary | ICD-10-CM

## 2022-10-21 DIAGNOSIS — E785 Hyperlipidemia, unspecified: Secondary | ICD-10-CM

## 2022-10-21 MED ORDER — ROSUVASTATIN CALCIUM 10 MG OR TABS
10.0000 mg | ORAL_TABLET | Freq: Every day | ORAL | 2 refills | Status: AC
Start: 2022-10-21 — End: ?

## 2022-10-21 NOTE — Patient Instructions (Signed)
Recommend cutting down on chocolate / sugar and carb intake.  Return for annual next 07/2023.

## 2022-10-21 NOTE — Progress Notes (Signed)
Chief Complaint   Patient presents with    Follow Up     Thyroid medication       Patient's most important issues for today's visit:        10/20/2022     2:56 PM 08/01/2022     4:13 PM 06/28/2021    10:06 PM 10/05/2020     6:35 PM 01/24/2020     3:40 PM 02/09/2019    11:01 AM 08/20/2018    11:04 AM   PATIENT'S MOST IMPORTANT ISSUES   Patient's Most Important Issues For Your Visit Recent visit to ED; review labs Overall health; weight gain Frequent colds; fatigue Thyroid Plantar fasciatis - referral to PT Thyroid meducation; flu shot Fever         SUBJECTIVE::Brittany [name] is 57 year old and here for follow-up.     Was seen in ER on 10/13/22 for epigastric pain. Workup unremarkable. Note reviewed.  No early satiety.      Assessment and Plan:  I personally spent 30 total minutes in face-to-face and non-face-to-face activities related to the patient's visit today, excluding any separately reportable services/procedures.    Brittany [name] was seen today for follow up.    Diagnoses and all orders for this visit:    History of epigastric pain    Hyperlipidemia, unspecified hyperlipidemia type  -     rosuvastatin (CRESTOR) 10 MG tablet; Take 1 tablet (10 mg) by mouth daily.      Epigastric pain resolved. Suspected episode of dyspepsia with nausea and vomiting. Will CTM.    Patient Instructions   Recommend cutting down on chocolate / sugar and carb intake.  Return for annual next 07/2023.        OBJECTIVE:: BP 100/62 (BP Location: Right arm, BP Patient Position: Sitting, BP cuff size: Regular)   Pulse 94   Temp 97 F (36.1 C) (Temporal)   Resp 16   Ht 5\' 10"  (1.778 m)   Wt 88.5 kg (195 lb)   LMP 08/19/2019   SpO2 97%   BMI 27.98 kg/m   Physical Exam  General Appearance:  alert, no distress, pleasant affect, cooperative.  Heart:  normal rate and regular rhythm, no murmurs, clicks, or gallops.  Lungs: clear to auscultation.  Abdomen: BS normal.  Abdomen soft, non-tender.       Labs and chart reviewed.    Lab Results   Component Value Date     WBC 9.1 10/13/2022    RBC 4.23 10/13/2022    HGB 13.3 10/13/2022    HCT 38.9 10/13/2022    MCV 92.0 10/13/2022    MCHC 34.2 10/13/2022    RDW 12.0 10/13/2022    PLT 185 10/13/2022    MPV 11.1 10/13/2022       Lab Results   Component Value Date    NA 144 10/13/2022    K 3.8 10/13/2022    CL 109 (H) 10/13/2022    BICARB 19 (L) 10/13/2022    BUN 16 10/13/2022    CREAT 0.69 10/13/2022    GLU 112 (H) 10/13/2022    [place] 8.8 10/13/2022       Lab Results   Component Value Date    AST 19 10/13/2022    ALT 26 10/13/2022    ALK 72 10/13/2022    TP 6.3 10/13/2022    ALB 4.4 10/13/2022    TBILI 1.06 10/13/2022    DBILI 0.3 (H) 08/02/2022       Lab Results   Component Value Date  CHOL 180 08/02/2022    HDL 76 08/02/2022    LDLCALC 79 08/02/2022    TRIG 123 08/02/2022          Problem list was updated and reviewed today as indicated.    Patient Active Problem List    Diagnosis Date Noted    [place] syndrome 02/03/2022    Inadequate exercise - not at goal 06/29/2021    Chronic midline low back pain without sciatica 10/17/2015    Hyperlipidemia, unspecified hyperlipidemia type 09/08/2015    Impingement syndrome, shoulder, right 05/02/2015    Radial styloid tenosynovitis 05/02/2015    Allergic rhinitis due to pollen 04/11/2014    Gastroesophageal reflux disease without esophagitis 04/11/2014    Bilateral low back pain without sciatica 09/21/2013    Tension headache 12/26/2011    Gluten intolerance 09/17/2011    Dyspepsia 07/15/2011    Intestinal gas excretion 07/15/2011    Right knee pain 03/13/2011    Hearing loss 12/14/2010    Cerumen impaction 12/14/2010    Skin lesion 12/05/2010    Palpitations 10/02/2010    Fatigue 06/15/2010    Depression 07/25/2009    Sebaceous cyst 03/23/2009    Insomnia 11/16/2008    Acquired hypothyroidism 09/22/2008    Right foot pain 08/18/2008    Grave's disease 01/15/2008    Vitamin D deficiency 01/06/2008    Benign neoplasm of skin, site unspecified 08/27/2007       Past medical history, family  history, allergies, and medications reviewed and updated as indicated.    Past Medical History:   Diagnosis Date    Hypothyroidism     h/o hyperthyroidism s/p radioactive iodine    Infectious mononucleosis 08/19/2006    Insomnia     Menorrhagia        Social History:  Social History     Socioeconomic History    Marital status: Divorced   Occupational History    Occupation: Technical brewer   Tobacco Use    Smoking status: Never     Passive exposure: Yes    Smokeless tobacco: Never    Tobacco comments:     2nd hand smoke   Substance and Sexual Activity    Alcohol use: Yes     Types: 2 Glasses of wine per week     Comment: 1 bottle wine /week    Drug use: No    Sexual activity: Not Currently     Partners: Male     Birth control/protection: Condom   Other Topics Concern    Military Service No    Blood Transfusions No    Caffeine Concern No    Occupational Exposure No    Hobby Hazards No    Sleep Concern Yes    Stress Concern Yes    Weight Concern No    Special Diet Yes     Comment: Gluten sensitivity, mother and sister are Celiac    Back Care Yes    Exercises Regularly Yes    Bike Helmet Use Yes    Seat Belt Use Yes    Performs Self-Exams No   Social History Narrative    2019- Divorced and no kids. 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 Yoga and swimming/hiking.        2020: Sister died of endometrial cancer at 11 yo this year. Very precipitous. Mother still alive in [place]-assisted living.        She is youngest-passing has caused much pain  2021: Hikes occasionally. Yoga on her own. Single.      Social Determinants of Health      Exercise is Medicine (EIM)   Intimate Partner Violence: Low Risk  (10/21/2022)    [place] IPV     Have you ever been emotionally or physically abused by your partner or someone important to you?: No     Within the last year have you been hit, slapped, kicked or otherwise physically hurt by someone?: No     Within the last year has anyone forced you to  have sexual activities?: No     Are you afraid of your spouse or partner you listed above?: No       Allergies:  Allergies   Allergen Reactions    Vicodin [Hydrocodone-Acetaminophen] Hallucinations       Current Outpatient Medications   Medication Sig    acetaminophen (TYLENOL) 325 MG tablet Take 2 tablets (650 mg) by mouth every 4 hours as needed for Mild Pain (Pain Score 1-3).    azelastine (ASTELIN) 0.1 % nasal spray Spray 1 spray into each nostril 2 times daily. Use in each nostril as directed    cetirizine (ZYRTEC) 10 MG tablet Take 1 tablet (10 mg) by mouth daily.    cyclobenzaprine (FLEXERIL) 5 MG tablet Take 1 tablet (5 mg) by mouth 3 times daily as needed for Muscle Spasms.    desvenlafaxine (PRISTIQ) 50 MG TB24 Take 1 tablet (50 mg) by mouth daily.    desvenlafaxine Succinate ER (PRISTIQ) 25 MG TB24 Take 1 tablet (25 mg) by mouth daily.    diclofenac (VOLTAREN) 1 % gel Apply 4 g topically 4 times daily as needed (pain).    fluticasone propionate (FLONASE) 50 MCG/ACT nasal spray Spray 1 spray into each nostril 2 times daily.    levothyroxine (SYNTHROID) 100 MCG tablet Take 1 tablet (100 mcg) by mouth every other day. Take in the morning before breakfast. Alternate with 88 mcg every other day.    levothyroxine (SYNTHROID) 88 MCG tablet Take 1 tablet (88 mcg) by mouth every other day. Take in the morning before breakfast. Alternate with 100 mcg every other day.    metroNIDAZOLE (METROGEL) 1 % gel Apply 1 Application topically daily. Use a small amount as directed    rosuvastatin (CRESTOR) 10 MG tablet Take 1 tablet (10 mg) by mouth daily.    tretinoin (RETIN-A) 0.025 % cream Apply a thin layer at bedtime as directed     No current facility-administered medications for this visit.         Barriers to learning assessed: None.  Patient verbalizes understanding and is agreeable to above plan.

## 2022-11-08 ENCOUNTER — Encounter (INDEPENDENT_AMBULATORY_CARE_PROVIDER_SITE_OTHER): Payer: BLUE CROSS/BLUE SHIELD | Admitting: Nurse Practitioner

## 2022-11-12 ENCOUNTER — Other Ambulatory Visit: Payer: BLUE CROSS/BLUE SHIELD

## 2022-11-12 DIAGNOSIS — Z1159 Encounter for screening for other viral diseases: Secondary | ICD-10-CM

## 2022-11-12 NOTE — Interdisciplinary (Signed)
Specimen Verified    2 Patient Identifiers verified    Patients Name Brittany [name]    Age 57 year old   Patient Date of Birth 1965-12-09   Specimen Collected on 11/12/22       Before sending the specimen to the laboratory, specimen collection  transport has been carefully secure and contained, with the adequately affixed label that matches patient identifiers, site, and lab requisition form by Sherril Cong, LVN on November 12, 2022

## 2022-11-13 LAB — COVID-19 CORONAVIRUS DETECTION ASSAY AT ~~LOC~~ LAB: COVID-19 Coronavirus Result: NOT DETECTED

## 2022-11-14 ENCOUNTER — Encounter (INDEPENDENT_AMBULATORY_CARE_PROVIDER_SITE_OTHER): Payer: BLUE CROSS/BLUE SHIELD

## 2022-11-16 ENCOUNTER — Encounter (INDEPENDENT_AMBULATORY_CARE_PROVIDER_SITE_OTHER): Payer: Self-pay | Admitting: Student in an Organized Health Care Education/Training Program

## 2022-11-18 NOTE — Telephone Encounter (Signed)
From: [name]  To: Cherlynn Polo, MD  Sent: 11/16/2022 3:54 PM PDT  Subject: Bone density scan    Hello Dr. Corrinne Eagle,  I would like to request a bone density scan. My mother had Osteoporosis and my sister was recently diagnosed. My low right back and right hip have been sore for a while. Thank you.

## 2022-12-11 ENCOUNTER — Ambulatory Visit (INDEPENDENT_AMBULATORY_CARE_PROVIDER_SITE_OTHER): Payer: BLUE CROSS/BLUE SHIELD | Admitting: Student in an Organized Health Care Education/Training Program

## 2022-12-11 ENCOUNTER — Encounter (INDEPENDENT_AMBULATORY_CARE_PROVIDER_SITE_OTHER): Payer: Self-pay | Admitting: Student in an Organized Health Care Education/Training Program

## 2022-12-11 VITALS — BP 102/68 | HR 83 | Temp 97.3°F | Resp 16 | Ht 69.5 in | Wt 212.0 lb

## 2022-12-11 DIAGNOSIS — Z8262 Family history of osteoporosis: Secondary | ICD-10-CM

## 2022-12-11 DIAGNOSIS — N951 Menopausal and female climacteric states: Secondary | ICD-10-CM

## 2022-12-11 MED ORDER — ESTRADIOL 0.025 MG/24HR TD PTTW
1.0000 | MEDICATED_PATCH | TRANSDERMAL | 1 refills | Status: DC
Start: 2022-12-12 — End: 2023-02-04

## 2022-12-11 NOTE — Patient Instructions (Signed)
Follow up in 2 months if needed for estrogen patch dose adjustment and to follow up bone density results.

## 2022-12-11 NOTE — Progress Notes (Unsigned)
Chief Complaint   Patient presents with    Other     Pt requesting bone density scan , family hx of osteoporosis     Musculoskeletal Problem     Right hip pain       Patient's most important issues for today's visit:        12/10/2022     8:24 AM 10/20/2022     2:56 PM 08/01/2022     4:13 PM 06/28/2021    10:06 PM 10/05/2020     6:35 PM 01/24/2020     3:40 PM 02/09/2019    11:01 AM   PATIENT'S MOST IMPORTANT ISSUES   Patient's Most Important Issues For Your Visit Bone density scan; fatigue Recent visit to ED; review labs Overall health; weight gain Frequent colds; fatigue Thyroid Plantar fasciatis - referral to PT Thyroid meducation; flu shot       SUBJECTIVE::Brittany [name] is 57 year old and here for follow-up.     Requesting DEXA scan d/t fam hx.  Also reports menopausal sxs, including difficulty sleeping, brain fog, hot flashes. She is interested in starting HRT. Is within 5 years of menopause onset.    R low back / hip pain is improving.    Starting to walk and swim again, and this is helping.  Celiac in sister and mother.    Assessment and Plan:  I personally spent 31 total minutes in face-to-face and non-face-to-face activities related to the patient's visit today, excluding interpretation services and any separately reportable services/procedures    Brittany [name] was seen today for other and musculoskeletal problem.    Diagnoses and all orders for this visit:    Family history of osteoporosis  -     X-RAY DEXA (Bone Density) Skeletal with Trabecular Bone Score; Future    Vasomotor symptoms due to menopause  -     estradiol (VIVELLE-DOT) 0.025 MG/24HR patch; Apply 1 patch topically twice a week.  -     progesterone (PROMETRIUM) 100 MG capsule; Take 1 capsule (100 mg) by mouth nightly.      Discussed risks, benefits to hormone replacement including risk of breast cancer, heart disease, stroke, thromboembolic disease. No history of DVT, known or suspected history of breast cancer, known insert or suspected estrogen dependent cancers,  liver disease, atherosclerotic disease or stroke.   Discussed the [name] is to use the lowest dose necessary for the shortest period of time.   Will start HRT as above.    Pt messaged after this visit stating that she takes total of 1,200 international units daily of vit D3 (1,000 international units supplement + 200 mg in [place] made calcium, Mg, Zinc once a day).      Patient Instructions   Follow up in 2 months if needed for estrogen patch dose adjustment and to follow up bone density results.      OBJECTIVE:: BP 102/68 (BP Location: Left arm, BP Patient Position: Sitting, BP cuff size: Large)   Pulse 83   Temp 97.3 F (36.3 C) (Temporal)   Resp 16   Ht 5' 9.5" (1.765 m)   Wt 96.2 kg (212 lb)   LMP 08/19/2019   SpO2 98%   BMI 30.86 kg/m   Physical Exam  General Appearance: alert, no distress, pleasant affect, cooperative.  Heart:  normal rate and regular rhythm, no murmurs, clicks, or gallops.  Lungs: clear to auscultation.  MSK:  No lumbar spinal tenderness or paraspinal tenderness.  Full lumbar flexion without pain.  5/5 strength in  LE b/l.  Negative FABER.  Normal gait, normal heel and toe walk.    Labs and chart reviewed.    Lab Results   Component Value Date    WBC 9.1 10/13/2022    RBC 4.23 10/13/2022    HGB 13.3 10/13/2022    HCT 38.9 10/13/2022    MCV 92.0 10/13/2022    MCHC 34.2 10/13/2022    RDW 12.0 10/13/2022    PLT 185 10/13/2022    MPV 11.1 10/13/2022       Lab Results   Component Value Date    NA 144 10/13/2022    K 3.8 10/13/2022    CL 109 (H) 10/13/2022    BICARB 19 (L) 10/13/2022    BUN 16 10/13/2022    CREAT 0.69 10/13/2022    GLU 112 (H) 10/13/2022    [place] 8.8 10/13/2022       Lab Results   Component Value Date    AST 19 10/13/2022    ALT 26 10/13/2022    ALK 72 10/13/2022    TP 6.3 10/13/2022    ALB 4.4 10/13/2022    TBILI 1.06 10/13/2022    DBILI 0.3 (H) 08/02/2022       Lab Results   Component Value Date    CHOL 180 08/02/2022    HDL 76 08/02/2022    LDLCALC 79 08/02/2022    TRIG 123  08/02/2022          Problem list was updated and reviewed today as indicated.    Patient Active Problem List    Diagnosis Date Noted    Brittany [name] 02/03/2022    Brittany [name] 06/29/2021    Chronic midline low back pain without sciatica 10/17/2015    Hyperlipidemia, unspecified hyperlipidemia type 09/08/2015    Impingement [name], shoulder, right 05/02/2015    Radial styloid tenosynovitis 05/02/2015    Allergic rhinitis due to pollen 04/11/2014    Gastroesophageal reflux disease without esophagitis 04/11/2014    Bilateral low back pain without sciatica 09/21/2013    Tension headache 12/26/2011    Gluten intolerance 09/17/2011    Dyspepsia 07/15/2011    Intestinal gas excretion 07/15/2011    Right knee pain 03/13/2011    Hearing loss 12/14/2010    Cerumen impaction 12/14/2010    Skin lesion 12/05/2010    Palpitations 10/02/2010    Fatigue 06/15/2010    Depression 07/25/2009    Sebaceous cyst 03/23/2009    Insomnia 11/16/2008    Acquired hypothyroidism 09/22/2008    Right foot pain 08/18/2008    Grave's disease 01/15/2008    Vitamin D deficiency 01/06/2008    Benign neoplasm of skin, site unspecified 08/27/2007       Past medical history, family history, allergies, and medications reviewed and updated as indicated.    Past Medical History:   Diagnosis Date    Hypothyroidism     h/o hyperthyroidism s/p radioactive iodine    Infectious mononucleosis 08/19/2006    Insomnia     Menorrhagia        Social History:  Social History     Socioeconomic History    Marital status: Divorced   Occupational History    Occupation: Technical brewer   Tobacco Use    Smoking status: Never     Passive exposure: Yes    Smokeless tobacco: Never    Tobacco comments:     2nd hand smoke   Substance and Sexual Activity    Alcohol use: Yes  Types: 2 Glasses of wine per week     Comment: 1 bottle wine /week    Drug use: No    Sexual activity: Not Currently     Partners: Male     Birth control/protection: Condom   Other  Topics Concern    Military Service No    Blood Transfusions No    Caffeine Concern No    Occupational Exposure No    Hobby Hazards No    Sleep Concern Yes    Stress Concern Yes    Weight Concern No    Special Diet Yes     Comment: Gluten sensitivity, mother and sister are Celiac    Back Care Yes    Exercises Regularly Yes    Bike Helmet Use Yes    Seat Belt Use Yes    Performs Self-Exams No   Social History Narrative    2019- Divorced and no kids. 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 Yoga and swimming/hiking.        2020: Sister died of endometrial cancer at 89 yo this year. Very precipitous. Mother still alive in [place]-assisted living.        She is youngest-passing has caused much pain        2021: Hikes occasionally. Yoga on her own. Single.      Social Determinants of Health     Food Insecurity: No Food Insecurity (12/10/2022)    Hunger Vital Sign     Worried About Running Out of Food in the Last Year: Never true     Ran Out of Food in the Last Year: Never true   Transportation Needs: No Transportation Needs (12/10/2022)    PRAPARE - Transportation     Lack of Transportation (Medical): No     Lack of Transportation (Non-Medical): No    Exercise is Medicine (EIM)   Intimate Partner Violence: Low Risk  (10/21/2022)    [place] IPV     Have you ever been emotionally or physically abused by your partner or someone important to you?: No     Within the last year have you been hit, slapped, kicked or otherwise physically hurt by someone?: No     Within the last year has anyone forced you to have sexual activities?: No     Are you afraid of your spouse or partner you listed above?: No   Housing Stability: Unknown (12/10/2022)    Housing Stability Vital Sign     Unable to Pay for Housing in the Last Year: Patient declined     Number of Places Lived in the Last Year: 1     Unstable Housing in the Last Year: No       Allergies:  Allergies   Allergen Reactions    Vicodin  [Hydrocodone-Acetaminophen] Hallucinations       Current Outpatient Medications   Medication Sig    acetaminophen (TYLENOL) 325 MG tablet Take 2 tablets (650 mg) by mouth every 4 hours as needed for Mild Pain (Pain Score 1-3).    azelastine (ASTELIN) 0.1 % nasal spray Spray 1 spray into each nostril 2 times daily. Use in each nostril as directed    CALCIUM-MAGNESIUM-ZINC PO     cetirizine (ZYRTEC) 10 MG tablet Take 1 tablet (10 mg) by mouth daily.    Cholecalciferol (VITAMIN D3 PO) Take 1,200 Int'l Units by mouth daily.    cyclobenzaprine (FLEXERIL) 5 MG tablet Take 1 tablet (5 mg) by mouth 3 times  daily as needed for Muscle Spasms.    desvenlafaxine (PRISTIQ) 50 MG TB24 Take 1 tablet (50 mg) by mouth daily.    desvenlafaxine Succinate ER (PRISTIQ) 25 MG TB24 Take 1 tablet (25 mg) by mouth daily.    diclofenac (VOLTAREN) 1 % gel Apply 4 g topically 4 times daily as needed (pain).    estradiol (VIVELLE-DOT) 0.025 MG/24HR patch Apply 1 patch topically twice a week.    fluticasone propionate (FLONASE) 50 MCG/ACT nasal spray Spray 1 spray into each nostril 2 times daily.    levothyroxine (SYNTHROID) 100 MCG tablet Take 1 tablet (100 mcg) by mouth every other day. Take in the morning before breakfast. Alternate with 88 mcg every other day.    levothyroxine (SYNTHROID) 88 MCG tablet Take 1 tablet (88 mcg) by mouth every other day. Take in the morning before breakfast. Alternate with 100 mcg every other day.    metroNIDAZOLE (METROGEL) 1 % gel Apply 1 Application topically daily. Use a small amount as directed    progesterone (PROMETRIUM) 100 MG capsule Take 1 capsule (100 mg) by mouth nightly.    rosuvastatin (CRESTOR) 10 MG tablet Take 1 tablet (10 mg) by mouth daily.    tretinoin (RETIN-A) 0.025 % cream Apply a thin layer at bedtime as directed     No current facility-administered medications for this visit.         Barriers to learning assessed: None.  Patient verbalizes understanding and is agreeable to above  plan.

## 2022-12-12 MED ORDER — VITAMIN D3 PO: 1000.00 [IU] | Freq: Every day | ORAL | Status: AC

## 2022-12-12 MED ORDER — CALCIUM-MAGNESIUM-ZINC PO: ORAL | Status: AC

## 2022-12-12 NOTE — Telephone Encounter (Addendum)
Routing to Dr. Corrinne Eagle for review .  Updated patient's medication list.    From: [name]  To: Cherlynn Polo, MD  Sent: 12/11/2022  4:27 PM PDT  Subject: D3 supplement    Hello Dr. Corrinne Eagle,  Pleasure to see you today. Following up on our conversation, I take 1,200 IU daily of NatureMade D3.   One pill of 1,000 plus there's 200 in my CalMagZinc supplement, for a total of 30 mcg.   Thank you,  Marjean Donna

## 2022-12-15 MED ORDER — PROGESTERONE 100 MG PO CAPS
100.0000 mg | ORAL_CAPSULE | Freq: Every evening | ORAL | 3 refills | Status: AC
Start: 2022-12-15 — End: ?

## 2022-12-17 NOTE — Addendum Note (Signed)
Addended by: Lexine Baton on: 12/17/2022 10:33 AM     Modules accepted: Level of Service

## 2023-01-01 ENCOUNTER — Other Ambulatory Visit (INDEPENDENT_AMBULATORY_CARE_PROVIDER_SITE_OTHER): Payer: Self-pay | Admitting: Student in an Organized Health Care Education/Training Program

## 2023-01-01 DIAGNOSIS — E039 Hypothyroidism, unspecified: Secondary | ICD-10-CM

## 2023-01-15 ENCOUNTER — Encounter (INDEPENDENT_AMBULATORY_CARE_PROVIDER_SITE_OTHER): Payer: Self-pay | Admitting: Hospital

## 2023-01-23 ENCOUNTER — Ambulatory Visit (INDEPENDENT_AMBULATORY_CARE_PROVIDER_SITE_OTHER): Payer: BLUE CROSS/BLUE SHIELD | Admitting: Glaucoma Specialist

## 2023-01-28 ENCOUNTER — Ambulatory Visit (INDEPENDENT_AMBULATORY_CARE_PROVIDER_SITE_OTHER): Payer: BLUE CROSS/BLUE SHIELD | Admitting: Optometrist

## 2023-01-28 ENCOUNTER — Encounter (INDEPENDENT_AMBULATORY_CARE_PROVIDER_SITE_OTHER): Payer: BLUE CROSS/BLUE SHIELD | Admitting: Optometrist

## 2023-01-28 ENCOUNTER — Encounter (INDEPENDENT_AMBULATORY_CARE_PROVIDER_SITE_OTHER): Payer: Self-pay | Admitting: Internal Medicine

## 2023-01-28 DIAGNOSIS — H2513 Age-related nuclear cataract, bilateral: Secondary | ICD-10-CM

## 2023-01-28 DIAGNOSIS — H35412 Lattice degeneration of retina, left eye: Secondary | ICD-10-CM

## 2023-01-28 DIAGNOSIS — Z0000 Encounter for general adult medical examination without abnormal findings: Secondary | ICD-10-CM

## 2023-01-28 DIAGNOSIS — H40003 Preglaucoma, unspecified, bilateral: Secondary | ICD-10-CM

## 2023-01-28 NOTE — Progress Notes (Signed)
HPI: New GLC Suspect  - GLC DFE 01/28/2023    Past Ocular History:  OD OS   (+) GLC Suspect  (+) Mild Cataracts (+) GLC Suspect  (+) Mild Cataracts  (+) Retinal Lattice       Tmax   < 22    22    Ttarget < -    -    Pachy < 544    558       Glaucoma Risk Factors  IOP>22, Family HX, Disc appearance , and High myopia (>6D)    Discontinued Medications/Allergies  Vicodin    Family Ocular History  (+) GLC - Mother, Father, Sister  (+) RD - Sister    Relevant Past Medical History  hyperlipidemia, hypothyroid, arthritis, pre-diabetes  _________________________________________________________________    Glaucoma Testing  Stereo Disc Photos (01/28/23)  OD: Full rim, no DH   OS: IT thin rim, ST slope, scleral crescent, no DH     EXTENDED OPHTHALMOSCOPY AND DRAWING INTERPRETATION  (01/28/2023)  OD: No DH. Retina flat.  OS: No DH. Retina flat.     _________________________________________________________________      Assessment and Plan    Glaucoma Suspect OU (IOP, +FHx, ON, Myopia)  Current medications: Refresh ATs prn    No GLC gtts   IOP acceptable for now OD, acceptable for now OS  Clinical Exam c/w suspicious ON OS > OD   Needs add'l testing  Pt edu on findings.    Plan: Monitor for now     RTC in 2-3 months with HVF 24-2 OU, OCT OU, and IOP Check      Trace Cataracts  - Pt edu on findings.  - Not visually significant at this time.  - Update MRx in optom clinic as needed. Monitor.    Peripheral CRA / Pavingstones OU  Retinal Lattice OS  PVD OS, non-acute  - Pt edu on findings  - Monitor    RD precautions given, rtc STAT if occurs  1. Increase in flashing lights  2. Increase in floaters  3. Curtain or shadow over vision

## 2023-01-28 NOTE — Progress Notes (Signed)
This encounter was opened in error.  Please disregard.

## 2023-02-04 ENCOUNTER — Telehealth (INDEPENDENT_AMBULATORY_CARE_PROVIDER_SITE_OTHER): Payer: Self-pay | Admitting: Student in an Organized Health Care Education/Training Program

## 2023-02-04 DIAGNOSIS — N951 Menopausal and female climacteric states: Secondary | ICD-10-CM

## 2023-02-05 NOTE — Telephone Encounter (Addendum)
[facility]      Brittany [name], CPhT  (Rx Refill and [facility])      Hormone Refill Protocol    Last visit in enc specialty: 12/11/2022     Recent Visits in This Encounter Department       Date Provider Department Visit Type Primary Dx    12/11/2022 Diamantina Monks, MD [place] [facility] - [facility] Internal Medicine Office Visit Family history of osteoporosis    10/21/2022 Hakim, Billee Cashing, MD [place] [facility] - [facility] Internal Medicine Office Visit History of epigastric pain    08/02/2022 Hakim, Billee Cashing, MD [place] [facility] - [facility] Internal Medicine Office Visit Encounter for routine adult medical examination    06/29/2021 Hakim, Billee Cashing, MD [place] [facility] - [facility] Internal Medicine Office Visit Allergic rhinitis, unspecified seasonality, unspecified trigger    10/06/2020 Hakim, Billee Cashing, MD [place] [facility] - [facility] Internal Medicine Office Visit Acquired hypothyroidism           Population Health Visits  Recent [facility] Visits    None       Next f/u appt due:  Follow up in 2 months if needed for estrogen patch dose adjustment and to follow up bone density results.   Next appt in enc specialty: Visit date not found      Future Appointments 02/05/2023 - 02/04/2028        Date Visit Type Department Provider     02/21/2023  4:00 PM X-RAY DEXA TRABECULAR SCORE KOP BONE DENSITY KOP DEXA    Appointment Notes:     Scan to determine if have osteoporosis (family history)             04/30/2023  3:30 PM DIG SCREENING MAMMO BILAT KOP MAMMOGRAPHY KOP MAMMO 1    Appointment Notes:     Mammogram             05/01/2023  8:30 AM [facility] VISUAL FIELD [place] [facility] Field, Visual    Appointment Notes:     Return in about 3 months (around 04/29/2023) for HVF 24-2, OCT, IOP Check on my thurs glc clinic.             05/01/2023  9:30 AM RETURN OPHTHALMOLOGY [place] [facility] Kris Hartmann, OD    Appointment Notes:     Return in about 3 months (around 04/29/2023) for HVF 24-2, OCT,  IOP Check on my thurs glc clinic.             05/07/2023 10:30 AM RETURN GEN DERM [place] [facility] Natasha Bence, MD    Appointment Notes:     TBSE             07/01/2023 10:30 AM RETURN WELL WOMAN EXAM [place] [facility] Deak, Marcelene Butte, MD    Appointment Notes:     WWE, PT aware of visitor policy                    Per OV note on 12/11/2022:  HPI:   A/P: Vasomotor symptoms due to menopause  -     estradiol (VIVELLE-DOT) 0.025 MG/24HR patch; Apply 1 patch topically twice a week.  -     progesterone (PROMETRIUM) 100 MG capsule; Take 1 capsule (100 mg) by mouth nightly.  Discussed risks, benefits to hormone replacement including risk of breast cancer, heart disease, stroke, thromboembolic disease.  No history of DVT, known or suspected history of breast cancer, known insert or suspected estrogen dependent cancers, liver disease, atherosclerotic disease or stroke.   Discussed the goal is to use the lowest dose necessary for the shortest period of time.   Will start HRT as above      LABS required:  (none- lipid panel removed 09/08/18)      Monitoring required:     (Q yr Age:  (Systemic) FYI to PCP at 57yo, hard stop w FYI to PCP at 57yo)  (Q yr BP)  (Q 2 year mammogram - Topical tx excluded)   (Pap smear per HM or guidelines, see below)    General pap smear guidelines (Note: Use HM tab, unless not available, then follow GLs below):  For patients who have not had a total abdominal hysterectomy (TAH):  Age <21 -- (not required)  Age   45-30 -- q 3 yrs   Age   37-65 -- q 3 yrs unless HPV co-testing (-) then q 5 yrs   Age >56 --  (not required)      Pt age:  57 year old    (BP range: Systolic=90-150  GUYQIHKVQ=25-95)  Blood Pressure   12/11/22 102/68   10/21/22 100/62   10/13/22 150/85         Formulation:   Patch (systemic):  MMG required    Last mammogram: 04/25/2022  Result:  BI-RADS Category 1: Negative   Next due per HM:  04/25/2024    Last pap smear: 02/01/2020   Result:  No  Atypical or Malignant Cells   Next due per HM:  01/31/2025      For pharmacist: Is pt on appropriate progesterone tx, if required? yes  ([facility]ring requires PG, eString does NOT)

## 2023-02-07 ENCOUNTER — Encounter (INDEPENDENT_AMBULATORY_CARE_PROVIDER_SITE_OTHER): Payer: Self-pay | Admitting: Student in an Organized Health Care Education/Training Program

## 2023-02-07 MED ORDER — ESTRADIOL 0.025 MG/24HR TD PTTW
1.0000 | MEDICATED_PATCH | TRANSDERMAL | 0 refills | Status: DC
Start: 2023-02-10 — End: 2023-05-02

## 2023-02-07 NOTE — Telephone Encounter (Signed)
From: [name]  To: Cherlynn Polo, MD  Sent: 02/07/2023 7:41 AM PDT  Subject: Recent renewal request for Estradiol    Hello De. Hakim,    Checking if my recent prescription renewal has been approved for: estradiol (VIVELLE-DOT) 0.025 MG/24HR patch Cherlynn Polo, MD]   Patient Comment: Please renew  I have damaged two patches and only have 1 left.  Thank you. Marjean Donna

## 2023-02-07 NOTE — Telephone Encounter (Signed)
Scheduling Request:   [facility]     Please remind pt to schedule f/u appt w Dr Corrinne Eagle, Billee Cashing, for Follow up in 2 months if needed for estrogen patch dose adjustment and to follow up bone density results.  (video visit if appropriate), due on or after 02/11/2023.     *Please inform pt to wait to complete any ordered labs until AFTER their scheduled visit, unless otherwise instructed by their provider*    Authorized 90 days supply + 0 RF until f/u scheduled.

## 2023-02-12 ENCOUNTER — Other Ambulatory Visit (INDEPENDENT_AMBULATORY_CARE_PROVIDER_SITE_OTHER): Payer: Self-pay | Admitting: Obstetrics & Gynecology

## 2023-02-12 DIAGNOSIS — N951 Menopausal and female climacteric states: Secondary | ICD-10-CM

## 2023-02-13 MED ORDER — DESVENLAFAXINE SUCCINATE 50 MG OR TB24: 50.0000 mg | ORAL_TABLET | Freq: Every day | ORAL | 4 refills | Status: DC

## 2023-02-13 MED ORDER — DESVENLAFAXINE SUCCINATE ER 25 MG PO TB24: 25.0000 mg | ORAL_TABLET | Freq: Every day | ORAL | 4 refills | Status: DC

## 2023-02-13 NOTE — Telephone Encounter (Signed)
Pt's last visit was on 08/31/20236 and there is a future appointment on 07/01/2023. Pt is requesting a refill for the Pristiq. Please review    Thank you kindly, Loraine Leriche

## 2023-02-17 ENCOUNTER — Other Ambulatory Visit (INDEPENDENT_AMBULATORY_CARE_PROVIDER_SITE_OTHER): Payer: Self-pay | Admitting: Student in an Organized Health Care Education/Training Program

## 2023-02-17 DIAGNOSIS — J301 Allergic rhinitis due to pollen: Secondary | ICD-10-CM

## 2023-02-19 MED ORDER — CETIRIZINE HCL 10 MG OR TABS
10.0000 mg | ORAL_TABLET | Freq: Every day | ORAL | 3 refills | Status: DC
Start: 2023-02-19 — End: 2024-02-20

## 2023-02-19 NOTE — Telephone Encounter (Signed)
OTC Refill Protocol    Last visit in enc specialty: 12/11/2022     Recent Visits in This Encounter Department       Date Provider Department Visit Type Primary Dx    12/11/2022 Diamantina Monks, MD [place] [facility] - [facility] Internal Medicine Office Visit Family history of osteoporosis    10/21/2022 Hakim, Billee Cashing, MD [place] [facility] - [facility] Internal Medicine Office Visit History of epigastric pain    08/02/2022 Hakim, Billee Cashing, MD [place] [facility] - [facility] Internal Medicine Office Visit Encounter for routine adult medical examination    06/29/2021 Hakim, Billee Cashing, MD [place] [facility] - [facility] Internal Medicine Office Visit Allergic rhinitis, unspecified seasonality, unspecified trigger    10/06/2020 Hakim, Billee Cashing, MD [place] [facility] - [facility] Internal Medicine Office Visit Acquired hypothyroidism           Population Health Visits  Recent [facility] Visits    None       Next appt in enc specialty: 05/02/2023      Future Appointments 02/19/2023 - 02/18/2028        Date Visit Type Department Provider     02/21/2023  4:00 PM X-RAY DEXA TRABECULAR SCORE KOP BONE DENSITY KOP DEXA    Appointment Notes:     Scan to determine if have osteoporosis (family history)             04/30/2023  3:30 PM DIG SCREENING MAMMO BILAT KOP MAMMOGRAPHY KOP MAMMO 1    Appointment Notes:     Mammogram             05/01/2023  8:30 AM HUMPHREY VISUAL FIELD [place] [facility] Field, Visual    Appointment Notes:     Return in about 3 months (around 04/29/2023) for HVF 24-2, OCT, IOP Check on my thurs glc clinic.             05/01/2023  9:30 AM RETURN OPHTHALMOLOGY [place] [facility] Kris Hartmann, OD    Appointment Notes:     Return in about 3 months (around 04/29/2023) for HVF 24-2, OCT, IOP Check on my thurs glc clinic.             05/02/2023 11:30 AM RETURN PRIMARY CARE PATIENT  [place] [facility] - [facility] Internal Medicine Hakim, Billee Cashing, MD    Appointment Notes:     Follow up  re: hormone replacement therapy             05/07/2023 10:30 AM RETURN GEN DERM [place] [facility] U[facility] Natasha Bence, MD    Appointment Notes:     TBSE             07/01/2023 10:30 AM RETURN WELL WOMAN EXAM [place] [facility] Deak, Marcelene Butte, MD    Appointment Notes:     WWE, PT aware of visitor policy                        **No recent notes or labs reviewed per pharmacy dept protocol**

## 2023-02-21 ENCOUNTER — Ambulatory Visit
Admission: RE | Admit: 2023-02-21 | Discharge: 2023-02-21 | Disposition: A | Discharge status: Home / Self Care | Payer: BLUE CROSS/BLUE SHIELD | Attending: Student in an Organized Health Care Education/Training Program | Admitting: Student in an Organized Health Care Education/Training Program

## 2023-02-21 DIAGNOSIS — Z8262 Family history of osteoporosis: Secondary | ICD-10-CM | POA: Insufficient documentation

## 2023-02-21 DIAGNOSIS — M8580 Other specified disorders of bone density and structure, unspecified site: Secondary | ICD-10-CM | POA: Insufficient documentation

## 2023-02-21 DIAGNOSIS — Z13820 Encounter for screening for osteoporosis: Secondary | ICD-10-CM | POA: Insufficient documentation

## 2023-02-21 DIAGNOSIS — Z780 Asymptomatic menopausal state: Secondary | ICD-10-CM | POA: Insufficient documentation

## 2023-02-24 ENCOUNTER — Encounter (INDEPENDENT_AMBULATORY_CARE_PROVIDER_SITE_OTHER): Payer: Self-pay | Admitting: Dermatology

## 2023-02-25 NOTE — Telephone Encounter (Signed)
From: [name]  To: Natasha Bence, MD  Sent: 02/24/2023 3:04 PM PDT  Subject: Mole on hand changed color and symmetry    Hello Dr. Sheral Apley,  My annual check-up isn't until December, but the mole on my thumb, shown in attached photo, went from flat and light brown to raised edges and red over the past 2 months. It looks darker in person than in the photo. Please advise. Thank you. Brittany [name]

## 2023-03-03 ENCOUNTER — Encounter (INDEPENDENT_AMBULATORY_CARE_PROVIDER_SITE_OTHER): Payer: Self-pay | Admitting: Dermatology

## 2023-03-03 DIAGNOSIS — Z1283 Encounter for screening for malignant neoplasm of skin: Secondary | ICD-10-CM

## 2023-03-05 ENCOUNTER — Ambulatory Visit (INDEPENDENT_AMBULATORY_CARE_PROVIDER_SITE_OTHER): Payer: BLUE CROSS/BLUE SHIELD | Admitting: Dermatology

## 2023-03-11 NOTE — Progress Notes (Signed)
[facility] - [facility]   [address] [facility] [place], [place] [address]  [phone]     Primary MD: Cherlynn Polo Nabil  Consult Requested By: Natasha Bence    Chief complaint: Spot check        HISTORY:  Brittany [name] is a 57 year old female with no personal history of skin cancer here for spot check evaluation.    LOV 05/01/2022 with Dr. Sheral Apley (new to me): benign exam    Lesion of concern today:   - Patient brings attention to a spot on her right base of thumb that had been present for years. Notes the spot blistered up, scabbed over, and fell off. Denies She applied arnica gel on the lesion.   - She points out a spot on her left arm that she is unsure if worrisome.     Past Derm History:  Melanoma hx: negative   NMSC hx: negative     Family History:  Family Hx of melanoma: positive - per patient, father with one melanoma on his back  (reported 03/12/23)  Family Hx of NMSC: positive - mother had 3 NMSC (she did not use sunscreen), father with 1 NMSC     Social History:  Works in Occupational hygienist for [employer]: 1 major sunburn with peeling as a child   Sun Protection:  intermittent    Past Medical History:  has a past medical history of Hypothyroidism, Infectious mononucleosis (08/19/2006), Insomnia, and Menorrhagia.    She has no past medical history of AF (atrial fibrillation) (CMS-HCC), Asthma, Chronic kidney disease (CKD), Chronic obstructive airway disease (CMS-HCC), Congestive heart failure (CMS-HCC), or Hypertension.     Current Medications: has a current medication list which includes the following prescription(s): acetaminophen, azelastine, calcium-magnesium-zinc, cetirizine, cholecalciferol, cyclobenzaprine, desvenlafaxine, desvenlafaxine succinate er, diclofenac, estradiol, fluticasone propionate, levothyroxine, levothyroxine, metronidazole, progesterone, rosuvastatin, and tretinoin.     Allergies: is allergic to vicodin [hydrocodone-acetaminophen].     PHYSICAL EXAM:  General  appearance: well-developed   Neuro: alert and oriented  Psych: mood and affect within normal limits  Skin: I examined the following during the visit: hands, upper extremities     - right radial dorsal hand (A) changing, new 6 mm macule with episode of peeling, was pink and elevated now presenting more macular - unclear etiology  - well circumscribed brown waxy, "stuck-on"-appearing papules on the upper extremities   -- including lichenoid keratoses on left upper arm   - dyschromia, thinning, loss of elasticity, and wrinkling of the skin in sun exposed areas    ASSESSMENT AND TREATMENT PLAN:    Neoplasm of uncertain behavior   PROCEDURE NOTE:  shave removal     Site (A): Right radial dorsal hand - changing new lesion r/o atypia vs carcinoma vs inflamed benign lesion    Size: 6 mm      Time Out: performed.  Correct patient: Yes  Correct site: Yes (Pt confirmed location)    Written and verbal, witnessed informed consent was obtained. Patient was informed of the risks of infection, pain, bleeding, and scar.   Each biopsy site was prepped with an alcohol pad.  Each area was infiltrated with 0.1 cc of 0.5% lidocaine with 1:200,000 epinephrine.  A shave removal was performed using a dermablade.  Hemostasis was achieved.  The site(s) was allowed to heal by secondary intention.  The patient was advised to clean each area with soap and water, apply petrolatum ointment afterwards, and to cover with a bandaid twice  daily.  Routine wound care instructions were reviewed with the patient. Will message patient over MyChart with PDF of formal pathology report. Patient advised to call our office if he/she does not receive a MyChart message or call with results within 2 weeks of biopsy. Patient verbalizes understanding. Patient was advised to call our office for any complications.    Patient entered photo from 03/03/2023 via MyChart:      Photos from today's visit:       Lichenoid keratosis   Seborrheic keratoses  - Discussed the  benign etiology of these lesions. Treatment is for cosmetic reasons and includes electrosurgery, cryosurgery, shave removal, dermabrasion, laser therapy, and oral isotretinoin. No further treatment is indicated today.    Sun damaged skin and Skin health maintenance  Health education provided of sun protective measures   - Sun avoidance during peak hours (10am - 4pm)  - Wearing protective clothing (long sleeve shirt, wide brim hat)  - Application of broad spectrum sunscreen with SPF 30 or greater with frequent reapplication (every 1 to 1.5 hours, or more frequently if in water or if heavy sweating).  Best sunscreens use zinc or titanium oxide as the active ingredient and act as a physical blocker.      Return for appointment already scheduled on 05/07/2023 with Dr. Sheral Apley.     Scribe Attestation  The notes I am recording reflect only actions made by and judgments taken by this provider, Penne Lash DNP, FNP-BC, for whom I am scribing today. I have performed no independent clinical work.    Kathrina Silvino  ____________________________________________________________________    Penne Lash, DNP, FNP-BC  [facility]

## 2023-03-12 ENCOUNTER — Ambulatory Visit: Payer: Self-pay

## 2023-03-12 ENCOUNTER — Ambulatory Visit (INDEPENDENT_AMBULATORY_CARE_PROVIDER_SITE_OTHER): Payer: BLUE CROSS/BLUE SHIELD | Admitting: Dermatology

## 2023-03-12 DIAGNOSIS — D492 Neoplasm of unspecified behavior of bone, soft tissue, and skin: Secondary | ICD-10-CM

## 2023-03-12 DIAGNOSIS — Z719 Counseling, unspecified: Secondary | ICD-10-CM

## 2023-03-12 DIAGNOSIS — Z23 Encounter for immunization: Secondary | ICD-10-CM

## 2023-03-12 DIAGNOSIS — L821 Other seborrheic keratosis: Secondary | ICD-10-CM

## 2023-03-12 DIAGNOSIS — L820 Inflamed seborrheic keratosis: Secondary | ICD-10-CM

## 2023-03-12 DIAGNOSIS — L578 Other skin changes due to chronic exposure to nonionizing radiation: Secondary | ICD-10-CM

## 2023-03-12 DIAGNOSIS — D485 Neoplasm of uncertain behavior of skin: Secondary | ICD-10-CM

## 2023-03-12 NOTE — Patient Instructions (Signed)
Surgery and Biopsy Site Care       What Happened?   You had a skin biopsy. One or more samples of your skin were taken for testing.       What should I expect? When should I call?         Normal:    Tenderness or mild pain at the site for a few days   Clear yellow or pink drainage            Urgent:    Call your clinic     After-hours call the hospital operator at [phone]. Ask for the Dermatology resident on call.      Pain at your treatment site that gets worse without a reason for it to get worse (e.g., clothing rubbing)   Cloudy yellow drainage. Redness spreading beyond the biopsy site(s).    Fever (>100.47F or >38C)   Bleeding at the site that does not stop with pressure (see below for more details)          Emergency:    Call 911    Severe pain   Heavy, uncontrolled bleeding          My Care After a Skin Biopsy   Keep your bandage dry and in place for 24 hours. If the bandage gets wet or falls off, it is OK to reapply emollient (e.g., Vaseline or Aquaphor) and put on a new bandage.    After 24 hours, it is OK to shower or bathe and remove the bandage. Wash your biopsy site daily with a mild non-fragranced soap and water and then pat dry.    Apply an emollient such as petroleum jelly (e.g., Vaseline). Unless advised otherwise, application of an antibiotic ointment is typically not necessary.    Cover with a bandage to protect your clothing and absorb any drainage.   For pain or discomfort, we recommend acetaminophen (Tylenol) as directed on bottle unless otherwise instructed by your care team.    If your biopsy site starts bleeding:   Apply firm direct pressure with a clean cloth or gauze for 15 minutes.    After 15 minutes, check the biopsy site.   If bleeding has slowed, apply pressure for 15 more minutes and then it should stop. If still bleeding to the point of soaking the cloth or gauze, contact the clinic using the phone numbers below or go to an urgent care or emergency department.       Contact  Information   For questions or concerns, please send a MyChart message or call:   Monday to Friday from 8:00 am to 5:00 pm:   [place] office: [phone]   [place] office: [phone]       For urgent after-hours concerns, please call:    Nights and weekends: [phone]. Ask for the Dermatology resident on call.

## 2023-03-14 ENCOUNTER — Other Ambulatory Visit: Payer: BLUE CROSS/BLUE SHIELD

## 2023-03-14 DIAGNOSIS — Z1159 Encounter for screening for other viral diseases: Secondary | ICD-10-CM

## 2023-03-14 NOTE — Interdisciplinary (Signed)
Verified patient with two identifiers.   Nasal specimen obtained from both nostrils from patient per order and submitted to lab for processing.   Patient tolerated well.   results will be sent via MyChart or MyStudentChart to patient.

## 2023-03-15 LAB — COVID-19 CORONAVIRUS DETECTION ASSAY AT ~~LOC~~ LAB: COVID-19 Coronavirus Result: NOT DETECTED

## 2023-03-18 ENCOUNTER — Encounter (INDEPENDENT_AMBULATORY_CARE_PROVIDER_SITE_OTHER): Payer: Self-pay | Admitting: Dermatology

## 2023-04-07 NOTE — Progress Notes (Addendum)
HPI: New GLC Suspect (Part II)  - Here for HVF 24-2 OU, OCT OU, and IOP Check   - GLC DFE 01/28/2023    Past Ocular History:  OD OS   (+) GLC Suspect  (+) Mild Cataracts (+) GLC Suspect  (+) Mild Cataracts  (+) Retinal Lattice       Tmax   < 22    22    Ttarget < -    -    Pachy < 544    558       Glaucoma Risk Factors  IOP>22, Family HX, Disc appearance , and High myopia (>6D)    Discontinued Medications/Allergies  Vicodin    Family Ocular History  (+) GLC - Mother, Father, Sister  (+) RD - Sister    Relevant Past Medical History  hyperlipidemia, hypothyroid, arthritis, pre-diabetes  _________________________________________________________________    Glaucoma Testing    HVF  (05/01/2023)  OD (24-2, stim III): Borderline reliable, full, Inferior scatter, baseline. FL 2/10. FP 10%.  OS (24-2, stim III): Borderline reliable, full, Inferior scatter, baseline. FP 14%.     OCT RNFL  (05/01/2023)  OD: Superior  Thinning, poor signal, baseline. ?myopic temp bundle shift  OS: Full, baseline. ?myopic temp bundle shift    OCT GCIPL  (05/01/2023)  OD: Borderline, Thinning, baseline.  OS: Borderline, Thinning, baseline.  _____________________________________________________________    Prev Testing  Stereo Disc Photos (01/28/23)  OD: Full rim, no DH   OS: IT thin rim, ST slope, scleral crescent, no DH     EXTENDED OPHTHALMOSCOPY AND DRAWING INTERPRETATION  (01/28/2023)  OD: No DH. Retina flat.  OS: No DH. Retina flat.     _________________________________________________________________      Assessment and Plan    Glaucoma Suspect OU (IOP, +FHx, ON, Myopia)  Current medications: Refresh ATs prn    No GLC gtts   IOP acceptable for now OD, acceptable for now OS  Clinical Exam / OCT c/w suspicious ON OU.  However, baseline HVF rel full and  poss myopic temp bundle shift.   Pt edu on findings.     Plan: Monitor for now     RTC in 6 months with HVF 24-2 OU, OCT OU, and IOP Check      Trace Cataracts  - Pt edu on findings.  - Not  visually significant at this time.  - Update MRx in optom clinic as needed. Monitor.    Peripheral CRA / Pavingstones OU  Retinal Lattice OS  PVD OS, non-acute  - Pt edu on findings  - Monitor    RD precautions given, rtc STAT if occurs  1. Increase in flashing lights  2. Increase in floaters  3. Curtain or shadow over vision

## 2023-04-12 ENCOUNTER — Ambulatory Visit (INDEPENDENT_AMBULATORY_CARE_PROVIDER_SITE_OTHER): Payer: BLUE CROSS/BLUE SHIELD

## 2023-04-12 VITALS — BP 115/62 | HR 85 | Temp 98.5°F | Resp 18

## 2023-04-12 DIAGNOSIS — J069 Acute upper respiratory infection, unspecified: Secondary | ICD-10-CM

## 2023-04-12 MED ORDER — ALBUTEROL SULFATE 108 (90 BASE) MCG/ACT IN AERS
2.0000 | INHALATION_SPRAY | Freq: Four times a day (QID) | RESPIRATORY_TRACT | 0 refills | Status: AC | PRN
Start: 2023-04-12 — End: ?

## 2023-04-12 MED ORDER — DOXYCYCLINE HYCLATE 100 MG OR CAPS
100.0000 mg | ORAL_CAPSULE | Freq: Two times a day (BID) | ORAL | 0 refills | Status: AC
Start: 2023-04-12 — End: 2023-04-19

## 2023-04-12 NOTE — Progress Notes (Signed)
Chief Complaint:   Chief Complaint   Patient presents with    URI     Started 5 days ago, fatigue, cold, congestion, cough, did covid and flu test and was negative       Case summary is as follows: Brittany [name] is a 57 year old female with chief complaint of nasal congestion, cough, fatigue and generalized weakness. Has had symptoms for the last five days. Was taking Robitussin without any significant improvement.   Has had some sick contacts at work. Went to a performance over the weekend and someone was coughing there and thought she got sick there. Denies any chest pain, nausea, vomiting, diarrhea, lightheadedness or dizziness.     Past Medical History:   Diagnosis Date    Hypothyroidism     h/o hyperthyroidism s/p radioactive iodine    Infectious mononucleosis 08/19/2006    Insomnia     Menorrhagia      Past Surgical History:   Procedure Laterality Date    left wrist tendon repair  05/20/1996    CHOLECYSTECTOMY, LAP  05/20/1993    PB REMOVE TONSILS/ADENOIDS,<12 Y/O  05/20/1973    CHOLECYSTECTOMY  1997    PB MYOMECTOMY 1-4 MYOMAS 250 GM/< VAGINAL APPR      wisdom teeth         ROS negative except where documented in HPI    Physical Examination:    04/12/23  1316   BP: 115/62   Pulse: 85   Temp: 95.5 F (35.3 C)   Resp: 18   SpO2: 96%     Vital signs noted and stable.     General: Alert, no acute distress, patient is resting comfortably in exam chair  Skin: Warm, dry, normal for ethnicity, no rashes, bruising or wounds  Head: Normocephalic, atraumatic  EENT: PERRLA, EOMI, oral mucosa moist, oropharynx noted to have mild erythema without exudate. Uvula is midline. Pt is able to clear all secretions without trismus or drooling. TM's noted to have bilateral serous effusions. No mastoid TTP bilaterally.   Neck: Supple, trachea midline. No meningismus   Cardiovascular: Regular rate, rhythm, no M/G/R, normal peripheral perfusion, no edema  Respiratory: Lungs CTAB, Non-labored respirations, BS equal, symmetrical  expansion, no increased work of breathing, no stridor, no wheezing   Chest wall: No tenderness, no deformity  Abdomen: Soft, non-tender, non-distended, normoactive bowel sounds, no organomegaly  Lymphatics: No lymphadenopathy     Impression:    ICD-10-CM ICD-9-CM    1. Upper respiratory tract infection, unspecified type  J06.9 465.9 doxyCYCLINE (VIBRAMYCIN) 100 MG capsule      albuterol (PROVENTIL HFA) 108 (90 Base) MCG/ACT inhaler          Plan:   Patient presents today for evaluation of cough, nasal congestion and sore throat. HPI and physical exam as above. On exam, pt is afebrile, nontoxic appearing and in no acute distress. Vitals are stable and reassuring. Lungs CTAB without any respiratory distress and oxygen levels maintained above 95% on RA and no signs of hypoxia. No signs of infection on otic exam or examination of the oropharynx and low suspicion for any OAM, otitis externa, mastoiditis, pharyngitis, retropharyngeal abscess or PTA. No abdominal tenderness with normoactive BS which is reassuring and low suspicion for any acute abdomen. CV exam was unremarkable with regular rate and rhythm and no LE edema and low suspicion for any cardiovascular etiology of symptoms. Given patient's symptoms today, suspicion for likely viral URI and encouraged patient to continue with home symptomatic  treatment including antihistamines, nasal decongestants and antitussives. Counseled patient on expectations for symptomatic relief. Encouraged patient to monitor symptoms and if symptoms persist over 10 days, will prescribe Abx for concurrent bacterial infection however, at this time, I do think symptoms are related to a viral infection. Abx risks discussed. Given patient's presentation today without any signs of acute distress and given that patient is well-appearing, no clinical indication for escalation of care. Follow up plans and return/ED precautions were discussed extensively.  Patient/caregiver agrees with plan of  care and verbalized understanding.       This medical document was created using voice recognition software. Although this document has been reviewed, there may be some spelling, grammar, or typographical errors. These errors are due to imperfections in the voice transcription software and do not reflect any compromise in the patient's medical care.    Georges Lynch, PA-C    Supervising physician for this visit is Dr. Mikal Plane.

## 2023-04-12 NOTE — Patient Instructions (Signed)
Thank you for letting us be a part of your care today.   You may take:  OTC Tylenol and Motrin as needed for pain.  OTC Guaifenesin as needed for congestion.  OTC Fluticasone nasal spray as needed for congestion.  OTC Loratadine for congestion.     Please be re-evaluated if you have persistent fever > 100.3F (38C) for more than 5 consecutive days.  Return to clinic if your symptoms worsen or fail to improve 10 days from onset.  Go to ED for chest pain, shortness breath, rapid or difficulty breathing, fainting or near fainting, palpitations, persistent vomiting, severe pain, or any other concerns.

## 2023-04-30 ENCOUNTER — Encounter (INDEPENDENT_AMBULATORY_CARE_PROVIDER_SITE_OTHER): Payer: BLUE CROSS/BLUE SHIELD | Admitting: Student in an Organized Health Care Education/Training Program

## 2023-04-30 ENCOUNTER — Ambulatory Visit
Admission: RE | Admit: 2023-04-30 | Discharge: 2023-04-30 | Disposition: A | Discharge status: Home Health | Payer: BLUE CROSS/BLUE SHIELD | Attending: Student in an Organized Health Care Education/Training Program | Admitting: Student in an Organized Health Care Education/Training Program

## 2023-04-30 DIAGNOSIS — Z1239 Encounter for other screening for malignant neoplasm of breast: Secondary | ICD-10-CM | POA: Insufficient documentation

## 2023-04-30 DIAGNOSIS — R92333 Mammographic heterogeneous density, bilateral breasts: Secondary | ICD-10-CM

## 2023-04-30 DIAGNOSIS — Z1231 Encounter for screening mammogram for malignant neoplasm of breast: Secondary | ICD-10-CM

## 2023-05-01 ENCOUNTER — Ambulatory Visit (INDEPENDENT_AMBULATORY_CARE_PROVIDER_SITE_OTHER): Payer: BLUE CROSS/BLUE SHIELD

## 2023-05-01 ENCOUNTER — Ambulatory Visit (INDEPENDENT_AMBULATORY_CARE_PROVIDER_SITE_OTHER): Payer: BLUE CROSS/BLUE SHIELD | Admitting: Optometrist

## 2023-05-01 DIAGNOSIS — H40003 Preglaucoma, unspecified, bilateral: Secondary | ICD-10-CM

## 2023-05-01 DIAGNOSIS — H2513 Age-related nuclear cataract, bilateral: Secondary | ICD-10-CM

## 2023-05-01 DIAGNOSIS — H43812 Vitreous degeneration, left eye: Secondary | ICD-10-CM

## 2023-05-01 DIAGNOSIS — H35412 Lattice degeneration of retina, left eye: Secondary | ICD-10-CM

## 2023-05-02 ENCOUNTER — Telehealth (HOSPITAL_BASED_OUTPATIENT_CLINIC_OR_DEPARTMENT_OTHER): Payer: Self-pay

## 2023-05-02 ENCOUNTER — Ambulatory Visit (INDEPENDENT_AMBULATORY_CARE_PROVIDER_SITE_OTHER): Payer: BLUE CROSS/BLUE SHIELD | Admitting: Student in an Organized Health Care Education/Training Program

## 2023-05-02 ENCOUNTER — Encounter (INDEPENDENT_AMBULATORY_CARE_PROVIDER_SITE_OTHER): Payer: Self-pay | Admitting: Student in an Organized Health Care Education/Training Program

## 2023-05-02 VITALS — BP 109/72 | HR 80 | Temp 97.7°F | Resp 16 | Ht 69.5 in | Wt 206.0 lb

## 2023-05-02 DIAGNOSIS — E785 Hyperlipidemia, unspecified: Secondary | ICD-10-CM

## 2023-05-02 DIAGNOSIS — Z803 Family history of malignant neoplasm of breast: Secondary | ICD-10-CM

## 2023-05-02 DIAGNOSIS — M8580 Other specified disorders of bone density and structure, unspecified site: Secondary | ICD-10-CM

## 2023-05-02 DIAGNOSIS — E039 Hypothyroidism, unspecified: Secondary | ICD-10-CM

## 2023-05-02 DIAGNOSIS — N951 Menopausal and female climacteric states: Secondary | ICD-10-CM

## 2023-05-02 DIAGNOSIS — R7303 Prediabetes: Secondary | ICD-10-CM

## 2023-05-02 MED ORDER — ESTRADIOL 0.0375 MG/24HR TD PTTW
1.0000 | MEDICATED_PATCH | TRANSDERMAL | 2 refills | Status: DC
Start: 2023-05-05 — End: 2023-05-28

## 2023-05-02 NOTE — Progress Notes (Unsigned)
Chief Complaint   Patient presents with   . Follow Up     Hormonal replacement therapy          SUBJECTIVE::Brittany [name] is 57 year old and here for follow-up.     Menopausal symptoms have improved on estrogen patch.  Sleep: knows that she should do better sleep hygiene.  Fatigue, sneezing. Congestion, no cough.  Allergies have been bad.  Thinks that she gained weight.  Trying to eat better. Cut back on pastries.  Has not been doing much exercise since toe surgery.  Sisters tested for BRCA mutation and     Discussed risks, benefits to hormone replacement including risk of breast cancer, heart disease, stroke, thromboembolic disease. Discussed the goal is to use the lowest dose necessary for the shortest period of time.  She desires to continue at this point. Will reassess need annually.     We discussed the indications for hormone replacement therapy including vasomotor symptoms associated with menopause, genitourinary syndrome of menopause and prevention of osteoporosis.  We discussed my preferred treatment algorithm including systemic estrogen, progesterone when indicated, and testosterone.  As well as local vaginal estrogen and testosterone.  We reviewed the current literature on hormone replacement therapy including the 2002 while Health initiative study.  We discussed the findings and outcomes including the common misconceptions of the study. The findings showed that HRT was associated with decreased risk of colon cancer, endometrial cancer, urinary tract infections, and osteoporosis with an overall significant increase in quality of life.  The findings showed that the greatest benefit was seen in women who were initiated on HRT within 10 years of menopause. We again reviewed the risk of HRT including increased incidence of breast cancer and heart disease highest when initiated in women 10 years post menopause. We discussed the absolute contradictions including history of DVT, known or suspected history of breast  cancer, known insert or suspected estrogen dependent cancers, liver disease, atherosclerotic disease or stroke. These contraindications do not apply to transvaginal based estrogen therapy.    We discussed that oral estrogen has the highest likelihood of adverse events of all the additional estrogen options.  Patient voiced understanding and would like to proceed with the oral estrogen and she has not had symptoms or issues in the past in is the most cost-effective.        ROS  As above      Assessment and Plan:  I personally spent *** total minutes in face-to-face and non-face-to-face activities related to the patient's visit today, excluding interpretation services and any separately reportable services/procedures    There are no diagnoses linked to this encounter.      There are no Patient Instructions on file for this visit.    No follow-ups on file.    OBJECTIVE:: BP 109/72 (BP Location: Left arm, BP Patient Position: Sitting, BP cuff size: Regular)   Pulse 80   Temp 97.7 F (36.5 C) (Temporal)   Resp 16   Ht 5' 9.5" (1.765 m)   Wt 93.4 kg (206 lb)   LMP 08/19/2019   SpO2 98%   BMI 29.98 kg/m   Physical Exam  General Appearance: ***, alert, no distress, pleasant affect, cooperative.  Heart:  normal rate and regular rhythm, no murmurs, clicks, or gallops.  Lungs: clear to auscultation.  Abdomen: BS normal.  Abdomen soft, non-tender.  No masses or hepatosplenomegaly.  Extremities:  no edema.    Labs and chart reviewed.    Lab Results   Component  Value Date    WBC 9.1 10/13/2022    RBC 4.23 10/13/2022    HGB 13.3 10/13/2022    HCT 38.9 10/13/2022    MCV 92.0 10/13/2022    MCHC 34.2 10/13/2022    RDW 12.0 10/13/2022    PLT 185 10/13/2022    MPV 11.1 10/13/2022       Lab Results   Component Value Date    NA 144 10/13/2022    K 3.8 10/13/2022    CL 109 (H) 10/13/2022    BICARB 19 (L) 10/13/2022    BUN 16 10/13/2022    CREAT 0.69 10/13/2022    GLU 112 (H) 10/13/2022    [place]od[place] 8.8 10/13/2022       Lab Results    Component Value Date    AST 19 10/13/2022    ALT 26 10/13/2022    ALK 72 10/13/2022    TP 6.3 10/13/2022    ALB 4.4 10/13/2022    TBILI 1.06 10/13/2022    DBILI 0.3 (H) 08/02/2022       Lab Results   Component Value Date    CHOL 180 08/02/2022    HDL 76 08/02/2022    LDLCALC 79 08/02/2022    TRIG 123 08/02/2022          Problem list was updated and reviewed today as indicated.    Patient Active Problem List    Diagnosis Date Noted   . Gilbert syndrome 02/03/2022   . Inadequate exercise - not at goal 06/29/2021   . Chronic midline low back pain without sciatica 10/17/2015   . Hyperlipidemia, unspecified hyperlipidemia type 09/08/2015   . Impingement syndrome, shoulder, right 05/02/2015   . Radial styloid tenosynovitis 05/02/2015   . Allergic rhinitis due to pollen 04/11/2014   . Gastroesophageal reflux disease without esophagitis 04/11/2014   . Bilateral low back pain without sciatica 09/21/2013   . Tension headache 12/26/2011   . Gluten intolerance 09/17/2011   . Dyspepsia 07/15/2011   . 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 Right knee pain 03/13/2011   . Hearing loss 12/14/2010   . Cerumen impaction 12/14/2010   . Skin lesion 12/05/2010   . Palpitations 10/02/2010   . Fatigue 06/15/2010   . Depression 07/25/2009   . Sebaceous cyst 03/23/2009   . Insomnia 11/16/2008   . Acquired hypothyroidism 09/22/2008   . Right foot pain 08/18/2008   . Grave's disease 01/15/2008   . Vitamin D deficiency 01/06/2008   . Benign neoplasm of skin, site unspecified 08/27/2007       Past medical history, family history, allergies, and medications reviewed and updated as indicated.    Past Medical History:   Diagnosis Date   . Hypothyroidism     h/o hyperthyroidism s/p radioactive iodine   . Infectious mononucleosis 08/19/2006   . Insomnia    . Menorrhagia        Social History:  none     Socioeconomic History   . Marital status: Divorced   Occupational History   . Occupation: Technical brewer   Tobacco Use   . Smoking status: Never      Passive exposure: Yes   . Smokeless tobacco: Never   . Tobacco comments:     2nd hand smoke   Substance and Sexual Activity   . Alcohol use: Yes     Types: 2 Glasses of wine per week     Comment: 1 bottle wine /week   . Drug use: No   .  Sexual activity: Not Currently     Partners: Male     Birth control/protection: Condom   Other Topics Concern   . Military Service No   . Blood Transfusions No   . Caffeine Concern No   . Occupational Exposure No   . Hobby Hazards No   . Sleep Concern Yes   . Stress Concern Yes   . Weight Concern No   . Special Diet Yes     Comment: Gluten sensitivity, mother and sister are Celiac   . Back Care Yes   . Exercises Regularly Yes   . Bike Helmet Use Yes   . Seat Belt Use Yes   . Performs Self-Exams No   Social History Narrative    2019- Divorced and no kids. 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 Yoga and swimming/hiking.        2020: Sister died of endometrial cancer at 92 yo this year. Very precipitous. Mother still alive in [place]-assisted living.        She is youngest-passing has caused much pain        2021: Hikes occasionally. Yoga on her own. Single.      Social Determinants of Health     Food Insecurity: No Food Insecurity (12/10/2022)    Hunger Vital Sign    . Worried About Programme researcher, broadcasting/film/video in the Last Year: Never true    . Ran Out of Food in the Last Year: Never true   Transportation Needs: No Transportation Needs (12/10/2022)    PRAPARE - Transportation    . Lack of Transportation (Medical): No    . Lack of Transportation (Non-Medical): No    Exercise is Medicine (EIM)   Intimate Partner Violence: Low Risk  (10/21/2022)    UC IPV    . Have you ever been emotionally or physically abused by your partner or someone important to you?: No    . Within the last year have you been hit, slapped, kicked or otherwise physically hurt by someone?: No    . Within the last year has anyone forced you to have sexual activities?: No    . Are you afraid  of your spouse or partner you listed above?: No   Housing Stability: Unknown (12/10/2022)    Housing Stability Vital Sign    . Unable to Pay for Housing in the Last Year: Patient declined    . Number of Places Lived in the Last Year: 1    . Unstable Housing in the Last Year: No       Allergies:  Allergies   Allergen Reactions   . Vicodin [Hydrocodone-Acetaminophen] Hallucinations       . acetaminophen Take 2 tablets (650 mg) by mouth every 4 hours as needed for Mild Pain (Pain Score 1-3).     Marland Kitchen albuterol Inhale 2 puffs by mouth every 6 hours as needed for Wheezing. 18 g 0   . azelastine Spray 1 spray into each nostril 2 times daily. Use in each nostril as directed 90 mL 1   . CALCIUM-MAGNESIUM-ZINC PO      . cetirizine Take 1 tablet (10 mg) by mouth daily. 90 tablet 3   . Cholecalciferol (VITAMIN D3 PO) Take 1,000 Int'l Units by mouth daily.     . cyclobenzaprine Take 1 tablet (5 mg) by mouth 3 times daily as needed for Muscle Spasms. 30 tablet 0   . desvenlafaxine Take 1 tablet (50 mg) by mouth daily.  90 tablet 4   . desvenlafaxine Succinate ER Take 1 tablet (25 mg) by mouth daily. 90 tablet 4   . diclofenac Apply 4 g topically 4 times daily as needed (pain). 1 each 3   . estradiol Apply 1 patch topically twice a week. 24 patch 0   . fluticasone propionate Spray 1 spray into each nostril 2 times daily. 1 bottle 5   . levothyroxine Take 1 tablet (100 mcg) by mouth every other day. Take in the morning before breakfast. Alternate with 88 mcg every other day. 45 tablet 3   . levothyroxine Take 1 tablet (88 mcg) by mouth every other day. Take in the morning before breakfast. Alternate with 100 mcg every other day. 45 tablet 3   . metroNIDAZOLE Apply 1 Application topically daily. Use a small amount as directed 1 each 11   . progesterone Take 1 capsule (100 mg) by mouth nightly. 90 capsule 3   . rosuvastatin Take 1 tablet (10 mg) by mouth daily. 90 tablet 2   . tretinoin Apply a thin layer at bedtime as directed 1 Tube 3          Barriers to learning assessed: None.  Patient verbalizes understanding and is agreeable to above plan.

## 2023-05-02 NOTE — Telephone Encounter (Signed)
Received internal referral   Dx: Family history of breast cancer    Indications: Fam hx breast cancer: Mother with breast cancer early 11s and sister with DCIS 56s; Consultation with Chester Holstein     04/30/23 Screening Mammo     Will schedule in high risk clinic with [facility]

## 2023-05-06 ENCOUNTER — Encounter (INDEPENDENT_AMBULATORY_CARE_PROVIDER_SITE_OTHER): Payer: Self-pay | Admitting: Dermatology

## 2023-05-06 DIAGNOSIS — L578 Other skin changes due to chronic exposure to nonionizing radiation: Secondary | ICD-10-CM

## 2023-05-07 ENCOUNTER — Ambulatory Visit (INDEPENDENT_AMBULATORY_CARE_PROVIDER_SITE_OTHER): Payer: BLUE CROSS/BLUE SHIELD | Admitting: Dermatology

## 2023-05-07 DIAGNOSIS — D1801 Hemangioma of skin and subcutaneous tissue: Secondary | ICD-10-CM

## 2023-05-07 DIAGNOSIS — L814 Other melanin hyperpigmentation: Secondary | ICD-10-CM

## 2023-05-07 DIAGNOSIS — L821 Other seborrheic keratosis: Secondary | ICD-10-CM

## 2023-05-07 DIAGNOSIS — L578 Other skin changes due to chronic exposure to nonionizing radiation: Secondary | ICD-10-CM

## 2023-05-07 DIAGNOSIS — D229 Melanocytic nevi, unspecified: Secondary | ICD-10-CM

## 2023-05-07 NOTE — Patient Instructions (Signed)
[facility]    SUN PROTECTION      Quick info about SUNSCREENS:    Sunscreens with zinc oxide or titanium dioxide provide broader spectrum coverage against all wavelengths of UV light.  These mineral blockers are my preference for sunscreen.  I do not recommend spray sunscreens.      For acne prone skin: use a facial moisturizer with an SPF that says NON-COMEDOGENIC and OIL-FREE on it.        Examples:  ELTA MD UV Clear SPF46 facial moisturizer with sunscreen   La Roche-Posay Anthelios Clear Skin Dry Touch Sunscreen SPF 60 or ANTHELIOS MINERAL ZINC OXIDE SUNSCREEN SPF 50  ISDIN Eryfotona Actinica Zinc Oxide and 100% Mineral Sunscreen Broad Spectrum SPF 50  Blue Lizard SPF 50 mineral sunscreen    Sunscreen needs to be reapplied every 1.5 to 2 hours when you are outside.     Be aware of reflective light off water and snow.    Consider wearing sun protective clothing and hats (UPF instead of SPF), this provides more even/uniform coverage than sunscreen.  There are many companies now selling this clothing, I buy my clothing from a company called [employer].     Remember to avoid sun during the peak hours of 10 am - 2 pm.  Wear a wide brimmed hat when outside.  Wear sunglasses that will cover your entire eye and upper cheeks if possible.    Please see below for a list of sunscreen sprays that contain unsafe levels of benzene that we recommend avoiding:      Online information:    https://www.aad.org/public    https://www.aad.org/public/spot-skin-cancer    https://www.melanoma.org

## 2023-05-07 NOTE — Progress Notes (Signed)
[facility]   [address] [facility] [place], [place] [address]  [phone]     Primary MD: Cherlynn Polo Nabil  Consult Requested By: Natasha Bence    HISTORY:  Brittany [name] is a 57 year old female here for TBSE due to sun damaged skin.     Last office visit 03/12/2023 with Omran   - Shave biopsy R radial dorsal hand r/o atypia vs carcinoma vs inflamed benign lesion    Lesion of concern today:   1. Patient brings attention to 2 spots on her back that she gets examined on every visit.    Works in Occupational hygienist for [employer]: 1 major sunburn with peeling as a child   Sun Protection:  intermittent  Melanoma hx: negative   NMSC hx: negative   Family Hx of melanoma: negative   Family hx of skin disease: positive - mother had 3 NMSC (she did not use sunscreen), father with 1 NMSC    Derm Path Review 03/18/2023  [id number]  A: R rad dorsal hand  BENIGN LICHENOID KERATOSIS (L82.1)    Family Hx of melanoma: positive - per patient, father with one melanoma on his back  (reported 03/12/23)  Family Hx of NMSC: positive - mother had 3 NMSC (she did not use sunscreen), father with 1 NMSC    Otherwise denies other new itching, bleeding, painful, growing, ulcerating, or concerning lesions.    PMH: reviewed    Medications: reviewed    Allergies: Vicodin [hydrocodone-acetaminophen]    REVIEW OF SYSTEMS:  DERMATOLOGIC: no other skin complaints.  Constitutional: feels well, no concerns     PHYSICAL EXAM:  General Appearance: within normal limits  Neuro: Alert and oriented x 3  Psych: Mood and affect within normal limits  Eyes: Inspection of lids, sclera, and conjunctiva within normal limits   Areas skin examined included: scalp, forehead, eyebrows, cheeks, nose, lips, chin, ears, sclera, conjunctiva, eyelids, neck, RUE, LUE, hands, chest, abdomen, back, RLE, LLE, feet, buttock, fingernails and toenails     Pertinent findings below:  -scattered even-bordered, even-pigmented macules and papules on the head,  trunk and extremities  -feathery brown macules on the face, forearms and shoulders  -well demarcated, brown, stuck on appearing plaques on head, trunk and extremities  -scattered 2-4 mm cherry red macules  Dyschromia, thinning, loss of elasticity, and wrinkling of the skin in sun exposed areas    ASSESSMENT AND TREATMENT PLAN:    Multiple Benign Nevi (lesions of concern on back)  Lentigines  Seborrheic Keratoses  Cherry angioma  - benign lesions reassured    Sun damaged skin  - educational information about sun protection and skin cancers provided in handout, broad spectrum SPF 30 or higher  - RTC if notices any new or concerning growths    - Diagnoses, natural course, potential treatments and risks were discussed with the patient. Shared decision making. Questions answered.    RTC 12 months for TBSE    Natasha Bence, MD, PhD    ------------------------    Oliva Bustard  The notes I am recording reflect only actions made by and judgments taken by this provider, Dr. Sheral Apley, for whom I am scribing today. I have performed no independent clinical work.    Dominica Severin    ____________________________________________________________________    Chief Operating Officer for Scribed Note    As the attending provider, I agree with the scribed content.  Any changes or edits are noted in the text above.  Jonet Mathies

## 2023-05-12 NOTE — Telephone Encounter (Signed)
 Confirmed new patient consultation with patient  Has the patient been notified of:  Time of appt- yes  Date of appt- yes  Provider- yes  Location- yes  Visitor policy- yes    Understands Gloris Manchester is a female provider     Patient is aware will have exam new patient     Imaging completed at [facility]

## 2023-05-14 ENCOUNTER — Encounter (INDEPENDENT_AMBULATORY_CARE_PROVIDER_SITE_OTHER): Payer: Self-pay | Admitting: Student in an Organized Health Care Education/Training Program

## 2023-05-28 ENCOUNTER — Other Ambulatory Visit (INDEPENDENT_AMBULATORY_CARE_PROVIDER_SITE_OTHER): Payer: Self-pay | Admitting: Student in an Organized Health Care Education/Training Program

## 2023-05-28 DIAGNOSIS — N951 Menopausal and female climacteric states: Secondary | ICD-10-CM

## 2023-06-02 NOTE — Telephone Encounter (Signed)
 Pt requesting  90 ds      Dose increased per last refill      Levon Rhymes, CPhT  (Rx Refill [facility])      Hormone Refill Protocol    Last visit in enc specialty: 05/02/2023     Recent Visits in This Encounter Department       Date Provider Department Visit Type Primary Dx    05/02/2023 Gala Eva Olszewski, MD [facility] Office Visit Vasomotor symptoms due to menopause    12/11/2022 Hakim, Eva Olszewski, MD [facility] Internal Medicine Office Visit Family history of osteoporosis    10/21/2022 Hakim, Eva Olszewski, MD [facility] Internal Medicine Office Visit History of epigastric pain    08/02/2022 Hakim, Eva Olszewski, MD [facility] Internal Medicine Office Visit Encounter for routine adult medical examination    06/29/2021 Hakim, Eva Olszewski, MD [facility] Internal Medicine Office Visit Allergic rhinitis, unspecified seasonality, unspecified trigger           Population Health Visits  Recent [facility] Visits    None       Next f/u appt due:   Return in about 3 months (around 07/31/2023) for Annual physical with labs prior.  Next appt in enc specialty: 08/01/2023      Future Appointments 06/02/2023 - 05/31/2028        Date Visit Type Department Provider     07/01/2023 10:30 AM RETURN WELL WOMAN EXAM [place] [facility] Deak, Sharlet Daub, MD    Appointment Notes:     [facility], PT aware of visitor policy             07/31/2023  9:00 AM NEW BREAST SURG [place] [facility] Jerrell Ned, NP    Appointment Notes:     NP Consult Family history of breast cancer             08/01/2023  8:15 AM RETURN PRIMARY CARE PATIENT [facility] Internal Medicine Hakim, Eva Olszewski, MD    Appointment Notes:     62mo f/up             10/30/2023  8:00 AM HUMPHREY VISUAL FIELD [place] [facility], [place]    Appointment Notes:     Return in about 6 months (around  10/30/2023) for HVF 24-2, OCT, IOP Check             10/30/2023  8:30 AM RETURN OPHTHALMOLOGY [place] [facility] Babara Niels [place], [place]    Appointment Notes:     Return in about 6 months (around 10/30/2023) for HVF 24-2, OCT, IOP Check             05/07/2024  9:00 AM RETURN GEN DERM [facility] Vergie Glatter, MD    Appointment Notes:     [facility]                      Per OV  05/02/23  HPI-Menopausal symptoms have improved since starting on estrogen patch.  A/P-Vasomotor symptoms due to menopause  -     estradiol  (VIVELLE -DOT) 0.0375 MG/24HR patch; Apply 1 patch topically twice a week.  Doing well on estradiol  patch and she would like to increase the dose for added control of vasomotor symptoms. Will increase  from 0.025 to 0.0375 mg/24 hr.      LABS required:  (none- lipid panel removed 09/08/18)      Monitoring required:     (Q yr Age:  (Systemic) FYI to PCP at 58yo, hard stop w FYI to PCP at 58yo)  (Q yr BP)  (Q 2 year mammogram - Topical tx excluded)   (Pap smear per HM or guidelines, see below)    General pap smear guidelines (Note: Use HM tab, unless not available, then follow GLs below):  For patients who have not had a total abdominal hysterectomy (TAH):  Age <21 -- (not required)  Age   38-30 -- q 3 yrs   Age   21-65 -- q 3 yrs unless HPV co-testing (-) then q 5 yrs   Age >73 --  (not required)      Pt age:  58 year old    (BP range: Systolic=90-150  Ipjdunopr=49-09)  Blood Pressure   05/02/23 109/72   04/12/23 115/62   12/11/22 102/68         Formulation:   Patch (systemic):  MMG required    Last mammogram: 04/30/2023   Result:  There is no mammographic evidence of breast cancer.   Next due per HM:  04/29/2025       Last pap smear: 02/01/2020    Result:  No Atypical or Malignant Cells   Next due per HM:  01/31/2025         For pharmacist: Is pt on appropriate progesterone  tx, if required?   ([facility] requires PG, eString  does NOT)

## 2023-06-03 MED ORDER — ESTRADIOL 0.0375 MG/24HR TD PTTW
1.0000 | MEDICATED_PATCH | TRANSDERMAL | 0 refills | Status: DC
Start: 2023-06-05 — End: 2023-08-22

## 2023-07-01 ENCOUNTER — Encounter (INDEPENDENT_AMBULATORY_CARE_PROVIDER_SITE_OTHER): Payer: Self-pay | Admitting: Obstetrics & Gynecology

## 2023-07-01 ENCOUNTER — Ambulatory Visit (INDEPENDENT_AMBULATORY_CARE_PROVIDER_SITE_OTHER): Payer: BLUE CROSS/BLUE SHIELD | Admitting: Obstetrics & Gynecology

## 2023-07-01 VITALS — BP 129/83 | HR 103 | Temp 97.5°F | Ht 69.5 in | Wt 208.0 lb

## 2023-07-01 DIAGNOSIS — Z01419 Encounter for gynecological examination (general) (routine) without abnormal findings: Secondary | ICD-10-CM

## 2023-07-01 NOTE — Progress Notes (Signed)
 Interval History  Brittany [name] is a 58 year old female who is here for   Chief Complaint   Patient presents with    Gyn Exam   .    VITALS: BP 129/83 (BP Location: Left arm, BP Patient Position: Sitting, BP cuff size: Regular)   Pulse 103   Temp 97.5 F (36.4 C) (Temporal)   Ht 5' 9.5" (1.765 m)   Wt 94.3 kg (208 lb)   LMP 08/19/2019   SpO2 95%   BMI 30.28 kg/m     Allergies   Allergen Reactions    Vicodin [Hydrocodone-Acetaminophen] Hallucinations       1.  OB/GYN HISTORY:  OB History   Gravida Para Term Preterm AB Living   0 0 0 0 0 0   SAB IAB Ectopic Multiple Live Births   0 0 0 0 0     Patient's last menstrual period was 08/19/2019.     Pt denies any bothersome gyn symptoms except had developed bothersome menopausal symtpoms  of hot flashes and difficulty.  Started on systemic HRT by her primary MD.  Bluford Main is it helping with hot flashes and sleep.  .  Denies any vaginal bleeding or abnormal discharge.      Pap/hpv normal 01/2020  Mmg 04/2023 normal     Hrt dosing  Vivelle 0.375 patch  Prometrium 100mg  nightly       Past Medical History:   Diagnosis Date    Hypothyroidism     h/o hyperthyroidism s/p radioactive iodine    Infectious mononucleosis 08/19/2006    Insomnia     Menorrhagia      Past Surgical History:   Procedure Laterality Date    left wrist tendon repair  05/20/1996    CHOLECYSTECTOMY, LAP  05/20/1993    PB REMOVE TONSILS/ADENOIDS,<12 Y/O  05/20/1973    CHOLECYSTECTOMY  1997    PB MYOMECTOMY 1-4 MYOMAS 250 GM/< VAGINAL APPR      wisdom teeth       Current Outpatient Medications   Medication Instructions    acetaminophen (TYLENOL) 650 mg, Oral, EVERY 4 HOURS PRN    albuterol (PROVENTIL HFA) 108 (90 Base) MCG/ACT inhaler 2 puffs, Inhalation, EVERY 6 HOURS PRN    azelastine (ASTELIN) 0.1 % nasal spray 1 spray, Each Naris, 2 TIMES DAILY, Use in each nostril as directed    CALCIUM-MAGNESIUM-ZINC PO No dose, route, or frequency recorded.    cetirizine (ZYRTEC) 10 mg, Oral, DAILY     Cholecalciferol (VITAMIN D3 PO) 1,000 Int'l Units, Oral, DAILY    cyclobenzaprine (FLEXERIL) 5 mg, Oral, 3 TIMES DAILY PRN    desvenlafaxine (PRISTIQ) 50 mg, Oral, DAILY    desvenlafaxine Succinate ER (PRISTIQ) 25 mg, Oral, DAILY    diclofenac (VOLTAREN) 4 g, Topical, 4 TIMES DAILY PRN    estradiol (VIVELLE-DOT) 0.0375 MG/24HR patch 1 patch, Transdermal, TWICE WEEKLY    fluticasone propionate (FLONASE) 50 MCG/ACT nasal spray 1 spray, Each Naris, 2 TIMES DAILY    levothyroxine (SYNTHROID) 100 mcg, Oral, EVERY OTHER DAY, Take in the morning before breakfast. Alternate with 88 mcg every other day.    levothyroxine (SYNTHROID) 88 mcg, Oral, EVERY OTHER DAY, Take in the morning before breakfast. Alternate with 100 mcg every other day.    metroNIDAZOLE (METROGEL) 1 % gel 1 Application., Topical, DAILY, Use a small amount as directed    progesterone (PROMETRIUM) 100 mg, Oral, NIGHTLY    rosuvastatin (CRESTOR) 10 mg, Oral, DAILY    tretinoin (RETIN-A)  0.025 % cream Apply a thin layer at bedtime as directed         ROS: Review of Systems  Constitutional: Negative  Eyes: Glasses/Contacts  ENT: Ringing in ears  Cardiac: Negative  Pulmonary: Negative  Gastrointestional: Negative  Musculoskeletal: Negative  Skin: Negative  Neurologic: Negative  Psychiatric: Negative  Endocrine: Hot flashes;Dry skin  Blood Disease: Negative  Allergy: Sinus problems  OB/Gyn: Negative       I did review available medical, surgical, obstetrical and social history.    PHYSICAL EXAM:  Head: negative  Neck:  thyroid normal, no adenopathy  Breasts: no lymphadenopathy, no skin changes, no masses or discharge  Abdomen: abdomen soft, non-tender, BS normal, no masses or HSM  Pelvic: normal external female genitalia  Vulva/Vagina: normal, no abnormal discharge  Cervix: normal in appearance, no lesions or masses  Uterus: normal sized, mobile, and non-tender  Adnexa: no adnexal masses or tenderness  Rectal Exam: not performed  Abnormal Findings:  none    IMPRESSION/ PLAN: Normal gyn exam  Doing well on HRT    HCM: pap smear and mammogram up to date         Patient instructed on: diet, exercise, mammography, vulvar health, menopause/hrt    Patient barriers to learnng: none    Pt/Family understanding: verbalizes    Follow-Up: Return in 1 year for annual exam or  sooner if problems should occur.    Reuel Derby, MD

## 2023-07-02 ENCOUNTER — Encounter (INDEPENDENT_AMBULATORY_CARE_PROVIDER_SITE_OTHER): Payer: Self-pay | Admitting: Student in an Organized Health Care Education/Training Program

## 2023-07-03 ENCOUNTER — Encounter (INDEPENDENT_AMBULATORY_CARE_PROVIDER_SITE_OTHER): Payer: Self-pay | Admitting: Student in an Organized Health Care Education/Training Program

## 2023-07-03 NOTE — Telephone Encounter (Addendum)
 Noted     Closing encounter

## 2023-07-03 NOTE — Telephone Encounter (Signed)
 Routing to Dr. Corrinne Eagle for review and advise.

## 2023-07-23 ENCOUNTER — Other Ambulatory Visit: Payer: BLUE CROSS/BLUE SHIELD | Attending: Student in an Organized Health Care Education/Training Program

## 2023-07-23 DIAGNOSIS — E039 Hypothyroidism, unspecified: Secondary | ICD-10-CM | POA: Insufficient documentation

## 2023-07-23 DIAGNOSIS — E785 Hyperlipidemia, unspecified: Secondary | ICD-10-CM | POA: Insufficient documentation

## 2023-07-23 DIAGNOSIS — M8580 Other specified disorders of bone density and structure, unspecified site: Secondary | ICD-10-CM | POA: Insufficient documentation

## 2023-07-23 DIAGNOSIS — R17 Unspecified jaundice: Secondary | ICD-10-CM | POA: Insufficient documentation

## 2023-07-23 DIAGNOSIS — R7303 Prediabetes: Secondary | ICD-10-CM | POA: Insufficient documentation

## 2023-07-23 LAB — LIPID(CHOL FRACT) PANEL, BLOOD
Cholesterol: 171 mg/dL (ref <200)
HDL-Cholesterol: 63 mg/dL
LDL-Chol (Calc): 90 mg/dL (ref <160)
Non-HDL Cholesterol: 108 mg/dL
Triglycerides: 97 mg/dL (ref 10–170)

## 2023-07-23 LAB — COMPREHENSIVE METABOLIC PANEL, BLOOD
ALT (SGPT): 16 U/L (ref 0–33)
AST (SGOT): 15 U/L (ref 0–32)
Albumin: 4.6 g/dL (ref 3.5–5.2)
Alkaline Phos: 76 U/L (ref 40–130)
Anion Gap: 10 mmol/L (ref 7–15)
BUN: 15 mg/dL (ref 6–20)
Bicarbonate: 26 mmol/L (ref 22–29)
Bilirubin, Tot: 1.49 mg/dL — ABNORMAL HIGH (ref <1.2)
Calcium: 9.2 mg/dL (ref 8.5–10.6)
Chloride: 106 mmol/L (ref 98–107)
Creatinine: 0.86 mg/dL (ref 0.51–0.95)
Glucose: 111 mg/dL — ABNORMAL HIGH (ref 70–99)
Potassium: 3.9 mmol/L (ref 3.5–5.1)
Sodium: 142 mmol/L (ref 136–145)
Total Protein: 6.8 g/dL (ref 6.0–8.0)
eGFR Based on CKD-EPI 2021 Equation: >60 mL/min/{1.73_m2}

## 2023-07-23 LAB — GLYCOSYLATED HGB(A1C), BLOOD: Glyco Hgb (A1C): 5.3 % (ref 4.8–5.8)

## 2023-07-23 LAB — TSH, BLOOD: TSH: 4.65 u[IU]/mL — ABNORMAL HIGH (ref 0.27–4.20)

## 2023-07-23 LAB — VITAMIN D, 25-OH TOTAL: Vitamin D, 25-Hydroxy: 35 ng/mL (ref 30–80)

## 2023-07-23 NOTE — Interdisciplinary (Signed)
 Blood drawn from right arm with 21 gauge needle. 3 tubes taken.   Patient identity authenticated by Fran Lowes Rasch.

## 2023-07-24 ENCOUNTER — Encounter (INDEPENDENT_AMBULATORY_CARE_PROVIDER_SITE_OTHER): Payer: Self-pay | Admitting: Student in an Organized Health Care Education/Training Program

## 2023-07-24 DIAGNOSIS — R17 Unspecified jaundice: Secondary | ICD-10-CM

## 2023-07-24 LAB — RETICULOCYTES AUTOMATED, BLOOD
Retic %, Auto: 1.8 % (ref 0.5–1.8)
Retic Count, Absolute: 80.4 10*3/uL — ABNORMAL HIGH (ref 16.0–78.0)

## 2023-07-24 LAB — HAPTOGLOBIN, BLOOD: Haptoglobin: 118 mg/dL (ref 30–200)

## 2023-07-24 LAB — LDH, BLOOD: LDH: 186 U/L — ABNORMAL HIGH (ref 25–175)

## 2023-07-28 NOTE — Patient Instructions (Signed)
 [facility] PHONE LIST FOR PATIENTS  Hours of operation: Monday-Friday 8:00 - 5:00pm, Closed Holidays and Weekends      AFTER HOURS EMERGENCY NUMBER: [phone] Ask for On-Call Surgeon for Surgical Symptoms. As for [facility] Oncologist for Medical Symptoms     Admin Assistant: Hassell Halim for Dr. Orion Modest:   [facility]: [phone] FAX: [phone]    Admin Assistant: Zella Richer for Nurse Practitioner Mariam Dollar: [phone]    Nurse Case Managers for Dr. Orion Modest and Nurse Practitioner Mariam Dollar:   Marius Ditch., RN @ [phone]  Annia Friendly., RN @ [phone]  Gardiner Rhyme., RN @ [phone]    Social Worker Phone: [phone]     Tell us How We Did During Your Consultation/Follow Up Visit...  Your feedback goes a long way and makes a difference. If you would like to provide [place] feedback on how we did via telephone or  Email  E-mail: [email or web address] Phone: [phone]      Thank you for the opportunity to care for you during this time.

## 2023-07-28 NOTE — Interdisciplinary (Deleted)
 New Patient: For evaluation            Fall Risk Interventions:   {Fall Risk Interventions:38420}     Education provided to patient/caregiver on fall risk factors and precautions.      Nutrition        Nutrition Interventions:  {Nutrition Interventions:38421}     Education provided on importance of early nutrition intervention for improved symptom management and outcomes, with in-person or telehealth visit and family involvement.     Wellbeing Screening    Screening  [facility] V2       Question 07/24/2023  9:03 AM PDT - Fredricka by Patient    Would you like to complete the Wellbeing Screening at this time? / Desea completar la evaluacin de bienestar en este momento? Yes    Who is filling out this form? / Quin esta llenando este formulario? I am completing the form for myself. / Estoy completando el formulario por mi cuenta.    1. In general, would you say your health is: Good    2. In general, would you say your quality of life is: Very Good    3. In general, how would you rate your physical health? Good    4. In general, how would you rate your mental health, including your mood and your ability to think? Good    5. In general, how would you rate your satisfaction with your social activities and relationships? Good    6. To what extent are you able to carry out your everyday physical activities such as walking, climbing stairs, carrying groceries, or moving a chair? Completely    7. In general, please rate how well you carry out your usual social activities and roles. 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 How would you rate your pain on average? 5    9. How would you rate your fatigue on average? Moderate    10. How often have you been bothered by emotional problems such as feeling anxious, depressed or irritable? Sometimes    11. Do you have concerns about your fertility?  No    12. Do you have concerns in any of the following areas: Financial    13. Would you like to speak with anyone about your spiritual needs? No    PROMIS Adult Short Form-Global Health Score (Physical) (range: 16 - 68) 44.9    PROMIS Adult Short Form-Global Health Score (Mental) (range: 21 - 68) 45.8           {Wellbeing  Interventions (Optional):38422}    {Oral Cancer-Directed Therapy (Optional):38423}    Pain        Pain interventions:    {Pain Interventions:38424}    Language: English          Learning Needs Assessment:      Learner: {OZJMWZM:58359}   Challenges to learning: {BARRIERS TO OZJMWPWH:58360}   Readiness to learn: {READINESS TO OZJMW:58358}   Method: {LEARNING FZUYNI:58357}    Patient Education:   New cancer-directed therapy teaching:{YES/NO:40792}    Education Materials:  {Education Materials Provided:38426}    Educated on plan of care, office contact, and AVS using teach-back method. Questions answered to patient/caregiver satisfaction.        {Teaching Outcomes:38425}      Plan of care: ***

## 2023-07-31 ENCOUNTER — Encounter (HOSPITAL_BASED_OUTPATIENT_CLINIC_OR_DEPARTMENT_OTHER): Payer: Self-pay | Admitting: Nurse Practitioner

## 2023-07-31 ENCOUNTER — Ambulatory Visit: Payer: BLUE CROSS/BLUE SHIELD | Attending: Nurse Practitioner | Admitting: Nurse Practitioner

## 2023-07-31 VITALS — BP 123/80 | HR 102 | Temp 97.8°F | Resp 18 | Ht 69.5 in | Wt 209.2 lb

## 2023-07-31 DIAGNOSIS — R9230 Dense breasts, unspecified: Secondary | ICD-10-CM | POA: Insufficient documentation

## 2023-07-31 DIAGNOSIS — Z803 Family history of malignant neoplasm of breast: Secondary | ICD-10-CM | POA: Insufficient documentation

## 2023-07-31 NOTE — Interdisciplinary (Signed)
 New Patient: For evaluation of high risk screening    CC: Family history of breast CA    04/30/23 SCREENING MAMMOGRAM WITH DIGITAL BREAST TOMOSYNTHESIS FINDINGS:  The breasts are heterogeneously dense, which may obscure small masses.     There are no masses, asymmetries, or suspicious calcifications.     IMPRESSION / RECOMMENDATION:  There is no mammographic evidence of breast cancer.     Normal interval follow-up mammogram in 1 year is recommended.     ASSESSMENT:  BI-RADS Category 1:  Negative    Symptom-Focused Nursing Assessment:   Presents to clinic unaccompanied to discuss her risk for breast cancer. Had a baseline MRI in 2018 which was normal.   Since HRT has noticed breasts feel larger. Reports she has always had dense breasts.     History:  Menarche: 13  LMP:  52  Pregnancies: 0  HX Birth Control: yes  HX HRT: vivelle  patch and progesterone  to help with hot flashes and insomnia, started 5 months ago   Breast Implants/Type: denies  Hx of Breast Biopsy/FNA:  0  Smoking: denies  Alcohol: social  Currently working?: yes  Ashkenazi Ancestry? denies  Genetic Testing? Denies, since mother was negative no one else in the family had testing    Med/ Sx Hx: hypothyroid, pre-diabetes, chole, myomectomy, left wrist tendon repair     Family History of Breast Cancer:  Mother - 42s recurred in 66s negative for BRCA 9 years ago  Sister - 74s DCIS   Paternal Aunt    Family History of Cancer:  Sister- endometrial cancer - 76      Fall Risk  1 or more of the following as reported by patient/caregiver = patient risk for falls: No fall risk identified     Interventions implemented during clinic visit: Oriented to room    Fall Risk Interventions:   No fall risk identified; no interventions at this time     Education provided to patient/caregiver on fall risk factors and precautions.      Nutrition  Malnutrition Screening Tool (MST): 0     Nutrition Interventions:  MST score < or = 1, referral not indicated, no additional  questions or concerns     Education provided on importance of early nutrition intervention for improved symptom management and outcomes, with in-person or telehealth visit and family involvement.     Wellbeing Screening    Screening  [facility] V2       Question 07/24/2023  9:03 AM PDT - Brittany [name] by Patient    Would you like to complete the Wellbeing Screening at this time? / Desea completar la evaluacin de bienestar en este momento? Yes    Who is filling out this form? / Quin esta llenando este formulario? I am completing the form for myself. / Estoy completando el formulario por mi cuenta.    1. In general, would you say your health is: Good    2. In general, would you say your quality of life is: Very Good    3. In general, how would you rate your physical health? Good    4. In general, how would you rate your mental health, including your mood and your ability to think? Good    5. In general, how would you rate your satisfaction with your social activities and relationships? Good    6. To what extent are you able to carry out your everyday physical activities such as walking, climbing stairs, carrying groceries, or moving a chair?  Completely    7. In general, please rate how well you carry out your usual social activities and roles. 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 How would you rate your pain on average? 5    9. How would you rate your fatigue on average? Moderate    10. How often have you been bothered by emotional problems such as feeling anxious, depressed or irritable? Sometimes    11. Do you have concerns about your fertility? No    12. Do you have concerns in any of the following areas: Financial    13. Would you like to speak with anyone about your spiritual needs? No    PROMIS Adult Short Form-Global Health Score (Physical) (range: 16 - 68) 44.9    PROMIS  Adult Short Form-Global Health Score (Mental) (range: 21 - 68) 45.8           Wellbeing  Interventions : No issues identified; no interventions at this time    Patient not on active oral cancer-directed therapy at this time    Pain   Pain Score  Pain Score: 0    Pain interventions:    No intervention needed at this time    Language: English          Learning Needs Assessment:      Learner: Patient   Challenges to learning: None   Readiness to learn: Acceptance   Method: Explanation    Patient Education:   New cancer-directed therapy teaching:No    Education Materials:  No written or electronic educational materials provided for this visit     Educated on plan of care, office contact, and AVS using teach-back method. Questions answered to patient/caregiver satisfaction.        Patient/caregiver verbalized understanding      Plan of care:   -Breast MRI in June  -Mammogram and same day visit with Fabiene in December  -reading material for raloxifene provided

## 2023-07-31 NOTE — Goals of Care (Signed)
 Advance Care Planning      What gives the patient's life meaning?       Patient would be willing to endure aggressive medical therapies as long as they could still:       Who would make medical decisions for the patient if they are unable to make decisions for themselves?   Sister- Almarie    Based on above information I recommended the following:  Reviewing [email or web address]    Total time spent face-to-face with patient and/or surrogate decision maker providing counseling related to advance care planning:   1 minutes  Advance Care Planning

## 2023-07-31 NOTE — Progress Notes (Signed)
 Medical Record #: [id number]   DOB: [birth year]    Reason for Visit  Chief Complaint   Patient presents with    Family History Of Cancer        History of Present Illness:     Brittany [name] is a 58 year old female who is here for Family History Of Cancer    Pt presents to clinic for consult to discuss her family hx of breast cancer and recommendations for screening and prevention. She notes her mother and sister had a hx of breast cancer in their 47s. She notes her mom when she had recurrence in her 25s completed BRCA testing only but not panel testing. She notes her sister never had testing. She denies any symptoms of the breast including pain, erythema, nipple d/c, axillary fullness. She does note she started HRT about 5 months ago d/t hot flashes and insomnia and while that only had marginally helped with those symptoms she notes her breast seems to be becoming larger.     Her medical hx and risk factors as follows:  Menarche: 13  LMP:  52  Pregnancies: 0  HX Birth Control: yes  HX HRT: vivelle  patch and progesterone  to help with hot flashes and insomnia, started 5 months ago   Breast Implants/Type: denies  Hx of Breast Biopsy/FNA:  0  Smoking: denies  Alcohol: social  Currently working?: yes  Ashkenazi Ancestry? denies  Genetic Testing? Denies, since mother was negative no one else in the family had testing     Med/ Sx Hx: hypothyroid, pre-diabetes, chole, myomectomy, left wrist tendon repair      Family History of Breast Cancer:  Mother - 80s recurred in 32s negative for BRCA 9 years ago  Sister - 87s DCIS   Paternal Aunt     Family History of Cancer:  Sister- endometrial cancer - 10      Past Medical History:   Diagnosis Date    Hypothyroidism     h/o hyperthyroidism s/p radioactive iodine    Infectious mononucleosis 08/19/2006    Insomnia     Menorrhagia          Past Surgical History:   Procedure Laterality Date    left wrist tendon repair  05/20/1996    CHOLECYSTECTOMY, LAP  05/20/1993    PB REMOVE  TONSILS/ADENOIDS,<12 Y/O  05/20/1973    CHOLECYSTECTOMY  1997    PB MYOMECTOMY 1-4 MYOMAS 250 GM/< VAGINAL APPR      wisdom teeth           Family History   Problem Relation Name Age of Onset    Breast Cancer Mother Eloyce Bultman - deceased 101    Cancer Mother Myrna Vonseggern - deceased         Stage 4 breast cancer    Hypertension Father Mr. ZAYLEE CORNIA - deceased     Other Sister Saroya Riccobono - deceased         Uterine fibroids/hysterectomy    Crohn's Disease Sister Gracemarie Skeet - deceased     Cancer Sister Farhiya Rosten - deceased         Endometrial cancer    Breast Cancer Sister Elveria Fridge 40        DCIS    Hypertension Sister Elveria Fridge         Hypertension / obese    Endometrial Cancer Sister Topaz Raglin     Hypertension Sister      Cholesterol/Lipid Disorder Sister  Heart Disease Sister Eran Windish         Stent    Diabetes Sister Lakyn Alsteen         Adult onset    Hypertension Sister Marylouise Mallet     Thyroid  Sister Destane Speas     Breast Cancer P Aunt      Ovarian Cancer Neg Hx          but sister w/ovarian cysts age 72       Allergies  Allergies   Allergen Reactions    Vicodin [Hydrocodone-Acetaminophen ] Hallucinations          acetaminophen  Take 2 tablets (650 mg) by mouth every 4 hours as needed for Mild Pain (Pain Score 1-3).      albuterol  Inhale 2 puffs by mouth every 6 hours as needed for Wheezing. 18 g 0    azelastine  Spray 1 spray into each nostril 2 times daily. Use in each nostril as directed 90 mL 1    CALCIUM -MAGNESIUM -ZINC  PO       cetirizine  Take 1 tablet (10 mg) by mouth daily. 90 tablet 3    Cholecalciferol (VITAMIN D3 PO) Take 1,000 Int'l Units by mouth daily.      cyclobenzaprine  Take 1 tablet (5 mg) by mouth 3 times daily as needed for Muscle Spasms. 30 tablet 0    desvenlafaxine  Take 1 tablet (50 mg) by mouth daily. 90 tablet 4    desvenlafaxine  Succinate ER Take 1 tablet (25 mg) by mouth daily. 90 tablet 4    diclofenac  Apply 4 g topically 4 times daily as needed (pain). 1 each 3     estradiol  Apply 1 patch topically twice a week. 24 patch 0    fluticasone  propionate Spray 1 spray into each nostril 2 times daily. 1 bottle 5    levothyroxine  Take 1 tablet (100 mcg) by mouth every other day. Take in the morning before breakfast. Alternate with 88 mcg every other day. 45 tablet 3    levothyroxine  Take 1 tablet (88 mcg) by mouth every other day. Take in the morning before breakfast. Alternate with 100 mcg every other day. 45 tablet 3    metroNIDAZOLE  Apply 1 Application topically daily. Use a small amount as directed 1 each 11    progesterone  Take 1 capsule (100 mg) by mouth nightly. 90 capsule 3    rosuvastatin  Take 1 tablet (10 mg) by mouth daily. 90 tablet 2    tretinoin  Apply a thin layer at bedtime as directed 1 Tube 3         Socioeconomic History    Marital status: Divorced   Occupational History    Occupation: technical brewer   Tobacco Use    Smoking status: Former     Current packs/day: 0.00     Types: Cigarettes     Quit date: 10/19/1998     Years since quitting: 24.7     Passive exposure: Past    Smokeless tobacco: Never    Tobacco comments:     2nd hand smoke   Substance and Sexual Activity    Alcohol use: Yes     Alcohol/week: 2.0 standard drinks of alcohol     Types: 2 Glasses of wine per week     Comment: 1 bottle wine /week    Drug use: No    Sexual activity: Not Currently     Partners: Male     Birth control/protection: Condom   Social Activities of Daily Living Present  Military Service No    Blood Transfusions No    Caffeine Concern No    Occupational Exposure No    Hobby Hazards No    Sleep Concern Yes    Stress Concern Yes    Weight Concern No    Special Diet Yes     Comment: Gluten sensitivity, mother and sister are Celiac    Back Care Yes    Exercises Regularly Yes    Bike Helmet Use Yes    Seat Belt Use Yes    Performs Self-Exams No   Social History Narrative    Divorced and no kids. 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     2020:  Sister died of endometrial cancer at 72 yo.      Social History     Tobacco Use   Smoking Status Former    Current packs/day: 0.00    Types: Cigarettes    Quit date: 10/19/1998    Years since quitting: 24.7    Passive exposure: Past   Smokeless Tobacco Never   Tobacco Comments    2nd hand smoke     Social History     Substance and Sexual Activity   Alcohol Use Yes    Alcohol/week: 2.0 standard drinks of alcohol    Types: 2 Glasses of wine per week    Comment: 1 bottle wine /week     Social History     Substance and Sexual Activity   Drug Use No       Problem List  Patient Active Problem List   Diagnosis    Benign neoplasm of skin, site unspecified    Vitamin D  deficiency    Grave's disease    Right foot pain    Acquired hypothyroidism    Insomnia    Sebaceous cyst    Depression    Fatigue    Palpitations    Skin lesion    Hearing loss    Cerumen impaction    Right knee pain    Dyspepsia    Intestinal gas excretion    Gluten intolerance    Tension headache    Bilateral low back pain without sciatica    Allergic rhinitis due to pollen    Gastroesophageal reflux disease without esophagitis    Impingement syndrome, shoulder, right    Radial styloid tenosynovitis    Hyperlipidemia, unspecified hyperlipidemia type    Chronic midline low back pain without sciatica    Inadequate exercise - not at goal    [place] syndrome       Review of Systems    Pertinent items are noted in HPI.    Physical Exam  Patient is a 58 year old female who appeared alert, cooperative, no distress  BP 123/80 (BP Location: Right arm, BP Patient Position: Sitting, BP cuff size: Regular)   Pulse 102   Temp 97.8 F (36.6 C) (Temporal)   Resp 18   Ht 5' 9.5 (1.765 m)   Wt 94.9 kg (209 lb 3.5 oz)   LMP 08/19/2019   SpO2 99%   BMI 30.45 kg/m   Body mass index is 30.45 kg/m.  General Appearance: healthy, alert, no distress, pleasant affect, cooperative.  Neck:  Neck supple. No adenopathy, thyroid  symmetric, normal size.  Breast:  normal in size  and symmetry, normal contour with no evidence of flattening or dimpling, skin normal, nipples everted without rashes or discharge, palpation negative for masses or nodules with diffuse fibrocystic  tissue throughout. No noted lymphadenopathy.  Abdomen: Abdomen soft, non-tender. No masses or organomegaly. Bowel sounds normal.  Skin:  negative.    Medical Decision Making  Test Results:  Results for orders placed or performed in visit on 07/23/23   Vitamin D , 25-OH Total Yellow serum separator tube   Result Value Ref Range    Vitamin D , 25-Hydroxy 35 30 - 80 ng/mL   Comprehensive Metabolic Panel (CMP)   Result Value Ref Range    Glucose 111 (H) 70 - 99 mg/dL    BUN 15 6 - 20 mg/dL    Creatinine 9.13 9.48 - 0.95 mg/dL    eGFR Based on CKD-EPI 2021 Equation >60 mL/min/1.73 m2    Sodium 142 136 - 145 mmol/L    Potassium 3.9 3.5 - 5.1 mmol/L    Chloride 106 98 - 107 mmol/L    Bicarbonate 26 22 - 29 mmol/L    Anion Gap 10 7 - 15 mmol/L    Calcium  9.2 8.5 - 10.6 mg/dL    Total Protein 6.8 6.0 - 8.0 g/dL    Albumin 4.6 3.5 - 5.2 g/dL    Bilirubin, Tot 8.50 (H) <1.2 mg/dL    AST (SGOT) 15 0 - 32 U/L    ALT (SGPT) 16 0 - 33 U/L    Alkaline Phos 76 40 - 130 U/L   Glycosylated Hgb(A1C), Blood   Result Value Ref Range    Glyco Hgb (A1C) 5.3 4.8 - 5.8 %   TSH, Blood - See Instructions   Result Value Ref Range    TSH 4.65 (H) 0.27 - 4.20 uIU/mL   Lipid Panel Green Plasma Separator Tube   Result Value Ref Range    Cholesterol 171 <200 mg/dL    HDL-Cholesterol 63 mg/dL    LDL-Chol (Calc) 90 <839 mg/dL    Non-HDL Cholesterol 108 mg/dL    Triglycerides 97 10 - 170 mg/dL   Reticulocyte Automated   Result Value Ref Range    Retic %, Auto 1.8 0.5 - 1.8 %    Retic Count, Absolute 80.4 (H) 16.0 - 78.0 1000/mm3   Haptoglobin, Blood   Result Value Ref Range    Haptoglobin 118 30 - 200 mg/dL   LDH, Blood   Result Value Ref Range    LDH 186 (H) 25 - 175 U/L     04/30/23 SCREENING MAMMOGRAM WITH DIGITAL BREAST TOMOSYNTHESIS FINDINGS:  The breasts  are heterogeneously dense, which may obscure small masses.     There are no masses, asymmetries, or suspicious calcifications.     IMPRESSION / RECOMMENDATION:  There is no mammographic evidence of breast cancer.     Normal interval follow-up mammogram in 1 year is recommended.     ASSESSMENT:  BI-RADS Category 1:  Negative    Impression:  Chanda has a family hx of breast cancer. We discussed the different risk factors which can contribute to breast health changes and the tools used to determine risk. By the Alisa model her risk was found to be:      5--Year Risk of Developing Breast Cancer  Patient Risk  4.5%  Average Risk  1.6%  Based on the information provided, the patient's estimated risk for developing invasive breast cancer over the next 5 years is 4.5%, presented in red since hers is higher than the average risk of 1.6% (presented in blue) for women of the same age and race/ethnicity in the general [place] population.  Lifetime Risk of Developing Breast Cancer  Patient Risk  24.7%  Average Risk  9.8%  Based on the information provided, the woman's estimated risk for developing invasive breast cancer over her lifetime (to age 41) is 24.7%, presented in red since hers is higher than the average risk of 9.8% (presented in blue) for women of the same age and race/ethnicity in the general [place] population.      We reviewed the NCCN guidelines for high risk which includes annual mammograms, clinical exams and the consideration of breast MRI.     Because of the patient' history and dense breast pattern, I would recommend an MRI. The details of MRI imaging were fully explained. MRI utilizes an injection of gadavist , an imaging agent, that is picked up by cancer cells differently than benign tissue. Therefore, a kinetic curve and a picture are obtained that see things differently than mammogram. It is very useful for detecting breast cancers in the high risk female. There is a very high false positive rate, however, when  doing an MRI. This means that many women will be called back in for additional imaging - ultrasound, etc., and even biopsy of tissue that will not turn out to be cancer. However, in high risk women, there is up to a 15% incidence in finding new breast cancers that other screening did not find. And this can change surgical management. An MRI can be very time consuming. Some women become claustrophobic during it. And there may be follow up exams necessary. MRI can also slow done the time to get the patient's surgery scheduled.    Tamoxifen for prevention was discussed with the patient. The details of the NSABP prevention trial showing a 49% reduction in breast cancer versus placebo with the use of tamoxifen was explained. For patients with atypia and LCIS, that reduction was over 70%. Tamoxifen also positively modulates the bones and so protects against osteopenia. Risks of tamoxifen include DVT, PE, stroke, cataracts, hot flashes, ovarian cysts, change in periods, vaginal dryness, depression and in the post menopausal patient, uterine cancer. Raloxifene, or Evista, is now preferred by the FDA for postmenopausal patients as there is no increased risk of uterine cancer. Other risks and benefits are the same as tamoxifen. We also discussed the use of exesmestane which was shown to also reduce risk of breast cancer. It is an aromatase inhibitor used ONLY for postmenopausal patients has very few grave side effects. However because of the complete blockade of estrogen its use can an lead to osteopenia, joint pain, severe sexual dysfunction, hair loss, hot flashes, etc. I would favor raloxifene due to the protection of bone density and the rare side effects.     Raloxifene 60mg  daily is now preferred by the FDA based on NSABP STAR trial (P-2) for post-menopausal women. Raloxifene behaves as estrogen receptor antagonist in the breast and uterus, but acts as an agonist in the bone. Raloxifene is considered equally effective  in reducing risk of invasive breast cancer. Non-invasive disease was better reduced with tamoxifen vs raloxifene, although this difference was not statistically signification (RR 1.4). Notably, the rates for uterine cancer, DVT/PE, and cataracts were all less with raloxifene. It also generally tolerated better than tamoxifen. Other risks of other cancers, stroke, fracture, and ischemic heart disease are similar for both drugs.     Therapy is recommended for 5 years. There is no data to suggest additional benefit for prevention beyond 5 years.     She is advised first to work on coming off HRT when possible  and try other interventions for her menopausal symptoms.   If she comes off well and is feeling better, she could consider risk reduction with raloxifene in the future.     She will plan for breast MRI in June and she will be due for mammogram in December and she can f/u with us  at the time for high risk check.     The risks, benefits and alternatives of the planned course of care have been discussed with the patient and/or her legal representative, all questions have been answered and they agree to proceed.    Note Author: Jerrell Debby Rondo, NP

## 2023-08-01 ENCOUNTER — Ambulatory Visit (INDEPENDENT_AMBULATORY_CARE_PROVIDER_SITE_OTHER): Payer: BLUE CROSS/BLUE SHIELD | Admitting: Student in an Organized Health Care Education/Training Program

## 2023-08-01 ENCOUNTER — Encounter (INDEPENDENT_AMBULATORY_CARE_PROVIDER_SITE_OTHER): Payer: Self-pay | Admitting: Student in an Organized Health Care Education/Training Program

## 2023-08-01 VITALS — BP 101/69 | HR 82 | Temp 97.7°F | Resp 18 | Ht 69.0 in | Wt 209.0 lb

## 2023-08-01 DIAGNOSIS — F419 Anxiety disorder, unspecified: Secondary | ICD-10-CM

## 2023-08-01 DIAGNOSIS — R10811 Right upper quadrant abdominal tenderness: Secondary | ICD-10-CM

## 2023-08-01 DIAGNOSIS — Z9189 Other specified personal risk factors, not elsewhere classified: Secondary | ICD-10-CM

## 2023-08-01 DIAGNOSIS — R17 Unspecified jaundice: Secondary | ICD-10-CM

## 2023-08-01 DIAGNOSIS — Z0000 Encounter for general adult medical examination without abnormal findings: Secondary | ICD-10-CM

## 2023-08-01 DIAGNOSIS — E039 Hypothyroidism, unspecified: Secondary | ICD-10-CM

## 2023-08-01 MED ORDER — LEVOTHYROXINE SODIUM 100 MCG OR TABS
100.0000 ug | ORAL_TABLET | Freq: Every day | ORAL | 0 refills | Status: DC
Start: 2023-08-01 — End: 2023-08-18

## 2023-08-01 MED ORDER — LORAZEPAM 1 MG OR TABS
ORAL_TABLET | ORAL | 0 refills | Status: AC
Start: 2023-08-01 — End: ?

## 2023-08-01 NOTE — Progress Notes (Signed)
 Internal Medicine Primary Care Clinic Note    Chief Complaint   Patient presents with    Follow Up     Hormone patch and thyroid     Physical       SUBJECTIVE::Brittany [name] is a 58 year old female here for annual exam.    Vasomotor symptoms due to menopause on estradiol  patch  Hyperlipidemia, unspecified hyperlipidemia type On rosuvastatin .  Acquired hypothyroidism  Prediabetes  Family history of breast cancer  Osteopenia: On Ca and vit D supplements.    Sister is getting tested for mutation panel for breast cancer.    HRT has helped brain fog.  Since she is not getting enough exercise, she feels that she is not getting enough sleep. Used to walk 4-5 times a week, now 2 times a week.    Has been tested for OSA in the past and negative.  1-2 times out of the week has insomnia. Trouble getting to sleep only. Goes to sleep after midnight vs 10:30 pm. Is therefore sleeping 6-7 hours.    Doing the [facility] diet but not activlely particpaing, started in Dec-Jan.    Prominence at the R achilles / back of the heel. No pain. CTM and avoid excess to the area.    ROS  As above      A/P:  Brittany [name] is a 58 year old female here for annual exam.      Brittany [name] was seen today for follow up and physical.    Diagnoses and all orders for this visit:    Encounter for routine adult medical examination    Acquired hypothyroidism  -     levothyroxine  (SYNTHROID ) 100 MCG tablet; Take 1 tablet (100 mcg) by mouth every morning (before breakfast).  -     TSH, Blood - See Instructions; Future    Gilbert syndrome    Anxiety  -     LORazepam  (ATIVAN ) 1 MG tablet; Take 1 tablet 30 to 90 minutes before procedure. May take another 0.5 tablet or 1 tablet after 30 to 60 minutes if incomplete response.    Serum total bilirubin elevated  -     US  Abdomen Limited; Future    At high risk for breast cancer    Right upper quadrant abdominal tenderness without rebound tenderness  -     US  Abdomen Limited; Future        CMP with slightly  elevated T bili 1.49, indirect bilirubinemia based on prior direct bilirubins and therefore Gilbert syndrome. Unremarkable hemolysis tests. However, given mild RUQ tenderness today, will obtain RUQ US .  TSH slightly elevated 4.65. increase levothyroxine .  A1c normal at 5.3%. No longer prediabetes  Normal lipid panel. Normal vitamin D  level.    She saw the Breast Clinic and was found to be at high risk for breast cancer and is undergoing a breast MRI in June. She is on a low dose of HRT, which has been helping vasomotor symptoms of menopause. Breast clinic advised coming off HRT when possible. The lowest dose was not as effective. We decided that we would re-discuss coming off HRT after her breast MRI.  Rx for Ativan  to take prior to MRI.    The 10-year ASCVD risk score (Arnett DK, et al., 2019) is: 1.2%    Values used to calculate the score:      Age: 89 years      Sex: Female      Is Non-Hispanic African American: No  Diabetic: No      Tobacco smoker: No      Systolic Blood Pressure: 101 mmHg      Is BP treated: No      HDL Cholesterol: 63 mg/dL      Total Cholesterol: 171 mg/dL      Patient Instructions   Increase levothyroxine  to 100 mcg daily and repeat TSH in 2 months.      Return in about 1 year (around 07/31/2024).      Barriers to Learning assessed: none. Patient verbalizes understanding of teaching and instructions and is agreeable to above plan.    ________    OBJECTIVE:  Physical Exam  BP 101/69   Pulse 82   Temp 97.7 F (36.5 C)   Resp 18   Ht 5' 9 (1.753 m)   Wt 94.8 kg (209 lb)   LMP 08/19/2019   SpO2 98%   BMI 30.86 kg/m   GEN: WDWN, NAD  EYES: Pupils equal and responsive to light.  Extraocular movements intact.  HEAD: TM normal b/l.  Throat, oral cavity and tongue normal.    NECK: Neck supple. No adenopathy or masses in the neck or supraclavicular regions.   CHEST:  Clear, good air entry, no wheezes, rhonchi or rales.   HEART:  Regular rate and rhythm. S1 and S2 normal, no murmurs,  clicks, gallops or rubs.    ABDOMEN:  Soft; Mild RUQ tenderness. No rebound, guarding, mass or organomegaly.  BS+  EXTREMITIES:  WWP, no LE edema; prominence at the R dorsal calcaneous. Intact strength R foot.  SKIN:  deferred given recent TBSE through [facility]: Oriented to person, place and time. Normal affect and judgment.       I have reviewed the most recent lab and imaging data.      Lab Results   Component Value Date    WBC 9.1 10/13/2022    RBC 4.23 10/13/2022    HGB 13.3 10/13/2022    HCT 38.9 10/13/2022    MCV 92.0 10/13/2022    MCHC 34.2 10/13/2022    RDW 12.0 10/13/2022    PLT 185 10/13/2022    MPV 11.1 10/13/2022       Lab Results   Component Value Date    NA 142 07/23/2023    K 3.9 07/23/2023    CL 106 07/23/2023    BICARB 26 07/23/2023    BUN 15 07/23/2023    CREAT 0.86 07/23/2023    GLU 111 (H) 07/23/2023    CA 9.2 07/23/2023       Lab Results   Component Value Date    AST 15 07/23/2023    ALT 16 07/23/2023    LDH 186 (H) 07/23/2023    ALK 76 07/23/2023    TP 6.8 07/23/2023    ALB 4.6 07/23/2023    TBILI 1.49 (H) 07/23/2023    DBILI 0.3 (H) 08/02/2022       Lab Results   Component Value Date    CHOL 171 07/23/2023    HDL 63 07/23/2023    LDLCALC 90 07/23/2023    TRIG 97 07/23/2023        Lab Results   Component Value Date    A1C 5.3 07/23/2023    A1C 5.8 08/02/2022    A1C 5.4 02/25/2020        Lab Results   Component Value Date    TSH 4.65 (H) 07/23/2023         Problem list updated  today:   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    Inadequate exercise - not at goal 06/29/2021    Chronic midline low back pain without sciatica 10/17/2015    Hyperlipidemia, unspecified hyperlipidemia type 09/08/2015    Impingement syndrome, shoulder, right 05/02/2015    Radial styloid tenosynovitis 05/02/2015    Allergic rhinitis due to pollen 04/11/2014    Gastroesophageal reflux disease without esophagitis 04/11/2014    Bilateral low back pain without sciatica 09/21/2013     Tension headache 12/26/2011    Gluten intolerance 09/17/2011    Dyspepsia 07/15/2011    Intestinal gas excretion 07/15/2011    Right knee pain 03/13/2011    Hearing loss 12/14/2010    Cerumen impaction 12/14/2010    Skin lesion 12/05/2010    Palpitations 10/02/2010    Fatigue 06/15/2010    Depression 07/25/2009    Sebaceous cyst 03/23/2009    Insomnia 11/16/2008    Acquired hypothyroidism 09/22/2008    Right foot pain 08/18/2008    Grave's disease 01/15/2008    Vitamin D  deficiency 01/06/2008    Benign neoplasm of skin, site unspecified 08/27/2007       Past medical history, surgical history, and family history reviewed and updated today as below.  Allergies and medications reviewed and updated as below.    Past Medical History:   Diagnosis Date    Hypothyroidism     h/o hyperthyroidism s/p radioactive iodine    Infectious mononucleosis 08/19/2006    Insomnia     Menorrhagia        Past Surgical History:   Procedure Laterality Date    left wrist tendon repair  05/20/1996    CHOLECYSTECTOMY, LAP  05/20/1993    PB REMOVE TONSILS/ADENOIDS,<12 Y/O  05/20/1973    CHOLECYSTECTOMY  1997    PB MYOMECTOMY 1-4 MYOMAS 250 GM/< VAGINAL APPR      wisdom teeth         Family History   Problem Relation Name Age of Onset    Breast Cancer Mother Eliyah Mcshea - deceased 43    Cancer Mother Maryfer Tauzin - deceased         Stage 4 breast cancer    Hypertension Father Mr. SAMANTHA RAGEN - deceased     Other Sister Stefhanie Kachmar - deceased         Uterine fibroids/hysterectomy    Crohn's Disease Sister Novalyn Lajara - deceased     Cancer Sister Tierney Behl - deceased         Endometrial cancer    Breast Cancer Sister Elveria Fridge 40        DCIS    Hypertension Sister Elveria Fridge         Hypertension / obese    Endometrial Cancer Sister Bethanne Mule     Hypertension Sister      Cholesterol/Lipid Disorder Sister      Heart Disease Sister Monzerrath Mcburney         Stent    Diabetes Sister Lyris Hitchman         Adult onset    Hypertension Sister Jeniyah Menor     Thyroid  Sister Arriana Lohmann     Breast Cancer P Aunt      Ovarian Cancer Neg Hx          but sister w/ovarian cysts age 31       Social History:  none     Socioeconomic  History    Marital status: Divorced   Occupational History    Occupation: technical brewer   Tobacco Use    Smoking status: Former     Current packs/day: 0.00     Types: Cigarettes     Quit date: 10/19/1998     Years since quitting: 24.8     Passive exposure: Past    Smokeless tobacco: Never    Tobacco comments:     2nd hand smoke   Substance and Sexual Activity    Alcohol use: Yes     Alcohol/week: 3.0 standard drinks of alcohol     Types: 3 Glasses of wine per week    Drug use: No    Sexual activity: Not Currently     Partners: Male     Birth control/protection: Condom   Other Topics Concern    Military Service No    Blood Transfusions No    Caffeine Concern No    Occupational Exposure No    Hobby Hazards No    Sleep Concern Yes    Stress Concern Yes    Weight Concern No    Special Diet Yes     Comment: Gluten sensitivity, mother and sister are Celiac    Back Care Yes    Exercises Regularly Yes    Bike Helmet Use Yes    Seat Belt Use Yes    Performs Self-Exams No   Social History Narrative    Divorced and no kids. 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     2020: Sister died of endometrial cancer at 38 yo.     Social Determinants of Health     Food Insecurity: No Food Insecurity (12/10/2022)    Hunger Vital Sign     Worried About Running Out of Food in the Last Year: Never true     Ran Out of Food in the Last Year: Never true   Transportation Needs: No Transportation Needs (12/10/2022)    PRAPARE - Transportation     Lack of Transportation (Medical): No     Lack of Transportation (Non-Medical): No    Exercise is Medicine (EIM)   Intimate Partner Violence: Low Risk  (10/21/2022)    UC IPV     Have you ever been emotionally or physically abused by your partner or someone important to you?: No     Within the  last year have you been hit, slapped, kicked or otherwise physically hurt by someone?: No     Within the last year has anyone forced you to have sexual activities?: No     Are you afraid of your spouse or partner you listed above?: No   Housing Stability: Unknown (12/10/2022)    Housing Stability Vital Sign     Unable to Pay for Housing in the Last Year: Patient declined     Number of Places Lived in the Last Year: 1     Unstable Housing in the Last Year: No     Social History     Social History Narrative    Divorced and no kids.  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     2020: Sister died of endometrial cancer at 24 yo.       Allergies:  Allergies   Allergen Reactions    Vicodin [Hydrocodone-Acetaminophen ] Hallucinations        acetaminophen  Take 2 tablets (650 mg) by mouth every 4 hours as needed for Mild  Pain (Pain Score 1-3).      albuterol  Inhale 2 puffs by mouth every 6 hours as needed for Wheezing. 18 g 0    azelastine  Spray 1 spray into each nostril 2 times daily. Use in each nostril as directed 90 mL 1    CALCIUM -MAGNESIUM -ZINC  PO       cetirizine  Take 1 tablet (10 mg) by mouth daily. 90 tablet 3    Cholecalciferol (VITAMIN D3 PO) Take 1,000 Int'l Units by mouth daily.      cyclobenzaprine  Take 1 tablet (5 mg) by mouth 3 times daily as needed for Muscle Spasms. 30 tablet 0    desvenlafaxine  Take 1 tablet (50 mg) by mouth daily. 90 tablet 4    desvenlafaxine  Succinate ER Take 1 tablet (25 mg) by mouth daily. 90 tablet 4    diclofenac  Apply 4 g topically 4 times daily as needed (pain). 1 each 3    estradiol  Apply 1 patch topically twice a week. 24 patch 0    fluticasone  propionate Spray 1 spray into each nostril 2 times daily. 1 bottle 5    levothyroxine  Take 1 tablet (100 mcg) by mouth every morning (before breakfast). 90 tablet 0    LORazepam  Take 1 tablet 30 to 90 minutes before procedure. May take another 0.5 tablet or 1 tablet after 30 to 60 minutes if  incomplete response. 2 tablet 0    metroNIDAZOLE  Apply 1 Application topically daily. Use a small amount as directed 1 each 11    progesterone  Take 1 capsule (100 mg) by mouth nightly. 90 capsule 3    rosuvastatin  Take 1 tablet (10 mg) by mouth daily. 90 tablet 3    tretinoin  Apply a thin layer at bedtime as directed 1 Tube 3

## 2023-08-01 NOTE — Patient Instructions (Signed)
 Increase levothyroxine to 100 mcg daily and repeat TSH in 2 months.

## 2023-08-07 ENCOUNTER — Other Ambulatory Visit (INDEPENDENT_AMBULATORY_CARE_PROVIDER_SITE_OTHER): Payer: Self-pay | Admitting: Student in an Organized Health Care Education/Training Program

## 2023-08-07 DIAGNOSIS — E785 Hyperlipidemia, unspecified: Secondary | ICD-10-CM

## 2023-08-08 MED ORDER — ROSUVASTATIN CALCIUM 10 MG OR TABS
10.0000 mg | ORAL_TABLET | Freq: Every day | ORAL | 3 refills | Status: AC
Start: 2023-08-08 — End: ?

## 2023-08-08 NOTE — Telephone Encounter (Signed)
 Duplicate request(s) -  This encounter will be closing. Please see alternative telephone encounter.

## 2023-08-08 NOTE — Telephone Encounter (Signed)
 Brittany [name], CPhT  (Rx Refill and PA Clinic)      Statin Refill Protocol    Last visit in enc specialty: 08/01/2023     Recent Visits in This Encounter Department       Date Provider Department Visit Type Primary Dx    08/01/2023 Gala Eva Olszewski, MD [facility] - [facility] Internal Medicine Office Visit Encounter for routine adult medical examination    05/02/2023 Hakim, Eva Olszewski, MD [facility] - [facility] Internal Medicine Office Visit Vasomotor symptoms due to menopause    12/11/2022 Hakim, Eva Olszewski, MD [facility] - [facility] Internal Medicine Office Visit Family history of osteoporosis    10/21/2022 Gala Eva Olszewski, MD [facility] - [facility] Internal Medicine Office Visit History of epigastric pain    08/02/2022 Hakim, Eva Olszewski, MD [facility] Internal Medicine Office Visit Encounter for routine adult medical examination           Population Health Visits  Recent [facility] Visits    None       Next f/u appt due: Return in about 1 year (around 07/31/2024).  Next appt in enc specialty: Visit date not found      Future Appointments 08/08/2023 - 08/06/2028        Date Visit Type Department Provider     08/18/2023  8:30 AM US  ABDOMEN LIMITED K[facility] ULTRASOUND KOP US  3    Appointment Notes:     08/01/2023 s/w pt, date, time, loc, prep given-EM             10/24/2023  4:00 PM MRI BREAST BILATERAL WO/W CONTRAST [facility] MRI K[facility] MRI 1    Appointment Notes:     07/31/23 sched w/ pt, conf loc/check in, gave prep -ss             10/30/2023  8:00 AM [facility] [place] [facility] OPHTHALMOLOG[facility] Field, Visual    Appointment Notes:     Return in about 6 months (around 10/30/2023) for HVF 24-2, OCT, IOP Check             10/30/2023  8:30 AM RETURN OPHTHALMOLOGY [place] [facility] Babara Niels GRADE, OD    Appointment Notes:     Return in about 6 months (around 10/30/2023) for HVF 24-2, OCT, IOP Check             05/04/2024  1:30 PM SCREENING  MAMMO W/ TOMO BILAT [facility] MAMMOGRAPHY KOP MAMMO 1    Appointment Notes:     07/31/23 sched w/ pt, conf loc/check in, gave prep -ss             05/04/2024  2:45 PM RETURN BREAST SURG [place] [facility] [place] Jerrell Ned, NP    Appointment Notes:     9 mo + mammo same day             05/07/2024  9:00 AM RETURN GEN DERM [place] [facility] Dermatology [facility] Vergie Glatter, MD    Appointment Notes:     [facility]                    Per OV notes from 08/01/2023: Sid)   S: Normal lipid panel. Normal vitamin D  level.  A/P: Hyperlipidemia, unspecified hyperlipidemia type  -     Lipid Panel Green Plasma Separator Tube; Future  -     Comprehensive Metabolic  Panel (CMP); Future  On rosuvastatin     LABS required:  (Q year Lipid Panel, ALT (baseline only))    Lab Results   Component Value Date    CHOL 171 07/23/2023    TRIG 97 07/23/2023    HDL 63 07/23/2023    NHDLV 108 07/23/2023    LDLCALC 90 07/23/2023    LDL 155 09/19/2003        Lab Results   Component Value Date    ALT 16 07/23/2023         Monitoring required:  (None)

## 2023-08-18 ENCOUNTER — Other Ambulatory Visit (INDEPENDENT_AMBULATORY_CARE_PROVIDER_SITE_OTHER): Payer: Self-pay | Admitting: Student in an Organized Health Care Education/Training Program

## 2023-08-18 ENCOUNTER — Ambulatory Visit
Admission: RE | Admit: 2023-08-18 | Discharge: 2023-08-18 | Disposition: A | Discharge status: Home / Self Care | Attending: Student in an Organized Health Care Education/Training Program | Admitting: Student in an Organized Health Care Education/Training Program

## 2023-08-18 DIAGNOSIS — R17 Unspecified jaundice: Secondary | ICD-10-CM | POA: Insufficient documentation

## 2023-08-18 DIAGNOSIS — E039 Hypothyroidism, unspecified: Secondary | ICD-10-CM

## 2023-08-18 DIAGNOSIS — Z9049 Acquired absence of other specified parts of digestive tract: Secondary | ICD-10-CM

## 2023-08-18 DIAGNOSIS — R10811 Right upper quadrant abdominal tenderness: Secondary | ICD-10-CM

## 2023-08-19 ENCOUNTER — Encounter (INDEPENDENT_AMBULATORY_CARE_PROVIDER_SITE_OTHER): Payer: Self-pay | Admitting: Student in an Organized Health Care Education/Training Program

## 2023-08-21 MED ORDER — LEVOTHYROXINE SODIUM 100 MCG OR TABS
100.0000 ug | ORAL_TABLET | Freq: Every day | ORAL | 0 refills | Status: AC
Start: 2023-08-21 — End: ?

## 2023-08-21 NOTE — Telephone Encounter (Addendum)
 Brittany [name], CPhT  (Rx Refill [facility] PA Clinic)      Hypothyroid Medication Refill Protocol    Last visit in enc specialty: 08/01/2023     Recent Visits in This Encounter Department       Date Provider Department Visit Type Primary Dx    08/01/2023 Diamantina Monks, MD [facility] - [facility] Internal Medicine Office Visit Encounter for routine adult medical examination    05/02/2023 Hakim, Billee Cashing, MD [facility] - [facility] Internal Medicine Office Visit Vasomotor symptoms due to menopause    12/11/2022 Hakim, Billee Cashing, MD [facility] - [facility] Internal Medicine Office Visit Family history of osteoporosis    10/21/2022 Diamantina Monks, MD [facility] - [facility] Internal Medicine Office Visit History of epigastric pain    08/02/2022 Hakim, Billee Cashing, MD [facility] Internal Medicine Office Visit Encounter for routine adult medical examination           Population Health Visits  Recent [facility] Visits    None       Next f/u appt due:  Return in about 1 year (around 07/31/2024).   Next appt in enc specialty: Visit date not found      Future Appointments 08/21/2023 - 08/19/2028        Date Visit Type Department Provider     10/24/2023  4:00 PM MRI BREAST BILATERAL WO/W CONTRAST [place] [facility] MRI KOP MRI 1    Appointment Notes:     07/31/23 sched w/ pt, conf loc/check in, gave prep -ss             10/30/2023  8:00 AM [facility] [place] [facility] OPHTHALMOLOG[facility] Field, Visual    Appointment Notes:     Return in about 6 months (around 10/30/2023) for HVF 24-2, OCT, IOP Check             10/30/2023  8:30 AM RETURN OPHTHALMOLOGY [place] [facility] Kris Hartmann, OD    Appointment Notes:     Return in about 6 months (around 10/30/2023) for HVF 24-2, OCT, IOP Check             05/04/2024  1:30 PM SCREENING MAMMO W/ TOMO BILAT KOP MAMMOGRAPHY KOP MAMMO 1    Appointment Notes:     07/31/23 sched w/ pt, conf loc/check in, gave prep -ss              05/04/2024  2:45 PM RETURN BREAST SURG [place] [facility] Breast Health Mable Paris, NP    Appointment Notes:     9 mo + mammo same day             05/07/2024  9:00 AM RETURN GEN DERM [place] [facility] Dermatology [facility] Natasha Bence, MD    Appointment Notes:     [facility]                      Per OV note on 08/01/2023:  HPI: Vasomotor symptoms due to menopause on estradiol patch  Hyperlipidemia, unspecified hyperlipidemia type On rosuvastatin.  Acquired hypothyroidism  Prediabetes  Family history of breast cancer  Osteopenia: On [place] and vit D supplements.  A/P: Acquired hypothyroidism  -     levothyroxine (SYNTHROID) 100 MCG tablet; Take 1 tablet (100 mcg) by mouth every morning (before breakfast).  -     TSH, Blood - See Instructions;  Future      **If no recent dose changes, and last TSH (and T4 for Liothyronine only) normal/within date, no recent notes will be reviewed per Pharmacy Dept protocol**      LABS required:  (Q year TSH)  *If recent dose change: check TSH 2 months after starting new  dose.  *RPh - If TG/TGAb ordered previously, and ordering TSH, verify [place] diag and add Thyroglobulin w Reflex to LC-MS/MS or CIA -AND- SPECIFY THE LAB LOCATION!    Lab Results   Component Value Date    TSH 4.65 (H) 07/23/2023    TSH 1.91 10/07/2022    TSH 0.16 (L) 08/02/2022    FREET4 1.82 (H) 11/25/2014       No results found for: "THYG", "THYROGLOB", "THYROGLOBLN", "THYROBLOB", "THYROGLOBULI", "TGRIA", "ARUPTHYRO2", "ARUPTHYRO3"    No results found for: "THYAB", "TGAB", "ARUPTHYRO1"    Per Notes on Labs 07/23/2023      Monitoring required:  (Q year BP, HR, Wt)   *If pt is pregnant, DEFER TO MD*    (BP range: Systolic=90-150  UXLKGMWNU=27-25)  Blood Pressure   08/01/23 101/69   07/31/23 123/80   07/01/23 129/83       (HR range: 55-110)   Pulse Readings from Last 3 Encounters:   08/01/23 82   07/31/23 102   07/01/23 103       Wt Readings from Last 3 Encounters:   08/01/23 94.8 kg (209 lb)   07/31/23 94.8 kg (209 lb)   07/31/23 94.9  kg (209 lb 3.5 oz)         Last [employer] Monitoring Vitals:        Last MyChart BP Values:        Last Pt Entered MyChart BP Values:         The above technician note was evaluated by the pharmacist.  Pharmacist Assessment and Plan:    90ds+0. Pt to repeat labs 09/2023, per appt 08/01/23:    Patient Instructions   Increase levothyroxine to 100 mcg daily and repeat TSH in 2 months.    Scarlette Shorts, PharmD  [facility]   Refill and Prior Auth Clinical Services  Phone:  [phone]  Ext:  [phone]

## 2023-08-22 ENCOUNTER — Telehealth (INDEPENDENT_AMBULATORY_CARE_PROVIDER_SITE_OTHER): Payer: Self-pay | Admitting: Student in an Organized Health Care Education/Training Program

## 2023-08-22 NOTE — Telephone Encounter (Signed)
 I contacted pt via phone to re-discuss HRT.  Upon re-review of guidelines / recommendations, it is recommended that she avoid HRT given her high lifetime risk for breast cancer of >20%, and 4.5% five-year risk for breast cancer.  Pt is agreeable to stopping the estradiol patch and progesterone. HRT has been helping her get better sleep, but she is also participating in a series of 4 classes on sleep through [place].  She has not had a hot flash in a long time.  Pt is still on a low dose of estradiol, so will stop the patch without tapering. She will monitor for worsening of sxs off HRT, and will schedule a visit to discuss a trial of Gabapentin if hot flashes are bothersome.  She will continue Pristiq, which was started several years ago for vasomotor sxs, and which helps with mood as well.  Pt verbalized understanding of plan. All questions answered.

## 2023-09-14 ENCOUNTER — Encounter (INDEPENDENT_AMBULATORY_CARE_PROVIDER_SITE_OTHER): Payer: Self-pay | Admitting: Student in an Organized Health Care Education/Training Program

## 2023-09-14 DIAGNOSIS — M79671 Pain in right foot: Secondary | ICD-10-CM

## 2023-09-14 DIAGNOSIS — M7660 Achilles tendinitis, unspecified leg: Secondary | ICD-10-CM

## 2023-09-15 NOTE — Telephone Encounter (Signed)
 Routing to Dr. Hyland Mailman for review and advise.     Pended referral (patient c/o left heel pain too)  Please adjust and review pended order, approve/deny as you see fit and sign if appropriate. Thank you.

## 2023-09-23 ENCOUNTER — Telehealth (HOSPITAL_BASED_OUTPATIENT_CLINIC_OR_DEPARTMENT_OTHER): Payer: Self-pay | Admitting: Nurse Practitioner

## 2023-09-23 NOTE — Telephone Encounter (Signed)
 LVM confirming new apt  Also, patient was asked to reschedule her mammogram.  Pt to call breast team with any concerns

## 2023-10-15 NOTE — Progress Notes (Signed)
 HPI: GLC Suspect  - Here for HVF 24-2 OU, OCT OU, and IOP Check   - GLC DFE 01/28/2023    Past Ocular History:  OD OS   (+) GLC Suspect  (+) Mild Cataracts (+) GLC Suspect  (+) Mild Cataracts  (+) Retinal Lattice       Tmax   < 22    22    Ttarget < -    -    Pachy < 544    558       Glaucoma Risk Factors  IOP>22, Family HX, Disc appearance , and High myopia (>6D)    Discontinued Medications/Allergies  Vicodin    Family Ocular History  (+) GLC - Mother, Father, Sister  (+) RD - Sister    Relevant Past Medical History  hyperlipidemia, hypothyroid, arthritis, pre-diabetes  _________________________________________________________________    Glaucoma Testing    HVF  (10/30/2023)   OD (24-2, stim III): Borderline reliable, full, Inferior scatter, stable. FP 11%. PD >TD.  OS (24-2, stim III): Reliable, full, Inferior scatter, stable. PD > TD.    OCT RNFL  (10/30/2023)   OD: Superior  Thinning, stable. myopic temp bundle shift  OS: Borderline, Inferior  Thinning, stable. myopic temp bundle shift    OCT GCIPL  (10/30/2023)   OD: Borderline, Diffuse  Thinning, stable.  OS: Borderline, Diffuse  Thinning, stable.  _________________________________________________________________      Assessment and Plan    Glaucoma Suspect OU (IOP, +FHx, ON, Myopia)  Current medications: Refresh ATs prn    No GLC gtts   IOP acceptable for now OD, acceptable for now OS  Clinical Exam / OCT / HVF stable OU   Pt edu on findings.     Plan: Monitor for now     RTC in 6 months with HVF 24-2 OU, DFE  OU, OCT OU, and IOP Check       Trace Cataracts  - Pt edu on findings.  - Not visually significant at this time.  - Update MRx in optom clinic as needed. Monitor.    Peripheral CRA / Pavingstones OU  Retinal Lattice OS  PVD OS, non-acute  - Pt edu on findings  - Monitor    RD precautions given, rtc STAT if occurs  1. Increase in flashing lights  2. Increase in floaters  3. Curtain or shadow over vision

## 2023-10-19 ENCOUNTER — Other Ambulatory Visit (INDEPENDENT_AMBULATORY_CARE_PROVIDER_SITE_OTHER): Payer: Self-pay | Admitting: Student in an Organized Health Care Education/Training Program

## 2023-10-19 DIAGNOSIS — J309 Allergic rhinitis, unspecified: Secondary | ICD-10-CM

## 2023-10-20 ENCOUNTER — Other Ambulatory Visit (INDEPENDENT_AMBULATORY_CARE_PROVIDER_SITE_OTHER): Payer: Self-pay | Admitting: Student in an Organized Health Care Education/Training Program

## 2023-10-20 DIAGNOSIS — G8929 Other chronic pain: Secondary | ICD-10-CM

## 2023-10-21 MED ORDER — AZELASTINE HCL 137 MCG/SPRAY NA SOLN
1.0000 | Freq: Two times a day (BID) | NASAL | 3 refills | Status: AC
Start: 2023-10-21 — End: ?

## 2023-10-21 MED ORDER — CYCLOBENZAPRINE HCL 5 MG OR TABS
5.0000 mg | ORAL_TABLET | Freq: Three times a day (TID) | ORAL | 0 refills | Status: AC | PRN
Start: 2023-10-21 — End: ?

## 2023-10-21 NOTE — Telephone Encounter (Signed)
 Last visit in this department 08/01/2023  Next visit in this department Visit date not found   LR:08/02/2022

## 2023-10-21 NOTE — Telephone Encounter (Signed)
 Established with:  Last OV with PCP:  Next OV with Dept: Brittany [name]   08/01/2023  Visit date not found     Medication requested:   Requested Prescriptions     Pending Prescriptions Disp Refills    cyclobenzaprine  (FLEXERIL ) 5 MG tablet [Pharmacy Med Name: Cyclobenzaprine  Hydrochloride 5 Mg Tab Unic] 30 tablet 0     Sig: Take 1 tablet (5 mg) by mouth 3 times daily as needed for Muscle Spasms.       Last Filled Date 08/02/23   Quantity Last Filled  30 tablet   Refill 0   Send to:    [id number] - [place], [place] - [address]  [address]  [place] [address]  Phone: [phone] Fax: [phone] Alternate Fax: [phone]    [facility]  [address]  [place] [address]  Phone: [phone] Fax: [phone]      Current Medication(s):   acetaminophen  Take 2 tablets (650 mg) by mouth every 4 hours as needed for Mild Pain (Pain Score 1-3).      albuterol  Inhale 2 puffs by mouth every 6 hours as needed for Wheezing. 18 g 0    azelastine  Spray 1 spray into each nostril 2 times daily. Use in each nostril as directed 90 mL 3    CALCIUM -MAGNESIUM -ZINC  PO       cetirizine  Take 1 tablet (10 mg) by mouth daily. 90 tablet 3    Cholecalciferol (VITAMIN D3 PO) Take 1,000 Int'l Units by mouth daily.      cyclobenzaprine  Take 1 tablet (5 mg) by mouth 3 times daily as needed for Muscle Spasms. 30 tablet 0    desvenlafaxine  Take 1 tablet (50 mg) by mouth daily. 90 tablet 4    desvenlafaxine  Succinate ER Take 1 tablet (25 mg) by mouth daily. 90 tablet 4    diclofenac  Apply 4 g topically 4 times daily as needed (pain). 1 each 3    fluticasone  propionate Spray 1 spray into each nostril 2 times daily. 1 bottle 5    levothyroxine  Take 1 tablet (100 mcg) by mouth every morning (before breakfast). 90 tablet 0    LORazepam  Take 1 tablet 30 to 90 minutes before procedure. May take another 0.5 tablet or 1 tablet after 30 to 60 minutes if incomplete response. 2 tablet 0     metroNIDAZOLE  Apply 1 Application topically daily. Use a small amount as directed 1 each 11    rosuvastatin  Take 1 tablet (10 mg) by mouth daily. 90 tablet 3    tretinoin  Apply a thin layer at bedtime as directed 1 Tube 3       Patient information: Allergies   Allergen Reactions    Vicodin [Hydrocodone-Acetaminophen ] Hallucinations        Health Maintenance Due   Topic Date Due    Pneumococcal Vaccine (1 of 1 - PCV) Never done    PHQ9 Depression Monitoring  08/31/2023      Last BP:  Blood Pressure   08/01/23 101/69   07/31/23 123/80   07/01/23 129/83      Last labs:  Results for orders placed or performed in visit on 07/23/23   Vitamin D , 25-OH Total Yellow serum separator tube   Result Value Ref Range    Vitamin D , 25-Hydroxy 35 30 - 80 ng/mL   Comprehensive Metabolic Panel (CMP)   Result Value Ref Range    Glucose 111 (H) 70 - 99  mg/dL    BUN 15 6 - 20 mg/dL    Creatinine 9.13 9.48 - 0.95 mg/dL    eGFR Based on CKD-EPI 2021 Equation >60 mL/min/1.73 m2    Sodium 142 136 - 145 mmol/L    Potassium 3.9 3.5 - 5.1 mmol/L    Chloride 106 98 - 107 mmol/L    Bicarbonate 26 22 - 29 mmol/L    Anion Gap 10 7 - 15 mmol/L    Calcium  9.2 8.5 - 10.6 mg/dL    Total Protein 6.8 6.0 - 8.0 g/dL    Albumin 4.6 3.5 - 5.2 g/dL    Bilirubin, Tot 8.50 (H) <1.2 mg/dL    AST (SGOT) 15 0 - 32 U/L    ALT (SGPT) 16 0 - 33 U/L    Alkaline Phos 76 40 - 130 U/L   Glycosylated Hgb(A1C), Blood   Result Value Ref Range    Glyco Hgb (A1C) 5.3 4.8 - 5.8 %   TSH, Blood - See Instructions   Result Value Ref Range    TSH 4.65 (H) 0.27 - 4.20 uIU/mL   Lipid Panel Green Plasma Separator Tube   Result Value Ref Range    Cholesterol 171 <200 mg/dL    HDL-Cholesterol 63 mg/dL    LDL-Chol (Calc) 90 <839 mg/dL    Non-HDL Cholesterol 108 mg/dL    Triglycerides 97 10 - 170 mg/dL   Reticulocyte Automated   Result Value Ref Range    Retic %, Auto 1.8 0.5 - 1.8 %    Retic Count, Absolute 80.4 (H) 16.0 - 78.0 1000/mm3   Haptoglobin, Blood   Result Value Ref Range     Haptoglobin 118 30 - 200 mg/dL   LDH, Blood   Result Value Ref Range    LDH 186 (H) 25 - 175 U/L     Lab Results   Component Value Date    CHOL 171 07/23/2023    HDL 63 07/23/2023    LDLCALC 90 07/23/2023    TRIG 97 07/23/2023    TSH 4.65 (H) 07/23/2023    A1C 5.3 07/23/2023

## 2023-10-21 NOTE — Telephone Encounter (Signed)
 Brittany [name], CPhT  ([facility] Ref[facility])      Antihistamine Refill Protocol    Last visit in enc specialty: 08/01/2023     Recent Visits in This Encounter Department       Date Provider Department Visit Type Primary Dx    08/01/2023 Gala Eva Olszewski, MD [facility] - [facility] Internal Medicine Office Visit Encounter for routine adult medical examination    05/02/2023 Hakim, Eva Olszewski, MD [facility] - [facility] Internal Medicine Office Visit Vasomotor symptoms due to menopause    12/11/2022 Hakim, Eva Olszewski, MD [facility] - [facility] Internal Medicine Office Visit Family history of osteoporosis    10/21/2022 Hakim, Eva Olszewski, MD [facility] - [facility] Internal Medicine Office Visit History of epigastric pain    08/02/2022 Hakim, Eva Olszewski, MD [facility] Internal Medicine Office Visit Encounter for routine adult medical examination           Population Health Visits  Recent [facility] Visits    None       Next appt in enc specialty: Visit date not found      Future Appointments 10/21/2023 - 10/19/2028        Date Visit Type Department Provider     10/22/2023  3:30 PM PT EVAL [facility] Physical Therapy [place] Lorane Mering, PT             10/24/2023  4:00 PM MRI BREAST BILATERAL WO/W CONTRAST [place] [facility] MRI [facility] MRI 1    Appointment Notes:     AM [place] 04/2023             10/30/2023  8:00 AM HUMPHREY VISUAL FIELD [place] [facility] Field, Visual    Appointment Notes:     Return in about 6 months (around 10/30/2023) for HVF 24-2, OCT, IOP Check             10/30/2023  8:30 AM RETURN OPHTHALMOLOGY [place] [facility] Babara Niels GRADE, OD    Appointment Notes:     Return in about 6 months (around 10/30/2023) for HVF 24-2, OCT, IOP Check             05/07/2024  9:00 AM RETURN GEN DERM [place] [facility] Dermatology [facility] Vergie Glatter, MD    Appointment Notes:     [facility]             05/07/2024 10:00 AM SCREENING MAMMO W/ TOMO BILAT [place] [facility]  MAMMOGRAPHY MOP MAMMO ROOM 1    Appointment Notes:     07/31/23 sched w/ pt, conf loc/check in, gave prep -ss             05/07/2024 10:45 AM RETURN BREAST SURG [place] [facility] [place] Jerrell Ned, NP    Appointment Notes:     9 mo + mammo same dayLVM confirming new apt                        LABS required:  (None)    Monitoring required:  (None)\

## 2023-10-21 NOTE — Telephone Encounter (Signed)
 [place] [facility]     flexeril is not currently included in the Pharmacy Refill Clinic protocols. Re-routing to the responsible staff for processing.  Thank you

## 2023-10-22 ENCOUNTER — Ambulatory Visit (INDEPENDENT_AMBULATORY_CARE_PROVIDER_SITE_OTHER)

## 2023-10-22 DIAGNOSIS — M79671 Pain in right foot: Secondary | ICD-10-CM

## 2023-10-22 DIAGNOSIS — M7660 Achilles tendinitis, unspecified leg: Secondary | ICD-10-CM

## 2023-10-22 NOTE — Interdisciplinary (Signed)
 Physical Therapy Evaluation  Ordering Physician: Gala Eva Olszewski    Visit Diagnosis:    ICD-10-CM ICD-9-CM    1. Pain of right heel  M79.671 729.5       2. Achilles tendon pain  M76.60 727.89         Visit Number: 1     Insurance: BLUE CROSS    Past Medical History:   Diagnosis Date    Hypothyroidism     h/o hyperthyroidism s/p radioactive iodine    Infectious mononucleosis 08/19/2006    Insomnia     Menorrhagia        Past Surgical History:   Procedure Laterality Date    left wrist tendon repair  05/20/1996    CHOLECYSTECTOMY, LAP  05/20/1993    PB REMOVE TONSILS/ADENOIDS,<12 Y/O  05/20/1973    CHOLECYSTECTOMY  1997    PB MYOMECTOMY 1-4 MYOMAS 250 GM/< VAGINAL APPR      wisdom teeth         Allergies: Vicodin [hydrocodone-acetaminophen ]     acetaminophen  Take 2 tablets (650 mg) by mouth every 4 hours as needed for Mild Pain (Pain Score 1-3).      albuterol  Inhale 2 puffs by mouth every 6 hours as needed for Wheezing. 18 g 0    azelastine  Spray 1 spray into each nostril 2 times daily. Use in each nostril as directed 90 mL 3    CALCIUM -MAGNESIUM -ZINC  PO       cetirizine  Take 1 tablet (10 mg) by mouth daily. 90 tablet 3    Cholecalciferol (VITAMIN D3 PO) Take 1,000 Int'l Units by mouth daily.      cyclobenzaprine  Take 1 tablet (5 mg) by mouth 3 times daily as needed for Muscle Spasms. 30 tablet 0    desvenlafaxine  Take 1 tablet (50 mg) by mouth daily. 90 tablet 4    desvenlafaxine  Succinate ER Take 1 tablet (25 mg) by mouth daily. 90 tablet 4    diclofenac  Apply 4 g topically 4 times daily as needed (pain). 1 each 3    fluticasone  propionate Spray 1 spray into each nostril 2 times daily. 1 bottle 5    levothyroxine  Take 1 tablet (100 mcg) by mouth every morning (before breakfast). 90 tablet 0    LORazepam  Take 1 tablet 30 to 90 minutes before procedure. May take another 0.5 tablet or 1 tablet after 30 to 60 minutes if incomplete response. 2 tablet 0    metroNIDAZOLE  Apply 1 Application topically daily. Use a small  amount as directed 1 each 11    rosuvastatin  Take 1 tablet (10 mg) by mouth daily. 90 tablet 3    tretinoin  Apply a thin layer at bedtime as directed 1 Tube 3     Start of Care: 10/22/2023                          Weeks since Surgery/Injury (0 if N/A): 0    SUBJECTIVE    Pt presents to physical therapy with R heel pain that started 1 year ago, got new orthotics to wear in [place] and started feeling pressure on heel. Has had plantar fasciitis a few years ago and got better shoes and arch support that helped decrease it. Has recently noticed increased bone at heel on R. Has intermittent R knee pain and is considering a knee replacement. Had 4th metatarsal lengthening on L foot to help with hammer toes but it did not.  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 No   Have you had a respiratory infection in the last 30 days? No   In the last 14 days, have you traveled internationally or to an area  with a known measles outbreak? No     New/Return Patient Check-In Form    10/19/2023 12:06 PM PDT - Fredricka by Patient   Recent Travel Screening / Indagacin de viaje reciente   Have you traveled outside the [place]  within the past 30 days? No   Where have you traveled?    Fall Risk   1 or more of the following = Risk for Falls None of the above / [place] de las anteriores   What is your lived / chosen / preferred name? Staria   What are your pronouns? she/her/hers   What is your gender identity? Female   What is your sexual orientation? Choose not to disclose   Do we have the most current information on file regarding your:   Advance Directive No   Physician's Orders for Life Sustaining Treatment (POLST) No   Health Care Agent No     [facility] Appt Reason For Visit    10/19/2023 12:07 PM PDT - Fredricka by Patient   What is the nature of your visit? Lower Body Pain (Hip, Knee, Ankle, Foot)   LOWER EXTREMITY FUNCTIONAL SCALE   Any of your usual work, housework, or school activities.  Moderate difficulty   Your usual hobbies, re creational or sporting activities.  Moderate difficulty   Getting into or out of the bath.  No difficulty   Walking between rooms.  A little bit of difficulty   Putting on your shoes or socks.  No difficulty   Squatting.  No difficulty   Lifting an object, like a bag of groceries from the floor.  No difficulty   Performing light activities around your home.  No difficulty   Performing heavy activities around your home.  A little bit of difficulty   Getting into or out of a car.  No difficulty   Walking 2 blocks.  No difficulty   Walking a mile.  A little bit of difficulty   Going up or down 10 stairs (about 1 flight of  stairs).  No difficulty   Standing for 1 hour.  Moderate difficulty   Sitting for 1 hour.  No difficulty   Running on even ground.  Quite a bit of difficulty   Running on uneven ground.  Quite a bit of difficulty   Making sharp turns while running fast.  Quite a bit of  difficulty   Hopping.  A little bit of difficulty   Rolling over in bed.  No difficulty   [facility] Lower Extremity Functional Scale Total (range: 0 - 80) 61         Ankle AROM Left 10/22/2023 Right 10/22/2023 Pain/Response   Dorsiflexion 5 degrees -8 degrees     Plantar flexion  WNL WNL    Inversion WNL WNL    Eversion WNL WNL     Great toe extension WNL  Lacking 10 degrees     *pain during movement  ** pain at end range    Strength MMT Left 10/22/2023 Right 10/22/2023 Pain/Response   Dorsiflexion 5/5 5/5    Plantar flexion  4/5 4/5    Inversion 5/5 5/5    Eversion 5/5 5/5      Palpation:  heel spur at R   Special Tests:  Not performed   Flexibility: lacking HS length bilaterally   Knee AROM: lacking 10 degrees extension on R leg         ASSESSMENT  Rehabilitation Potential: Fair    Patient presents with R heel pain due to bone spur due to poor gait mechanics and limited ankle and knee AROM. Deficits include ankle DF AROM, limited great toe extension, limited R knee extension AROM and pain limiting the patient from ADLs such as walking and standing.  Discussed heel cushion or padding for direct pressure, gave HEP for improving AROM, worked on toe intrinsic activation and will check back in a few weeks to update HEP. Discussed following up with MD if continued pain persists for potential imaging and other care planning. Patient will benefit from skilled therapy to address impairments and improve function. Patient was given a home exercise program to begin addressing evaluation findings and was educated on how to perform effectively and safely. Patient was instructed to perform home exercise program as tolerated. Discussed evaluation findings with patient and educated on therapy potential and plan of care.    TherEx (97110):  Pt education on bone spur, padding or heel cup to alleviate pressure, HEP and potential follow up with MD   Arch doming 5 x 10   Toe yoga x 10   Prone knee hang 2 min for knee ROM   Supine hamstring  stretch with strap 2 x 30   Standing calf stretch 2 x 30    Plan for next visit: update HEP     Goals  Goal 1 Impairment: Education need  Goal 1: Patient able to return demonstrate Home Exercise Program independently to enable patient to achieve stated functional goals   Number of Visits: 1-3  Status :New    Goal 2 Impairment: Range of motion limitations  Goal 2: Pt will improve ankle DF AROM by 5 degrees  Number of Visits: 3-5  Status: New                  Long Term Goal Impairment: Range of motion limitations  Long Term Goal: Pt will improve knee ext AROM by 5 degrees  Number of Visits: 1-3  Status: New    Long Term Goal Outcome: Other -  Custom Goal  Long Term Goal: Pt will be able to walk > 20 min with decreased pain   Number of Visits: 1-3  Status: New                    PLAN  Continue therapy to address: Activity tolerance limitation;Pain;Range of motion limitation;Strength impairment   Treatment Frequency: 2 times per month   Treatment Duration: 8 weeks                                  Therapist and Patient discussed treatment plan for     ICD-10-CM ICD-9-CM    1. Pain of right heel  M79.671 729.5       2. Achilles tendon pain  M76.60 727.89        and all parties agreeable to the following recommendations.    Planned Therapy Interventions: Gait Training, Neuromuscular Re-Education, Patient Education, Therapeutic Activity, and Therapeutic Exercise    Goals of treatment reviewed with Sachiko? Yes  Home Exercise Program issued? Yes  Education provided: Home Exercise Program, Pain Modulation, and Activity Modification

## 2023-10-24 ENCOUNTER — Ambulatory Visit
Admission: RE | Admit: 2023-10-24 | Discharge: 2023-10-24 | Disposition: A | Discharge status: Home / Self Care | Attending: Nurse Practitioner | Admitting: Nurse Practitioner

## 2023-10-24 DIAGNOSIS — Z1239 Encounter for other screening for malignant neoplasm of breast: Secondary | ICD-10-CM | POA: Insufficient documentation

## 2023-10-24 DIAGNOSIS — Z803 Family history of malignant neoplasm of breast: Secondary | ICD-10-CM | POA: Insufficient documentation

## 2023-10-24 MED ORDER — GADOBUTROL 1 MMOL/ML IV SOLN (WRAPPED RECORD)
9.5000 mL | Freq: Once | INTRAVENOUS | Status: AC
Start: 2023-10-24 — End: 2023-10-24
  Administered 2023-10-24: 9.5 mL via INTRAVENOUS

## 2023-10-27 DIAGNOSIS — Z803 Family history of malignant neoplasm of breast: Secondary | ICD-10-CM

## 2023-10-27 DIAGNOSIS — Z1239 Encounter for other screening for malignant neoplasm of breast: Secondary | ICD-10-CM

## 2023-10-28 ENCOUNTER — Other Ambulatory Visit (HOSPITAL_BASED_OUTPATIENT_CLINIC_OR_DEPARTMENT_OTHER): Payer: Self-pay | Admitting: Nurse Practitioner

## 2023-10-28 DIAGNOSIS — Z803 Family history of malignant neoplasm of breast: Secondary | ICD-10-CM

## 2023-10-30 ENCOUNTER — Ambulatory Visit (INDEPENDENT_AMBULATORY_CARE_PROVIDER_SITE_OTHER): Payer: BLUE CROSS/BLUE SHIELD | Admitting: Optometrist

## 2023-10-30 ENCOUNTER — Ambulatory Visit (INDEPENDENT_AMBULATORY_CARE_PROVIDER_SITE_OTHER): Payer: BLUE CROSS/BLUE SHIELD

## 2023-10-30 DIAGNOSIS — H2513 Age-related nuclear cataract, bilateral: Secondary | ICD-10-CM

## 2023-10-30 DIAGNOSIS — H40003 Preglaucoma, unspecified, bilateral: Secondary | ICD-10-CM

## 2023-10-30 DIAGNOSIS — H35412 Lattice degeneration of retina, left eye: Secondary | ICD-10-CM

## 2023-10-30 DIAGNOSIS — H43812 Vitreous degeneration, left eye: Secondary | ICD-10-CM

## 2023-11-04 ENCOUNTER — Encounter (HOSPITAL_BASED_OUTPATIENT_CLINIC_OR_DEPARTMENT_OTHER): Payer: Self-pay

## 2023-11-05 ENCOUNTER — Ambulatory Visit (INDEPENDENT_AMBULATORY_CARE_PROVIDER_SITE_OTHER)

## 2023-11-12 ENCOUNTER — Ambulatory Visit (INDEPENDENT_AMBULATORY_CARE_PROVIDER_SITE_OTHER)

## 2023-11-12 NOTE — Interdisciplinary (Deleted)
 PHYSICAL THERAPY DAILY TREATMENT NOTE    Ordering Physician: Gala Eva Olszewski    Visit Diagnosis:    ICD-10-CM ICD-9-CM    1. Pain of right heel  M79.671 729.5       2. Achilles tendon pain  M76.60 727.89         Visit Number: 2     Insurance: BLUE CROSS    Past Medical History:   Diagnosis Date    Hypothyroidism     h/o hyperthyroidism s/p radioactive iodine    Infectious mononucleosis 08/19/2006    Insomnia     Menorrhagia        Past Surgical History:   Procedure Laterality Date    left wrist tendon repair  05/20/1996    CHOLECYSTECTOMY, LAP  05/20/1993    PB REMOVE TONSILS/ADENOIDS,<12 Y/O  05/20/1973    CHOLECYSTECTOMY  1997    PB MYOMECTOMY 1-4 MYOMAS 250 GM/< VAGINAL APPR      wisdom teeth         Allergies: Vicodin [hydrocodone-acetaminophen ]     acetaminophen  Take 2 tablets (650 mg) by mouth every 4 hours as needed for Mild Pain (Pain Score 1-3).      albuterol  Inhale 2 puffs by mouth every 6 hours as needed for Wheezing. 18 g 0    azelastine  Spray 1 spray into each nostril 2 times daily. Use in each nostril as directed 90 mL 3    CALCIUM -MAGNESIUM -ZINC  PO       cetirizine  Take 1 tablet (10 mg) by mouth daily. 90 tablet 3    Cholecalciferol (VITAMIN D3 PO) Take 1,000 Int'l Units by mouth daily.      cyclobenzaprine  Take 1 tablet (5 mg) by mouth 3 times daily as needed for Muscle Spasms. 30 tablet 0    desvenlafaxine  Take 1 tablet (50 mg) by mouth daily. 90 tablet 4    desvenlafaxine  Succinate ER Take 1 tablet (25 mg) by mouth daily. 90 tablet 4    diclofenac  Apply 4 g topically 4 times daily as needed (pain). 1 each 3    fluticasone  propionate Spray 1 spray into each nostril 2 times daily. 1 bottle 5    levothyroxine  Take 1 tablet (100 mcg) by mouth every morning (before breakfast). 90 tablet 0    LORazepam  Take 1 tablet 30 to 90 minutes before procedure. May take another 0.5 tablet or 1 tablet after 30 to 60 minutes if incomplete response. 2 tablet 0    metroNIDAZOLE  Apply 1 Application topically daily.  Use a small amount as directed 1 each 11    rosuvastatin  Take 1 tablet (10 mg) by mouth daily. 90 tablet 3    tretinoin  Apply a thin layer at bedtime as directed 1 Tube 3     Start of Care: 10/22/2023                          Weeks since Surgery/Injury/Delivery (0 if N/A): 0          SUBJECTIVE    Subjective information: ***    Pain levels:  {PainYorN:32448}         OBJECTIVE    ***    {Treatment Provided:33570}  TherEx (97110):  Pt education on bone spur, padding or heel cup to alleviate pressure, HEP and potential follow up with MD   Arch doming 5 x 10   Toe yoga x 10   Prone knee hang 2 min for knee ROM   Supine  hamstring stretch with strap 2 x 30   Standing calf stretch 2 x 30     Plan for next visit: update HEP        ASSESSMENT    Patient making *** progress as evidenced by ***. Patient presents with deficits involving *** which demonstrates the need for continued skilled care to address impairments to reach goals set in plan of care and to return to previous level of function.    Plan for next visit: ***    Goals  Goal 1 Impairment: Education need  Goal 1: Patient able to return demonstrate Home Exercise Program independently to enable patient to achieve stated functional goals   Number of Visits: 1-3  Status :New    Goal 2 Impairment: Range of motion limitations  Goal 2: Pt will improve ankle DF AROM by 5 degrees  Number of Visits: 3-5  Status: New                  Long Term Goal Impairment: Range of motion limitations  Long Term Goal: Pt will improve knee ext AROM by 5 degrees  Number of Visits: 1-3  Status: New    Long Term Goal Outcome: Other - Custom Goal  Long Term Goal: Pt will be able to walk > 20 min with decreased pain   Number of Visits: 1-3  Status: New                    PLAN  Continue therapy to address: Activity tolerance limitation;Pain;Range of motion limitation;Strength impairment   Treatment Frequency: 2 times per month   Treatment Duration: 8 weeks

## 2023-11-19 NOTE — Telephone Encounter (Signed)
 Called patient as she has not scheduled her repeat MRI yet. No answer, left message advising her to call and schedule.

## 2023-11-20 ENCOUNTER — Ambulatory Visit (INDEPENDENT_AMBULATORY_CARE_PROVIDER_SITE_OTHER)

## 2023-11-20 ENCOUNTER — Encounter (INDEPENDENT_AMBULATORY_CARE_PROVIDER_SITE_OTHER): Payer: Self-pay | Admitting: Physician Assistant

## 2023-11-20 DIAGNOSIS — Z1159 Encounter for screening for other viral diseases: Secondary | ICD-10-CM

## 2023-11-20 LAB — RSV RAPID NAAT (POCT): RSV Viral RNA: NEGATIVE

## 2023-11-20 LAB — COVID-19 RAPID NAAT (POCT): COVID-19 Rapid Assay (POCT): NOT DETECTED

## 2023-11-20 NOTE — Interdisciplinary (Signed)
 POCT Test    2 Patient Identifiers verified.    Patients Name: Brittany [name]   Patients Date of Birth: July 03, 1965   Age: 58 year old     Procedure performed for RSV and COVID and recorded in EMR for provider review and interpretation.    Margurete Guaman Ray Lorriane Sires, LVN  Signature Derived From Controlled Access Password, November 20, 2023, 11:42 AM

## 2023-11-22 ENCOUNTER — Telehealth (INDEPENDENT_AMBULATORY_CARE_PROVIDER_SITE_OTHER): Payer: Self-pay | Admitting: Student in an Organized Health Care Education/Training Program

## 2023-11-22 DIAGNOSIS — E039 Hypothyroidism, unspecified: Secondary | ICD-10-CM

## 2023-11-25 MED ORDER — LEVOTHYROXINE SODIUM 100 MCG OR TABS
100.0000 ug | ORAL_TABLET | Freq: Every day | ORAL | 0 refills | Status: DC
Start: 2023-11-25 — End: 2023-12-18

## 2023-11-25 NOTE — Telephone Encounter (Signed)
 Brittany [name], CPhT  (Rx Refill and [facility])      Hypothyroid Medication Refill Protocol    Last visit in enc specialty: 08/01/2023     Recent Visits in This Encounter Department       Date Provider Department Visit Type Primary Dx    08/01/2023 Gala Eva Olszewski, MD [facility] - [facility] Internal Medicine Office Visit Encounter for routine adult medical examination    05/02/2023 Hakim, Eva Olszewski, MD [facility] - [facility] Internal Medicine Office Visit Vasomotor symptoms due to menopause    12/11/2022 Hakim, Eva Olszewski, MD [facility] - [facility] Internal Medicine Office Visit Family history of osteoporosis    10/21/2022 Hakim, Eva Olszewski, MD [facility] - [facility] Internal Medicine Office Visit History of epigastric pain    08/02/2022 Hakim, Eva Olszewski, MD [facility] [place] [facility] Internal Medicine Office Visit Encounter for routine adult medical examination           Recommended Follow up From Recent Visits       Date Provider Department Visit Type Follow Up    08/01/2023 Hakim, Eva Olszewski, MD [facility] - [facility] Internal Medicine Office Visit Return in about 1 year (around 07/31/2024).      05/02/2023 Hakim, Eva Olszewski, MD [facility] - [facility] Internal Medicine Office Visit Return in about 3 months (around 07/31/2023) for Annual physical with labs prior.      12/11/2022 Hakim, Eva Olszewski, MD [facility] - [facility] Internal Medicine Office Visit No Follow-up on file.    10/21/2022 Hakim, Eva Olszewski, MD [facility] - [facility] Internal Medicine Office Visit No Follow-up on file.    08/02/2022 Gala Eva Olszewski, MD [facility] - [facility] Internal Medicine Office Visit Return in about 1 year (around 08/02/2023).             Population Health Visits  Recent PHSO Visits    None       Next f/u appt due:    Return in about 1 year (around 07/31/2024).  Next appt in enc specialty: Visit date not found       Future Appointments 11/25/2023 - 11/23/2028        Date Visit Type Department Provider     04/27/2024  7:00 AM RETURN OPTOMETRY [place] [facility] OPTOMETRY Mora Prentice Proper, OD    Appointment Notes:     Auth# [id number] General eye exam *vsp*             04/29/2024  8:00 AM HUMP[facility]Y VISUAL FIELD [place] [facility] OPHTHALMOLOGY VISUAL FIELD 2    Appointment Notes:     Return in about 6 months (around 04/30/2024) for HVF 24-2, OCT, IOP Check, DFE.             04/29/2024  8:30 AM RETURN OPHTHALMOLOGY [place] [facility] OPHTHALMOLOGY Babara Niels GRADE, OD    Appointment Notes:     Return in about 6 months (around 04/30/2024) for HVF 24-2, OCT, IOP Check, DFE.             05/07/2024  9:00 AM RETURN GEN DERM [place] [facility] Dermatology [facility] Vergie Glatter, MD    Appointment Notes:     [facility]             05/07/2024 10:00 AM SCREENING MAMMO W/ TOMO BILAT [place] [facility] MAMMOGRAPHY MOP MAMMO ROOM 1  Appointment Notes:     07/31/23 sched w/ pt, conf loc/check in, gave prep -ss             05/07/2024 10:45 AM RETURN BREAST SURG [place] [facility] Bronson Jerrell Ned, NP    Appointment Notes:     9 mo + mammo same dayLVM confirming new apt                      Per OV  08/01/2023  HPI-Vasomotor symptoms due to menopause on estradiol  patch  Hyperlipidemia, unspecified hyperlipidemia type On rosuvastatin .  Acquired hypothyroidism  Prediabetes  Family history of breast cancer  Osteopenia: On Ca and vit D supplements.      A/P-Acquired hypothyroidism  -     levothyroxine  (SYNTHROID ) 100 MCG tablet; Take 1 tablet (100 mcg) by mouth every morning (before breakfast).  -     TSH, Blood - See Instructions; Future         CMP with slightly elevated T bili 1.49, indirect bilirubinemia based on prior direct bilirubins and therefore Gilbert syndrome. Unremarkable hemolysis tests. However, given mild RUQ tenderness today, will obtain RUQ US .  TSH slightly elevated 4.65. increase levothyroxine .  A1c normal at 5.3%. No longer prediabetes  Normal lipid panel. Normal vitamin D   level.     Patient Instructions  Increase levothyroxine  to 100 mcg daily and repeat TSH in 2 months.        **If no recent dose changes, and last TSH (and T4 for Liothyronine only) normal/within date, no recent notes will be reviewed per Pharmacy Dept protocol**      LABS required:  (Q year TSH)  *If recent dose change: check TSH 2 months after starting new  dose.  *RPh - If TG/TGAb ordered previously, and ordering TSH, verify CA diag and add Thyroglobulin w Reflex to LC-MS/MS or CIA -AND- SPECIFY THE LAB LOCATION!    Lab Results   Component Value Date    TSH 4.65 (H) 07/23/2023    TSH 1.91 10/07/2022    TSH 0.16 (L) 08/02/2022    FREET4 1.82 (H) 11/25/2014       No results found for: THYG, THYROGLOB, THYROGLOBLN, THYROBLOB, THYROGLOBULI, TGRIA, ARUPTHYRO2, ARUPTHYRO3    No results found for: THYAB, TGAB, ARUPTHYRO1      Per lab Notes 07/23/2023          Monitoring required:  (Q year BP, HR, Wt)   *If pt is pregnant, DEFER TO MD*    (BP range: Systolic=90-150  Ipjdunopr=49-09)  Blood Pressure   08/01/23 101/69   07/31/23 123/80   07/01/23 129/83       (HR range: 55-110)   Pulse Readings from Last 3 Encounters:   08/01/23 82   07/31/23 102   07/01/23 103       Wt Readings from Last 3 Encounters:   08/01/23 94.8 kg (209 lb)   07/31/23 94.8 kg (209 lb)   07/31/23 94.9 kg (209 lb 3.5 oz)         Last Digital Health Monitoring Vitals:        Last MyChart BP Values:        Last Pt Entered MyChart BP Values:

## 2023-11-25 NOTE — Telephone Encounter (Signed)
 My-Chart message sent to patient.    Please remind pt to have labs drawn (non-fasting). Repeat TSH

## 2023-11-25 NOTE — Telephone Encounter (Signed)
 Lab Reminder:  [place] [facility]     Please remind pt to have labs drawn (non-fasting). Repeat TSH    Authorized 90 days supply + 0 RF until labs complete.

## 2023-12-17 ENCOUNTER — Encounter (INDEPENDENT_AMBULATORY_CARE_PROVIDER_SITE_OTHER): Payer: Self-pay | Admitting: Student in an Organized Health Care Education/Training Program

## 2023-12-17 ENCOUNTER — Ambulatory Visit (INDEPENDENT_AMBULATORY_CARE_PROVIDER_SITE_OTHER): Payer: Self-pay | Admitting: Student in an Organized Health Care Education/Training Program

## 2023-12-17 NOTE — Telephone Encounter (Signed)
 Routing to Dr. Gala for review only.    LOV: 08/01/2023  NOV: 12/22/2023

## 2023-12-17 NOTE — Telephone Encounter (Signed)
 PROVIDER/CLINIC ACTION REQUESTED: No, FYI only   Action item needed:   none     Chief Complaint   information call    Assessment details:   Speaking with pt.         Reports her sty is resolving . Pt has been using  warm compress and eye cloth.       No  other symptoms reported.      Reports she will keep her appnt with PCP to discuss recent lab results.     Closing encounter.    Intervention: Advice given and pt given strict UC/ED precautions and reviewed all applicable Home Care Advice per protocol.   Appt scheduled:   Future Appointments   Date Time Provider Department Center   12/18/2023  8:40 AM U[facility] DRAW STATION [facility] DRAW UPC   12/22/2023  1:00 PM Hakim, Eva Olszewski, MD UPC Int Med [facility]   04/27/2024  7:00 AM Vo, Prentice Proper, OD [facility]   04/29/2024  8:00 AM VISUAL FIELD 2 SHI Ophth [facility]   04/29/2024  8:30 AM Babara Niels GRADE, OD [facility] Ophth [facility]   05/07/2024  9:00 AM Vergie Glatter, MD UPC Derm U[facility]   05/07/2024 10:00 AM MOP MAMMO ROOM 1 MOP MAMMO MOP   05/07/2024 10:45 AM Bronson Jerrell Ned, NP K[facility] BREAST [place]     Reason for Call: Information       Disposition: Information or Advice Only Call       Reason for Disposition   Health Information question, no triage required and triager able to answer question    Additional Information   Negative: [1] Caller is not with the adult (patient) AND [2] reporting urgent symptoms   Negative: Lab result questions   Negative: Medication questions   Negative: Caller can't be reached by phone   Negative: Caller has already spoken to PCP or another triager   Negative: RN needs further essential information from caller in order to complete triage   Negative: Requesting regular office appointment   Negative: [1] Caller requesting NON-URGENT health information AND [2] PCP's office is the best resource    Answer Assessment - Initial Assessment Questions  1. REASON FOR CALL or QUESTION: What is your reason for calling today? or How can I best help you? or What question do you  have that I can help answer?         Speaking with pt.         Reports her sty is resolving . Pt has been using  warm compress and eye cloth.       No  other symptoms reported.      Reports she will keep her appnt with PCP to discuss recent lab results.     Closing encounter.    Protocols used: Information Only Call-A-AH

## 2023-12-17 NOTE — Telephone Encounter (Signed)
 Symptom Call         What symptom is the patient experiencing?   Appointment scheduled from MyChart   Brittany [name]   Sent: Sun December 14, 2023 12:29 PM   To: SHAUNNA Aye Upc Scheduling      Brittany [name]   MRN: [id number] DOB: [birth year]   Pt Work: [phone] Pt Home: [phone]   Entered: [phone]        Message    Appointment for: Brittany [name] ([id number])   Visit type: RETURN PRIMARY CARE PATIENT ([id number])   12/22/2023 1:00 PM (30 minutes) with Eva Corns, MD in [facility] INTERNAL MEDICINE      Patient comments:   Stye on left eyelid, started around July 20.   Using eye wipes daily to clean. Started daily warm compress on 7/27.     Is this a new or ongoing symptom? New  When did the symptom(s) begin? 7/20  Who is reporting the symptoms? Patient sent MyChart message on Date/ time: December 14, 2023 12:29 PM    Insurance Coverage Verified: yes  Insurance Plan Name:  [facility]  Assigned to [place]: yes  Next office visit:  12/22/2023  Did you offer Express Care/Urgent Care: (if met criteria for Same Day/3 Day scheduling but declines to schedule) no    Transferred call to: Pt self scheduled via Mychart. Agent does not have pt on the phone.        P CARE NAV TRIAGE POOL [ [id number] ]        *After hours 5P  For Faculty sites: GEN, [facility], [facility] FM, L[facility] IM,UPC IM, UPCGeriatrics , request On-Call Provider via operator at [phone]  For  [employer] Care Sites VTC, EBC, CGD, EAS, PHR and VIS request On-Call Provider via operator at [phone]

## 2023-12-18 ENCOUNTER — Other Ambulatory Visit: Attending: Student in an Organized Health Care Education/Training Program

## 2023-12-18 DIAGNOSIS — E039 Hypothyroidism, unspecified: Secondary | ICD-10-CM | POA: Insufficient documentation

## 2023-12-18 LAB — TSH, BLOOD: TSH: 0.96 u[IU]/mL (ref 0.27–4.20)

## 2023-12-18 NOTE — Interdisciplinary (Signed)
 Blood drawn from right arm with 21 gauge needle. 1 tubes taken.   Patient identity authenticated by Fran Lowes Rasch.

## 2023-12-18 NOTE — Progress Notes (Signed)
 Chief Complaint   Patient presents with    Stye     Left eyelid ongoing for 2 weeks.        SUBJECTIVE::Brittany [name] is 58 year old and here for follow-up.     Has:  #Vasomotor symptoms due to menopause; stopped estradiol  patch due to breast cancer risk  #Hyperlipidemia: on rosuvastatin   #Acquired hypothyroidism  #Prediabetes  #Family history of breast cancer  #Osteopenia: On Ca and vit D supplements.  #Gilbert syndrome    Doing PT for achilles spur.    About a couple of weeks ago had a little bit of epigastric tightness. Got better with going on vacation. Denies chest pressure or exertional chest pain.  No bloating or early satiety. No dysphagia or odynophagia.  3-4 glasses of wine a week.  Takes tylenol  arthritis twice a week. Sometimes if it hurt then ibuprofen  1 pill (twice a week).    Lesion on L upper eyelid is improving with warm compresses and Occu-Soft.    ROS  As above      Assessment and Plan:    [name] was seen today for stye.    Diagnoses and all orders for this visit:    Hordeolum externum of left upper eyelid  -     Consult/Referral to Ophthalmology    Acquired hypothyroidism  -     levothyroxine  (SYNTHROID ) 100 MCG tablet; Take 1 tablet (100 mcg) by mouth every morning (before breakfast).  -     Cancel: TSH, Blood - See Instructions; Future  -     TSH, Blood - See Instructions; Future    Calcaneal spur of right foot  -     X-Ray Foot Complete Minimum 3 Views - Right; Future    Gastroesophageal reflux disease without esophagitis  -     omeprazole  (PRILOSEC) 20 MG capsule; Take 1 capsule (20 mg) by mouth every morning (before breakfast).        Normal TSH on levothyroxine  100 mcg daily.    MRI beast 10/2023 showed:  Technically inadequate secondary to lack of signal in the left breast in the T2 sequence and in the pre and dynamic post contrast sequences.  No evidence of malignancy in the right breast.  No axillary lymphadenopathy.  No adenopathy in the right internal mammary lymph node chain. The left  internal mammary lymph node chain can not be evaluated.  Recommendations: Repeat breast MRI at no additional cost to the patient secondary to technical issues.    Start course of omeprazole  for possible GERD/dyspepsia. Monitor symptoms and return if ongoing.    Will obtain foot xray R posterior calcaneous tenderness and bone spur at the achilles insertion. Also eval bunion.     Pt to schedule with Ophtho if stye is not resolving.    Patient Instructions   Use a warm, moist compress on the affected area (for 5 to 10 minutes four times a day) to facilitate drainage, along with gentle eyelid massage.    Avoid caffeine, acidic, high-fat, spicy food; Avoid eating within 2 hours of sleep.    Please repeat TSH in 3-6 months.    Return in about 7 months (around 07/21/2024) for Annual physical.    OBJECTIVE:: BP 113/72 (BP Location: Left arm, BP Patient Position: Sitting, BP cuff size: Regular)   Pulse 82   Temp 97.9 F (36.6 C) (Temporal)   Ht 5' 9.5 (1.765 m)   Wt 98.1 kg (216 lb 3.2 oz)   LMP 08/19/2019   SpO2 98%  BMI 31.47 kg/m   Physical Exam  General Appearance: well-appearing, alert, no distress, pleasant  Eyes: small slightly crusted lesion on L upper eyelid  Ext: R posterior calcaneous tenderness and bone spur at the achilles insertion. Small R bunion.     Labs and chart reviewed.    Lab Results   Component Value Date    WBC 9.1 10/13/2022    RBC 4.23 10/13/2022    HGB 13.3 10/13/2022    HCT 38.9 10/13/2022    MCV 92.0 10/13/2022    MCHC 34.2 10/13/2022    RDW 12.0 10/13/2022    PLT 185 10/13/2022    MPV 11.1 10/13/2022       Lab Results   Component Value Date    NA 142 07/23/2023    K 3.9 07/23/2023    CL 106 07/23/2023    BICARB 26 07/23/2023    BUN 15 07/23/2023    CREAT 0.86 07/23/2023    GLU 111 (H) 07/23/2023    CA 9.2 07/23/2023       Lab Results   Component Value Date    AST 15 07/23/2023    ALT 16 07/23/2023    LDH 186 (H) 07/23/2023    ALK 76 07/23/2023    TP 6.8 07/23/2023    ALB 4.6 07/23/2023     TBILI 1.49 (H) 07/23/2023    DBILI 0.3 (H) 08/02/2022       Lab Results   Component Value Date    CHOL 171 07/23/2023    HDL 63 07/23/2023    LDLCALC 90 07/23/2023    TRIG 97 07/23/2023          Problem list was updated and reviewed today as indicated.    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    Inadequate exercise - not at goal 06/29/2021    Chronic midline low back pain without sciatica 10/17/2015    Hyperlipidemia, unspecified hyperlipidemia type 09/08/2015    Impingement syndrome, shoulder, right 05/02/2015    Radial styloid tenosynovitis 05/02/2015    Allergic rhinitis due to pollen 04/11/2014    Gastroesophageal reflux disease without esophagitis 04/11/2014    Bilateral low back pain without sciatica 09/21/2013    Tension headache 12/26/2011    Gluten intolerance 09/17/2011    Dyspepsia 07/15/2011    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    Past Medical History:   Diagnosis Date    Hypothyroidism     h/o hyperthyroidism s/p radioactive iodine    Infectious mononucleosis 08/19/2006    Insomnia     Menorrhagia        Social History:  none     Socioeconomic History    Marital status: Divorced   Occupational History    Occupation: Technical brewer   Tobacco Use    Smoking status: Former  Current packs/day: 0.00     Types: Cigarettes     Quit date: 10/19/1998     Years since quitting: 25.1     Passive exposure: Past    Smokeless tobacco: Never     Tobacco comments:     2nd hand smoke   Substance and Sexual Activity    Alcohol use: Yes     Alcohol/week: 3.0 standard drinks of alcohol     Types: 3 Glasses of wine per week    Drug use: No    Sexual activity: Not Currently     Partners: Male     Birth control/protection: Condom   Other Topics Concern    Military Service No    Blood Transfusions No    Caffeine Concern No    Occupational Exposure No    Hobby Hazards No    Sleep Concern Yes    Stress Concern Yes    Weight Concern No    Special Diet Yes     Comment: Gluten sensitivity, mother and sister are Celiac    Back Care Yes    Exercises Regularly Yes    Bike Helmet Use Yes    Seat Belt Use Yes    Performs Self-Exams No   Social History Narrative    Divorced and no kids. 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     2020: Sister died of endometrial cancer at 23 yo.     Social Drivers of Health From This Encounter     Food Insecurity: No Food Insecurity (12/17/2023)    Hunger Vital Sign     Worried About Running Out of Food in the Last Year: Never true     Ran Out of Food in the Last Year: Never true   Transportation Needs: No Transportation Needs (12/17/2023)    PRAPARE - Therapist, art (Medical): No     Lack of Transportation (Non-Medical): No   Housing Stability: Low Risk  (12/17/2023)    Housing Stability Vital Sign     Unable to Pay for Housing in the Last Year: No     Number of Times Moved in the Last Year: 0     Homeless in the Last Year: No       Allergies:  Allergies   Allergen Reactions    Vicodin [Hydrocodone-Acetaminophen ] Hallucinations        acetaminophen  Take 2 tablets (650 mg) by mouth every 4 hours as needed for Mild Pain (Pain Score 1-3).      albuterol  Inhale 2 puffs by mouth every 6 hours as needed for Wheezing. 18 g 0    azelastine  Spray 1 spray into each nostril 2 times daily. Use in each nostril as directed 90 mL 3    CALCIUM -MAGNESIUM -ZINC  PO       cetirizine  Take 1  tablet (10 mg) by mouth daily. 90 tablet 3    Cholecalciferol (VITAMIN D3 PO) Take 1,000 Int'l Units by mouth daily.      cyclobenzaprine  Take 1 tablet (5 mg) by mouth 3 times daily as needed for Muscle Spasms. 30 tablet 0    desvenlafaxine  Take 1 tablet (50 mg) by mouth daily. 90 tablet 4    desvenlafaxine  Succinate ER Take 1 tablet (25 mg) by mouth daily. 90 tablet 4    diclofenac  Apply 4 g topically 4 times daily as needed (pain). 1 each 3    fluticasone  propionate Spray 1 spray into each nostril  2 times daily. 1 bottle 5    levothyroxine  Take 1 tablet (100 mcg) by mouth every morning (before breakfast). 90 tablet 0    LORazepam  Take 1 tablet 30 to 90 minutes before procedure. May take another 0.5 tablet or 1 tablet after 30 to 60 minutes if incomplete response. 2 tablet 0    metroNIDAZOLE  Apply 1 Application topically daily. Use a small amount as directed 1 each 11    rosuvastatin  Take 1 tablet (10 mg) by mouth daily. 90 tablet 3    tretinoin  Apply a thin layer at bedtime as directed 1 Tube 3         Barriers to learning assessed: None.  Patient verbalizes understanding and is agreeable to above plan.

## 2023-12-22 ENCOUNTER — Ambulatory Visit (INDEPENDENT_AMBULATORY_CARE_PROVIDER_SITE_OTHER): Admitting: Student in an Organized Health Care Education/Training Program

## 2023-12-22 VITALS — BP 113/72 | HR 82 | Temp 97.9°F | Ht 69.5 in | Wt 216.2 lb

## 2023-12-22 DIAGNOSIS — H00014 Hordeolum externum left upper eyelid: Secondary | ICD-10-CM

## 2023-12-22 DIAGNOSIS — K219 Gastro-esophageal reflux disease without esophagitis: Secondary | ICD-10-CM

## 2023-12-22 DIAGNOSIS — M7731 Calcaneal spur, right foot: Secondary | ICD-10-CM

## 2023-12-22 DIAGNOSIS — E039 Hypothyroidism, unspecified: Secondary | ICD-10-CM

## 2023-12-22 MED ORDER — OMEPRAZOLE 20 MG OR CPDR
20.0000 mg | DELAYED_RELEASE_CAPSULE | Freq: Every day | ORAL | 0 refills | Status: DC
Start: 2023-12-22 — End: 2024-02-19

## 2023-12-22 MED ORDER — LEVOTHYROXINE SODIUM 100 MCG OR TABS
100.0000 ug | ORAL_TABLET | Freq: Every day | ORAL | 3 refills | Status: AC
Start: 2023-12-22 — End: ?

## 2023-12-22 NOTE — Patient Instructions (Addendum)
 Use a warm, moist compress on the affected area (for 5 to 10 minutes four times a day) to facilitate drainage, along with gentle eyelid massage.    Avoid caffeine, acidic, high-fat, spicy food; Avoid eating within 2 hours of sleep.    Please repeat TSH in 3-6 months.

## 2023-12-27 ENCOUNTER — Encounter (INDEPENDENT_AMBULATORY_CARE_PROVIDER_SITE_OTHER): Payer: Self-pay | Admitting: Student in an Organized Health Care Education/Training Program

## 2023-12-29 NOTE — Telephone Encounter (Signed)
 Noted

## 2024-01-25 ENCOUNTER — Encounter (INDEPENDENT_AMBULATORY_CARE_PROVIDER_SITE_OTHER): Payer: Self-pay | Admitting: Hospital

## 2024-02-11 ENCOUNTER — Ambulatory Visit: Payer: Self-pay

## 2024-02-11 DIAGNOSIS — Z23 Encounter for immunization: Secondary | ICD-10-CM

## 2024-02-19 ENCOUNTER — Emergency Department: Admission: EM | Admit: 2024-02-19 | Discharge: 2024-02-19 | Disposition: A | Discharge status: Home / Self Care

## 2024-02-19 ENCOUNTER — Ambulatory Visit (INDEPENDENT_AMBULATORY_CARE_PROVIDER_SITE_OTHER): Payer: Self-pay | Admitting: Student in an Organized Health Care Education/Training Program

## 2024-02-19 DIAGNOSIS — K921 Melena: Secondary | ICD-10-CM | POA: Insufficient documentation

## 2024-02-19 DIAGNOSIS — K219 Gastro-esophageal reflux disease without esophagitis: Secondary | ICD-10-CM | POA: Insufficient documentation

## 2024-02-19 DIAGNOSIS — R000 Tachycardia, unspecified: Secondary | ICD-10-CM | POA: Insufficient documentation

## 2024-02-19 DIAGNOSIS — Z8719 Personal history of other diseases of the digestive system: Secondary | ICD-10-CM

## 2024-02-19 DIAGNOSIS — R1013 Epigastric pain: Secondary | ICD-10-CM | POA: Insufficient documentation

## 2024-02-19 DIAGNOSIS — K625 Hemorrhage of anus and rectum: Secondary | ICD-10-CM

## 2024-02-19 LAB — COMPREHENSIVE METABOLIC PANEL, BLOOD
ALT (SGPT): 31 U/L (ref 0–35)
AST (SGOT): 29 U/L (ref 0–35)
Albumin: 4.6 g/dL (ref 3.5–5.2)
Alkaline Phos: 85 U/L (ref 40–130)
Anion Gap: 11 mmol/L (ref 7–15)
BUN: 11 mg/dL (ref 6–20)
Bicarbonate: 25 mmol/L (ref 22–29)
Bilirubin, Tot: 1.2 mg/dL — ABNORMAL HIGH (ref <1.2)
Calcium: 9.7 mg/dL (ref 8.5–10.6)
Chloride: 106 mmol/L (ref 98–107)
Creatinine: 0.75 mg/dL (ref 0.51–0.95)
Glucose: 140 mg/dL — ABNORMAL HIGH (ref 70–99)
Potassium: 4 mmol/L (ref 3.5–5.1)
Sodium: 142 mmol/L (ref 136–145)
Total Protein: 7.1 g/dL (ref 6.0–8.0)
eGFR Based on CKD-EPI 2021 Equation: >60 mL/min/1.73 m2

## 2024-02-19 LAB — CBC WITH DIFF, BLOOD
ANC-Automated: 3.1 1000/mm3 (ref 1.6–7.0)
Abs Basophils: 0 1000/mm3 (ref <0.2)
Abs Eosinophils: 0.1 1000/mm3 (ref 0.0–0.5)
Abs Lymphs: 3.5 1000/mm3 — ABNORMAL HIGH (ref 0.8–3.1)
Abs Monos: 0.4 1000/mm3 (ref 0.2–0.8)
Basophils: 0.6 %
Eosinophils: 1.3 %
Hct: 40.8 % (ref 34.0–45.0)
Hgb: 13.7 g/dL (ref 11.2–15.7)
Imm Gran %: 0.3 % (ref <1)
Imm Gran Abs: 0 1000/mm3 (ref <0.1)
Lymphocytes: 48.7 %
MCH: 31.1 pg (ref 26.0–32.0)
MCHC: 33.6 g/dL (ref 32.0–36.0)
MCV: 92.7 um3 (ref 79.0–95.0)
MPV: 11.5 fL (ref 9.4–12.4)
Monocytes: 5.2 %
Plt Count: 197 1000/mm3 (ref 140–370)
RBC: 4.4 mill/mm3 (ref 3.90–5.20)
RDW: 11.7 % — ABNORMAL LOW (ref 12.0–14.0)
Segs: 43.9 %
WBC: 7.1 1000/mm3 (ref 4.0–10.0)

## 2024-02-19 LAB — URINALYSIS WITH CULTURE REFLEX, WHEN INDICATED
Bilirubin: NEGATIVE
Blood: NEGATIVE
Glucose: NEGATIVE
Ketones: NEGATIVE
Leuk Esterase: NEGATIVE Leu/uL
Nitrite: NEGATIVE
Protein: NEGATIVE
Specific Gravity: 1.012 (ref 1.002–1.030)
Urobilinogen: NEGATIVE
pH: 7.5 (ref 5.0–8.0)

## 2024-02-19 MED ORDER — PANTOPRAZOLE SODIUM 40 MG IV SOLR
40.0000 mg | Freq: Two times a day (BID) | INTRAVENOUS | Status: DC
Start: 2024-02-20 — End: 2024-02-19

## 2024-02-19 MED ORDER — PANTOPRAZOLE SODIUM 40 MG OR TBEC
40.0000 mg | DELAYED_RELEASE_TABLET | Freq: Every day | ORAL | 0 refills | Status: DC
Start: 2024-02-19 — End: 2024-02-19
  Filled 2024-02-19: qty 30, 30d supply, fill #0

## 2024-02-19 MED ORDER — PANTOPRAZOLE SODIUM 40 MG OR TBEC
40.0000 mg | DELAYED_RELEASE_TABLET | Freq: Every day | ORAL | 0 refills | Status: DC
Start: 2024-02-19 — End: 2024-03-19

## 2024-02-19 MED ORDER — SODIUM CHLORIDE 0.9 % IJ SOLN FLUSH
80.0000 mg | Freq: Once | INTRAVENOUS | Status: DC
Start: 2024-02-19 — End: 2024-02-19
  Filled 2024-02-19: qty 80

## 2024-02-19 NOTE — Telephone Encounter (Signed)
 Chief Complaint  Pt is reporting black stools today. She had 3 black stools today. Had some abd pain, today it is more mild. Stayed home from work today. Two days she was constipated. She had diarrhea 2 weeks ago. Some dizziness but states she has cold sx.   Took Pepcid AC a few days ago. Has not taken pepto bismol or iron.   Denies, vomiting, fever  No thinners.    Intervention: Patient advised to be evaluated in ED, and have another person drive them to hospital.  RN placed ED referral in Epic.  and pt given strict UC/ED precautions and reviewed all applicable Home Care Advice per protocol.     Reason for Call: Rectal Bleeding       Disposition: Go to ED Now        Reason for Disposition   Black or tarry bowel movements  (Exception: Chronic-unchanged black-grey BMs AND is taking iron pills or Pepto-Bismol.)    Answer Assessment - Initial Assessment Questions  1. APPEARANCE of BLOOD: What color is it? Is it passed separately, on the surface of the stool, or mixed in with the stool?       Pt is reporting black stools today. She had 3 black stools today. Had some abd pain, today it is more mild. Stayed home from work today. Two days she was constipated. She had diarrhea 2 weeks ago. Some dizziness but states she has cold sx.   Took Pepcid AC a few days ago. Has not taken pepto bismol or iron.   Denies, vomiting, fever  No thinners.    Protocols used: Rectal Bleeding-A-AH

## 2024-02-19 NOTE — Telephone Encounter (Signed)
 Routing to [facility] for triage.

## 2024-02-19 NOTE — Telephone Encounter (Signed)
 Symptom Call         What symptom is the patient experiencing? Hello Dr. Gala,  I'm requesting a prescription for the Cologuard Plus colon cancer at-home screening test:  https://www.cologuard.com/what-is-cologuard-plus     My stool is very dark today (I haven't taken a Pepcid AC in 2 days but did take one for two days in a row recently). Also had a stomach ache yesterday.       If it's possible to get an Rx for the test, please let me know. Thank you. Brittany [name]  Is this a new or ongoing symptom? new  When did the symptom(s) begin? 2 days  Who is reporting the symptoms? Incoming call from patient    Insurance Coverage Verified: yes  Insurance Plan Name:  [facility]  Assigned to [place]: yes  Next office visit:  Visit date not found  Did you offer Express Care/Urgent Care: no    Transferred call to: Cold Symptoms Line (ext [id number])    Best way to contact patient: Phone: [phone]          Encounter created by Hadassah Kevin Sauers from the Care Assist Team.  Signature generated from controlled access password on February 19, 2024 at 4:00 PM.  If further action required please route encounter to appropriate in-clinic POOL.

## 2024-02-19 NOTE — ED Provider Notes (Signed)
 ED Provider Note  [facility] electronic medical record reviewed for pertinent medical history.     Keene Sherrilyn Cary DOB: 1966-01-13 PMD: Gala Eva Olszewski     ED Arrival Information       Expected   02/19/2024 00:00    Arrival   02/19/2024 16:48    Acuity   Emergent/ESI Level 2              Means of arrival   Automobile    Escorted by   -    Service   ED Medicine    Admission type   Emergency              Arrival complaint   three episodes of black stools today w/ abd pain and mild dizziness.             Chief Complaint   Patient presents with   . Rectal Bleeding     three episodes of black stools today w/ abd pain and mild dizziness. Denies thinners/recent abdl surg/procedure. No trauma       HPI: Nigel Wessman is a 58 year old female who has a past medical history of Hypothyroidism, Infectious mononucleosis (08/19/2006), Insomnia, and Menorrhagia.     70F with HLD, hypothyroidism, preDM, family hx breast ca, gilbert's, gluten intolerance presenting with 3 melenic stools. First stool happened this AM, and most recently occurred prior to ED presentation where she states her wipe was streaked with brown. She denies stool being tarry, just black. She denies any fevers, N/V, abdominal pain but does feel more tired than usual. Denies shortness of breath. Has not had this happen before. States she drinks about 1 drink a day and does not have known liver diagnoses, but has diagnosis for gilbert's. Last colonoscopy in           External Data Sources (Select all that apply):    Colonoscopy 04/24/19  04/24/2019  FINDINGS The mucosa in the terminal ileum appeared normal, without erosion or ulcer. The mucosa throughout the colon appeared shiny, with normal vascular pattern and folds. No polyps encountered on today's exam. Diverticula present throughout the colon, most dense in the left colon. Retroflexion in the rectum revealed small internal hemorrhoids. ENDOSCOPIC DIAGNOSIS 1. No polyps present on today's exam. 2. Pan-colonic  diverticulosis, most dense in the left colon. 3. Small internal hemorrhoids. RECOMMENDATIONS 1. Fiber rich diet, with goal 25-30 grams fiber intake per day. 2. Avoid prolonged sitting or straining with bowel movements. 3. Your next colonoscopy can take place in ten years. Hx laparascopic cholecystectomy. No hemoptysis, hematuria, or bleeding anywhere else noted. Has reflux at baseline but not on PPI. Used ibuprofen  twice in the past week. No new meds or supplements, denies iron supplements.    07/31/2011 EGD for dyspepsia  Findings: Normal esophagus and GE junction.  Normal stomach. Biopsies taken to evaluate for H pylori.  Normal duodenum. Biopsies taken to evaluate for celiac  disease.     Endoscopic Diagnosis: Normal esophagus and GE junction.  Normal stomach. Biopsies taken to evaluate for H pylori.  Normal duodenum. Biopsies taken to evaluate for celiac  disease.     Recommendations: There are no obvious findings on  today's exam to explain your symptoms.  Await pathology results.  Follow up in clinic with Dr. Tobie.    Pathology results:   FINAL PATHOLOGIC DIAGNOSIS:  A: Duodenum, biopsy       -Duodenal mucosa with no significant histopathology.  B: Stomach, biopsy       -  Mild chronic inactive gastritis.       -No evidence of Helicobacter organisms.    Pertinent Medical History:    PMHx: As above    Past Surgical History[1]    Family History[2]    Current Outpatient Medications   Medication Instructions   . acetaminophen  (TYLENOL ) 650 mg, EVERY 4 HOURS PRN   . albuterol  (PROVENTIL  HFA) 108 (90 Base) MCG/ACT inhaler 2 puffs, Inhalation, EVERY 6 HOURS PRN   . azelastine  (ASTELIN ) 0.1 % nasal spray 1 spray, Each Naris, 2 TIMES DAILY, Use in each nostril as directed   . CALCIUM -MAGNESIUM -ZINC  PO No dose, route, or frequency recorded.   . cetirizine  (ZYRTEC ) 10 mg, Oral, DAILY   . Cholecalciferol (VITAMIN D3 PO) 1,000 Int'l Units, DAILY   . cyclobenzaprine  (FLEXERIL ) 5 mg, Oral, 3 TIMES DAILY PRN   . desvenlafaxine   (PRISTIQ ) 50 mg, Oral, DAILY   . desvenlafaxine  Succinate ER (PRISTIQ ) 25 mg, Oral, DAILY   . diclofenac  (VOLTAREN ) 4 g, Topical, 4 TIMES DAILY PRN   . fluticasone  propionate (FLONASE ) 50 MCG/ACT nasal spray 1 spray, Each Naris, 2 TIMES DAILY   . levothyroxine  (SYNTHROID ) 100 mcg, Oral, DAILY BEFORE BREAKFAST   . LORazepam  (ATIVAN ) 1 MG tablet Take 1 tablet 30 to 90 minutes before procedure. May take another 0.5 tablet or 1 tablet after 30 to 60 minutes if incomplete response.   . metroNIDAZOLE  (METROGEL ) 1 % gel 1 Application., Topical, DAILY, Use a small amount as directed   . omeprazole  (PRILOSEC) 20 mg, Oral, DAILY BEFORE BREAKFAST   . rosuvastatin  (CRESTOR ) 10 mg, Oral, DAILY   . tretinoin  (RETIN-A ) 0.025 % cream Apply a thin layer at bedtime as directed       Physical Exam  BP 125/66   Pulse 84   Temp 98.6 F (37 C)   Resp 15   Ht 5' 9 (1.753 m)   Wt 98.1 kg (216 lb 4.3 oz)   LMP 08/19/2019   SpO2 97%   BMI 31.94 kg/m   Physical Exam  Constitutional:       Appearance: Normal appearance. She is normal weight.   HENT:      Head: Atraumatic.      Mouth/Throat:      Mouth: Mucous membranes are moist.   Eyes:      Conjunctiva/sclera: Conjunctivae normal.   Cardiovascular:      Rate and Rhythm: Regular rhythm. Tachycardia present.   Pulmonary:      Effort: Pulmonary effort is normal.      Breath sounds: Normal breath sounds.   Abdominal:      General: Abdomen is flat. Bowel sounds are normal. There is no distension.      Palpations: Abdomen is soft.      Tenderness: There is no abdominal tenderness.   Skin:     General: Skin is warm and dry.   Neurological:      Mental Status: She is alert.       Orders/Medications    Orders Placed This Encounter   Procedures   . CBC w/ Diff Lavender   . Comprehensive Metabolic Panel   . HIV 1/2 Antibody & P24 Antigen Assay   . Syphilis Screen, Blood Yellow serum separator tube   . Urinalysis with Culture Reflex, when indicated   . ABO/RH   . Gastroenterology Clinic   .  HGB (POCT) Blood       Medications - No data to display  Medical Decision Making/Assessment/Plan    This is a(n) 58 year old female who has a past medical history of Hypothyroidism, Infectious mononucleosis (08/19/2006), Insomnia, and Menorrhagia. and presents with reported melena.    16F with HLD, hypothyroidism, preDM, family hx breast ca, gilbert's, gluten intolerance presenting with 3 melenic stools. However, labs here with robust Hgb (13.7) and FOBT negative for blood. Stool appeared brown on smear. Tachycardia seems to be related to anxiety per report, on recheck HR to 80s. Will encourage PO hydration and refer to GI outpatient for further evaluation, and start on PPI (already previously prescribed). Notably prior EGD in 2013 was negative for H pylori on biopsy.     ED Course/Updates/Disposition  ED Course as of 02/19/24 1909   Ileana Maus Ding-Chieh's Documentation   Thu Feb 19, 2024   1818 CBC w/ Diff Lavender(!)  No anemia   1818 Comprehensive Metabolic Panel(!)  Has diagnosis of gilbert; not jaundiced   1818 FOBT negative, see media tab   1909 RBC(!): 3-5  Recommend outpatient follow up with PCP     Patient referred to GI for outpatient follow up. Counseled to start her prescribed omeprazole  daily.            Ileana Maus Snider, MD  Resident  02/19/24 1906       [1]  Past Surgical History:  Procedure Laterality Date   . left wrist tendon repair  05/20/1996   . CHOLECYSTECTOMY, LAP  05/20/1993   . PB REMOVE TONSILS/ADENOIDS,<12 Y/O  05/20/1973   . CHOLECYSTECTOMY  1997   . PB MYOMECTOMY 1-4 MYOMAS 250 GM/< VAGINAL APPR     . wisdom teeth     [2]  Family History  Problem Relation Name Age of Onset   . Breast Cancer Mother Faydra Korman - deceased 71   . Cancer Mother Elynore Dolinski - deceased         Stage 4 breast cancer   . Hypertension Father Mr. JOSHLYNN ALFONZO - deceased    . Other Sister Evita Merida - deceased         Uterine fibroids/hysterectomy   . Crohn's Disease Sister Oakley Orban -  deceased    . Cancer Sister Virgin Zellers - deceased         Endometrial cancer   . Breast Cancer Sister Elveria Fridge 40        DCIS   . Hypertension Sister Elveria Fridge         Hypertension / obese   . Endometrial Cancer Sister Priyanka Causey    . Hypertension Sister     . Cholesterol/Lipid Disorder Sister     . Heart Disease Sister Darci Lykins         Stent   . Diabetes Sister Shawndrea Rutkowski         Adult onset   . Hypertension Sister Deaun Rocha    . Thyroid  Sister Sharmila Wrobleski    . Breast Cancer P Aunt     . Ovarian Cancer Neg Hx          but sister w/ovarian cysts age 64        Ghobrial, Mina Kyrollus, MD  02/21/24 [phone]

## 2024-02-19 NOTE — ED Notes (Signed)
 MD Ileana at the bedside.

## 2024-02-19 NOTE — ED Notes (Signed)
 Bed: P2  Expected date: 02/19/24  Expected time: 12:00 AM  Means of arrival: Automobile  Comments:

## 2024-02-19 NOTE — Discharge Instructions (Addendum)
 You have received a screening exam in the emergency department for any dangerous conditions.  At this time, your evaluation has not resulted in any identification of emergency conditions requiring hospitalization or further treatment at this time.  Anytime somebody visits the emergency department, it is important to obtain close follow-up with your primary care doctor for further evaluation of your symptoms and check up to ensure resolution.  You may return to the emergency room at any time for any new, recurrent, concerning, or worsening symptoms of any kind.  These can include but not be limited to chest pain, shortness of breath, intractable fevers and chills, nausea and vomiting.    We recommend following up with your primary care doctor in the next 1-2 for reassessment. Please discuss repeating a urinalysis to evaluate any blood in urine.    Please start taking your prescribed pantoprazole daily. If your symptoms do not improve you can increase to twice daily dosing.     Please follow up with gastroenterology clinic.

## 2024-02-20 ENCOUNTER — Other Ambulatory Visit (INDEPENDENT_AMBULATORY_CARE_PROVIDER_SITE_OTHER): Payer: Self-pay | Admitting: Student in an Organized Health Care Education/Training Program

## 2024-02-20 ENCOUNTER — Other Ambulatory Visit: Payer: Self-pay

## 2024-02-20 DIAGNOSIS — J301 Allergic rhinitis due to pollen: Secondary | ICD-10-CM

## 2024-02-20 DIAGNOSIS — E039 Hypothyroidism, unspecified: Secondary | ICD-10-CM

## 2024-02-20 LAB — HIV 1/2 ANTIBODY & P24 ANTIGEN ASSAY, BLOOD: HIV 1/2 Antibody & P24 Antigen Assay: NONREACTIVE

## 2024-02-20 LAB — SYPHILIS EIA SCREEN, BLOOD: Syphilis Screen: NEGATIVE

## 2024-02-23 MED ORDER — CETIRIZINE HCL 10 MG OR TABS
10.0000 mg | ORAL_TABLET | Freq: Every day | ORAL | 3 refills | Status: AC
Start: 2024-02-23 — End: ?

## 2024-02-23 NOTE — Telephone Encounter (Signed)
 Brittany [name], CPhT  (Rx Refill [facility])      Antihistamine Refill Protocol    Last visit in enc specialty: 12/22/2023     Recent Visits in This Encounter Department       Date Provider Department Visit Type Primary Dx    12/22/2023 Gala Eva Olszewski, MD [facility] Internal Medicine Office Visit Hordeolum externum of left upper eyelid    08/01/2023 Hakim, Eva Olszewski, MD [facility] Internal Medicine Office Visit Encounter for routine adult medical examination    05/02/2023 Hakim, Eva Olszewski, MD [facility] Internal Medicine Office Visit Vasomotor symptoms due to menopause    12/11/2022 Hakim, Eva Olszewski, MD [facility] - [facility] Internal Medicine Office Visit Family history of osteoporosis    10/21/2022 Hakim, Eva Olszewski, MD [facility] - [facility] Internal Medicine Office Visit History of epigastric pain           Recommended Follow up From Recent Visits       Date Provider Department Visit Type Follow Up    12/22/2023 Gala Eva Olszewski, MD [facility] Internal Medicine Office Visit Return in about 7 months (around 07/21/2024) for Annual physical.      08/01/2023 Hakim, Eva Olszewski, MD [facility] Internal Medicine Office Visit Return in about 1 year (around 07/31/2024).      05/02/2023 Hakim, Eva Olszewski, MD [facility] Internal Medicine Office Visit Return in about 3 months (around 07/31/2023) for Annual physical with labs prior.      12/11/2022 Hakim, Eva Olszewski, MD [facility] - [facility] Internal Medicine Office Visit No Follow-up on file.    10/21/2022 Hakim, Eva Olszewski, MD [facility] - [facility] Internal Medicine Office Visit No Follow-up on file.           Population Health Visits  Recent PHSO Visits    None       Next appt in enc specialty: Visit date not found      Future Appointments 02/23/2024 - 02/21/2029        Date Visit Type Department  Provider     03/11/2024  9:40 AM NEW EVALUATION VIDEO [place] [facility] GASTRO Chip Brittany Helling, [place]    Appointment Notes:     Follow up from 10/2 ED visit. Possible ulcer?             04/27/2024  7:00 AM RETURN OPTOMETRY [place] [facility] OPTOMETRY Vo, Prentice Proper, OD    Appointment Notes:     Auth# [id number] General eye exam *vsp*             04/29/2024  8:00 AM HUMPHREY VISUAL FIELD [place] [facility] OPHTHALMOLOGY VISUAL FIELD    Appointment Notes:     Return in about 6 months (around 04/30/2024) for HVF 24-2, OCT, IOP Check, DFE.             04/29/2024  8:30 AM RETURN OPHTHALMOLOGY [place] [facility] OPHTHALMOLOGY Babara Niels GRADE, [place]    Appointment Notes:     Return in about 6 months (around 04/30/2024) for HVF 24-2, OCT, IOP Check, DFE.             05/07/2024  9:00 AM RETURN GEN DERM [place] [facility] Dermatology [facility] Vergie Glatter, MD    Appointment Notes:  TBC             05/07/2024 10:00 AM SCREENING MAMMO W/ TOMO BILAT [place] [facility] MAMMOGRAP[facility] MOP MAMMO ROOM 2    Appointment Notes:     07/31/23 sched w/ pt, conf loc/check in, gave prep -ss             05/07/2024 10:45 AM RETURN BREAST SURG [place] [facility] [place] Jerrell Ned, NP    Appointment Notes:     9 mo + mammo same dayLVM confirming new apt             07/19/2024  2:00 PM RETURN WELL WOMAN EXAM [place] [facility] Deak, Sharlet Daub, MD    Appointment Notes:     WWE, last wwe:07/01/2023                        LABS required:  (None)    Monitoring required:  (None)

## 2024-02-28 ENCOUNTER — Ambulatory Visit (INDEPENDENT_AMBULATORY_CARE_PROVIDER_SITE_OTHER): Payer: Self-pay

## 2024-02-28 ENCOUNTER — Ambulatory Visit (INDEPENDENT_AMBULATORY_CARE_PROVIDER_SITE_OTHER)

## 2024-02-28 ENCOUNTER — Other Ambulatory Visit: Payer: Self-pay

## 2024-02-28 VITALS — BP 121/54 | HR 88 | Temp 96.3°F | Resp 18

## 2024-02-28 DIAGNOSIS — R059 Cough, unspecified: Secondary | ICD-10-CM

## 2024-02-28 DIAGNOSIS — R5383 Other fatigue: Secondary | ICD-10-CM | POA: Insufficient documentation

## 2024-02-28 DIAGNOSIS — R1013 Epigastric pain: Secondary | ICD-10-CM | POA: Insufficient documentation

## 2024-02-28 LAB — CBC WITH DIFF, BLOOD
ANC-Automated: 2.2 1000/mm3 (ref 1.6–7.0)
Abs Basophils: 0 1000/mm3 (ref <0.2)
Abs Eosinophils: 0.1 1000/mm3 (ref 0.0–0.5)
Abs Lymphs: 3.5 1000/mm3 — ABNORMAL HIGH (ref 0.8–3.1)
Abs Monos: 0.3 1000/mm3 (ref 0.2–0.8)
Basophils: 0.6 %
Eosinophils: 1 %
Hct: 42.2 % (ref 34.0–45.0)
Hgb: 14 g/dL (ref 11.2–15.7)
Imm Gran %: 0.2 % (ref <1)
Imm Gran Abs: 0 1000/mm3 (ref <0.1)
Lymphocytes: 56.9 %
MCH: 31.1 pg (ref 26.0–32.0)
MCHC: 33.2 g/dL (ref 32.0–36.0)
MCV: 93.8 um3 (ref 79.0–95.0)
MPV: 12 fL (ref 9.4–12.4)
Monocytes: 5.5 %
Plt Count: 194 1000/mm3 (ref 140–370)
RBC: 4.5 mill/mm3 (ref 3.90–5.20)
RDW: 11.9 % — ABNORMAL LOW (ref 12.0–14.0)
Segs: 35.8 %
WBC: 6.2 1000/mm3 (ref 4.0–10.0)

## 2024-02-28 LAB — COMPREHENSIVE METABOLIC PANEL, BLOOD
ALT (SGPT): 27 U/L (ref 0–35)
AST (SGOT): 25 U/L (ref 0–35)
Albumin: 4.6 g/dL (ref 3.5–5.2)
Alkaline Phos: 77 U/L (ref 40–130)
Anion Gap: 13 mmol/L (ref 7–15)
BUN: 12 mg/dL (ref 6–20)
Bicarbonate: 23 mmol/L (ref 22–29)
Bilirubin, Tot: 1.63 mg/dL — ABNORMAL HIGH (ref <1.2)
Calcium: 10 mg/dL (ref 8.5–10.6)
Chloride: 102 mmol/L (ref 98–107)
Creatinine: 0.8 mg/dL (ref 0.51–0.95)
Glucose: 85 mg/dL (ref 70–99)
Potassium: 4.5 mmol/L (ref 3.5–5.1)
Sodium: 138 mmol/L (ref 136–145)
Total Protein: 7.1 g/dL (ref 6.0–8.0)
eGFR Based on CKD-EPI 2021 Equation: >60 mL/min/1.73 m2

## 2024-02-28 LAB — RAPID INFLUENZA A&B NAAT (POCT)
Influenza A, Rapid: NOT DETECTED
Influenza B, Rapid: NOT DETECTED

## 2024-02-28 LAB — COVID-19 RAPID NAAT (POCT): COVID-19 Rapid Assay (POCT): NOT DETECTED

## 2024-02-28 LAB — TSH, BLOOD: TSH: 0.95 u[IU]/mL (ref 0.27–4.20)

## 2024-02-28 LAB — LIPASE, BLOOD: Lipase: 30 U/L (ref 13–60)

## 2024-02-28 MED ORDER — SUCRALFATE 1 GM OR TABS: 1.0000 g | ORAL_TABLET | Freq: Three times a day (TID) | ORAL | 0 refills | Status: DC

## 2024-02-28 NOTE — Progress Notes (Addendum)
 Chief Complaint:   Chief Complaint   Patient presents with    Flu Like Symptoms     Stomach ache for over one weekWent to ED 10/2, all tests normal.GI Telehealth visit not until 10/23Home Covid/flu test on 10/8 negative.Would like a covid test & flu test & eval - Entered by patient       Case summary is as follows: [name] is a 58 year old female  who presents with one week history of epigastric pain. Additionally she also had URI symptoms and has persistent dry cough. Last week she had episodes of melenic stool and was evaluated in the ED. CBC was wnl, CMP with mildly elevated billirubin but patient has history of Gilberts. Occult stool sample was negative for blood. She was diagnosed with an ulcer and advised to take pantoprazole. She feels she did not tolerate this well and states that it interacts with her thyroid  medication. She is concerned because she continues to have epigastric discomfort and fatigue. The discomfort feels best when she is laying down. She had a cholecystectomy several years ago.     Past Medical History[1]  Past Surgical History[2]  Social History     Socioeconomic History    Marital status: Divorced     Spouse name: Not on file    Number of children: Not on file    Years of education: Not on file    Highest education level: Not on file   Occupational History    Occupation: Technical brewer   Tobacco Use    Smoking status: Former     Current packs/day: 0.00     Types: Cigarettes     Quit date: 10/19/1998     Years since quitting: 25.3     Passive exposure: Past    Smokeless tobacco: Never    Tobacco comments:     2nd hand smoke   Substance and Sexual Activity    Alcohol use: Yes     Alcohol/week: 3.0 standard drinks of alcohol     Types: 3 Glasses of wine per week    Drug use: No    Sexual activity: Not Currently     Partners: Male     Birth control/protection: Condom   Other Topics Concern    Military Service No    Blood Transfusions No    Caffeine Concern No    Occupational  Exposure No    Hobby Hazards No    Sleep Concern Yes    Stress Concern Yes    Weight Concern No    Special Diet Yes     Comment: Gluten sensitivity, mother and sister are Celiac    Back Care Yes    Exercises Regularly Yes    Bike Helmet Use Yes    Seat Belt Use Yes    Performs Self-Exams No   Social History Narrative    Divorced and no kids. [place] 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     2020: Sister died of endometrial cancer at 56 yo.     Social Drivers of Psychologist, prison and probation services Strain: Not on file   Food Insecurity: No Food Insecurity (12/17/2023)    Hunger Vital Sign     Worried About Running Out of Food in the Last Year: Never true     Ran Out of Food in the Last Year: Never true   Transportation Needs: No Transportation Needs (12/17/2023)    PRAPARE - Transportation  Lack of Transportation (Medical): No     Lack of Transportation (Non-Medical): No   Physical Activity: Insufficiently Active (10/05/2020)    Exercise is Medicine (EIM)     Days of Exercise per Week: Not on file     Minutes of Exercise per Session: Not on file   Stress: Not on file   Social Connections: Not on file   Intimate Partner Violence: Low Risk  (10/21/2022)    UC IPV     Have you ever been emotionally or physically abused by your partner or someone important to you?: No     Within the last year have you been hit, slapped, kicked or otherwise physically hurt by someone?: No     Since you've been pregnant, have you been slapped, kicked or otherwise physically hurt by someone?: Not on file     Within the last year has anyone forced you to have sexual activities?: No     Are you afraid of your spouse or partner you listed above?: No   Housing Stability: Low Risk  (12/17/2023)    Housing Stability Vital Sign     Unable to Pay for Housing in the Last Year: No     Number of Times Moved in the Last Year: 0     Homeless in the Last Year: No         Review of Systems     Review of Systems    Constitutional:  Positive for fatigue. Negative for fever.   Respiratory:  Positive for cough. Negative for shortness of breath.    Gastrointestinal:  Positive for abdominal pain. Negative for constipation, diarrhea, nausea and vomiting.   Genitourinary:  Negative for dysuria, flank pain, frequency, urgency and vaginal discharge.   Skin:  Negative for rash.         Physical Examination:    02/28/24  1107   BP: (!) 121/54   Pulse: 88   Temp: 96.3 F (35.7 C)   Resp: 18   SpO2: 98%       Physical Exam  Constitutional:       Appearance: Normal appearance.   HENT:      Head: Normocephalic and atraumatic.   Eyes:      Conjunctiva/sclera: Conjunctivae normal.      Pupils: Pupils are equal, round, and reactive to light.   Cardiovascular:      Rate and Rhythm: Normal rate and regular rhythm.   Pulmonary:      Effort: Pulmonary effort is normal.      Breath sounds: Normal breath sounds.   Abdominal:      Tenderness: There is abdominal tenderness in the right upper quadrant, epigastric area and left upper quadrant.   Neurological:      Mental Status: She is alert.         Impression:       ICD-10-CM ICD-9-CM    1. Epigastric pain  R10.13 789.06 Helicobacter pylori Antigen, Stool      CBC w/ Diff Lavender      Comprehensive Metabolic Panel (CMP)      Lipase, Blood Green Plasma Separator Tube      sucralfate (CARAFATE) 1 GM tablet      2. Cough, unspecified type  R05.9 786.2       3. Fatigue, unspecified type  R53.83 780.79 TSH, Blood - See Instructions              Given her presentation, the differential includes peptic ulcer disease (with  possible ongoing or recurrent ulceration), gastritis, GERD, and post-cholecystectomy syndrome. Less likely but still considered are pancreatic or hepatobiliary pathology, especially given her mild hyperbilirubinemia, as well as functional dyspepsia. Her persistent cough and recent URI raise the possibility of reflux-induced esophagitis or a pulmonary process contributing to her symptoms.  Cardiac etiology is less likely.  . The plan includes considering alternative acid suppression therapy, further workup for H. pylori  and follow up with GI as scheduled.     Addendum:  I spoke with the patient further after her visit to discuss the lab results. It was explained to her that while her symptoms could be explained by dyspepsia, cardiac etiology should also be considered. She reported to me that she has been a little more winded with exertion (which she attributed to her concurrent URI) and may have slight worsening in the epigastric pain with exertion. She had similar episode a few years ago and had a full cardiac work up which was unremarkable. I advised the patient that though she had similar symptoms previously that had a normal workup, I still recommend she go to the ED for further evaluation to rule out potential cardiac etiology. She states that she does not want to go at this time but will go if any symptoms worsen.     Duwaine Hurst, PA-C    Supervising physician Dr. Kristie Herter               [1]   Past Medical History:  Diagnosis Date    Hypothyroidism     h/o hyperthyroidism s/p radioactive iodine    Infectious mononucleosis 08/19/2006    Insomnia     Menorrhagia    [2]   Past Surgical History:  Procedure Laterality Date    left wrist tendon repair  05/20/1996    CHOLECYSTECTOMY, LAP  05/20/1993    PB REMOVE TONSILS/ADENOIDS,<12 Y/O  05/20/1973    CHOLECYSTECTOMY  1997    PB MYOMECTOMY 1-4 MYOMAS 250 GM/< VAGINAL APPR      wisdom teeth

## 2024-02-28 NOTE — Interdisciplinary (Signed)
Blood drawn from right arm with 21 gauge needle. 2 tubes taken.   Patient identity authenticated by Frederika Hukill Ysabella Havoc Sanluis, LVN.

## 2024-03-08 ENCOUNTER — Encounter (INDEPENDENT_AMBULATORY_CARE_PROVIDER_SITE_OTHER): Payer: Self-pay | Admitting: Hospital

## 2024-03-11 ENCOUNTER — Ambulatory Visit (INDEPENDENT_AMBULATORY_CARE_PROVIDER_SITE_OTHER): Admitting: Physician Assistant

## 2024-03-14 NOTE — Progress Notes (Deleted)
 No chief complaint on file.      Patient's most important issues for today's visit:        12/17/2023     9:03 AM 07/29/2023     7:25 PM 05/01/2023     7:02 PM 12/10/2022     8:24 AM 10/20/2022     2:56 PM 08/01/2022     4:13 PM 06/28/2021    10:06 PM   PATIENT'S MOST IMPORTANT ISSUES   Patient's Most Important Issues For Your Visit Thyroid , stye, GERD Bloodwork results; thyroid  med Estrogen patch; weight gain; general health Bone density scan; fatigue Recent visit to ED; review labs Overall health; weight gain Frequent colds; fatigue         SUBJECTIVE::Brittany [name] is 58 year old and here for follow-up.     ***      ROS  As above      Assessment and Plan:  I personally spent *** total minutes in face-to-face and non-face-to-face activities related to the patient's visit today, excluding interpretation services and any separately reportable services/procedures    There are no diagnoses linked to this encounter.      There are no Patient Instructions on file for this visit.    No follow-ups on file.    OBJECTIVE:: LMP 08/19/2019   Physical Exam  General Appearance: ***, alert, no distress, pleasant affect, cooperative.  Heart:  normal rate and regular rhythm, no murmurs, clicks, or gallops.  Lungs: clear to auscultation.  Abdomen: BS normal.  Abdomen soft, non-tender.  No masses or hepatosplenomegaly.  Extremities:  no edema.    Labs and chart reviewed.    Lab Results   Component Value Date    WBC 6.2 02/28/2024    RBC 4.50 02/28/2024    HGB 14.0 02/28/2024    HCT 42.2 02/28/2024    MCV 93.8 02/28/2024    MCHC 33.2 02/28/2024    RDW 11.9 (L) 02/28/2024    PLT 194 02/28/2024    MPV 12.0 02/28/2024       Lab Results   Component Value Date    NA 138 02/28/2024    K 4.5 02/28/2024    CL 102 02/28/2024    BICARB 23 02/28/2024    BUN 12 02/28/2024    CREAT 0.80 02/28/2024    GLU 85 02/28/2024    CA 10.0 02/28/2024       Lab Results   Component Value Date    AST 25 02/28/2024    ALT 27 02/28/2024    LDH 186 (H) 07/23/2023    ALK 77  02/28/2024    TP 7.1 02/28/2024    ALB 4.6 02/28/2024    TBILI 1.63 (H) 02/28/2024    DBILI 0.3 (H) 08/02/2022       Lab Results   Component Value Date    CHOL 171 07/23/2023    HDL 63 07/23/2023    LDLCALC 90 07/23/2023    TRIG 97 07/23/2023          Problem list was updated and reviewed today as indicated.    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    Inadequate exercise - not at goal 06/29/2021    Chronic midline low back pain without sciatica 10/17/2015    Hyperlipidemia, unspecified hyperlipidemia type 09/08/2015    Impingement syndrome, shoulder, right 05/02/2015    Radial styloid tenosynovitis 05/02/2015    Allergic rhinitis due to pollen 04/11/2014    Gastroesophageal  reflux disease without esophagitis 04/11/2014    Bilateral low back pain without sciatica 09/21/2013    Tension headache 12/26/2011    Gluten intolerance 09/17/2011    Dyspepsia 07/15/2011    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    Past Medical History[1]    Social History:  none     Socioeconomic History    Marital status: Divorced   Occupational History    Occupation: technical brewer   Tobacco Use    Smoking status: Former     Current packs/day: 0.00     Types: Cigarettes     Quit date: 10/19/1998     Years since quitting: 25.4     Passive exposure: Past    Smokeless tobacco: Never    Tobacco comments:     2nd hand smoke   Substance and Sexual Activity    Alcohol use: Yes      Alcohol/week: 3.0 standard drinks of alcohol     Types: 3 Glasses of wine per week    Drug use: No    Sexual activity: Not Currently     Partners: Male     Birth control/protection: Condom   Other Topics Concern    Military Service No    Blood Transfusions No    Caffeine Concern No    Occupational Exposure No    Hobby Hazards No    Sleep Concern Yes    Stress Concern Yes    Weight Concern No    Special Diet Yes     Comment: Gluten sensitivity, mother and sister are Celiac    Back Care Yes    Exercises Regularly Yes    Bike Helmet Use Yes    Seat Belt Use Yes    Performs Self-Exams No   Social History Narrative    Divorced and no kids. 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     2020: Sister died of endometrial cancer at 37 yo.       Allergies:  Allergies[2]     acetaminophen  Take 2 tablets (650 mg) by mouth every 4 hours as needed for Mild Pain (Pain Score 1-3).      albuterol  Inhale 2 puffs by mouth every 6 hours as needed for Wheezing. 18 g 0    azelastine  Spray 1 spray into each nostril 2 times daily. Use in each nostril as directed 90 mL 3    CALCIUM -MAGNESIUM -ZINC  PO       cetirizine  Take 1 tablet (10 mg) by mouth daily. 90 tablet 3    Cholecalciferol (VITAMIN D3 PO) Take 1,000 Int'l Units by mouth daily.      cyclobenzaprine  Take 1 tablet (5 mg) by mouth 3 times daily as needed for Muscle Spasms. 30 tablet 0    desvenlafaxine  Take 1 tablet (50 mg) by mouth daily. 90 tablet 4    desvenlafaxine  Succinate ER  Take 1 tablet (25 mg) by mouth daily. 90 tablet 4    diclofenac  Apply 4 g topically 4 times daily as needed (pain). 1 each 3    fluticasone  propionate Spray 1 spray into each nostril 2 times daily. 1 bottle 5    levothyroxine  Take 1 tablet (100 mcg) by mouth every morning (before breakfast). 90 tablet 3    LORazepam  Take 1 tablet 30 to 90 minutes before procedure. May take another 0.5 tablet or 1 tablet after 30 to 60 minutes if incomplete response. 2 tablet 0     metroNIDAZOLE  Apply 1 Application topically daily. Use a small amount as directed 1 each 11    pantoprazole Take 1 tablet (40 mg) by mouth every morning (before breakfast). 30 tablet 0    rosuvastatin  Take 1 tablet (10 mg) by mouth daily. 90 tablet 3    sucralfate Take 1 tablet (1 g) by mouth 3 times daily for 14 days. 42 tablet 0    tretinoin  Apply a thin layer at bedtime as directed 1 Tube 3         Barriers to learning assessed: None.  Patient verbalizes understanding and is agreeable to above plan.          [1]   Past Medical History:  Diagnosis Date    Hypothyroidism     h/o hyperthyroidism s/p radioactive iodine    Infectious mononucleosis 08/19/2006    Insomnia     Menorrhagia    [2]   Allergies  Allergen Reactions    Vicodin [Hydrocodone-Acetaminophen ] Hallucinations

## 2024-03-15 ENCOUNTER — Encounter (INDEPENDENT_AMBULATORY_CARE_PROVIDER_SITE_OTHER): Payer: Self-pay | Admitting: Hospital

## 2024-03-15 ENCOUNTER — Encounter (INDEPENDENT_AMBULATORY_CARE_PROVIDER_SITE_OTHER): Admitting: Student in an Organized Health Care Education/Training Program

## 2024-03-19 ENCOUNTER — Encounter (INDEPENDENT_AMBULATORY_CARE_PROVIDER_SITE_OTHER): Payer: Self-pay | Admitting: Student in an Organized Health Care Education/Training Program

## 2024-03-19 ENCOUNTER — Ambulatory Visit (INDEPENDENT_AMBULATORY_CARE_PROVIDER_SITE_OTHER): Admitting: Student in an Organized Health Care Education/Training Program

## 2024-03-19 VITALS — BP 107/66 | HR 79 | Temp 97.0°F | Ht 69.0 in | Wt 215.0 lb

## 2024-03-19 DIAGNOSIS — Z1389 Encounter for screening for other disorder: Secondary | ICD-10-CM

## 2024-03-19 DIAGNOSIS — K219 Gastro-esophageal reflux disease without esophagitis: Secondary | ICD-10-CM

## 2024-03-19 DIAGNOSIS — Z1339 Encounter for screening examination for other mental health and behavioral disorders: Secondary | ICD-10-CM

## 2024-03-19 DIAGNOSIS — R1010 Upper abdominal pain, unspecified: Secondary | ICD-10-CM

## 2024-03-19 MED ORDER — FAMOTIDINE 20 MG OR TABS
20.0000 mg | ORAL_TABLET | Freq: Two times a day (BID) | ORAL | 0 refills | Status: AC | PRN
Start: 2024-03-19 — End: ?

## 2024-03-19 NOTE — Patient Instructions (Addendum)
 Please do H pylori test, then start omeprazole  20 mg daily 30 min before breakfast. If needed for breakthrough symptoms, you can additionally take Pepcid 20 mg up to twice a day as needed.

## 2024-03-19 NOTE — Progress Notes (Signed)
 Chief Complaint   Patient presents with    Abdominal Pain     gastric pain x month & get pneumonia vaccine       SUBJECTIVE::Brittany [name] is 58 year old and here for follow-up.     Seen in ED on 02/19/24 for abd pain and black stool. Hgb 13.7. Stool brown on smear. She was started on pantoprazole 40 mg daily for possible ulcer. She tried to take pantoprazole but did not agree with her or her thyroid  medication.  Then seen at [facility] on 02/28/24 for epigastric pain and persistent reflux-induced vs URI-related cough. Lipase normal. Given Rx for sucralfate. H pylori stool test ordered.     Feeling better than she did 10/7 and 10/9.  Reflux has resolved with sucralate. Has been taking sucralfate since 02/27/24 and almost out of it. Taking it an hour after thyroid  medication and doesn't eat for an hour after that.  Some bloating.  Omeprazole  had been prescribed by me in 12/2023. Was taking Pepcid AC as needed, but not taking omeprazole .  Had been taking Ibuprofen  1-2 pills twice a week prior to 10/2 ED visit for dorsal calcaneal spur.   Was drinking coke zero.  Does not drink coffee. Green tea 2 cups a day.  Has cut out salsa and cut out tomatoes since onset of sxs.  No ETOH.  Intermittent tightness sensation under the ribs. Improves with lying down and notices it more with standing up but does not worsen with exertion. Denies chest pain or pressure.    ROS  As above      Assessment and Plan:  [name] was seen today for abdominal pain.    Diagnoses and all orders for this visit:    Gastroesophageal reflux disease, unspecified whether esophagitis present  -     famotidine (PEPCID) 20 MG tablet; Take 1 tablet (20 mg) by mouth 2 times daily as needed (GERD).    Screened negative for alcohol use    Screened negative for drug use    Upper abdominal pain      GERD vs gastritis vs PUD. Symptoms are improving. She will complete course of sucralfate. Recommend transitioning to PPI and will try omeprazole  as she has tolerated this in  the past. See already has prior Rx. See pt instructions. Discussed triggers for GERD and continuing to avoid these. Has appnt with GI on 04/01/24.    Patient Instructions   Please do H pylori test, then start omeprazole  20 mg daily 30 min before breakfast. If needed for breakthrough symptoms, you can additionally take Pepcid 20 mg up to twice a day as needed.      OBJECTIVE:: BP 107/66 (BP Location: Left arm, BP Patient Position: Sitting, BP cuff size: Large)   Pulse 79   Temp 97 F (36.1 C) (Temporal)   Ht 5' 9 (1.753 m)   Wt 97.5 kg (215 lb)   LMP 08/19/2019   SpO2 97%   BMI 31.75 kg/m   Physical Exam  General Appearance: well-appearing, alert, in no distress, and pleasant   Heart:  normal rate and regular rhythm, no murmurs, clicks, or gallops.  Abdomen: BS normal.  Abdomen soft. Mild epigastric tenderness.  No rebound or guarding.    Labs and chart reviewed.    Lab Results   Component Value Date    WBC 6.2 02/28/2024    RBC 4.50 02/28/2024    HGB 14.0 02/28/2024    HCT 42.2 02/28/2024    MCV 93.8 02/28/2024  MCHC 33.2 02/28/2024    RDW 11.9 (L) 02/28/2024    PLT 194 02/28/2024    MPV 12.0 02/28/2024       Lab Results   Component Value Date    NA 138 02/28/2024    K 4.5 02/28/2024    CL 102 02/28/2024    BICARB 23 02/28/2024    BUN 12 02/28/2024    CREAT 0.80 02/28/2024    GLU 85 02/28/2024    CA 10.0 02/28/2024       Lab Results   Component Value Date    AST 25 02/28/2024    ALT 27 02/28/2024    LDH 186 (H) 07/23/2023    ALK 77 02/28/2024    TP 7.1 02/28/2024    ALB 4.6 02/28/2024    TBILI 1.63 (H) 02/28/2024    DBILI 0.3 (H) 08/02/2022       Lab Results   Component Value Date    CHOL 171 07/23/2023    HDL 63 07/23/2023    LDLCALC 90 07/23/2023    TRIG 97 07/23/2023          Problem list was updated and reviewed today as indicated.    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    Inadequate exercise - not at goal 06/29/2021    Chronic midline low back pain  without sciatica 10/17/2015    Hyperlipidemia, unspecified hyperlipidemia type 09/08/2015    Impingement syndrome, shoulder, right 05/02/2015    Radial styloid tenosynovitis 05/02/2015    Allergic rhinitis due to pollen 04/11/2014    Gastroesophageal reflux disease without esophagitis 04/11/2014    Bilateral low back pain without sciatica 09/21/2013    Tension headache 12/26/2011    Gluten intolerance 09/17/2011    Dyspepsia 07/15/2011    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    Past Medical History[1]    Social History:  none     Socioeconomic History    Marital status: Divorced   Occupational History    Occupation: technical brewer   Tobacco Use    Smoking status: Former     Current packs/day: 0.00     Types: Cigarettes     Quit date: 10/19/1998     Years since quitting: 25.4     Passive exposure: Past    Smokeless tobacco: Never    Tobacco comments:     2nd hand smoke   Substance and Sexual Activity    Alcohol use: Yes     Alcohol/week: 3.0 standard drinks of alcohol     Types: 3 Glasses of wine per week    Drug use: No    Sexual activity: Not Currently     Partners: Male     Birth control/protection: Condom   Other Topics Concern    Military Service No    Blood Transfusions No  Caffeine Concern No    Occupational Exposure No    Hobby Hazards No    Sleep Concern Yes    Stress Concern Yes    Weight Concern No    Special Diet Yes     Comment: Gluten sensitivity, mother and sister are Celiac     Back Care Yes    Exercises Regularly Yes    Bike Helmet Use Yes    Seat Belt Use Yes    Performs Self-Exams No   Social History Narrative    Divorced and no kids.  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     2020: Sister died of endometrial cancer at 55 yo.     Social Drivers of Health From This Encounter     Intimate Partner Violence: Low Risk  (03/19/2024)    [facility] IPV     Have you ever been emotionally or physically abused by your partner or someone important to you?: No     Within the last year have you been hit, slapped, kicked or otherwise physically hurt by someone?: No     Within the last year has anyone forced you to have sexual activities?: No     Are you afraid of your spouse or partner you listed above?: No       Allergies:  Allergies[2]     acetaminophen  Take 2 tablets (650 mg) by mouth every 4 hours as needed for Mild Pain (Pain Score 1-3).      albuterol  Inhale 2 puffs by mouth every 6 hours as needed for Wheezing. 18 g 0    azelastine  Spray 1 spray into each nostril 2 times daily. Use in each nostril as directed 90 mL 3    CALCIUM -MAGNESIUM -ZINC  PO       cetirizine  Take 1 tablet (10 mg) by mouth daily. 90 tablet 3    Cholecalciferol (VITAMIN D3 PO) Take 1,000 Int'l Units by mouth daily.      cyclobenzaprine  Take 1 tablet (5 mg) by mouth 3 times daily as needed for Muscle Spasms. 30 tablet 0    desvenlafaxine  Take 1 tablet (50 mg) by mouth daily. 90 tablet 4    desvenlafaxine  Succinate ER Take 1 tablet (25 mg) by mouth daily. 90 tablet 4    diclofenac  Apply 4 g topically 4 times daily as needed (pain). 1 each 3    fluticasone  propionate Spray 1 spray into each nostril 2 times daily. 1 bottle 5    levothyroxine  Take 1 tablet (100 mcg) by mouth every morning (before breakfast). 90 tablet 3    LORazepam  Take 1 tablet 30 to 90 minutes before procedure. May take another 0.5 tablet or 1 tablet after 30 to 60 minutes if incomplete response. 2 tablet 0     metroNIDAZOLE  Apply 1 Application topically daily. Use a small amount as directed 1 each 11    pantoprazole Take 1 tablet (40 mg) by mouth every morning (before breakfast). 30 tablet 0    rosuvastatin  Take 1 tablet (10 mg) by mouth daily. 90 tablet 3    sucralfate Take 1 tablet (1 g) by mouth 3 times daily for 14 days. (Patient taking differently: Take 1 tablet (1 g) by mouth 2 times daily.) 42 tablet 0    tretinoin  Apply a thin layer at bedtime as directed 1 Tube 3         Barriers to learning assessed: None.  Patient verbalizes understanding and is agreeable to above plan.           [  1]   Past Medical History:  Diagnosis Date    Hypothyroidism     h/o hyperthyroidism s/p radioactive iodine    Infectious mononucleosis 08/19/2006    Insomnia     Menorrhagia    [2]   Allergies  Allergen Reactions    Vicodin [Hydrocodone-Acetaminophen ] Hallucinations

## 2024-03-21 ENCOUNTER — Encounter (INDEPENDENT_AMBULATORY_CARE_PROVIDER_SITE_OTHER): Payer: Self-pay | Admitting: Student in an Organized Health Care Education/Training Program

## 2024-03-21 ENCOUNTER — Other Ambulatory Visit (INDEPENDENT_AMBULATORY_CARE_PROVIDER_SITE_OTHER)

## 2024-03-21 ENCOUNTER — Encounter (INDEPENDENT_AMBULATORY_CARE_PROVIDER_SITE_OTHER): Payer: Self-pay

## 2024-03-21 ENCOUNTER — Telehealth (INDEPENDENT_AMBULATORY_CARE_PROVIDER_SITE_OTHER): Payer: Self-pay

## 2024-03-21 DIAGNOSIS — R1013 Epigastric pain: Secondary | ICD-10-CM | POA: Insufficient documentation

## 2024-03-21 NOTE — Addendum Note (Signed)
 Addended by: SHONA BOUCHARD on: 03/21/2024 11:16 AM     Modules accepted: Orders

## 2024-03-21 NOTE — Telephone Encounter (Signed)
 Patient came to [facility] to drop off stool speciment but the order placed by Duwaine Hurst on 02/28/24 was ordered as a [employer]or, not a [employer]. Please change or place a new order for the test to be a Lab Collection so we can process the specimen.

## 2024-03-21 NOTE — Interdisciplinary (Signed)
 Stool drop off

## 2024-03-22 LAB — H. PYLORI ANTIGEN: H. Pylori Antigen: NOT DETECTED

## 2024-04-01 ENCOUNTER — Encounter (INDEPENDENT_AMBULATORY_CARE_PROVIDER_SITE_OTHER): Payer: Self-pay | Admitting: Hospital

## 2024-04-01 ENCOUNTER — Telehealth (INDEPENDENT_AMBULATORY_CARE_PROVIDER_SITE_OTHER): Admitting: Physician Assistant

## 2024-04-01 VITALS — Ht 70.0 in | Wt 215.0 lb

## 2024-04-01 DIAGNOSIS — K219 Gastro-esophageal reflux disease without esophagitis: Secondary | ICD-10-CM

## 2024-04-01 DIAGNOSIS — R1010 Upper abdominal pain, unspecified: Secondary | ICD-10-CM

## 2024-04-01 DIAGNOSIS — R1013 Epigastric pain: Secondary | ICD-10-CM

## 2024-04-01 NOTE — Patient Instructions (Addendum)
 Continue omeprazole  20 mg once daily, 30 minutes before breakfast x 6-8 weeks  Then taper to every other day x 2-4 weeks  Then use as needed    Continue famotidine daily, then use as needed    Please call [facility] Scheduling at [phone] to schedule your appointment for Abdominal Ultrasound at your earliest convenience. In order to obtain the most accurate insurance approval, we secure authorization after you are registered and scheduled for imaging services.  Please have your insurance cards available when scheduling your exam.     Please call [phone]  to schedule your formal consultation with any of our GI specialists.

## 2024-04-01 NOTE — Progress Notes (Signed)
 ---------------------(data below generated by Harlene Macario Daring, PA)--------------------    Patient Verification & Telemedicine On-boarding Consent:    I am proceeding with this evaluation at the direct request of the patient.  I have verified this is the correct patient and have obtained verbal consent and written consent from the patient/ surrogate to perform this voluntary telemedicine evaluation (including obtaining history, performing examination and reviewing data provided by the patient).   The patient/ surrogate has the right to refuse this evaluation.  I have explained risks (including potential loss of confidentiality), benefits, alternatives, and the potential need for subsequent face to face care. Patient/ surrogate understands that there is a risk of medical inaccuracies given that our recommendations will be made based on reported data (and we must therefore assume this information is accurate).  Knowing that there is a risk that this information is not reported accurately, and that the telemedicine video, audio, or data feed may be incomplete, the patient agrees to proceed with evaluation and holds us  harmless knowing these risks. In this evaluation, we will be providing recommendations only.  The ultimate decision to follow, or not follow, these recommendations will be left to the bedside treating/ requesting practitioner.  The patient/ surrogate has been notified that other healthcare professionals (including students, residents and engineer, maintenance) may be involved in this audio-video evaluation.   All laws concerning confidentiality and patient access to medical records and copies of medical records apply to telemedicine.  The patient/ surrogate has received the [facility] Notice of Privacy Practices.  I have reviewed this above verification and consent paragraph with the patient/ surrogate.  If the patient is not capacitated to understand the above, and no surrogate is available, since this is not  an emergency evaluation, the visit will be rescheduled until such time that the patient can consent, or the surrogate is available to consent.    Visit practitioner:  Harlene Macario Daring, PA PA-C    Date / Time: 04/01/2024 7:24 AM    Referring Provider: Woodard Josetta Norlander     Expedited Care Consultation: Patient has been informed that purpose of visit is to undergo brief review of current symptoms to fast track care to the most appropriate GI testing and/or consultation.  Explained that upon completion of GI work up, ongoing management for chronic GI symptoms will be managed by their excellent Primary Care Provider.     Brief HPI /  Reason for Visit:   Chief Complaint   Patient presents with    New Patient     Brittany [name] is a 58 year old female with PMH of hypothyroidism, gilbert's, gluten intolerance who presents to [facility] triage visit, referred for dyspepsia, ED follow up. ED 02/19/24 for melena, work up with Hgb (13.7) and FOBT negative for blood. Associated with heartburn, acid reflux, upper abdominal pain. Was discharged with pantoprazole, but this caused side effects. She went to UC and was given sucralfate, took this for 3 weeks with some improvement. She then followed up with PCP 03/19/24, discontinued sucralfate, h pylori stool testing negative, then started omeprazole  and famotidine. Has been on these daily for 11 days, and bland diet with significant improvement in symptoms, GERD has resolved, continues to have constant upper abdominal discomfort under ribs, worse when sitting down.     Denies RB, melena, constipation, diarrhea, n/v, dysphagia, wt loss    Relevant Labs / Imaging / Prior GI Procedures    H pylori stool Ag 03/21/24- negative    Colonoscopy 04/24/19  ENDOSCOPIC DIAGNOSIS  1. No polyps present on today's exam.   2. Pan-colonic diverticulosis, most dense in the left colon.   3. Small internal hemorrhoids.   RECOMMENDATIONS   1. Fiber rich diet, with goal 25-30 grams fiber intake per  day.   2. Avoid prolonged sitting or straining with bowel movements.   3. Your next colonoscopy can take place in ten years.    07/31/2011 EGD for dyspepsia  Endoscopic Diagnosis:   Normal esophagus and GE junction.  Normal stomach. Biopsies taken to evaluate for H pylori.   Normal duodenum. Biopsies taken to evaluate for celiac disease.    Pathology results:   FINAL PATHOLOGIC DIAGNOSIS:  A: Duodenum, biopsy       -Duodenal mucosa with no significant histopathology.  B: Stomach, biopsy       -Mild chronic inactive gastritis.       -No evidence of Helicobacter organisms.      Family History  Mother with Celiac  Sister with Celiac  Sister with Crohn's  Denies family history gastric or colon cancer    Brief Exam  Limited due to phone/video consultation   Gen: speaking in complete sentences    Plan:     Continue omeprazole  daily x 6-8 weeks, then taper  Abdominal US   F/u after triage work up for formal GI consultation, any gen GI    Electronically Signed:   Harlene Macario Daring, PA-C  [facility]  Division of Gastroenterology  04/01/2024  7:24 AM

## 2024-04-05 NOTE — Progress Notes (Deleted)
 HPI: GLC Suspect  - Here for HVF 24-2 OU, DFE  OU, OCT OU, and IOP Check    - GLC DFE 04/29/2024 ***    Past Ocular History:  OD OS   (+) GLC Suspect  (+) Mild Cataracts (+) GLC Suspect  (+) Mild Cataracts  (+) Retinal Lattice       Tmax   < 22    22    Ttarget < -    -    Pachy < 544    558       Glaucoma Risk Factors  IOP>22, Family HX, Disc appearance , and High myopia (>6D)    Discontinued / Contraindicated GLC Medications  None    Family Ocular History  (+) GLC - Mother, Father, Sister  (+) RD - Sister    Relevant Past Medical History  hyperlipidemia, hypothyroid, arthritis, pre-diabetes  _________________________________________________________________    Glaucoma Testing    HVF  (04/29/2024) ***  OD (24-2, stim III): Borderline reliable, full, Inferior scatter, stable. FP 11%. PD >TD.  OS (24-2, stim III): Reliable, full, Inferior scatter, stable. PD > TD.    OCT RNFL  (04/29/2024) ***  OD: Superior  Thinning, stable. myopic temp bundle shift  OS: Borderline, Inferior  Thinning, stable. myopic temp bundle shift    OCT GCIPL  (04/29/2024) ***  OD: Borderline, Diffuse  Thinning, stable.  OS: Borderline, Diffuse  Thinning, stable.    EXTENDED OPHTHALMOSCOPY AND DRAWING INTERPRETATION  (04/29/2024) ***  OD: No DH. Retina flat.  OS: No DH. Retina flat. Nasal & Inf Retinal lattice.   _________________________________________________________________      Assessment and Plan    Glaucoma Suspect OU (IOP, +FHx, ON, Myopia)  Current medications: Refresh ATs prn    No GLC gtts   IOP acceptable for now OD, acceptable for now OS  Clinical Exam / OCT / HVF stable OU ***  Pt edu on findings.     Plan: Monitor for now ***    RTC in 6 months with HVF 24-2 OU, OCT OU, and IOP Check  ***      Trace Cataracts  - Pt edu on findings.  - Not visually significant at this time.  - Update MRx in optom clinic as needed. Monitor.    Peripheral CRA / Pavingstones OU  Retinal Lattice OS  PVD OS, non-acute  - Pt edu on findings  -  Monitor    RD precautions given, rtc STAT if occurs  1. Increase in flashing lights  2. Increase in floaters  3. Curtain or shadow over vision

## 2024-04-06 ENCOUNTER — Encounter (HOSPITAL_BASED_OUTPATIENT_CLINIC_OR_DEPARTMENT_OTHER): Payer: Self-pay | Admitting: Hospital

## 2024-04-22 ENCOUNTER — Ambulatory Visit (HOSPITAL_BASED_OUTPATIENT_CLINIC_OR_DEPARTMENT_OTHER)

## 2024-04-25 ENCOUNTER — Other Ambulatory Visit (INDEPENDENT_AMBULATORY_CARE_PROVIDER_SITE_OTHER): Payer: Self-pay | Admitting: Obstetrics & Gynecology

## 2024-04-25 DIAGNOSIS — N951 Menopausal and female climacteric states: Secondary | ICD-10-CM

## 2024-04-26 ENCOUNTER — Telehealth (HOSPITAL_BASED_OUTPATIENT_CLINIC_OR_DEPARTMENT_OTHER): Payer: Self-pay

## 2024-04-26 ENCOUNTER — Encounter (HOSPITAL_BASED_OUTPATIENT_CLINIC_OR_DEPARTMENT_OTHER): Payer: Self-pay | Admitting: Hospital

## 2024-04-26 NOTE — Telephone Encounter (Signed)
 Pt's last visit was a [facility] on 07/01/2023. Pt is requesting a refill for the Pristiq . It is on her list twice one is 25 mg tablet and 50 mg tablet. Please review and advise    Thank you, Altamese HERO

## 2024-04-26 NOTE — Telephone Encounter (Addendum)
 Called patient left detailed voicemail to reschedule apt with Vince left my direct number sent My chart message

## 2024-04-27 ENCOUNTER — Encounter (INDEPENDENT_AMBULATORY_CARE_PROVIDER_SITE_OTHER): Payer: Vision Other Private Insurance | Admitting: Optometrist

## 2024-04-27 ENCOUNTER — Ambulatory Visit (INDEPENDENT_AMBULATORY_CARE_PROVIDER_SITE_OTHER): Admitting: Emergency Medicine

## 2024-04-27 ENCOUNTER — Encounter (INDEPENDENT_AMBULATORY_CARE_PROVIDER_SITE_OTHER): Payer: Self-pay | Admitting: Emergency Medicine

## 2024-04-27 ENCOUNTER — Ambulatory Visit (INDEPENDENT_AMBULATORY_CARE_PROVIDER_SITE_OTHER): Payer: Self-pay | Admitting: Nurse Practitioner

## 2024-04-27 ENCOUNTER — Encounter (INDEPENDENT_AMBULATORY_CARE_PROVIDER_SITE_OTHER): Payer: Self-pay | Admitting: Dermatology

## 2024-04-27 VITALS — BP 119/79 | HR 90 | Temp 97.7°F | Resp 17

## 2024-04-27 DIAGNOSIS — J029 Acute pharyngitis, unspecified: Secondary | ICD-10-CM

## 2024-04-27 DIAGNOSIS — K1370 Unspecified lesions of oral mucosa: Secondary | ICD-10-CM

## 2024-04-27 DIAGNOSIS — L989 Disorder of the skin and subcutaneous tissue, unspecified: Secondary | ICD-10-CM

## 2024-04-27 DIAGNOSIS — L578 Other skin changes due to chronic exposure to nonionizing radiation: Secondary | ICD-10-CM

## 2024-04-27 DIAGNOSIS — J0100 Acute maxillary sinusitis, unspecified: Secondary | ICD-10-CM

## 2024-04-27 DIAGNOSIS — J069 Acute upper respiratory infection, unspecified: Secondary | ICD-10-CM

## 2024-04-27 LAB — RAPID STREP A NAAT (POCT): Rapid Strep A NAAT (POCT): NEGATIVE

## 2024-04-27 LAB — RAPID INFLUENZA A&B NAAT (POCT)
Influenza A, Rapid: NOT DETECTED
Influenza B, Rapid: NOT DETECTED

## 2024-04-27 LAB — COVID-19 RAPID NAAT (POCT): COVID-19 Rapid Assay (POCT): NOT DETECTED

## 2024-04-27 MED ORDER — AMOXICILLIN-POT CLAVULANATE 875-125 MG OR TABS: 1.0000 | ORAL_TABLET | Freq: Two times a day (BID) | ORAL | 0 refills | Status: AC

## 2024-04-27 MED ORDER — BENZONATATE 200 MG OR CAPS: 200.0000 mg | ORAL_CAPSULE | Freq: Three times a day (TID) | ORAL | 0 refills | Status: AC | PRN

## 2024-04-27 MED ORDER — CHLORHEXIDINE GLUCONATE 0.12 % MT SOLN: 15.0000 mL | Freq: Two times a day (BID) | OROMUCOSAL | 0 refills | Status: AC

## 2024-04-27 MED ORDER — LIDOCAINE VISCOUS 2 % MT SOLN: 5.0000 mL | OROMUCOSAL | 0 refills | Status: AC | PRN

## 2024-04-27 NOTE — Progress Notes (Signed)
 Chief Complaint:   Chief Complaint   Patient presents with    Cough     -Day 6 of Severe sore throat, cough & cold. Yellow discharge from nose and cough. Home test for Covid/flu on Dec. 5 was negative. - Entered by patient  -need doctor note for work       Case summary is as follows: Brittany [name] is a 58 year old female with chief c/o cough with mucus production, clear rhinorrhea. No fever at home. Taking Robitussin DM and Tylenol . Saline rinse, Airborne, on allergy medications. The patient is also noting that she has had some unilateral maxillary pressure and right-sided  sinus congestion. She also accidentally bit the left side of her tongue and has a small lesion there which is causing her some discomfort.      Past Medical History[1]  Past Surgical History[2]        Review of Systems -  Pertinent review of systems is obtained and otherwise negative except for as stated in the HPI        Physical Examination:    04/27/24  0826   BP: 119/79   Pulse: 90   Temp: 97.7 F (36.5 C)   Resp: 17   SpO2: 98%     Vital signs noted    General:  Well appearing, no distress  HEENT: Normocephalic, atraumatic. Eyes: PERRL. No conjunctival injection. Right sided maxillary TTP. Boggy right-sided nasal passages. Left lower part of her tongue with small laceration.   Posterior pharynx with mild erythema no exudates. Uvula is midline.  Chest: Lungs CTAB, respirations are non-labored.  Heart:  RRR, no murmurs.  No peripheral edema, no cyanosis  Extremities:  Patient moves upper and lower extremities with full range of motion.  No obvious deformity.  Neuro:  Patient is awake and alert   Skin: No visible rashes    Medical Decision Making:  I considered a broad differential diagnosis including but not limited to the following:      Results:  I ordered and independently interpreted the following diagnostic studies:    Results for orders placed or performed in visit on 04/27/24   Rapid Strep A NAAT (POCT)   Result Value Ref Range     Rapid Strep A NAAT (POCT) Negative Negative   Covid-19 Rapid NAAT (POCT)   Result Value Ref Range    COVID-19 Rapid Assay (POCT) Not Detected Not Detected   Rapid Influenza A&B NAAT (POCT)   Result Value Ref Range    Influenza A, Rapid Not Detected Not Detected    Influenza B, Rapid Not Detected Not Detected           Impression:      ICD-10-CM ICD-9-CM    1. Upper respiratory tract infection, unspecified type  J06.9 465.9       2. Acute non-recurrent maxillary sinusitis  J01.00 461.0 benzonatate  (TESSALON ) 200 MG capsule      amoxicillin -clavulanate (AUGMENTIN ) 875-125 MG tablet      chlorhexidine  (PERIDEX ) 0.12 % solution      3. Oral lesion  K13.70 528.9 chlorhexidine  (PERIDEX ) 0.12 % solution      4. Pharyngitis, unspecified etiology  J02.9 462 lidocaine  (XYLOCAINE ) 2 % solution          Imaging Results if applicable:  No results found.        Plan:   Brittany [name] is a 58 year old female with  A cough sinus pressure sore throat and congestion. Her symptoms  have present now for over 6 days and have not been improving. Suspect viral upper respiratory tract infection however given the constellation of symptoms and some unilateral symptoms as well we also would consider bacterial sinusitis. The patient is advised to continue with nasal saline rinses and Flonase . If her symptoms persist beyond 7-10 days we may consider adding a antibiotic to cover for bacterial sinusitis if her symptoms do not improve. In the interim she is also prescribed benzonatate  for cough. She did have  a painful lesion on her left tongue where she accidentally bit herself. I have prescribed chlorhexidine  mouth rinses and  viscous lidocaine .    Follow up plans and return/ED precautions were provided.  The patient was specifically counseled to follow up carefully with primary care and or seek urgent medical evaluation if symptoms do not improve or worsen. The patient/caregiver agrees with plan of care and verbalized understanding.        Portions of this encounter were used with voice recognition software. Some errors, erroneous grammar and misrepresented words may be reflected in the dictation.          [1]   Past Medical History:  Diagnosis Date    Hypothyroidism     h/o hyperthyroidism s/p radioactive iodine    Infectious mononucleosis 08/19/2006    Insomnia     Menorrhagia    [2]   Past Surgical History:  Procedure Laterality Date    left wrist tendon repair  05/20/1996    CHOLECYSTECTOMY, LAP  05/20/1993    PB REMOVE TONSILS/ADENOIDS,<12 Y/O  05/20/1973    CHOLECYSTECTOMY  1997    PB MYOMECTOMY 1-4 MYOMAS 250 GM/< VAGINAL APPR      wisdom teeth

## 2024-04-28 ENCOUNTER — Encounter (INDEPENDENT_AMBULATORY_CARE_PROVIDER_SITE_OTHER): Payer: Vision Other Private Insurance

## 2024-04-28 ENCOUNTER — Ambulatory Visit: Admission: RE | Admit: 2024-04-28 | Discharge: 2024-04-28 | Attending: Physician Assistant

## 2024-04-28 DIAGNOSIS — R1013 Epigastric pain: Secondary | ICD-10-CM | POA: Insufficient documentation

## 2024-04-28 DIAGNOSIS — R1010 Upper abdominal pain, unspecified: Secondary | ICD-10-CM | POA: Insufficient documentation

## 2024-04-29 ENCOUNTER — Ambulatory Visit (INDEPENDENT_AMBULATORY_CARE_PROVIDER_SITE_OTHER): Admitting: Optometrist

## 2024-04-29 ENCOUNTER — Encounter (INDEPENDENT_AMBULATORY_CARE_PROVIDER_SITE_OTHER)

## 2024-04-29 DIAGNOSIS — R1010 Upper abdominal pain, unspecified: Secondary | ICD-10-CM

## 2024-04-29 DIAGNOSIS — H2513 Age-related nuclear cataract, bilateral: Secondary | ICD-10-CM

## 2024-04-29 DIAGNOSIS — H35412 Lattice degeneration of retina, left eye: Secondary | ICD-10-CM

## 2024-04-29 DIAGNOSIS — H40003 Preglaucoma, unspecified, bilateral: Secondary | ICD-10-CM

## 2024-04-29 DIAGNOSIS — Z9049 Acquired absence of other specified parts of digestive tract: Secondary | ICD-10-CM

## 2024-04-29 DIAGNOSIS — H43812 Vitreous degeneration, left eye: Secondary | ICD-10-CM

## 2024-05-02 ENCOUNTER — Other Ambulatory Visit (INDEPENDENT_AMBULATORY_CARE_PROVIDER_SITE_OTHER): Payer: Self-pay | Admitting: Student in an Organized Health Care Education/Training Program

## 2024-05-02 DIAGNOSIS — J309 Allergic rhinitis, unspecified: Secondary | ICD-10-CM

## 2024-05-03 ENCOUNTER — Ambulatory Visit (INDEPENDENT_AMBULATORY_CARE_PROVIDER_SITE_OTHER): Payer: Self-pay | Admitting: Physician Assistant

## 2024-05-04 ENCOUNTER — Ambulatory Visit: Admitting: Nurse Practitioner

## 2024-05-04 ENCOUNTER — Ambulatory Visit (HOSPITAL_BASED_OUTPATIENT_CLINIC_OR_DEPARTMENT_OTHER)

## 2024-05-06 NOTE — Progress Notes (Signed)
 [facility]   [address] [place] [facility] [place], [place] [address]  [phone]     Primary MD: Gala Spear Nabil  Consult Requested By: Vergie Glatter    HISTORY:  Brittany [name] is a 58 year old female with {Form:26841::a history of ***,a history of NMSC,a history of melanoma,a family history of skin cancer,a family history of melanoma,no history of skin cancer} here for {Form:26841::a spot check evaluation.,a follow-up evaluation of ***,an evaluation of ***,a TBSE due to sun damaged skin.}    LOV on 05/07/2023:  - Unremarkable exam     Lesion of concern today:   1.) The patient brings attention to ***    Otherwise denies other new itching, bleeding, painful, growing, ulcerating, or concerning lesions.    Works in occupational hygienist for [employer]: 1 major sunburn with peeling as a child   Sun Protection:  intermittent  Melanoma hx: negative   NMSC hx: negative   Family Hx of melanoma: negative   Family hx of skin disease: positive - mother had 3 NMSC (she did not use sunscreen), father with 1 NMSC    FH:   - ***    SH:   - ***     PMH: reviewed    Medications: reviewed    Allergies: Vicodin [hydrocodone-acetaminophen ]    REVIEW OF SYSTEMS:  DERMATOLOGIC: no other skin complaints.  Constitutional: feels well, no concerns     PHYSICAL EXAM:  General Appearance: within normal limits  Neuro: Alert and oriented x 3  Psych: Mood and affect within normal limits  Eyes: Inspection of lids, sclera, and conjunctiva within normal limits   Areas skin examined included: scalp, forehead, eyebrows, cheeks, nose, lips, chin, ears, sclera, conjunctiva, eyelids, neck, RUE, LUE, hands, chest, abdomen, back, RLE, LLE, feet, buttock, fingernails and toenails ***  ***-- Exam of {Form:26841::toenails and fingernails,fingernails,toenails} limited by presence of nail polish  ***-- Exam of face limited by presence of makeup    Pertinent findings below:  -***   -gritty erythematous macules of  the ***  -crusted stuck on papule of the ***   -scattered even-bordered, even-pigmented macules and papules on the head, trunk and extremities  -feathery brown macules on the face, forearms and shoulders  -well demarcated, brown, stuck on appearing plaques on head, trunk and extremities  -scattered 2-4 mm cherry red macules  -yellow papules with central dell on the face ***  -pink brown papule with + dimple sign on the ***  -with well healed surgical scar(s), no nodularity or pigment seen within scar(s)***  -dyschromia, thinning, loss of elasticity, and wrinkling of the skin in sun exposed areas    ASSESSMENT AND TREATMENT PLAN:    Neoplasm of uncertain behavior skin  - Site: ***  - Biopsy done: see below    Actinic keratosis  - Site: *** (Total: ***)  - Discussed with patient the premalignant nature of these lesions  - RTC if does not resolve with LN2    Inflamed Seborrheic Keratosis  - Site: *** (Total: ***)  - Discussed with patient the benign but irritated nature of these lesions  - RTC if does not resolve with LN2    Multiple Benign Nevi  Lentigines  Seborrheic Keratoses  Cherry angioma  Sebaceous hyperplasia ***  Dermatofibroma***  - benign lesions reassured    History of NMSC ***Melanoma   Scar  Sun damaged skin  - Educational information about sun protection and skin cancers provided in handout, broad spectrum SPF 30  or higher  - RTC if notices any new or concerning growths  - No clinical evidence of recurrence at prior scar(s)***    - Diagnoses, natural course, potential treatments and risks were discussed with the patient. Shared decision making. Questions answered.    RTC in {Form:26841::12 months for TBSE,6 months for TBSE,*** months for ***}  PRN sooner as needed    Beauford Hancock, MD, PhD    ------------------------  ***Biopsy Procedure Note: Neoplasm of uncertain behavior skin  Written and verbal, witnessed informed consent was obtained. Risks of biopsy discussed including bleeding, infection,  scar, dyspigmentation, pain, recurrence, nerve damage. A time out period was observed to identify the correct location of the lesion(s), name of procedure, patient's name and DOB.  Each biopsy site was prepped with an alcohol pad.  Each area was infiltrated with 0.5 cc of 0.5% lidocaine  with 1:100,000 epinephrine.  A shave biopsy was perfomed using a dermablade.  Hemostasis achieved with DrySol.  The biopsy site(s) was allowed to heal by secondary intention.  The patient was advised to clean each area with soap and water, apply petrolatum ointment afterwards, and to cover with a bandaid daily until healed.  Patient was advised to call our office for any complications.  The pathology results will be discussed with the patient when they become available. Patient verbally consents to answer machine or mychart communication of biopsy results.  Ultimate treatment plan pending biopsy results.     ***LN2 Procedure Note  After discussion of treatment options and risks, benefits, alternatives of cryotherapy, patient was verbally consented for treatment with liquid nitrogen cryotherapy including atrophy, scar, dyspigmentation, pain, recurrence and incomplete treatment.  Liquid nitrogen x 5-7 seconds x 2 cycles to each lesion.  Tolerated without complication. Expectations of some redness, blistering discussed. Patient was instructed not pick at the areas. Instructed patient to return if lesions fail to fully resolve.       Scribe Attestation  The notes I am recording reflect only actions made by and judgments taken by this provider, Dr. Hancock, for whom I am scribing today. I have performed no independent clinical work.    Ozell Skelton    ____________________________________________________________________    {Double click to enter provider attestation for scribed [id number]}

## 2024-05-07 ENCOUNTER — Ambulatory Visit (INDEPENDENT_AMBULATORY_CARE_PROVIDER_SITE_OTHER): Payer: BLUE CROSS/BLUE SHIELD | Admitting: Dermatology

## 2024-05-07 ENCOUNTER — Ambulatory Visit (HOSPITAL_BASED_OUTPATIENT_CLINIC_OR_DEPARTMENT_OTHER)

## 2024-05-07 ENCOUNTER — Ambulatory Visit
Admission: RE | Admit: 2024-05-07 | Discharge: 2024-05-07 | Disposition: A | Discharge status: Home / Self Care | Attending: Nurse Practitioner | Admitting: Nurse Practitioner

## 2024-05-07 ENCOUNTER — Ambulatory Visit: Admitting: Nurse Practitioner

## 2024-05-07 DIAGNOSIS — L814 Other melanin hyperpigmentation: Secondary | ICD-10-CM

## 2024-05-07 DIAGNOSIS — D1801 Hemangioma of skin and subcutaneous tissue: Secondary | ICD-10-CM

## 2024-05-07 DIAGNOSIS — R92333 Mammographic heterogeneous density, bilateral breasts: Secondary | ICD-10-CM

## 2024-05-07 DIAGNOSIS — L578 Other skin changes due to chronic exposure to nonionizing radiation: Secondary | ICD-10-CM

## 2024-05-07 DIAGNOSIS — Z1231 Encounter for screening mammogram for malignant neoplasm of breast: Secondary | ICD-10-CM

## 2024-05-07 DIAGNOSIS — D229 Melanocytic nevi, unspecified: Secondary | ICD-10-CM

## 2024-05-07 DIAGNOSIS — Z803 Family history of malignant neoplasm of breast: Secondary | ICD-10-CM | POA: Insufficient documentation

## 2024-05-07 DIAGNOSIS — L821 Other seborrheic keratosis: Secondary | ICD-10-CM

## 2024-05-11 NOTE — Progress Notes (Signed)
 HPI: GLC Suspect  - Here for HVF 24-2 OU, DFE OU, OCT OU, and IOP Check    - GLC DFE 06/04/2024     Past Ocular History:  OD OS   (+) GLC Suspect  (+) Mild Cataracts (+) GLC Suspect  (+) Mild Cataracts  (+) Retinal Lattice       Tmax   < 22    22    Ttarget < -    -    Pachy < 544    558       Glaucoma Risk Factors  IOP>22, Family HX, Disc appearance , and High myopia (>6D)    Discontinued / Contraindicated GLC Medications  None    Family Ocular History  (+) GLC - Mother, Father, Sister  (+) RD - Sister    Relevant Past Medical History  hyperlipidemia, hypothyroid, arthritis, pre-diabetes  _________________________________________________________________    Glaucoma Testing    HVF  (06/04/2024)   OD (24-2, stim III): Unreliable, full, Superior , Inferior scatter, EBS, stable. FP 20%. PD >TD.  OS (24-2, stim III): Borderline reliable, Inferior scatter, stable. PD > TD.    OCT RNFL  (06/04/2024)   OD: Superior  Thinning, stable. myopic temp bundle shift.   OS: Borderline, Inferior  Thinning, stable. myopic temp bundle shift. ?mild sup decrease.     OCT GCIPL  (06/04/2024)   OD: Borderline, Diffuse  Thinning, stable.  OS: Borderline, Diffuse  Thinning, stable.    EXTENDED OPHTHALMOSCOPY AND DRAWING INTERPRETATION  (06/04/2024)   OD: No DH. Retina flat.  OS: No DH. Retina flat. Nasal & Inf Retinal lattice.   _________________________________________________________________      Assessment and Plan    Glaucoma Suspect OU (IOP, +FHx, ON, Myopia)  Current medications: Refresh ATs prn    No GLC gtts   IOP acceptable for now OD, acceptable for now OS  Clinical Exam / OCT / HVF stable OU   Pt edu on findings.     Plan: Monitor for now     RTC in 6 months with HVF 24-2 OU, OCT OU, and IOP Check        Trace Cataracts  - Pt edu on findings.  - Not visually significant at this time.  - Update MRx in optom clinic as needed. Monitor.    Peripheral CRA / Pavingstones OU  Retinal Lattice OS  PVD OU, non-acute  - Pt edu on findings  -  Monitor    RD precautions given, rtc STAT if occurs  1. Increase in flashing lights  2. Increase in floaters  3. Curtain or shadow over vision

## 2024-05-26 NOTE — Patient Instructions (Signed)
 [facility] PHONE LIST FOR PATIENTS  Hours of operation: Monday-Friday 8:00 - 5:00pm, Closed Holidays and Weekends      AFTER HOURS EMERGENCY NUMBER: [phone] Ask for On-Call Surgeon for Surgical Symptoms. As for [facility] Oncologist for Medical Symptoms     Admin Assistant: Rosaline Croissant for Dr. Arlean Shutter:   [facility]: [phone] FAX: [phone]    Admin Assistant: Shanda Jubilee for Nurse Practitioner Fabiene Rondo: [phone]     Nurse Case Managers for Dr. Arlean Shutter and Nurse Practitioner Fabiene Rondo:   Kristie KIDD., RN @ [phone]  Melanee ORN., RN @ [phone]  Wyona SAUNDERS., RN @ [phone]    Social Worker Phone: [phone]     Tell Us  How We Did During Your Consultation/Follow Up Visit...  Your feedback goes a long way and makes a difference. If you would like to provide us  feedback on how we did via telephone or  Email  E-mail: [email or web address] Phone: [phone]      Thank you for the opportunity to care for you during this time.

## 2024-05-26 NOTE — Interdisciplinary (Unsigned)
 Return Patient: Follow up for high risk screening    CC: Family history of breast CA, lifetime risk 24.7%    05/07/24 SCREENING MAMMOGRAM WITH DIGITAL BREAST TOMOSYNTHESIS FINDINGS:  The breasts are heterogeneously dense, which may obscure small masses.     There are no masses, asymmetries, or suspicious calcifications.     IMPRESSION / RECOMMENDATION:  There is no mammographic evidence of breast cancer.     Normal interval follow-up mammogram in 1 year is recommended.     ASSESSMENT:  BI-RADS Category 1:  Negative    10/24/23 BREAST MRI IMPRESSION:  Technically inadequate secondary to lack of signal in the left breast in the T2 sequence and in the pre and dynamic post contrast sequences.     No evidence of malignancy in the right breast.     No axillary lymphadenopathy.     No adenopathy in the right internal mammary lymph node chain. The left internal mammary lymph node chain can not be evaluated.     Recommendations: Repeat breast MRI at no additional cost to the patient secondary to technical issues.     BI-RADS category 1, negative right breast.     BI-RADS category 0, incomplete left breast.    Symptom-Focused Nursing Assessment: LV 07/2023 - was given raloxifene handout but was advised first to work on coming off HRT when possible.     Seen by NP not RN.        Fall Risk             Fall Risk Interventions:   No fall risk identified; no interventions at this time     Education provided to patient/caregiver on fall risk factors and precautions.      Nutrition        Nutrition Interventions:  MST score < or = 1, referral not indicated, no additional questions or concerns     Education provided on importance of early nutrition intervention for improved symptom management and outcomes, with in-person or telehealth visit and family involvement.     PHQ      12/22/2023     1:04 PM 05/02/2023    11:27 AM 08/01/2022     4:15 PM 06/28/2021    10:05 PM 10/06/2020     1:57 PM 05/16/2020    11:31 AM 01/24/2020     3:42 PM   PHQ2  QUESTIONNAIRE   Little interest or pleasure in doing things 0 1 0 0 0  0 0    Feeling down, depressed, or hopeless 0 0 1 0 1  0 1   Trouble falling or staying asleep, or sleeping too much 1 1 1 1 1 1  1     Feeling tired or having little energy 0 1 1 1 1 1 1    Poor appetite or overeating 0 0 0 0 0 0 0   Feeling bad about yourself--or that you are a failure to have let yourself or your family down 0 0 0 0 0 0 0   Trouble concentrating on things, such as reading the newspaper or watching television 0 0 0 0 0 0 0   Moving or speaking so slowly that other people could have noticed.  Or the opposite--being so fidgety or restless that you have been moving around a lot more than usual 0 0 0 0 0 0 0   Thoughts that you would be better off dead, or of hurting yourself in some way 0 0 0 0 0 0 0  If you checked off any problems, how difficult have these problems made it for you to do your work, take care of things at home, or get along with other people?  Not difficult at all Not difficult at all Somewhat difficult Somewhat difficult Not difficult at all Not difficult at all   PHQ9 Patient Summary Score (calculated) 1 3 3 2 3 2 3        Proxy-reported    Data saved with a previous flowsheet row definition      No issues identified, no interventions at this time.    Wellbeing Screening    Screening      No questionnaires available.                        Wellbeing Screening Interventions: No issues identified; no interventions at this time    Patient not on active oral cancer-directed therapy at this time    Pain        Pain interventions:    No intervention needed at this time    Language: English           Learning Needs Assessment:      Learner: Patient   Challenges to learning: None   Readiness to learn: Acceptance   Method: Explanation    Patient Education:   New cancer-directed therapy teaching:No    Education Materials:  No written or electronic educational materials provided for this visit     Educated on plan of care,  office contact, and AVS using teach-back method. Questions answered to patient/caregiver satisfaction.      Patient/caregiver verbalized understanding      Plan of care:   - CEM due June  - BMP for CEM  - RTC after CEM

## 2024-05-28 ENCOUNTER — Ambulatory Visit: Attending: Nurse Practitioner | Admitting: Nurse Practitioner

## 2024-05-28 VITALS — BP 128/83 | HR 91 | Temp 96.3°F | Resp 16 | Ht 70.0 in | Wt 210.4 lb

## 2024-05-28 DIAGNOSIS — Z9189 Other specified personal risk factors, not elsewhere classified: Secondary | ICD-10-CM | POA: Insufficient documentation

## 2024-05-29 NOTE — Progress Notes (Signed)
 Medical Record #: [id number]   DOB: Apr 06, 1966    Reason for Visit  Chief Complaint   Patient presents with    Family History Of Cancer        History of Present Illness:     Brittany [name] is a 59 year old female who is here for Family History Of Cancer    [name] returns to clinic for high risk f/u. Since her initial visit she was able to stop systemic HRT and notes she has been doing generally well.    As discussed at her initial visit:  Pt presents to clinic for consult to discuss her family hx of breast cancer and recommendations for screening and prevention. She notes her mother and sister had a hx of breast cancer in their 55s. She notes her mom when she had recurrence in her 41s completed BRCA testing only but not panel testing. She notes her sister never had testing. She denies any symptoms of the breast including pain, erythema, nipple d/c, axillary fullness.     Her medical hx and risk factors as follows:  Menarche: 13  LMP:  52  Pregnancies: 0  HX Birth Control: yes  HX HRT: vivelle  patch and progesterone  to help with hot flashes and insomnia, stopped after previous visit  Breast Implants/Type: denies  Hx of Breast Biopsy/FNA:  0  Smoking: denies  Alcohol: social  Currently working?: yes  Ashkenazi Ancestry? denies  Genetic Testing? Denies, since mother was negative no one else in the family had testing     Med/ Sx Hx: hypothyroid, pre-diabetes, chole, myomectomy, left wrist tendon repair      Family History of Breast Cancer:  Mother - 82s recurred in 56s negative for BRCA 9 years ago  Sister - 32s DCIS   Paternal Aunt     Family History of Cancer:  Sister- endometrial cancer - 15      Past Medical History:   Diagnosis Date    Hypothyroidism     h/o hyperthyroidism s/p radioactive iodine    Infectious mononucleosis 08/19/2006    Insomnia     Menorrhagia          Past Surgical History:   Procedure Laterality Date    left wrist tendon repair  05/20/1996    CHOLECYSTECTOMY, LAP  05/20/1993    PB REMOVE  TONSILS/ADENOIDS,<12 Y/O  05/20/1973    CHOLECYSTECTOMY  1997    PB MYOMECTOMY 1-4 MYOMAS 250 GM/< VAGINAL APPR      wisdom teeth           Family History   Problem Relation Name Age of Onset    Breast Cancer Mother Brittany [name] - deceased 52    Cancer Mother Brittany [name] - deceased         Stage 4 breast cancer    Hypertension Father Mr. SHANAYE RIEF - deceased     Other Sister Brittany [name] - deceased         Uterine fibroids/hysterectomy    Crohn's Disease Sister Brittany [name] - deceased     Cancer Sister Brittany [name] - deceased         Endometrial cancer    Breast Cancer Sister Brittany [name] 40        DCIS    Hypertension Sister Brittany [name]         Hypertension / obese    Endometrial Cancer Sister Brittany [name]     Hypertension Sister      Cholesterol/Lipid Disorder Sister  Heart Disease Sister Brittany [name]         Stent    Diabetes Sister Brittany [name]         Adult onset    Hypertension Sister Brittany [name]     Thyroid  Sister Brittany [name]     Breast Cancer P Aunt      Ovarian Cancer Neg Hx          but sister w/ovarian cysts age 49       Allergies  Allergies   Allergen Reactions    Vicodin [Hydrocodone-Acetaminophen ] Hallucinations          acetaminophen  Take 2 tablets (650 mg) by mouth every 4 hours as needed for Mild Pain (Pain Score 1-3).      albuterol  Inhale 2 puffs by mouth every 6 hours as needed for Wheezing. 18 g 0    azelastine  Spray 1 spray into each nostril 2 times daily. Use in each nostril as directed 90 mL 3    CALCIUM -MAGNESIUM -ZINC  PO       cetirizine  Take 1 tablet (10 mg) by mouth daily. 90 tablet 3    chlorhexidine  15 mL by Mouth/Throat route 2 times daily. 1 each 0    Cholecalciferol (VITAMIN D3 PO) Take 1,000 Int'l Units by mouth daily.      cyclobenzaprine  Take 1 tablet (5 mg) by mouth 3 times daily as needed for Muscle Spasms. 30 tablet 0    desvenlafaxine  TAKE 1 TABLET BY MOUTH once DAILY 90 tablet 0    desvenlafaxine  Succinate ER Take 1 tablet (25 mg) by mouth daily. 90 tablet 0    diclofenac   Apply 4 g topically 4 times daily as needed (pain). 1 each 3    famotidine  Take 1 tablet (20 mg) by mouth 2 times daily as needed (GERD). 180 tablet 0    fluticasone  propionate Spray 1 spray into each nostril 2 times daily. 1 bottle 5    levothyroxine  Take 1 tablet (100 mcg) by mouth every morning (before breakfast). 90 tablet 3    lidocaine  Take 5 mL by mouth as needed for Pain (q 6 hours pain). 120 mL 0    LORazepam  Take 1 tablet 30 to 90 minutes before procedure. May take another 0.5 tablet or 1 tablet after 30 to 60 minutes if incomplete response. 2 tablet 0    metroNIDAZOLE  Apply 1 Application topically daily. Use a small amount as directed 1 each 11    rosuvastatin  Take 1 tablet (10 mg) by mouth daily. 90 tablet 3    tretinoin  Apply a thin layer at bedtime as directed 1 Tube 3         Socioeconomic History    Marital status: Divorced   Occupational History    Occupation: technical brewer   Tobacco Use    Smoking status: Former     Current packs/day: 0.00     Types: Cigarettes     Quit date: 10/19/1998     Years since quitting: 25.6     Passive exposure: Past    Smokeless tobacco: Never    Tobacco comments:     2nd hand smoke   Substance and Sexual Activity    Alcohol use: Yes     Alcohol/week: 3.0 standard drinks of alcohol     Types: 3 Glasses of wine per week    Drug use: No    Sexual activity: Not Currently     Partners: Male     Birth control/protection: Condom   Social Activities  of Daily Living Present    Military Service No    Blood Transfusions No    Caffeine Concern No    Occupational Exposure No    Hobby Hazards No    Sleep Concern Yes    Stress Concern Yes    Weight Concern No    Special Diet Yes     Comment: Gluten sensitivity, mother and sister are Celiac    Back Care Yes    Exercises Regularly Yes    Bike Helmet Use Yes    Seat Belt Use Yes    Performs Self-Exams No   Social History Narrative    Divorced and no kids. 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      2020: Sister died of endometrial cancer at 70 yo.     Social History     Tobacco Use   Smoking Status Former    Current packs/day: 0.00    Types: Cigarettes    Quit date: 10/19/1998    Years since quitting: 25.6    Passive exposure: Past   Smokeless Tobacco Never   Tobacco Comments    2nd hand smoke     Social History     Substance and Sexual Activity   Alcohol Use Yes    Alcohol/week: 3.0 standard drinks of alcohol    Types: 3 Glasses of wine per week     Social History     Substance and Sexual Activity   Drug Use No       Problem List  Patient Active Problem List   Diagnosis    Benign neoplasm of skin, site unspecified    Vitamin D  deficiency    Grave's disease    Right foot pain    Acquired hypothyroidism    Insomnia    Sebaceous cyst    Depression    Fatigue    Palpitations    Skin lesion    Hearing loss    Cerumen impaction    Right knee pain    Dyspepsia    Intestinal gas excretion    Gluten intolerance    Tension headache    Bilateral low back pain without sciatica    Allergic rhinitis due to pollen    Gastroesophageal reflux disease without esophagitis    Impingement syndrome, shoulder, right    Radial styloid tenosynovitis    Hyperlipidemia, unspecified hyperlipidemia type    Chronic midline low back pain without sciatica    Inadequate exercise - not at goal    [place] syndrome       Review of Systems    Pertinent items are noted in HPI.    Physical Exam  Patient is a 59 year old female who appeared alert, cooperative, no distress  BP 128/83 (BP Location: Right arm, BP Patient Position: Sitting, BP cuff size: Large)   Pulse 91   Temp 96.3 F (35.7 C) (Temporal)   Resp 16   Ht 5' 10 (1.778 m)   Wt 95.4 kg (210 lb 6.4 oz)   LMP 08/19/2019   SpO2 98%   BMI 30.19 kg/m   Body mass index is 30.19 kg/m.  General Appearance: healthy, alert, no distress, pleasant affect, cooperative.  Neck:  Neck supple. No adenopathy, thyroid  symmetric, normal size.  Breast:  normal in size and symmetry, normal contour  with no evidence of flattening or dimpling, skin normal, nipples everted without rashes or discharge, palpation negative for masses or nodules with diffuse fibrocystic tissue throughout.  No noted lymphadenopathy.  Abdomen: Abdomen soft, non-tender. No masses or organomegaly. Bowel sounds normal.  Skin:  negative.    Medical Decision Making  Test Results:  Results for orders placed or performed in visit on 04/27/24   Rapid Strep A NAAT (POCT)   Result Value Ref Range    Rapid Strep A NAAT (POCT) Negative Negative   Covid-19 Rapid NAAT (POCT)   Result Value Ref Range    COVID-19 Rapid Assay (POCT) Not Detected Not Detected   Rapid Influenza A&B NAAT (POCT)   Result Value Ref Range    Influenza A, Rapid Not Detected Not Detected    Influenza B, Rapid Not Detected Not Detected     Reading Physician Reading Date Result Priority   Arn Hahn, MD  [phone]  [phone]  10/27/2023      Narrative & Impression  EXAM DESCRIPTION:  Bilateral breast MRI without and with contrast dated 10/24/2023.     CLINICAL HISTORY:  High-risk screening. Family history includes: Mother with breast cancer at age 35. No reported family history of ovarian cancer. Menarche at age 67. The patient is nulliparous. Not tested for the breast cancer gene mutations.     TECHNIQUE:  PULSE SEQUENCES: Multiplanar, multisequence MR images of the breast were obtained on a 3.0 Tesla superconducting GE Premier MRI imaging scanner. Coronal T2. Pre-contrast axial T2 fat-saturated, pre-contrast non-fat saturated T1, and one pre- and six post-contrast fat-saturated images were obtained of both breasts together with the breast coil. Post-processed subtraction and MIP reconstructions were then generated. Dynamic images were spatially registered using the [employer], and dynamic curves and parametric images were evaluated.     As part of the dynamic portion of this examination, 9.5 mL of Gadavist  was administered intravenously at  a rate of 2 mL/sec, without complication.     Field of view for the dynamic portion of this study was 40 cm.     COMPARISON:  Mammogram dated 04/30/2023. Breast MRI dated 03/10/2017.     FINDINGS:  The technical quality of this study is considered inadequate to make a final assessment and recommendation based on lack of signal in the entire left breast.     There is heterogeneous fibroglandular tissue bilaterally and mild background parenchymal enhancement.     Coronal T2 sequences demonstrates normal bilateral axillary nodes.     Axial T2 sequence demonstrates no cysts or fluid collections in the right breast. The left breast is technically inadequate with lack of T2 signal.     Non fat saturated T1 sequence demonstrates no fat containing lesions.     Delayed post contrast sequence demonstrates no internal mammary lymphadenopathy in the right breast. The left breast signal inhomogeneity limits evaluation.     Regarding the right breast: There are no masses, architectural distortion or suspicious areas of enhancement.     Regarding the left breast: Technically inadequate secondary to lack of signal.              Signed by: Arn Hahn 10/27/2023 08:09:55  IMPRESSION:  IMPRESSION:  Technically inadequate secondary to lack of signal in the left breast in the T2 sequence and in the pre and dynamic post contrast sequences.     No evidence of malignancy in the right breast.     No axillary lymphadenopathy.     No adenopathy in the right internal mammary lymph node chain. The left internal mammary lymph node chain can not be evaluated.     Recommendations: Repeat  breast MRI at no additional cost to the patient secondary to technical issues.     BI-RADS category 1, negative right breast.     BI-RADS category 0, incomplete left breast.    Reading Physician Reading Date Result Priority   Cleven Murders, MD, MPH  [phone]  [phone]  05/07/2024      Narrative & Impression  HISTORY:  Patient is seen for  screening.     STUDIES COMPARED:  The present examination has been compared to prior imaging studies performed at [facility] on 04/02/2021, 04/25/2022, 04/30/2023 and 10/24/2023.     TECHNIQUE:  The following mammographic views were obtained: bilateral craniocaudal with tomosynthesis, bilateral mediolateral oblique with tomosynthesis, and bilateral synthetic views.     Technologist: Harlene Masker     SCREENING MAMMOGRAM WITH DIGITAL BREAST TOMOSYNTHESIS FINDINGS:  The breasts are heterogeneously dense, which may obscure small masses.     There are no masses, asymmetries, or suspicious calcifications.     IMPRESSION / RECOMMENDATION:  There is no mammographic evidence of breast cancer.     Normal interval follow-up mammogram in 1 year is recommended.     ASSESSMENT:  BI-RADS Category 1:  Negative     Electronically signed by Dr. Murders Cleven    Impression:  Jochebed has a family hx of breast cancer. We discussed the different risk factors which can contribute to breast health changes and the tools used to determine risk. By the Alisa model her risk was found to be:      5--Year Risk of Developing Breast Cancer  Patient Risk  4.5%  Average Risk  1.6%  Based on the information provided, the patient's estimated risk for developing invasive breast cancer over the next 5 years is 4.5%, presented in red since hers is higher than the average risk of 1.6% (presented in blue) for women of the same age and race/ethnicity in the general [place] population.  Lifetime Risk of Developing Breast Cancer  Patient Risk  24.7%  Average Risk  9.8%  Based on the information provided, the woman's estimated risk for developing invasive breast cancer over her lifetime (to age 26) is 24.7%, presented in red since hers is higher than the average risk of 9.8% (presented in blue) for women of the same age and race/ethnicity in the general [place] population.      We reviewed the NCCN guidelines for high risk which  includes annual mammograms, clinical exams and the consideration of breast MRI.     Because of the patient' history and dense breast pattern, I would recommend an MRI. The details of MRI imaging were fully explained. MRI utilizes an injection of gadavist , an imaging agent, that is picked up by cancer cells differently than benign tissue. Therefore, a kinetic curve and a picture are obtained that see things differently than mammogram. It is very useful for detecting breast cancers in the high risk female. There is a very high false positive rate, however, when doing an MRI. This means that many women will be called back in for additional imaging - ultrasound, etc., and even biopsy of tissue that will not turn out to be cancer. However, in high risk women, there is up to a 15% incidence in finding new breast cancers that other screening did not find. And this can change surgical management. An MRI can be very time consuming. Some women become claustrophobic during it. And there may be follow up exams necessary. MRI can also slow done the  time to get the patient's surgery scheduled.    As noted above the MRI completed previously did not show her other breast well and a repeat was recommended, but she had a difficult time with the study and really would like to avoid this.   We discussed her option of instead trying a CEM. She is eager to try this instead of the MRI.     Tamoxifen for prevention was discussed with the patient. The details of the NSABP prevention trial showing a 49% reduction in breast cancer versus placebo with the use of tamoxifen was explained. For patients with atypia and LCIS, that reduction was over 70%. Tamoxifen also positively modulates the bones and so protects against osteopenia. Risks of tamoxifen include DVT, PE, stroke, cataracts, hot flashes, ovarian cysts, change in periods, vaginal dryness, depression and in the post menopausal patient, uterine cancer. Raloxifene, or Evista, is now  preferred by the FDA for postmenopausal patients as there is no increased risk of uterine cancer. Other risks and benefits are the same as tamoxifen. We also discussed the use of exesmestane which was shown to also reduce risk of breast cancer. It is an aromatase inhibitor used ONLY for postmenopausal patients has very few grave side effects. However because of the complete blockade of estrogen its use can an lead to osteopenia, joint pain, severe sexual dysfunction, hair loss, hot flashes, etc. I would favor raloxifene due to the protection of bone density and the rare side effects.     Raloxifene 60mg  daily is now preferred by the FDA based on NSABP STAR trial (P-2) for post-menopausal women. Raloxifene behaves as estrogen receptor antagonist in the breast and uterus, but acts as an agonist in the bone. Raloxifene is considered equally effective in reducing risk of invasive breast cancer. Non-invasive disease was better reduced with tamoxifen vs raloxifene, although this difference was not statistically signification (RR 1.4). Notably, the rates for uterine cancer, DVT/PE, and cataracts were all less with raloxifene. It also generally tolerated better than tamoxifen. Other risks of other cancers, stroke, fracture, and ischemic heart disease are similar for both drugs.     Therapy is recommended for 5 years. There is no data to suggest additional benefit for prevention beyond 5 years.       She will plan for CEM in June and RTC at least annually for high risk f/u or sooner if needed.

## 2024-06-04 ENCOUNTER — Ambulatory Visit (INDEPENDENT_AMBULATORY_CARE_PROVIDER_SITE_OTHER)

## 2024-06-04 ENCOUNTER — Ambulatory Visit (INDEPENDENT_AMBULATORY_CARE_PROVIDER_SITE_OTHER): Admitting: Optometrist

## 2024-06-04 DIAGNOSIS — H35412 Lattice degeneration of retina, left eye: Secondary | ICD-10-CM

## 2024-06-04 DIAGNOSIS — H40003 Preglaucoma, unspecified, bilateral: Secondary | ICD-10-CM

## 2024-06-04 DIAGNOSIS — H43812 Vitreous degeneration, left eye: Secondary | ICD-10-CM

## 2024-06-04 DIAGNOSIS — H2513 Age-related nuclear cataract, bilateral: Secondary | ICD-10-CM

## 2024-06-14 ENCOUNTER — Encounter (INDEPENDENT_AMBULATORY_CARE_PROVIDER_SITE_OTHER): Payer: Self-pay | Admitting: Student in an Organized Health Care Education/Training Program

## 2024-06-14 DIAGNOSIS — K219 Gastro-esophageal reflux disease without esophagitis: Secondary | ICD-10-CM

## 2024-06-14 MED ORDER — OMEPRAZOLE 20 MG OR CPDR: 20.0000 mg | DELAYED_RELEASE_CAPSULE | Freq: Every day | ORAL | 1 refills | Status: AC

## 2024-06-14 NOTE — Telephone Encounter (Signed)
 Established with:  Last OV with PCP:  Next OV with Dept: Brittany [name]   03/19/2024  Visit date not found     Medication requested:   Requested Prescriptions     Pending Prescriptions Disp Refills    omeprazole  (PRILOSEC) 20 MG capsule 90 capsule 0     Sig: Take 1 capsule (20 mg) by mouth every morning (before breakfast).       Last Filled Date 12/22/23   Quantity Last Filled  90 capsule   Refill 0   Send to:    [id number] - [place], [place] - [address]  [address]  [place] [address]  Phone: [phone] Fax: [phone] Alternate Fax: [phone]    [facility]  [address]  [place] [address]  Phone: [phone] Fax: [phone]      Current Medication(s):   acetaminophen  Take 2 tablets (650 mg) by mouth every 4 hours as needed for Mild Pain (Pain Score 1-3).      albuterol  Inhale 2 puffs by mouth every 6 hours as needed for Wheezing. 18 g 0    azelastine  Spray 1 spray into each nostril 2 times daily. Use in each nostril as directed 90 mL 3    CALCIUM -MAGNESIUM -ZINC  PO       cetirizine  Take 1 tablet (10 mg) by mouth daily. 90 tablet 3    chlorhexidine  15 mL by Mouth/Throat route 2 times daily. 1 each 0    Cholecalciferol (VITAMIN D3 PO) Take 1,000 Int'l Units by mouth daily.      cyclobenzaprine  Take 1 tablet (5 mg) by mouth 3 times daily as needed for Muscle Spasms. 30 tablet 0    desvenlafaxine  TAKE 1 TABLET BY MOUTH once DAILY 90 tablet 0    desvenlafaxine  Succinate ER Take 1 tablet (25 mg) by mouth daily. 90 tablet 0    diclofenac  Apply 4 g topically 4 times daily as needed (pain). 1 each 3    famotidine  Take 1 tablet (20 mg) by mouth 2 times daily as needed (GERD). 180 tablet 0    fluticasone  propionate Spray 1 spray into each nostril 2 times daily. 1 bottle 5    levothyroxine  Take 1 tablet (100 mcg) by mouth every morning (before breakfast). 90 tablet 3    lidocaine  Take 5 mL by mouth as needed for Pain (q 6 hours pain). 120 mL 0    LORazepam   Take 1 tablet 30 to 90 minutes before procedure. May take another 0.5 tablet or 1 tablet after 30 to 60 minutes if incomplete response. 2 tablet 0    metroNIDAZOLE  Apply 1 Application topically daily. Use a small amount as directed 1 each 11    rosuvastatin  Take 1 tablet (10 mg) by mouth daily. 90 tablet 3    tretinoin  Apply a thin layer at bedtime as directed 1 Tube 3       Patient information: Allergies[1]     Health Maintenance Due   Topic Date Due    Pneumococcal Vaccine (1 of 1 - PCV) Never done      Last BP:  Blood Pressure   05/28/24 128/83   04/27/24 119/79   03/19/24 107/66      Last labs:  Results for orders placed or performed in visit on 04/27/24   Rapid Strep A NAAT (POCT)   Result Value Ref Range    Rapid Strep A NAAT (POCT) Negative Negative   Covid-19 Rapid NAAT (  POCT)   Result Value Ref Range    COVID-19 Rapid Assay (POCT) Not Detected Not Detected   Rapid Influenza A&B NAAT (POCT)   Result Value Ref Range    Influenza A, Rapid Not Detected Not Detected    Influenza B, Rapid Not Detected Not Detected     Lab Results   Component Value Date    CHOL 171 07/23/2023    HDL 63 07/23/2023    LDLCALC 90 07/23/2023    TRIG 97 07/23/2023    TSH 0.95 02/28/2024    A1C 5.3 07/23/2023                  [1]   Allergies  Allergen Reactions    Vicodin [Hydrocodone-Acetaminophen ] Hallucinations

## 2024-07-01 ENCOUNTER — Encounter (INDEPENDENT_AMBULATORY_CARE_PROVIDER_SITE_OTHER): Payer: Vision Other Private Insurance

## 2024-07-19 ENCOUNTER — Ambulatory Visit (INDEPENDENT_AMBULATORY_CARE_PROVIDER_SITE_OTHER): Admitting: Obstetrics & Gynecology

## 2024-12-02 ENCOUNTER — Encounter (INDEPENDENT_AMBULATORY_CARE_PROVIDER_SITE_OTHER)

## 2024-12-02 ENCOUNTER — Ambulatory Visit (INDEPENDENT_AMBULATORY_CARE_PROVIDER_SITE_OTHER): Admitting: Optometrist

## 2024-12-30 ENCOUNTER — Ambulatory Visit: Admitting: Nurse Practitioner

## 2025-05-06 ENCOUNTER — Ambulatory Visit (INDEPENDENT_AMBULATORY_CARE_PROVIDER_SITE_OTHER): Admitting: Dermatology
